# Patient Record
Sex: Female | Born: 1935 | Race: White | Hispanic: No | Marital: Married | Smoking: Former smoker
Health system: Southern US, Community
[De-identification: ages and names within clinical notes are randomized; demographics above are authoritative.]

## PROBLEM LIST (undated history)

## (undated) DIAGNOSIS — Z79899 Other long term (current) drug therapy: Secondary | ICD-10-CM

## (undated) DIAGNOSIS — Z950 Presence of cardiac pacemaker: Secondary | ICD-10-CM

## (undated) DIAGNOSIS — G4730 Sleep apnea, unspecified: Secondary | ICD-10-CM

## (undated) DIAGNOSIS — L299 Pruritus, unspecified: Secondary | ICD-10-CM

## (undated) DIAGNOSIS — D649 Anemia, unspecified: Secondary | ICD-10-CM

## (undated) DIAGNOSIS — K219 Gastro-esophageal reflux disease without esophagitis: Secondary | ICD-10-CM

## (undated) DIAGNOSIS — K5790 Diverticulosis of intestine, part unspecified, without perforation or abscess without bleeding: Secondary | ICD-10-CM

## (undated) DIAGNOSIS — Z952 Presence of prosthetic heart valve: Secondary | ICD-10-CM

## (undated) DIAGNOSIS — F419 Anxiety disorder, unspecified: Secondary | ICD-10-CM

## (undated) DIAGNOSIS — I509 Heart failure, unspecified: Secondary | ICD-10-CM

## (undated) DIAGNOSIS — K589 Irritable bowel syndrome without diarrhea: Secondary | ICD-10-CM

## (undated) DIAGNOSIS — I10 Essential (primary) hypertension: Secondary | ICD-10-CM

## (undated) DIAGNOSIS — F329 Major depressive disorder, single episode, unspecified: Secondary | ICD-10-CM

## (undated) DIAGNOSIS — J449 Chronic obstructive pulmonary disease, unspecified: Secondary | ICD-10-CM

## (undated) DIAGNOSIS — Z7901 Long term (current) use of anticoagulants: Secondary | ICD-10-CM

## (undated) DIAGNOSIS — T8859XA Other complications of anesthesia, initial encounter: Secondary | ICD-10-CM

## (undated) DIAGNOSIS — J45909 Unspecified asthma, uncomplicated: Secondary | ICD-10-CM

## (undated) DIAGNOSIS — Z9981 Dependence on supplemental oxygen: Secondary | ICD-10-CM

## (undated) DIAGNOSIS — T4145XA Adverse effect of unspecified anesthetic, initial encounter: Secondary | ICD-10-CM

## (undated) DIAGNOSIS — M1990 Unspecified osteoarthritis, unspecified site: Secondary | ICD-10-CM

## (undated) DIAGNOSIS — I2510 Atherosclerotic heart disease of native coronary artery without angina pectoris: Secondary | ICD-10-CM

## (undated) DIAGNOSIS — Z515 Encounter for palliative care: Secondary | ICD-10-CM

## (undated) DIAGNOSIS — J189 Pneumonia, unspecified organism: Secondary | ICD-10-CM

## (undated) DIAGNOSIS — I4892 Unspecified atrial flutter: Secondary | ICD-10-CM

## (undated) DIAGNOSIS — I4891 Unspecified atrial fibrillation: Secondary | ICD-10-CM

## (undated) HISTORY — PX: CORONARY ARTERY BYPASS GRAFT: SHX141

## (undated) HISTORY — PX: CARDIAC ELECTROPHYSIOLOGY MAPPING AND ABLATION: SHX1292

## (undated) HISTORY — PX: CARDIAC VALVE REPLACEMENT: SHX585

## (undated) HISTORY — PX: EYE SURGERY: SHX253

## (undated) HISTORY — DX: Sleep apnea, unspecified: G47.30

## (undated) HISTORY — PX: OTHER SURGICAL HISTORY: SHX169

## (undated) HISTORY — DX: Unspecified atrial fibrillation: I48.91

## (undated) HISTORY — DX: Pruritus, unspecified: L29.9

## (undated) HISTORY — DX: Presence of prosthetic heart valve: Z95.2

## (undated) HISTORY — DX: Presence of cardiac pacemaker: Z95.0

## (undated) HISTORY — PX: TONSILLECTOMY: SUR1361

## (undated) HISTORY — PX: CORONARY ANGIOPLASTY: SHX604

## (undated) HISTORY — PX: CARDIAC CATHETERIZATION: SHX172

## (undated) HISTORY — DX: Long term (current) use of anticoagulants: Z79.01

## (undated) HISTORY — DX: Dependence on supplemental oxygen: Z99.81

## (undated) HISTORY — DX: Irritable bowel syndrome, unspecified: K58.9

## (undated) HISTORY — PX: INSERT / REPLACE / REMOVE PACEMAKER: SUR710

## (undated) HISTORY — DX: Gastro-esophageal reflux disease without esophagitis: K21.9

## (undated) HISTORY — DX: Unspecified asthma, uncomplicated: J45.909

## (undated) HISTORY — DX: Unspecified atrial flutter: I48.92

## (undated) HISTORY — PX: GANGLION CYST EXCISION: SHX1691

## (undated) HISTORY — DX: Other long term (current) drug therapy: Z79.899

## (undated) HISTORY — DX: Diverticulosis of intestine, part unspecified, without perforation or abscess without bleeding: K57.90

## (undated) HISTORY — PX: APPENDECTOMY: SHX54

---

## 1992-11-05 HISTORY — PX: BACK SURGERY: SHX140

## 1996-03-13 ENCOUNTER — Encounter: Payer: Self-pay | Admitting: Internal Medicine

## 1998-02-03 ENCOUNTER — Encounter (HOSPITAL_COMMUNITY): Admission: RE | Admit: 1998-02-03 | Discharge: 1998-05-04 | Payer: Self-pay | Admitting: Internal Medicine

## 1998-05-05 ENCOUNTER — Encounter (HOSPITAL_COMMUNITY): Admission: RE | Admit: 1998-05-05 | Discharge: 1998-08-03 | Payer: Self-pay | Admitting: Internal Medicine

## 1998-08-04 ENCOUNTER — Encounter (HOSPITAL_COMMUNITY): Admission: RE | Admit: 1998-08-04 | Discharge: 1998-11-02 | Payer: Self-pay | Admitting: Internal Medicine

## 1998-10-10 ENCOUNTER — Other Ambulatory Visit: Admission: RE | Admit: 1998-10-10 | Discharge: 1998-10-10 | Payer: Self-pay | Admitting: *Deleted

## 1998-11-03 ENCOUNTER — Encounter (HOSPITAL_COMMUNITY): Admission: RE | Admit: 1998-11-03 | Discharge: 1999-02-01 | Payer: Self-pay | Admitting: Internal Medicine

## 1998-11-03 ENCOUNTER — Ambulatory Visit (HOSPITAL_COMMUNITY): Admission: RE | Admit: 1998-11-03 | Discharge: 1998-11-03 | Payer: Self-pay | Admitting: *Deleted

## 1998-11-03 ENCOUNTER — Encounter: Payer: Self-pay | Admitting: *Deleted

## 1999-02-02 ENCOUNTER — Encounter (HOSPITAL_COMMUNITY): Admission: RE | Admit: 1999-02-02 | Discharge: 1999-05-03 | Payer: Self-pay | Admitting: Internal Medicine

## 1999-02-09 ENCOUNTER — Encounter: Payer: Self-pay | Admitting: Internal Medicine

## 1999-05-04 ENCOUNTER — Encounter (HOSPITAL_COMMUNITY): Admission: RE | Admit: 1999-05-04 | Discharge: 1999-06-05 | Payer: Self-pay | Admitting: Internal Medicine

## 1999-06-06 ENCOUNTER — Encounter (HOSPITAL_COMMUNITY): Admission: RE | Admit: 1999-06-06 | Discharge: 1999-09-04 | Payer: Self-pay | Admitting: Internal Medicine

## 1999-09-05 ENCOUNTER — Encounter (HOSPITAL_COMMUNITY): Admission: RE | Admit: 1999-09-05 | Discharge: 1999-12-04 | Payer: Self-pay | Admitting: Internal Medicine

## 1999-10-18 ENCOUNTER — Other Ambulatory Visit: Admission: RE | Admit: 1999-10-18 | Discharge: 1999-10-18 | Payer: Self-pay | Admitting: *Deleted

## 1999-11-15 ENCOUNTER — Ambulatory Visit (HOSPITAL_COMMUNITY): Admission: RE | Admit: 1999-11-15 | Discharge: 1999-11-15 | Payer: Self-pay | Admitting: *Deleted

## 1999-11-15 ENCOUNTER — Encounter: Payer: Self-pay | Admitting: *Deleted

## 1999-12-05 ENCOUNTER — Encounter (HOSPITAL_COMMUNITY): Admission: RE | Admit: 1999-12-05 | Discharge: 2000-03-04 | Payer: Self-pay | Admitting: Internal Medicine

## 2000-01-25 ENCOUNTER — Encounter: Payer: Self-pay | Admitting: Internal Medicine

## 2000-03-05 ENCOUNTER — Encounter (HOSPITAL_COMMUNITY): Admission: RE | Admit: 2000-03-05 | Discharge: 2000-04-16 | Payer: Self-pay | Admitting: Internal Medicine

## 2000-10-21 ENCOUNTER — Other Ambulatory Visit: Admission: RE | Admit: 2000-10-21 | Discharge: 2000-10-21 | Payer: Self-pay | Admitting: *Deleted

## 2001-01-08 ENCOUNTER — Ambulatory Visit (HOSPITAL_COMMUNITY): Admission: RE | Admit: 2001-01-08 | Discharge: 2001-01-08 | Payer: Self-pay | Admitting: *Deleted

## 2001-01-08 ENCOUNTER — Encounter: Payer: Self-pay | Admitting: *Deleted

## 2001-01-13 ENCOUNTER — Encounter: Admission: RE | Admit: 2001-01-13 | Discharge: 2001-01-13 | Payer: Self-pay | Admitting: *Deleted

## 2001-01-13 ENCOUNTER — Encounter: Payer: Self-pay | Admitting: *Deleted

## 2001-08-25 ENCOUNTER — Encounter: Payer: Self-pay | Admitting: Internal Medicine

## 2001-08-25 ENCOUNTER — Ambulatory Visit (HOSPITAL_COMMUNITY): Admission: RE | Admit: 2001-08-25 | Discharge: 2001-08-25 | Payer: Self-pay | Admitting: Internal Medicine

## 2002-01-19 ENCOUNTER — Encounter: Admission: RE | Admit: 2002-01-19 | Discharge: 2002-01-19 | Payer: Self-pay | Admitting: *Deleted

## 2002-01-19 ENCOUNTER — Encounter: Payer: Self-pay | Admitting: *Deleted

## 2002-02-10 ENCOUNTER — Encounter: Payer: Self-pay | Admitting: Neurosurgery

## 2002-02-10 ENCOUNTER — Ambulatory Visit (HOSPITAL_COMMUNITY): Admission: RE | Admit: 2002-02-10 | Discharge: 2002-02-10 | Payer: Self-pay | Admitting: Neurosurgery

## 2002-06-16 ENCOUNTER — Inpatient Hospital Stay (HOSPITAL_COMMUNITY): Admission: EM | Admit: 2002-06-16 | Discharge: 2002-06-23 | Payer: Self-pay | Admitting: Internal Medicine

## 2002-06-16 ENCOUNTER — Encounter: Payer: Self-pay | Admitting: Internal Medicine

## 2002-06-17 ENCOUNTER — Encounter: Payer: Self-pay | Admitting: Internal Medicine

## 2002-06-18 ENCOUNTER — Encounter: Payer: Self-pay | Admitting: Cardiology

## 2002-06-18 ENCOUNTER — Encounter: Payer: Self-pay | Admitting: Internal Medicine

## 2002-06-19 ENCOUNTER — Encounter: Payer: Self-pay | Admitting: Internal Medicine

## 2002-06-22 ENCOUNTER — Encounter: Payer: Self-pay | Admitting: Internal Medicine

## 2002-08-03 ENCOUNTER — Ambulatory Visit (HOSPITAL_BASED_OUTPATIENT_CLINIC_OR_DEPARTMENT_OTHER): Admission: RE | Admit: 2002-08-03 | Discharge: 2002-08-03 | Payer: Self-pay | Admitting: Internal Medicine

## 2002-08-24 ENCOUNTER — Encounter: Payer: Self-pay | Admitting: Obstetrics and Gynecology

## 2002-08-24 ENCOUNTER — Encounter: Admission: RE | Admit: 2002-08-24 | Discharge: 2002-08-24 | Payer: Self-pay | Admitting: Obstetrics and Gynecology

## 2002-09-07 ENCOUNTER — Encounter: Admission: RE | Admit: 2002-09-07 | Discharge: 2002-09-07 | Payer: Self-pay | Admitting: Obstetrics and Gynecology

## 2002-09-07 ENCOUNTER — Encounter: Payer: Self-pay | Admitting: Obstetrics and Gynecology

## 2002-11-10 ENCOUNTER — Ambulatory Visit (HOSPITAL_COMMUNITY): Admission: RE | Admit: 2002-11-10 | Discharge: 2002-11-10 | Payer: Self-pay | Admitting: Cardiology

## 2003-08-06 ENCOUNTER — Encounter (HOSPITAL_COMMUNITY): Admission: RE | Admit: 2003-08-06 | Discharge: 2003-10-06 | Payer: Self-pay | Admitting: Internal Medicine

## 2003-10-08 ENCOUNTER — Inpatient Hospital Stay (HOSPITAL_COMMUNITY): Admission: AD | Admit: 2003-10-08 | Discharge: 2003-10-12 | Payer: Self-pay | Admitting: Internal Medicine

## 2003-10-19 ENCOUNTER — Encounter: Admission: RE | Admit: 2003-10-19 | Discharge: 2003-10-19 | Payer: Self-pay | Admitting: Obstetrics and Gynecology

## 2003-10-25 ENCOUNTER — Ambulatory Visit (HOSPITAL_COMMUNITY): Admission: RE | Admit: 2003-10-25 | Discharge: 2003-10-25 | Payer: Self-pay | Admitting: Pulmonary Disease

## 2003-11-09 ENCOUNTER — Encounter: Payer: Self-pay | Admitting: Internal Medicine

## 2003-12-02 ENCOUNTER — Encounter (HOSPITAL_COMMUNITY): Admission: RE | Admit: 2003-12-02 | Discharge: 2004-03-01 | Payer: Self-pay | Admitting: Internal Medicine

## 2004-01-11 ENCOUNTER — Ambulatory Visit (HOSPITAL_COMMUNITY): Admission: RE | Admit: 2004-01-11 | Discharge: 2004-01-11 | Payer: Self-pay | Admitting: Pulmonary Disease

## 2004-03-05 ENCOUNTER — Encounter (HOSPITAL_COMMUNITY): Admission: RE | Admit: 2004-03-05 | Discharge: 2004-06-03 | Payer: Self-pay | Admitting: Internal Medicine

## 2004-06-06 ENCOUNTER — Encounter (HOSPITAL_COMMUNITY): Admission: RE | Admit: 2004-06-06 | Discharge: 2004-09-04 | Payer: Self-pay | Admitting: Internal Medicine

## 2004-09-05 ENCOUNTER — Encounter (HOSPITAL_COMMUNITY): Admission: RE | Admit: 2004-09-05 | Discharge: 2004-12-04 | Payer: Self-pay | Admitting: Internal Medicine

## 2004-09-05 ENCOUNTER — Ambulatory Visit: Payer: Self-pay | Admitting: Internal Medicine

## 2004-09-11 ENCOUNTER — Encounter: Admission: RE | Admit: 2004-09-11 | Discharge: 2004-09-11 | Payer: Self-pay | Admitting: Internal Medicine

## 2004-10-25 ENCOUNTER — Encounter: Admission: RE | Admit: 2004-10-25 | Discharge: 2004-10-25 | Payer: Self-pay | Admitting: Internal Medicine

## 2004-11-02 ENCOUNTER — Ambulatory Visit: Payer: Self-pay | Admitting: Internal Medicine

## 2004-11-05 HISTORY — PX: AORTIC VALVE REPLACEMENT: SHX41

## 2004-11-10 ENCOUNTER — Ambulatory Visit: Payer: Self-pay | Admitting: Pulmonary Disease

## 2004-11-17 ENCOUNTER — Ambulatory Visit: Payer: Self-pay | Admitting: Pulmonary Disease

## 2004-12-06 ENCOUNTER — Encounter (HOSPITAL_COMMUNITY): Admission: RE | Admit: 2004-12-06 | Discharge: 2005-03-06 | Payer: Self-pay | Admitting: Internal Medicine

## 2004-12-06 ENCOUNTER — Ambulatory Visit: Payer: Self-pay | Admitting: Pulmonary Disease

## 2004-12-12 ENCOUNTER — Inpatient Hospital Stay (HOSPITAL_COMMUNITY): Admission: AD | Admit: 2004-12-12 | Discharge: 2004-12-22 | Payer: Self-pay | Admitting: Cardiology

## 2004-12-12 ENCOUNTER — Ambulatory Visit: Payer: Self-pay | Admitting: Pulmonary Disease

## 2005-01-29 ENCOUNTER — Ambulatory Visit: Payer: Self-pay | Admitting: Pulmonary Disease

## 2005-02-08 ENCOUNTER — Ambulatory Visit (HOSPITAL_COMMUNITY): Admission: RE | Admit: 2005-02-08 | Discharge: 2005-02-08 | Payer: Self-pay | Admitting: Cardiology

## 2005-02-20 ENCOUNTER — Inpatient Hospital Stay (HOSPITAL_COMMUNITY): Admission: RE | Admit: 2005-02-20 | Discharge: 2005-03-13 | Payer: Self-pay | Admitting: Cardiology

## 2005-02-23 ENCOUNTER — Encounter (INDEPENDENT_AMBULATORY_CARE_PROVIDER_SITE_OTHER): Payer: Self-pay | Admitting: *Deleted

## 2005-02-27 ENCOUNTER — Encounter: Payer: Self-pay | Admitting: Thoracic Surgery (Cardiothoracic Vascular Surgery)

## 2005-03-04 ENCOUNTER — Ambulatory Visit: Payer: Self-pay | Admitting: Internal Medicine

## 2005-03-28 ENCOUNTER — Ambulatory Visit: Payer: Self-pay | Admitting: Pulmonary Disease

## 2005-04-13 ENCOUNTER — Ambulatory Visit: Payer: Self-pay | Admitting: Critical Care Medicine

## 2005-04-27 ENCOUNTER — Ambulatory Visit: Payer: Self-pay | Admitting: Pulmonary Disease

## 2005-05-21 ENCOUNTER — Ambulatory Visit (HOSPITAL_BASED_OUTPATIENT_CLINIC_OR_DEPARTMENT_OTHER): Admission: RE | Admit: 2005-05-21 | Discharge: 2005-05-21 | Payer: Self-pay | Admitting: Pulmonary Disease

## 2005-05-28 ENCOUNTER — Ambulatory Visit: Payer: Self-pay | Admitting: Pulmonary Disease

## 2005-05-29 ENCOUNTER — Ambulatory Visit (HOSPITAL_COMMUNITY): Admission: RE | Admit: 2005-05-29 | Discharge: 2005-05-29 | Payer: Self-pay | Admitting: Cardiology

## 2005-06-26 ENCOUNTER — Ambulatory Visit: Payer: Self-pay | Admitting: Internal Medicine

## 2005-06-28 ENCOUNTER — Encounter (HOSPITAL_COMMUNITY): Admission: RE | Admit: 2005-06-28 | Discharge: 2005-09-26 | Payer: Self-pay | Admitting: Cardiology

## 2005-07-02 ENCOUNTER — Ambulatory Visit: Payer: Self-pay | Admitting: Pulmonary Disease

## 2005-07-11 ENCOUNTER — Ambulatory Visit: Payer: Self-pay | Admitting: Critical Care Medicine

## 2005-07-26 ENCOUNTER — Ambulatory Visit: Payer: Self-pay | Admitting: Pulmonary Disease

## 2005-07-27 ENCOUNTER — Ambulatory Visit: Payer: Self-pay | Admitting: Cardiology

## 2005-08-09 ENCOUNTER — Ambulatory Visit: Payer: Self-pay | Admitting: Internal Medicine

## 2005-08-23 ENCOUNTER — Ambulatory Visit: Payer: Self-pay | Admitting: Pulmonary Disease

## 2005-09-10 ENCOUNTER — Ambulatory Visit: Payer: Self-pay | Admitting: Pulmonary Disease

## 2005-09-18 ENCOUNTER — Ambulatory Visit: Payer: Self-pay | Admitting: Internal Medicine

## 2005-09-27 ENCOUNTER — Encounter (HOSPITAL_COMMUNITY): Admission: RE | Admit: 2005-09-27 | Discharge: 2005-11-02 | Payer: Self-pay | Admitting: Cardiology

## 2005-10-16 ENCOUNTER — Ambulatory Visit: Payer: Self-pay | Admitting: Pulmonary Disease

## 2005-10-24 ENCOUNTER — Ambulatory Visit: Payer: Self-pay | Admitting: Internal Medicine

## 2005-11-02 ENCOUNTER — Ambulatory Visit: Payer: Self-pay | Admitting: Pulmonary Disease

## 2005-11-08 ENCOUNTER — Ambulatory Visit: Payer: Self-pay | Admitting: Pulmonary Disease

## 2005-11-14 ENCOUNTER — Ambulatory Visit: Payer: Self-pay | Admitting: Pulmonary Disease

## 2005-12-11 ENCOUNTER — Ambulatory Visit: Payer: Self-pay | Admitting: Pulmonary Disease

## 2006-01-07 ENCOUNTER — Ambulatory Visit: Payer: Self-pay | Admitting: Pulmonary Disease

## 2006-02-03 ENCOUNTER — Encounter (HOSPITAL_COMMUNITY): Admission: RE | Admit: 2006-02-03 | Discharge: 2006-05-04 | Payer: Self-pay | Admitting: Internal Medicine

## 2006-02-18 ENCOUNTER — Ambulatory Visit: Payer: Self-pay | Admitting: Pulmonary Disease

## 2006-03-07 ENCOUNTER — Encounter: Payer: Self-pay | Admitting: Vascular Surgery

## 2006-03-07 ENCOUNTER — Ambulatory Visit (HOSPITAL_COMMUNITY): Admission: RE | Admit: 2006-03-07 | Discharge: 2006-03-07 | Payer: Self-pay | Admitting: Cardiology

## 2006-03-27 ENCOUNTER — Ambulatory Visit: Payer: Self-pay | Admitting: Internal Medicine

## 2006-04-11 ENCOUNTER — Ambulatory Visit: Payer: Self-pay | Admitting: Internal Medicine

## 2006-05-07 ENCOUNTER — Encounter (HOSPITAL_COMMUNITY): Admission: RE | Admit: 2006-05-07 | Discharge: 2006-08-05 | Payer: Self-pay | Admitting: Pulmonary Disease

## 2006-05-15 ENCOUNTER — Ambulatory Visit: Payer: Self-pay | Admitting: Pulmonary Disease

## 2006-08-06 ENCOUNTER — Encounter (HOSPITAL_COMMUNITY): Admission: RE | Admit: 2006-08-06 | Discharge: 2006-11-04 | Payer: Self-pay | Admitting: Pulmonary Disease

## 2006-08-14 ENCOUNTER — Ambulatory Visit: Payer: Self-pay | Admitting: Pulmonary Disease

## 2006-08-15 ENCOUNTER — Ambulatory Visit: Payer: Self-pay | Admitting: Internal Medicine

## 2006-09-03 ENCOUNTER — Ambulatory Visit: Payer: Self-pay | Admitting: Internal Medicine

## 2006-09-11 ENCOUNTER — Ambulatory Visit: Payer: Self-pay | Admitting: Internal Medicine

## 2006-09-18 ENCOUNTER — Ambulatory Visit (HOSPITAL_COMMUNITY): Admission: RE | Admit: 2006-09-18 | Discharge: 2006-09-19 | Payer: Self-pay | Admitting: Internal Medicine

## 2006-09-18 ENCOUNTER — Ambulatory Visit: Payer: Self-pay | Admitting: Internal Medicine

## 2006-10-08 ENCOUNTER — Ambulatory Visit: Payer: Self-pay | Admitting: Pulmonary Disease

## 2006-10-17 ENCOUNTER — Ambulatory Visit: Payer: Self-pay | Admitting: Internal Medicine

## 2006-10-21 ENCOUNTER — Ambulatory Visit: Payer: Self-pay | Admitting: Internal Medicine

## 2006-11-05 ENCOUNTER — Encounter (HOSPITAL_COMMUNITY): Admission: RE | Admit: 2006-11-05 | Discharge: 2007-02-03 | Payer: Self-pay | Admitting: Pulmonary Disease

## 2006-12-03 ENCOUNTER — Ambulatory Visit: Payer: Self-pay | Admitting: Internal Medicine

## 2006-12-18 ENCOUNTER — Ambulatory Visit: Payer: Self-pay | Admitting: Internal Medicine

## 2007-01-08 ENCOUNTER — Ambulatory Visit: Payer: Self-pay | Admitting: Pulmonary Disease

## 2007-01-31 ENCOUNTER — Ambulatory Visit: Payer: Self-pay | Admitting: Pulmonary Disease

## 2007-02-04 ENCOUNTER — Encounter (HOSPITAL_COMMUNITY): Admission: RE | Admit: 2007-02-04 | Discharge: 2007-05-05 | Payer: Self-pay | Admitting: Pulmonary Disease

## 2007-02-13 ENCOUNTER — Ambulatory Visit: Payer: Self-pay | Admitting: Internal Medicine

## 2007-03-06 ENCOUNTER — Ambulatory Visit: Payer: Self-pay | Admitting: Pulmonary Disease

## 2007-03-28 ENCOUNTER — Ambulatory Visit: Payer: Self-pay | Admitting: Internal Medicine

## 2007-05-02 ENCOUNTER — Ambulatory Visit: Payer: Self-pay | Admitting: Internal Medicine

## 2007-05-05 ENCOUNTER — Encounter: Payer: Self-pay | Admitting: Internal Medicine

## 2007-05-06 ENCOUNTER — Encounter (HOSPITAL_COMMUNITY): Admission: RE | Admit: 2007-05-06 | Discharge: 2007-08-04 | Payer: Self-pay | Admitting: Surgical Oncology

## 2007-05-12 ENCOUNTER — Ambulatory Visit: Payer: Self-pay | Admitting: Internal Medicine

## 2007-05-21 ENCOUNTER — Ambulatory Visit: Payer: Self-pay | Admitting: Internal Medicine

## 2007-05-21 DIAGNOSIS — L299 Pruritus, unspecified: Secondary | ICD-10-CM | POA: Insufficient documentation

## 2007-05-21 DIAGNOSIS — R5381 Other malaise: Secondary | ICD-10-CM

## 2007-05-21 DIAGNOSIS — R5383 Other fatigue: Secondary | ICD-10-CM

## 2007-05-23 ENCOUNTER — Encounter (INDEPENDENT_AMBULATORY_CARE_PROVIDER_SITE_OTHER): Payer: Self-pay | Admitting: *Deleted

## 2007-05-23 LAB — CONVERTED CEMR LAB
Basophils Absolute: 0 10*3/uL (ref 0.0–0.1)
Eosinophils Absolute: 0.1 10*3/uL (ref 0.0–0.6)
Eosinophils Relative: 1.1 % (ref 0.0–5.0)
Free T4: 0.6 ng/dL (ref 0.6–1.6)
HCT: 36.6 % (ref 36.0–46.0)
Hemoglobin: 12.4 g/dL (ref 12.0–15.0)
Lymphocytes Relative: 8.8 % — ABNORMAL LOW (ref 12.0–46.0)
MCHC: 33.9 g/dL (ref 30.0–36.0)
MCV: 81 fL (ref 78.0–100.0)
Monocytes Absolute: 0.8 10*3/uL — ABNORMAL HIGH (ref 0.2–0.7)
Neutrophils Relative %: 81.8 % — ABNORMAL HIGH (ref 43.0–77.0)
RBC: 4.51 M/uL (ref 3.87–5.11)
TSH: 1.61 microintl units/mL (ref 0.35–5.50)
WBC: 9.8 10*3/uL (ref 4.5–10.5)

## 2007-05-30 ENCOUNTER — Encounter (INDEPENDENT_AMBULATORY_CARE_PROVIDER_SITE_OTHER): Payer: Self-pay | Admitting: *Deleted

## 2007-06-04 ENCOUNTER — Encounter: Payer: Self-pay | Admitting: Internal Medicine

## 2007-06-16 ENCOUNTER — Encounter (INDEPENDENT_AMBULATORY_CARE_PROVIDER_SITE_OTHER): Payer: Self-pay | Admitting: *Deleted

## 2007-08-01 ENCOUNTER — Ambulatory Visit: Payer: Self-pay | Admitting: Internal Medicine

## 2007-08-05 ENCOUNTER — Ambulatory Visit: Payer: Self-pay | Admitting: Internal Medicine

## 2007-08-05 DIAGNOSIS — M810 Age-related osteoporosis without current pathological fracture: Secondary | ICD-10-CM | POA: Insufficient documentation

## 2007-08-06 ENCOUNTER — Encounter (HOSPITAL_COMMUNITY): Admission: RE | Admit: 2007-08-06 | Discharge: 2007-11-04 | Payer: Self-pay | Admitting: Surgical Oncology

## 2007-08-11 ENCOUNTER — Encounter: Payer: Self-pay | Admitting: Internal Medicine

## 2007-09-17 ENCOUNTER — Ambulatory Visit: Payer: Self-pay | Admitting: Internal Medicine

## 2007-10-17 ENCOUNTER — Encounter: Payer: Self-pay | Admitting: Internal Medicine

## 2007-10-17 ENCOUNTER — Encounter: Admission: RE | Admit: 2007-10-17 | Discharge: 2007-10-17 | Payer: Self-pay | Admitting: Cardiology

## 2007-10-20 ENCOUNTER — Ambulatory Visit: Payer: Self-pay | Admitting: Surgery

## 2007-10-23 ENCOUNTER — Ambulatory Visit: Payer: Self-pay | Admitting: Internal Medicine

## 2007-10-23 DIAGNOSIS — J449 Chronic obstructive pulmonary disease, unspecified: Secondary | ICD-10-CM

## 2007-10-23 DIAGNOSIS — J4489 Other specified chronic obstructive pulmonary disease: Secondary | ICD-10-CM | POA: Insufficient documentation

## 2007-10-25 DIAGNOSIS — I4891 Unspecified atrial fibrillation: Secondary | ICD-10-CM

## 2007-10-25 DIAGNOSIS — G4730 Sleep apnea, unspecified: Secondary | ICD-10-CM | POA: Insufficient documentation

## 2007-10-25 LAB — CONVERTED CEMR LAB
Basophils Relative: 0.2 % (ref 0.0–1.0)
HCT: 37.9 % (ref 36.0–46.0)
Hemoglobin: 13.1 g/dL (ref 12.0–15.0)
Lymphocytes Relative: 4.5 % — ABNORMAL LOW (ref 12.0–46.0)
MCHC: 34.6 g/dL (ref 30.0–36.0)
Monocytes Absolute: 0.3 10*3/uL (ref 0.2–0.7)
Neutro Abs: 12.1 10*3/uL — ABNORMAL HIGH (ref 1.4–7.7)
Neutrophils Relative %: 92.5 % — ABNORMAL HIGH (ref 43.0–77.0)
RDW: 15 % — ABNORMAL HIGH (ref 11.5–14.6)

## 2007-10-28 ENCOUNTER — Ambulatory Visit: Payer: Self-pay | Admitting: Internal Medicine

## 2007-11-02 LAB — CONVERTED CEMR LAB: Magnesium: 2.1 mg/dL (ref 1.5–2.5)

## 2007-11-03 ENCOUNTER — Ambulatory Visit: Payer: Self-pay | Admitting: Internal Medicine

## 2007-11-03 ENCOUNTER — Encounter (INDEPENDENT_AMBULATORY_CARE_PROVIDER_SITE_OTHER): Payer: Self-pay | Admitting: *Deleted

## 2007-11-03 ENCOUNTER — Telehealth (INDEPENDENT_AMBULATORY_CARE_PROVIDER_SITE_OTHER): Payer: Self-pay | Admitting: *Deleted

## 2007-11-06 ENCOUNTER — Encounter (HOSPITAL_COMMUNITY): Admission: RE | Admit: 2007-11-06 | Discharge: 2007-12-05 | Payer: Self-pay | Admitting: Surgical Oncology

## 2007-11-18 ENCOUNTER — Ambulatory Visit: Payer: Self-pay | Admitting: Internal Medicine

## 2007-11-18 ENCOUNTER — Ambulatory Visit: Payer: Self-pay | Admitting: Pulmonary Disease

## 2007-11-18 DIAGNOSIS — J209 Acute bronchitis, unspecified: Secondary | ICD-10-CM

## 2007-11-25 ENCOUNTER — Ambulatory Visit: Payer: Self-pay | Admitting: Pulmonary Disease

## 2007-12-07 ENCOUNTER — Encounter (HOSPITAL_COMMUNITY): Admission: RE | Admit: 2007-12-07 | Discharge: 2008-01-06 | Payer: Self-pay | Admitting: Surgical Oncology

## 2007-12-17 ENCOUNTER — Ambulatory Visit: Payer: Self-pay | Admitting: Internal Medicine

## 2007-12-18 ENCOUNTER — Ambulatory Visit: Payer: Self-pay | Admitting: Internal Medicine

## 2007-12-19 ENCOUNTER — Ambulatory Visit: Payer: Self-pay | Admitting: Internal Medicine

## 2007-12-22 ENCOUNTER — Encounter: Payer: Self-pay | Admitting: Internal Medicine

## 2007-12-23 ENCOUNTER — Ambulatory Visit: Payer: Self-pay | Admitting: Internal Medicine

## 2007-12-29 ENCOUNTER — Ambulatory Visit: Payer: Self-pay | Admitting: Internal Medicine

## 2008-01-02 ENCOUNTER — Ambulatory Visit: Payer: Self-pay | Admitting: Internal Medicine

## 2008-01-06 ENCOUNTER — Ambulatory Visit: Payer: Self-pay | Admitting: Internal Medicine

## 2008-01-09 ENCOUNTER — Ambulatory Visit: Payer: Self-pay | Admitting: Internal Medicine

## 2008-01-13 ENCOUNTER — Ambulatory Visit: Payer: Self-pay | Admitting: Internal Medicine

## 2008-01-14 ENCOUNTER — Ambulatory Visit: Payer: Self-pay | Admitting: Internal Medicine

## 2008-01-16 ENCOUNTER — Ambulatory Visit: Payer: Self-pay | Admitting: Internal Medicine

## 2008-01-20 ENCOUNTER — Ambulatory Visit: Payer: Self-pay | Admitting: Internal Medicine

## 2008-01-23 ENCOUNTER — Ambulatory Visit: Payer: Self-pay | Admitting: Internal Medicine

## 2008-01-27 ENCOUNTER — Ambulatory Visit: Payer: Self-pay | Admitting: Internal Medicine

## 2008-01-30 ENCOUNTER — Ambulatory Visit: Payer: Self-pay | Admitting: Internal Medicine

## 2008-02-02 ENCOUNTER — Telehealth (INDEPENDENT_AMBULATORY_CARE_PROVIDER_SITE_OTHER): Payer: Self-pay | Admitting: *Deleted

## 2008-02-02 ENCOUNTER — Emergency Department (HOSPITAL_COMMUNITY): Admission: EM | Admit: 2008-02-02 | Discharge: 2008-02-02 | Payer: Self-pay | Admitting: Emergency Medicine

## 2008-02-05 ENCOUNTER — Ambulatory Visit: Payer: Self-pay | Admitting: Internal Medicine

## 2008-02-05 DIAGNOSIS — L0291 Cutaneous abscess, unspecified: Secondary | ICD-10-CM

## 2008-02-05 DIAGNOSIS — K5289 Other specified noninfective gastroenteritis and colitis: Secondary | ICD-10-CM

## 2008-02-05 DIAGNOSIS — L0390 Cellulitis, unspecified: Secondary | ICD-10-CM

## 2008-02-05 DIAGNOSIS — K589 Irritable bowel syndrome without diarrhea: Secondary | ICD-10-CM

## 2008-02-09 ENCOUNTER — Ambulatory Visit: Payer: Self-pay | Admitting: Internal Medicine

## 2008-02-12 ENCOUNTER — Ambulatory Visit: Payer: Self-pay | Admitting: Internal Medicine

## 2008-02-16 ENCOUNTER — Encounter: Payer: Self-pay | Admitting: Internal Medicine

## 2008-02-16 ENCOUNTER — Telehealth (INDEPENDENT_AMBULATORY_CARE_PROVIDER_SITE_OTHER): Payer: Self-pay | Admitting: *Deleted

## 2008-02-16 ENCOUNTER — Ambulatory Visit: Payer: Self-pay | Admitting: Internal Medicine

## 2008-02-17 ENCOUNTER — Ambulatory Visit: Payer: Self-pay | Admitting: Internal Medicine

## 2008-02-18 ENCOUNTER — Encounter (INDEPENDENT_AMBULATORY_CARE_PROVIDER_SITE_OTHER): Payer: Self-pay | Admitting: *Deleted

## 2008-02-19 ENCOUNTER — Ambulatory Visit: Payer: Self-pay | Admitting: Internal Medicine

## 2008-02-20 ENCOUNTER — Ambulatory Visit: Payer: Self-pay | Admitting: Internal Medicine

## 2008-02-22 LAB — CONVERTED CEMR LAB
Basophils Relative: 4.1 % — ABNORMAL HIGH (ref 0.0–1.0)
Eosinophils Absolute: 0.1 10*3/uL (ref 0.0–0.7)
Eosinophils Relative: 0.5 % (ref 0.0–5.0)
HCT: 36.3 % (ref 36.0–46.0)
Hemoglobin: 11.8 g/dL — ABNORMAL LOW (ref 12.0–15.0)
MCV: 76 fL — ABNORMAL LOW (ref 78.0–100.0)
Monocytes Absolute: 0.2 10*3/uL (ref 0.1–1.0)
Monocytes Relative: 1.5 % — ABNORMAL LOW (ref 3.0–12.0)
Neutro Abs: 11.4 10*3/uL — ABNORMAL HIGH (ref 1.4–7.7)
WBC: 12.7 10*3/uL — ABNORMAL HIGH (ref 4.5–10.5)

## 2008-02-23 ENCOUNTER — Ambulatory Visit: Payer: Self-pay | Admitting: Internal Medicine

## 2008-02-23 ENCOUNTER — Encounter (INDEPENDENT_AMBULATORY_CARE_PROVIDER_SITE_OTHER): Payer: Self-pay | Admitting: *Deleted

## 2008-02-23 LAB — CONVERTED CEMR LAB: OCCULT 3: NEGATIVE

## 2008-02-26 ENCOUNTER — Ambulatory Visit: Payer: Self-pay | Admitting: Internal Medicine

## 2008-03-01 ENCOUNTER — Ambulatory Visit: Payer: Self-pay | Admitting: Internal Medicine

## 2008-03-04 ENCOUNTER — Ambulatory Visit: Payer: Self-pay | Admitting: Internal Medicine

## 2008-03-08 ENCOUNTER — Ambulatory Visit: Payer: Self-pay | Admitting: Internal Medicine

## 2008-03-09 ENCOUNTER — Ambulatory Visit: Payer: Self-pay | Admitting: Internal Medicine

## 2008-03-12 ENCOUNTER — Ambulatory Visit: Payer: Self-pay | Admitting: Internal Medicine

## 2008-03-15 ENCOUNTER — Ambulatory Visit: Payer: Self-pay | Admitting: Internal Medicine

## 2008-03-19 ENCOUNTER — Ambulatory Visit: Payer: Self-pay | Admitting: Internal Medicine

## 2008-03-22 ENCOUNTER — Ambulatory Visit: Payer: Self-pay | Admitting: Internal Medicine

## 2008-03-23 ENCOUNTER — Ambulatory Visit: Payer: Self-pay | Admitting: Internal Medicine

## 2008-03-25 ENCOUNTER — Telehealth (INDEPENDENT_AMBULATORY_CARE_PROVIDER_SITE_OTHER): Payer: Self-pay | Admitting: *Deleted

## 2008-03-25 ENCOUNTER — Ambulatory Visit: Payer: Self-pay | Admitting: Internal Medicine

## 2008-03-25 DIAGNOSIS — R0602 Shortness of breath: Secondary | ICD-10-CM

## 2008-03-30 ENCOUNTER — Ambulatory Visit: Payer: Self-pay | Admitting: Internal Medicine

## 2008-04-01 ENCOUNTER — Ambulatory Visit: Payer: Self-pay | Admitting: Internal Medicine

## 2008-04-02 ENCOUNTER — Encounter: Payer: Self-pay | Admitting: Internal Medicine

## 2008-04-12 ENCOUNTER — Ambulatory Visit: Payer: Self-pay | Admitting: Internal Medicine

## 2008-04-15 ENCOUNTER — Encounter (INDEPENDENT_AMBULATORY_CARE_PROVIDER_SITE_OTHER): Payer: Self-pay | Admitting: *Deleted

## 2008-04-15 ENCOUNTER — Ambulatory Visit (HOSPITAL_COMMUNITY): Admission: RE | Admit: 2008-04-15 | Discharge: 2008-04-15 | Payer: Self-pay | Admitting: Cardiovascular Disease

## 2008-04-16 ENCOUNTER — Ambulatory Visit: Payer: Self-pay | Admitting: Internal Medicine

## 2008-04-20 ENCOUNTER — Encounter: Payer: Self-pay | Admitting: Internal Medicine

## 2008-04-22 ENCOUNTER — Ambulatory Visit: Payer: Self-pay | Admitting: Internal Medicine

## 2008-04-29 ENCOUNTER — Ambulatory Visit: Payer: Self-pay | Admitting: Internal Medicine

## 2008-05-10 ENCOUNTER — Ambulatory Visit: Payer: Self-pay | Admitting: Internal Medicine

## 2008-05-17 ENCOUNTER — Encounter: Payer: Self-pay | Admitting: Internal Medicine

## 2008-05-19 ENCOUNTER — Ambulatory Visit: Payer: Self-pay | Admitting: Internal Medicine

## 2008-05-26 ENCOUNTER — Ambulatory Visit: Payer: Self-pay | Admitting: Internal Medicine

## 2008-06-02 ENCOUNTER — Ambulatory Visit: Payer: Self-pay | Admitting: Internal Medicine

## 2008-06-04 ENCOUNTER — Telehealth: Payer: Self-pay | Admitting: Internal Medicine

## 2008-06-09 ENCOUNTER — Ambulatory Visit: Payer: Self-pay | Admitting: Internal Medicine

## 2008-06-16 ENCOUNTER — Ambulatory Visit: Payer: Self-pay | Admitting: Internal Medicine

## 2008-06-23 ENCOUNTER — Ambulatory Visit: Payer: Self-pay | Admitting: Internal Medicine

## 2008-06-28 ENCOUNTER — Telehealth (INDEPENDENT_AMBULATORY_CARE_PROVIDER_SITE_OTHER): Payer: Self-pay | Admitting: *Deleted

## 2008-06-30 ENCOUNTER — Ambulatory Visit: Payer: Self-pay | Admitting: Internal Medicine

## 2008-07-05 ENCOUNTER — Telehealth (INDEPENDENT_AMBULATORY_CARE_PROVIDER_SITE_OTHER): Payer: Self-pay | Admitting: *Deleted

## 2008-07-07 ENCOUNTER — Ambulatory Visit: Payer: Self-pay | Admitting: Internal Medicine

## 2008-07-14 ENCOUNTER — Ambulatory Visit: Payer: Self-pay | Admitting: Internal Medicine

## 2008-07-16 ENCOUNTER — Ambulatory Visit: Payer: Self-pay | Admitting: Internal Medicine

## 2008-07-21 ENCOUNTER — Ambulatory Visit: Payer: Self-pay | Admitting: Pulmonary Disease

## 2008-07-21 ENCOUNTER — Ambulatory Visit: Payer: Self-pay | Admitting: Internal Medicine

## 2008-07-28 ENCOUNTER — Telehealth (INDEPENDENT_AMBULATORY_CARE_PROVIDER_SITE_OTHER): Payer: Self-pay | Admitting: *Deleted

## 2008-07-29 ENCOUNTER — Ambulatory Visit: Payer: Self-pay | Admitting: Internal Medicine

## 2008-07-29 ENCOUNTER — Telehealth: Payer: Self-pay | Admitting: Internal Medicine

## 2008-08-04 ENCOUNTER — Ambulatory Visit: Payer: Self-pay | Admitting: Internal Medicine

## 2008-08-11 ENCOUNTER — Ambulatory Visit: Payer: Self-pay | Admitting: Internal Medicine

## 2008-08-23 ENCOUNTER — Ambulatory Visit: Payer: Self-pay | Admitting: Internal Medicine

## 2008-08-30 ENCOUNTER — Ambulatory Visit: Payer: Self-pay | Admitting: Internal Medicine

## 2008-09-06 ENCOUNTER — Ambulatory Visit: Payer: Self-pay | Admitting: Internal Medicine

## 2008-09-10 ENCOUNTER — Ambulatory Visit: Payer: Self-pay | Admitting: Internal Medicine

## 2008-09-15 ENCOUNTER — Ambulatory Visit: Payer: Self-pay | Admitting: Internal Medicine

## 2008-09-21 ENCOUNTER — Ambulatory Visit: Payer: Self-pay | Admitting: Internal Medicine

## 2008-09-27 ENCOUNTER — Ambulatory Visit: Payer: Self-pay | Admitting: Internal Medicine

## 2008-10-06 ENCOUNTER — Ambulatory Visit: Payer: Self-pay | Admitting: Internal Medicine

## 2008-10-08 ENCOUNTER — Ambulatory Visit: Payer: Self-pay | Admitting: Internal Medicine

## 2008-10-11 ENCOUNTER — Ambulatory Visit: Payer: Self-pay | Admitting: Internal Medicine

## 2008-10-25 ENCOUNTER — Ambulatory Visit: Payer: Self-pay | Admitting: Internal Medicine

## 2008-11-03 ENCOUNTER — Ambulatory Visit: Payer: Self-pay | Admitting: Internal Medicine

## 2008-11-05 DIAGNOSIS — Z950 Presence of cardiac pacemaker: Secondary | ICD-10-CM

## 2008-11-05 HISTORY — PX: PACEMAKER INSERTION: SHX728

## 2008-11-05 HISTORY — DX: Presence of cardiac pacemaker: Z95.0

## 2008-11-10 ENCOUNTER — Ambulatory Visit: Payer: Self-pay | Admitting: Internal Medicine

## 2008-11-17 ENCOUNTER — Ambulatory Visit: Payer: Self-pay | Admitting: Internal Medicine

## 2008-11-25 ENCOUNTER — Ambulatory Visit: Payer: Self-pay | Admitting: Internal Medicine

## 2008-12-01 ENCOUNTER — Ambulatory Visit: Payer: Self-pay | Admitting: Internal Medicine

## 2008-12-02 ENCOUNTER — Ambulatory Visit: Payer: Self-pay | Admitting: Internal Medicine

## 2008-12-09 ENCOUNTER — Ambulatory Visit: Payer: Self-pay | Admitting: Internal Medicine

## 2008-12-15 ENCOUNTER — Ambulatory Visit: Payer: Self-pay | Admitting: Internal Medicine

## 2008-12-22 ENCOUNTER — Ambulatory Visit: Payer: Self-pay | Admitting: Internal Medicine

## 2008-12-28 ENCOUNTER — Ambulatory Visit: Payer: Self-pay | Admitting: *Deleted

## 2008-12-29 ENCOUNTER — Ambulatory Visit: Payer: Self-pay | Admitting: Internal Medicine

## 2008-12-30 ENCOUNTER — Ambulatory Visit: Payer: Self-pay | Admitting: Internal Medicine

## 2008-12-30 DIAGNOSIS — H6500 Acute serous otitis media, unspecified ear: Secondary | ICD-10-CM

## 2008-12-30 DIAGNOSIS — M531 Cervicobrachial syndrome: Secondary | ICD-10-CM

## 2008-12-30 DIAGNOSIS — D649 Anemia, unspecified: Secondary | ICD-10-CM

## 2009-01-03 ENCOUNTER — Telehealth: Payer: Self-pay | Admitting: Internal Medicine

## 2009-01-04 ENCOUNTER — Ambulatory Visit: Payer: Self-pay | Admitting: Internal Medicine

## 2009-01-04 ENCOUNTER — Encounter (INDEPENDENT_AMBULATORY_CARE_PROVIDER_SITE_OTHER): Payer: Self-pay | Admitting: *Deleted

## 2009-01-04 LAB — CONVERTED CEMR LAB
Basophils Relative: 0.2 % (ref 0.0–3.0)
Eosinophils Relative: 1.5 % (ref 0.0–5.0)
HCT: 40.2 % (ref 36.0–46.0)
Hemoglobin: 13.4 g/dL (ref 12.0–15.0)
Monocytes Absolute: 0.9 10*3/uL (ref 0.1–1.0)
Monocytes Relative: 8.4 % (ref 3.0–12.0)
Neutro Abs: 8.1 10*3/uL — ABNORMAL HIGH (ref 1.4–7.7)
Platelets: 182 10*3/uL (ref 150–400)
RBC: 4.91 M/uL (ref 3.87–5.11)
Transferrin: 334.2 mg/dL (ref 212.0–360.0)
WBC: 10.3 10*3/uL (ref 4.5–10.5)

## 2009-01-05 ENCOUNTER — Ambulatory Visit: Payer: Self-pay | Admitting: Internal Medicine

## 2009-01-07 ENCOUNTER — Ambulatory Visit: Payer: Self-pay | Admitting: Internal Medicine

## 2009-01-12 ENCOUNTER — Telehealth: Payer: Self-pay | Admitting: Internal Medicine

## 2009-01-12 ENCOUNTER — Ambulatory Visit: Payer: Self-pay | Admitting: Internal Medicine

## 2009-01-24 ENCOUNTER — Ambulatory Visit: Payer: Self-pay | Admitting: Internal Medicine

## 2009-01-31 ENCOUNTER — Telehealth: Payer: Self-pay | Admitting: Internal Medicine

## 2009-01-31 ENCOUNTER — Ambulatory Visit: Payer: Self-pay | Admitting: Internal Medicine

## 2009-02-07 ENCOUNTER — Telehealth (INDEPENDENT_AMBULATORY_CARE_PROVIDER_SITE_OTHER): Payer: Self-pay | Admitting: *Deleted

## 2009-02-08 ENCOUNTER — Ambulatory Visit: Payer: Self-pay | Admitting: Internal Medicine

## 2009-02-09 ENCOUNTER — Telehealth: Payer: Self-pay | Admitting: Internal Medicine

## 2009-02-14 ENCOUNTER — Telehealth (INDEPENDENT_AMBULATORY_CARE_PROVIDER_SITE_OTHER): Payer: Self-pay | Admitting: *Deleted

## 2009-02-16 ENCOUNTER — Ambulatory Visit: Payer: Self-pay | Admitting: Internal Medicine

## 2009-02-16 LAB — CONVERTED CEMR LAB
BUN: 13 mg/dL (ref 6–23)
Basophils Relative: 0.5 % (ref 0.0–3.0)
Chloride: 100 meq/L (ref 96–112)
Eosinophils Relative: 0.4 % (ref 0.0–5.0)
Glucose, Bld: 109 mg/dL — ABNORMAL HIGH (ref 70–99)
Hemoglobin: 11.7 g/dL — ABNORMAL LOW (ref 12.0–15.0)
Lymphocytes Relative: 4.2 % — ABNORMAL LOW (ref 12.0–46.0)
MCV: 81 fL (ref 78.0–100.0)
Monocytes Absolute: 1.1 10*3/uL — ABNORMAL HIGH (ref 0.1–1.0)
Neutrophils Relative %: 84.5 % — ABNORMAL HIGH (ref 43.0–77.0)
Potassium: 4.5 meq/L (ref 3.5–5.1)
RBC: 4.33 M/uL (ref 3.87–5.11)
WBC: 10.7 10*3/uL — ABNORMAL HIGH (ref 4.5–10.5)

## 2009-02-17 ENCOUNTER — Encounter: Payer: Self-pay | Admitting: Internal Medicine

## 2009-02-17 ENCOUNTER — Ambulatory Visit: Payer: Self-pay

## 2009-02-21 ENCOUNTER — Ambulatory Visit: Payer: Self-pay | Admitting: Internal Medicine

## 2009-02-24 ENCOUNTER — Encounter: Payer: Self-pay | Admitting: Internal Medicine

## 2009-03-02 ENCOUNTER — Ambulatory Visit: Payer: Self-pay | Admitting: Internal Medicine

## 2009-03-09 ENCOUNTER — Ambulatory Visit: Payer: Self-pay | Admitting: Internal Medicine

## 2009-03-09 ENCOUNTER — Inpatient Hospital Stay (HOSPITAL_COMMUNITY): Admission: EM | Admit: 2009-03-09 | Discharge: 2009-03-19 | Payer: Self-pay | Admitting: Emergency Medicine

## 2009-03-11 ENCOUNTER — Encounter: Payer: Self-pay | Admitting: Internal Medicine

## 2009-03-25 ENCOUNTER — Encounter: Payer: Self-pay | Admitting: Internal Medicine

## 2009-03-29 ENCOUNTER — Encounter: Payer: Self-pay | Admitting: Internal Medicine

## 2009-03-29 ENCOUNTER — Ambulatory Visit: Payer: Self-pay | Admitting: Internal Medicine

## 2009-03-29 DIAGNOSIS — N3000 Acute cystitis without hematuria: Secondary | ICD-10-CM

## 2009-03-29 LAB — CONVERTED CEMR LAB
BUN: 13 mg/dL (ref 6–23)
CO2: 33 meq/L — ABNORMAL HIGH (ref 19–32)
Chloride: 102 meq/L (ref 96–112)
Creatinine, Ser: 0.6 mg/dL (ref 0.4–1.2)
Eosinophils Absolute: 0.2 10*3/uL (ref 0.0–0.7)
MCHC: 32.7 g/dL (ref 30.0–36.0)
MCV: 80.4 fL (ref 78.0–100.0)
Monocytes Absolute: 0.7 10*3/uL (ref 0.1–1.0)
Neutrophils Relative %: 79.2 % — ABNORMAL HIGH (ref 43.0–77.0)
Nitrite: NEGATIVE
Platelets: 197 10*3/uL (ref 150.0–400.0)
Urine Glucose: NEGATIVE mg/dL
Urobilinogen, UA: 0.2 (ref 0.0–1.0)

## 2009-04-08 ENCOUNTER — Ambulatory Visit: Payer: Self-pay | Admitting: Internal Medicine

## 2009-04-11 ENCOUNTER — Encounter: Payer: Self-pay | Admitting: Internal Medicine

## 2009-04-12 ENCOUNTER — Ambulatory Visit: Payer: Self-pay | Admitting: Internal Medicine

## 2009-04-13 ENCOUNTER — Telehealth (INDEPENDENT_AMBULATORY_CARE_PROVIDER_SITE_OTHER): Payer: Self-pay | Admitting: *Deleted

## 2009-04-22 ENCOUNTER — Ambulatory Visit: Payer: Self-pay | Admitting: Internal Medicine

## 2009-04-27 ENCOUNTER — Ambulatory Visit: Payer: Self-pay | Admitting: Internal Medicine

## 2009-05-05 ENCOUNTER — Ambulatory Visit: Payer: Self-pay | Admitting: Internal Medicine

## 2009-05-10 ENCOUNTER — Ambulatory Visit: Payer: Self-pay | Admitting: Internal Medicine

## 2009-05-10 ENCOUNTER — Telehealth: Payer: Self-pay | Admitting: Internal Medicine

## 2009-05-18 ENCOUNTER — Telehealth: Payer: Self-pay | Admitting: Internal Medicine

## 2009-05-18 ENCOUNTER — Ambulatory Visit: Payer: Self-pay | Admitting: Internal Medicine

## 2009-05-20 ENCOUNTER — Encounter: Payer: Self-pay | Admitting: Internal Medicine

## 2009-05-26 ENCOUNTER — Ambulatory Visit: Payer: Self-pay | Admitting: Internal Medicine

## 2009-05-30 ENCOUNTER — Ambulatory Visit: Payer: Self-pay | Admitting: Internal Medicine

## 2009-06-01 ENCOUNTER — Encounter: Payer: Self-pay | Admitting: Internal Medicine

## 2009-06-09 ENCOUNTER — Ambulatory Visit: Payer: Self-pay | Admitting: Internal Medicine

## 2009-06-14 ENCOUNTER — Ambulatory Visit: Payer: Self-pay | Admitting: Internal Medicine

## 2009-06-22 ENCOUNTER — Ambulatory Visit: Payer: Self-pay | Admitting: Internal Medicine

## 2009-06-29 ENCOUNTER — Ambulatory Visit: Payer: Self-pay | Admitting: Internal Medicine

## 2009-07-06 ENCOUNTER — Ambulatory Visit: Payer: Self-pay | Admitting: Internal Medicine

## 2009-07-14 ENCOUNTER — Ambulatory Visit: Payer: Self-pay | Admitting: Internal Medicine

## 2009-07-20 ENCOUNTER — Ambulatory Visit: Payer: Self-pay | Admitting: Internal Medicine

## 2009-07-27 ENCOUNTER — Ambulatory Visit: Payer: Self-pay | Admitting: Internal Medicine

## 2009-07-28 ENCOUNTER — Ambulatory Visit: Payer: Self-pay | Admitting: Internal Medicine

## 2009-08-03 ENCOUNTER — Ambulatory Visit: Payer: Self-pay | Admitting: Internal Medicine

## 2009-08-10 ENCOUNTER — Ambulatory Visit: Payer: Self-pay | Admitting: Internal Medicine

## 2009-08-17 ENCOUNTER — Ambulatory Visit: Payer: Self-pay | Admitting: Internal Medicine

## 2009-08-31 ENCOUNTER — Ambulatory Visit: Payer: Self-pay | Admitting: Internal Medicine

## 2009-09-07 ENCOUNTER — Ambulatory Visit: Payer: Self-pay | Admitting: Internal Medicine

## 2009-09-14 ENCOUNTER — Ambulatory Visit: Payer: Self-pay | Admitting: Internal Medicine

## 2009-09-21 ENCOUNTER — Ambulatory Visit: Payer: Self-pay | Admitting: Internal Medicine

## 2009-09-26 ENCOUNTER — Ambulatory Visit: Payer: Self-pay | Admitting: Internal Medicine

## 2009-10-05 ENCOUNTER — Ambulatory Visit: Payer: Self-pay | Admitting: Internal Medicine

## 2009-10-06 ENCOUNTER — Encounter: Payer: Self-pay | Admitting: Internal Medicine

## 2009-10-19 ENCOUNTER — Ambulatory Visit: Payer: Self-pay | Admitting: Internal Medicine

## 2009-10-26 ENCOUNTER — Ambulatory Visit: Payer: Self-pay | Admitting: Internal Medicine

## 2009-11-02 ENCOUNTER — Ambulatory Visit: Payer: Self-pay | Admitting: Internal Medicine

## 2009-11-09 ENCOUNTER — Telehealth: Payer: Self-pay | Admitting: Internal Medicine

## 2009-11-10 ENCOUNTER — Ambulatory Visit: Payer: Self-pay | Admitting: Internal Medicine

## 2009-11-18 ENCOUNTER — Ambulatory Visit: Payer: Self-pay | Admitting: Internal Medicine

## 2009-12-01 ENCOUNTER — Ambulatory Visit: Payer: Self-pay | Admitting: Internal Medicine

## 2009-12-08 ENCOUNTER — Ambulatory Visit: Payer: Self-pay | Admitting: Internal Medicine

## 2009-12-15 ENCOUNTER — Ambulatory Visit: Payer: Self-pay | Admitting: Internal Medicine

## 2009-12-21 ENCOUNTER — Ambulatory Visit: Payer: Self-pay | Admitting: Internal Medicine

## 2009-12-28 ENCOUNTER — Ambulatory Visit: Payer: Self-pay | Admitting: Internal Medicine

## 2010-01-04 ENCOUNTER — Ambulatory Visit: Payer: Self-pay | Admitting: Internal Medicine

## 2010-01-11 ENCOUNTER — Ambulatory Visit: Payer: Self-pay | Admitting: Internal Medicine

## 2010-01-12 ENCOUNTER — Ambulatory Visit: Payer: Self-pay | Admitting: Internal Medicine

## 2010-01-18 ENCOUNTER — Ambulatory Visit: Payer: Self-pay | Admitting: Internal Medicine

## 2010-01-24 ENCOUNTER — Ambulatory Visit: Payer: Self-pay | Admitting: Internal Medicine

## 2010-01-26 ENCOUNTER — Telehealth: Payer: Self-pay | Admitting: Internal Medicine

## 2010-01-30 ENCOUNTER — Telehealth (INDEPENDENT_AMBULATORY_CARE_PROVIDER_SITE_OTHER): Payer: Self-pay | Admitting: *Deleted

## 2010-02-02 ENCOUNTER — Ambulatory Visit: Payer: Self-pay | Admitting: Internal Medicine

## 2010-02-09 ENCOUNTER — Ambulatory Visit: Payer: Self-pay | Admitting: Internal Medicine

## 2010-02-13 LAB — CONVERTED CEMR LAB
BUN: 12 mg/dL (ref 6–23)
Basophils Absolute: 0 10*3/uL (ref 0.0–0.1)
Chloride: 100 meq/L (ref 96–112)
Creatinine, Ser: 0.7 mg/dL (ref 0.4–1.2)
Eosinophils Absolute: 0.2 10*3/uL (ref 0.0–0.7)
Eosinophils Relative: 1.6 % (ref 0.0–5.0)
Glucose, Bld: 88 mg/dL (ref 70–99)
MCV: 85.9 fL (ref 78.0–100.0)
Monocytes Absolute: 1.2 10*3/uL — ABNORMAL HIGH (ref 0.1–1.0)
Neutrophils Relative %: 78.9 % — ABNORMAL HIGH (ref 43.0–77.0)
Platelets: 185 10*3/uL (ref 150.0–400.0)
Pro B Natriuretic peptide (BNP): 212 pg/mL — ABNORMAL HIGH (ref 0.0–100.0)
RDW: 14.5 % (ref 11.5–14.6)
WBC: 10.2 10*3/uL (ref 4.5–10.5)

## 2010-02-15 ENCOUNTER — Ambulatory Visit: Payer: Self-pay | Admitting: Internal Medicine

## 2010-02-22 ENCOUNTER — Ambulatory Visit: Payer: Self-pay | Admitting: Internal Medicine

## 2010-03-02 ENCOUNTER — Ambulatory Visit: Payer: Self-pay | Admitting: Internal Medicine

## 2010-03-02 ENCOUNTER — Encounter: Payer: Self-pay | Admitting: Internal Medicine

## 2010-03-15 ENCOUNTER — Ambulatory Visit: Payer: Self-pay | Admitting: Internal Medicine

## 2010-03-22 ENCOUNTER — Ambulatory Visit: Payer: Self-pay | Admitting: Internal Medicine

## 2010-03-29 ENCOUNTER — Ambulatory Visit: Payer: Self-pay | Admitting: Internal Medicine

## 2010-04-05 ENCOUNTER — Ambulatory Visit: Payer: Self-pay | Admitting: Internal Medicine

## 2010-04-12 ENCOUNTER — Ambulatory Visit: Payer: Self-pay | Admitting: Internal Medicine

## 2010-04-19 ENCOUNTER — Ambulatory Visit: Payer: Self-pay | Admitting: Internal Medicine

## 2010-04-20 ENCOUNTER — Ambulatory Visit: Payer: Self-pay | Admitting: Internal Medicine

## 2010-04-20 DIAGNOSIS — R42 Dizziness and giddiness: Secondary | ICD-10-CM | POA: Insufficient documentation

## 2010-04-20 DIAGNOSIS — R042 Hemoptysis: Secondary | ICD-10-CM | POA: Insufficient documentation

## 2010-04-26 ENCOUNTER — Telehealth (INDEPENDENT_AMBULATORY_CARE_PROVIDER_SITE_OTHER): Payer: Self-pay | Admitting: *Deleted

## 2010-04-26 ENCOUNTER — Ambulatory Visit: Payer: Self-pay | Admitting: Internal Medicine

## 2010-04-26 LAB — CONVERTED CEMR LAB
Basophils Absolute: 0 10*3/uL (ref 0.0–0.1)
Eosinophils Relative: 2.4 % (ref 0.0–5.0)
Lymphocytes Relative: 12.2 % (ref 12.0–46.0)
Monocytes Relative: 9.8 % (ref 3.0–12.0)
Neutrophils Relative %: 75.2 % (ref 43.0–77.0)
Platelets: 177 10*3/uL (ref 150.0–400.0)
Prothrombin Time: 22.5 s — ABNORMAL HIGH (ref 9.7–11.8)
RDW: 14.5 % (ref 11.5–14.6)
WBC: 9.6 10*3/uL (ref 4.5–10.5)
aPTT: 37.5 s — ABNORMAL HIGH (ref 21.7–28.8)

## 2010-05-03 ENCOUNTER — Ambulatory Visit: Payer: Self-pay | Admitting: Internal Medicine

## 2010-05-09 ENCOUNTER — Ambulatory Visit: Payer: Self-pay | Admitting: Internal Medicine

## 2010-05-17 ENCOUNTER — Ambulatory Visit: Payer: Self-pay | Admitting: Internal Medicine

## 2010-05-18 ENCOUNTER — Ambulatory Visit: Payer: Self-pay | Admitting: Internal Medicine

## 2010-05-26 ENCOUNTER — Ambulatory Visit: Payer: Self-pay | Admitting: Internal Medicine

## 2010-06-02 ENCOUNTER — Telehealth: Payer: Self-pay | Admitting: Internal Medicine

## 2010-06-07 ENCOUNTER — Ambulatory Visit: Payer: Self-pay | Admitting: Cardiology

## 2010-06-07 ENCOUNTER — Ambulatory Visit: Payer: Self-pay | Admitting: Internal Medicine

## 2010-06-16 ENCOUNTER — Ambulatory Visit: Payer: Self-pay | Admitting: Internal Medicine

## 2010-06-23 ENCOUNTER — Ambulatory Visit: Payer: Self-pay | Admitting: Internal Medicine

## 2010-06-28 ENCOUNTER — Ambulatory Visit: Payer: Self-pay | Admitting: Internal Medicine

## 2010-06-28 ENCOUNTER — Ambulatory Visit: Payer: Self-pay | Admitting: Cardiology

## 2010-07-05 ENCOUNTER — Ambulatory Visit: Payer: Self-pay | Admitting: Internal Medicine

## 2010-07-07 ENCOUNTER — Telehealth (INDEPENDENT_AMBULATORY_CARE_PROVIDER_SITE_OTHER): Payer: Self-pay | Admitting: *Deleted

## 2010-07-12 ENCOUNTER — Ambulatory Visit: Payer: Self-pay | Admitting: Cardiology

## 2010-07-12 ENCOUNTER — Ambulatory Visit: Payer: Self-pay | Admitting: Internal Medicine

## 2010-07-17 ENCOUNTER — Ambulatory Visit: Payer: Self-pay | Admitting: Internal Medicine

## 2010-07-17 DIAGNOSIS — R141 Gas pain: Secondary | ICD-10-CM

## 2010-07-17 DIAGNOSIS — R143 Flatulence: Secondary | ICD-10-CM

## 2010-07-17 DIAGNOSIS — K5900 Constipation, unspecified: Secondary | ICD-10-CM | POA: Insufficient documentation

## 2010-07-17 DIAGNOSIS — R142 Eructation: Secondary | ICD-10-CM

## 2010-07-20 ENCOUNTER — Ambulatory Visit: Payer: Self-pay | Admitting: Internal Medicine

## 2010-07-26 ENCOUNTER — Ambulatory Visit: Payer: Self-pay | Admitting: Internal Medicine

## 2010-08-04 ENCOUNTER — Ambulatory Visit: Payer: Self-pay | Admitting: Internal Medicine

## 2010-08-09 ENCOUNTER — Ambulatory Visit: Payer: Self-pay | Admitting: Cardiology

## 2010-08-09 ENCOUNTER — Ambulatory Visit: Payer: Self-pay | Admitting: Internal Medicine

## 2010-08-18 ENCOUNTER — Ambulatory Visit: Payer: Self-pay | Admitting: Internal Medicine

## 2010-08-25 ENCOUNTER — Ambulatory Visit: Payer: Self-pay | Admitting: Internal Medicine

## 2010-08-31 ENCOUNTER — Ambulatory Visit: Payer: Self-pay | Admitting: Internal Medicine

## 2010-09-06 ENCOUNTER — Ambulatory Visit: Payer: Self-pay | Admitting: Internal Medicine

## 2010-09-12 ENCOUNTER — Ambulatory Visit: Payer: Self-pay | Admitting: Cardiovascular Disease

## 2010-09-13 ENCOUNTER — Ambulatory Visit: Payer: Self-pay | Admitting: Internal Medicine

## 2010-09-21 ENCOUNTER — Ambulatory Visit: Payer: Self-pay | Admitting: Internal Medicine

## 2010-10-03 ENCOUNTER — Telehealth: Payer: Self-pay | Admitting: Internal Medicine

## 2010-10-06 ENCOUNTER — Ambulatory Visit: Payer: Self-pay | Admitting: Cardiology

## 2010-10-06 ENCOUNTER — Ambulatory Visit: Payer: Self-pay | Admitting: Internal Medicine

## 2010-10-07 ENCOUNTER — Encounter: Payer: Self-pay | Admitting: Internal Medicine

## 2010-10-09 ENCOUNTER — Ambulatory Visit: Payer: Self-pay | Admitting: Internal Medicine

## 2010-10-12 ENCOUNTER — Ambulatory Visit: Payer: Self-pay | Admitting: Internal Medicine

## 2010-10-17 ENCOUNTER — Encounter: Payer: Self-pay | Admitting: Internal Medicine

## 2010-10-20 ENCOUNTER — Encounter: Payer: Self-pay | Admitting: Internal Medicine

## 2010-10-23 ENCOUNTER — Ambulatory Visit: Payer: Self-pay | Admitting: Internal Medicine

## 2010-10-24 ENCOUNTER — Encounter: Payer: Self-pay | Admitting: Internal Medicine

## 2010-11-02 ENCOUNTER — Ambulatory Visit: Payer: Self-pay | Admitting: Cardiology

## 2010-11-03 ENCOUNTER — Ambulatory Visit: Payer: Self-pay | Admitting: Internal Medicine

## 2010-11-20 ENCOUNTER — Ambulatory Visit
Admission: RE | Admit: 2010-11-20 | Discharge: 2010-11-20 | Payer: Self-pay | Source: Home / Self Care | Attending: Internal Medicine | Admitting: Internal Medicine

## 2010-11-20 ENCOUNTER — Ambulatory Visit: Payer: Self-pay | Admitting: Internal Medicine

## 2010-11-23 ENCOUNTER — Ambulatory Visit: Payer: Self-pay | Admitting: Internal Medicine

## 2010-11-25 ENCOUNTER — Encounter: Payer: Self-pay | Admitting: Pulmonary Disease

## 2010-11-26 ENCOUNTER — Encounter: Payer: Self-pay | Admitting: Pulmonary Disease

## 2010-11-27 ENCOUNTER — Ambulatory Visit: Payer: Self-pay | Admitting: Cardiology

## 2010-11-28 ENCOUNTER — Encounter: Payer: Self-pay | Admitting: Internal Medicine

## 2010-11-30 ENCOUNTER — Ambulatory Visit: Payer: Self-pay | Admitting: Internal Medicine

## 2010-12-05 ENCOUNTER — Ambulatory Visit
Admission: RE | Admit: 2010-12-05 | Discharge: 2010-12-05 | Payer: Self-pay | Source: Home / Self Care | Attending: Internal Medicine | Admitting: Internal Medicine

## 2010-12-05 ENCOUNTER — Other Ambulatory Visit: Payer: Self-pay | Admitting: Internal Medicine

## 2010-12-05 DIAGNOSIS — R1013 Epigastric pain: Secondary | ICD-10-CM | POA: Insufficient documentation

## 2010-12-05 DIAGNOSIS — R209 Unspecified disturbances of skin sensation: Secondary | ICD-10-CM | POA: Insufficient documentation

## 2010-12-05 DIAGNOSIS — I951 Orthostatic hypotension: Secondary | ICD-10-CM | POA: Insufficient documentation

## 2010-12-05 DIAGNOSIS — L608 Other nail disorders: Secondary | ICD-10-CM | POA: Insufficient documentation

## 2010-12-06 ENCOUNTER — Ambulatory Visit: Payer: Self-pay | Admitting: Cardiology

## 2010-12-06 ENCOUNTER — Ambulatory Visit (INDEPENDENT_AMBULATORY_CARE_PROVIDER_SITE_OTHER): Payer: Medicare Other | Admitting: Cardiology

## 2010-12-06 ENCOUNTER — Other Ambulatory Visit (INDEPENDENT_AMBULATORY_CARE_PROVIDER_SITE_OTHER): Payer: Medicare Other

## 2010-12-06 DIAGNOSIS — I4891 Unspecified atrial fibrillation: Secondary | ICD-10-CM

## 2010-12-06 DIAGNOSIS — R0789 Other chest pain: Secondary | ICD-10-CM

## 2010-12-06 DIAGNOSIS — J301 Allergic rhinitis due to pollen: Secondary | ICD-10-CM

## 2010-12-06 DIAGNOSIS — R0989 Other specified symptoms and signs involving the circulatory and respiratory systems: Secondary | ICD-10-CM

## 2010-12-06 LAB — CBC WITH DIFFERENTIAL/PLATELET
Basophils Relative: 0.1 % (ref 0.0–3.0)
Eosinophils Absolute: 0.1 10*3/uL (ref 0.0–0.7)
Eosinophils Relative: 1.5 % (ref 0.0–5.0)
Lymphocytes Relative: 16.8 % (ref 12.0–46.0)
Monocytes Absolute: 0.6 10*3/uL (ref 0.1–1.0)
Neutrophils Relative %: 75.6 % (ref 43.0–77.0)
Platelets: 185 10*3/uL (ref 150.0–400.0)
RBC: 4.61 Mil/uL (ref 3.87–5.11)
WBC: 9.7 10*3/uL (ref 4.5–10.5)

## 2010-12-06 LAB — ALT: ALT: 59 U/L — ABNORMAL HIGH (ref 0–35)

## 2010-12-06 LAB — TSH: TSH: 5.22 u[IU]/mL (ref 0.35–5.50)

## 2010-12-06 LAB — T4, FREE: Free T4: 0.68 ng/dL (ref 0.60–1.60)

## 2010-12-06 LAB — AMYLASE: Amylase: 45 U/L (ref 27–131)

## 2010-12-06 LAB — LIPASE: Lipase: 34 U/L (ref 11.0–59.0)

## 2010-12-07 NOTE — Assessment & Plan Note (Signed)
Summary: spitting up blood/jd   Copy to:  n.a Primary Provider/Referring Provider:  [name], MD  CC:  c/o cough x wk.-hemoptysis approx. 1 tsp., sob increased, wheezing, and no fcs.  History of Present Illness: April 20, 2010- Asthma, rhinosinusitis, allergic rhinitis, hemoptysis, hx AVR. AF/ coumadin Finished Dulera trial. She liked it, but will return to her leftover Symbicort for now. Now taking amoxacillin for bronchits. She likes having it available.  Started coughing red blood small clots. Due for coumadin check at Dr Angelina Pih on 6/22. Her usual goal is 2.5 INR on 03/21/10.Started bleeding before the antibiotic.  C/O orthostatic dizziness x 3 days. Some vertigo. Yesterday went on and off most of the day. Swishing noise ? left ear w/out tinnitus. Much sneezing and nose  blowing. She would like to restart Singulair for this. Continues allergy vaccine. Uses a little liquid benadryl for sleep.   July 20, 2010-  Can't walk any without continuous flow oxygen at 3 L. If she sits, sats quickly go up to 98% on 2-3 L.. Gets a nose bleed about once a month.  Continues allergy vaccine at 1:10. Humidity bothered her. She continues Dulera 200 without a spacer and she think it helps better than Symbicort. Today her lungs feel clear. CXR- 04/10/10- CE s/pAVR/ pacemaker, scarring and COPD changes CBC- Hgb 1.6, platelets 177K.  September 21, 2010- Asthma, rhinosinusitis, allergic rhinitis, hemoptysis, hx AVR. AF/ coumadin Reports hemoptysis agan, on coumadin.  Nurse-CC: c/o cough x wk.-hemoptysis approx. 1 tsp.,sob increased,wheezing,no fcs CXR- LLL infilt new Husband in icu after fall, injuring hip. She is stressed and tearful about this. She is too weak and dyspneic to walk distance through [facility] to visit him.  Started hemoptysis Nov 10 x 5 days, now stopped. Denies fever or purulent, but stays yellow. She doesn't feel she needs antibiotic, but asks for prednisone for dyspnea.  Feels tight across upper abdomen w/ no pain. Denies anginal pain, palpitation, blood, change in bowel or bladder.       Asthma History    Asthma Control Assessment:    Age range: 12+ years    Symptoms: >2 days/week    Nighttime Awakenings: 0-2/month    Interferes w/ normal activity: some limitations    SABA use (not for EIB): >2 days/week    Asthma Control Assessment: Not Well Controlled   Preventive Screening-Counseling & Management  Alcohol-Tobacco     Smoking Status: quit     Year Quit: 1995  Current Medications (verified): 1)  Dulera 200-5 Mcg/act Aero (Mometasone Furo-Formoterol Fum) .... 2 Puffs Two Times A Day As Needed 2)  Spiriva Handihaler 18 Mcg  Caps (Tiotropium Bromide Monohydrate) .... Inhale Contents of 1 Capsule Once A Day 3)  Xopenex Hfa 45 Mcg/act  Aero (Levalbuterol Tartrate) .... 2 Puffs Four Times A Day As Needed 4)  Oxygen 2.5 - 3 Liters .... 24/7 5)  Xopenex 1.25 Mg/23ml  Nebu (Levalbuterol Hcl) .... Three Times A Day 6)  Allergy Vaccine 1:10 Gh .... Starting Build Up 7)  Childrens Allergy 12.5 Mg/58ml Liqd (Diphenhydramine Hcl) .... As Directed On Bottle 8)  Furosemide 40 Mg  Tabs (Furosemide) .... 1/2 Two Times A Day 9)  Lanoxin 0.125 Mg  Tabs (Digoxin) .... Take 1 Tablet By Mouth Once A Day 10)  Cardizem La 240 Mg  Tb24 (Diltiazem Hcl Coated Beads) .Marland Kitchen.. 1 By Mouth Once Daily **appointment Due For Additional Refills** 11)  Klor-Con 20 Meq  Pack (Potassium Chloride) .... Take 1 Tablet  By Mouth Two Times A Day 12)  Zetia 10 Mg  Tabs (Ezetimibe) .Marland Kitchen.. 1 By Mouth Qd 13)  Warfarin Sodium 1 Mg  Tabs (Warfarin Sodium) .... As Directed 14)  Zoloft 50 Mg  Tabs (Sertraline Hcl) .... Take 1 Tablet By Mouth Once A Day 15)  Mucinex Maximum Strength 1200 Mg Xr12h-Tab (Guaifenesin) .... Take 1 Tablet By Mouth Two Times A Day 16)  Vitamin C 500 Mg  Tabs (Ascorbic Acid) .... Take 1 Tablet By Mouth Once A Day 17)  Stool Softener .... Take 1 Tablet By Mouth Once A Day 18)   Cvs Saline Nasal Spray 0.65 %  Soln (Saline) .... As Needed 19)  Coq10 100 Mg  Caps (Coenzyme Q10) .... Take 1 Tablet By Mouth Once A Day 20)  Multivitamins   Tabs (Multiple Vitamin) .... Take 1 Tablet By Mouth Once A Day 21)  Benzonatate 100 Mg Caps (Benzonatate) .Marland Kitchen.. 1 or 2 Four Times A Day As Needed 22)  Nexium 40 Mg Cpdr (Esomeprazole Magnesium) .... Take 1 Tablet By Mouth Once A Day 23)  Calcium Citrate +  Tabs (Multiple Minerals-Vitamins) .Marland Kitchen.. 1 Once Daily 24)  Aerochamber Z-Stat Plus  Misc (Spacer/aero-Holding Chambers) .... Use With Inhaler As Directed 25)  Sotalol Hcl 80 Mg Tabs (Sotalol Hcl) .... 2 Once Daily 26)  Vitamin B-12 100 Mcg Tabs (Cyanocobalamin) .... Take 1 By Mouth Once Daily 27)  Singulair 10 Mg Tabs (Montelukast Sodium) .Marland Kitchen.. 1 Daily For Asthma 28)  Crestor 5 Mg Tabs (Rosuvastatin Calcium) .... Take 1 Tablet Two Times A Week  Allergies (verified): 1)  ! Sulfa 2)  Codeine 3)  * Mycins 4)  Cephalexin 5)  * Estrace 6)  * Analopram 7)  * Promethazine With Codeine 8)  * Chemdal 9)  * Hydrocortisone 10)  * Avelox  Social History: Smoking Status:  quit  Review of Systems      See HPI       The patient complains of shortness of breath with activity, productive cough, non-productive cough, anxiety, and depression.  The patient denies shortness of breath at rest, coughing up blood, chest pain, irregular heartbeats, acid heartburn, indigestion, loss of appetite, weight change, abdominal pain, difficulty swallowing, sore throat, tooth/dental problems, headaches, nasal congestion/difficulty breathing through nose, sneezing, itching, rash, and change in color of mucus.    Vital Signs:  Patient profile:   77 year old female Height:      62.5 inches Weight:      131.25 pounds O2 Sat:      95 % on 3 L/min Pulse rate:   60 / minute BP sitting:   116 / 62  (left arm) Cuff size:   regular  Vitals Entered By: Elray Buba RN (September 21, 2010 12:17 PM)  O2 Flow:  3  L/min CC: c/o cough x wk.-hemoptysis approx. 1 tsp.,sob increased,wheezing,no fcs Is Patient Diabetic? No Comments Medications reviewed with patient ,Elray Buba RN  September 21, 2010 12:17 PM    Physical Exam  Additional Exam:  General: A/Ox3; pleasant and cooperative, NAD, supplemental oxygen- saturation 95% at rest on 3L demand regulator SKIN: ndry scaling thickened skin on elbows. NODES: no lymphadenopathy HEENT: [place]/AT, EOM- WNL, Conjuctivae- clear, PERRLA, TM-WNL, Nose- mucosal erosion, Throat- clear and wnl, Mallampatii II, oral mucosa ok NECK: Supple w/ fair ROM, JVD- none, normal carotid impulses w/o bruits Thyroid- CHEST: diminished ,, no dullness, unlabored, few rhonchi in left base.  HEART: RRR, 2/6 systolic precordial murmur ABDOMEN: soft YQI:HKVQ,  nl pulses, no edema  NEURO: Grossly intact to observation, no nystagmus      Impression & Recommendations:  Problem # 1:  HEMOPTYSIS (ICD-786.3) Recent hemorrhagic bronchitis again,  on coumadin, spontaneously resolved. . We will ease her work of breathing with low dose prednisone, partly to address her stress about husband, but will not give antibiotic now. She is weak and complicated by her several comorbidities.  Her updated medication list for this problem includes:    Dulera 200-5 Mcg/act Aero (Mometasone furo-formoterol fum) .Marland Kitchen... 2 puffs two times a day as needed    Spiriva Handihaler 18 Mcg Caps (Tiotropium bromide monohydrate) ..... Inhale contents of 1 capsule once a day    Xopenex Hfa 45 Mcg/act Aero (Levalbuterol tartrate) .Marland Kitchen... 2 puffs four times a day as needed    Xopenex 1.25 Mg/35ml Nebu (Levalbuterol hcl) .Marland Kitchen... Three times a day    Vitamin C 500 Mg Tabs (Ascorbic acid) .Marland Kitchen... Take 1 tablet by mouth once a day    Multivitamins Tabs (Multiple vitamin) .Marland Kitchen... Take 1 tablet by mouth once a day    Singulair 10 Mg Tabs (Montelukast sodium) .Marland Kitchen... 1 daily for asthma    Prednisone 10 Mg Tabs (Prednisone) .Marland Kitchen... 1 daily  x 7 days  Medications Added to Medication List This Visit: 1)  Crestor 5 Mg Tabs (Rosuvastatin calcium) .... Take 1 tablet two times a week 2)  Prednisone 10 Mg Tabs (Prednisone) .Marland Kitchen.. 1 daily x 7 days  Other Orders: T-2 View CXR (71020TC) Est. Patient Level III (21308) DME Referral (DME)  Patient Instructions: 1)  Please schedule a follow-up appointment in 3 months. 2)  Adult wheel chair- script tpo PCC 3)  Prednisone script to drug store Prescriptions: PREDNISONE 10 MG TABS (PREDNISONE) 1 daily x 7 days  #7 x 0   Entered and Authorized by:   Waymon Budge MD   Signed by:   Waymon Budge MD on 09/21/2010   Method used:   Electronically to        [id number] (retail)       [address]       [place], [place]  [address]       Ph: [phone] or [phone]       Fax: [phone]   RxID:   [id number]

## 2010-12-07 NOTE — Miscellaneous (Signed)
Summary: Device preload  Clinical Lists Changes  Observations: Added new observation of PPM INDICATN: A-fib (10/07/2010 11:33) Added new observation of MAGNET RTE: BOL 85 ERI  65 (10/07/2010 11:33) Added new observation of PPMLEADSTAT2: active (10/07/2010 11:33) Added new observation of PPMLEADSER2: 161096  (10/07/2010 11:33) Added new observation of PPMLEADMOD2: 4470  (10/07/2010 11:33) Added new observation of PPMLEADDOI2: 03/16/2009  (10/07/2010 11:33) Added new observation of PPMLEADLOC2: RV  (10/07/2010 11:33) Added new observation of PPMLEADSTAT1: active  (10/07/2010 11:33) Added new observation of PPMLEADSER1: 045409  (10/07/2010 11:33) Added new observation of PPMLEADMOD1: 4469  (10/07/2010 11:33) Added new observation of PPMLEADDOI1: 03/16/2009  (10/07/2010 11:33) Added new observation of PPMLEADLOC1: RA  (10/07/2010 11:33) Added new observation of PPM DOI: 03/16/2009  (10/07/2010 11:33) Added new observation of PPM SERL#: [id number]  (10/07/2010 11:33) Added new observation of PPM MODL#: [id number]  (10/07/2010 11:33) Added new observation of PACEMAKERMFG: [employer]onic  (10/07/2010 11:33) Added new observation of PPM IMP MD: Duffy Rhody Tennant,MD  (10/07/2010 11:33) Added new observation of PPM REFER MD: Roger Shelter, MD  (10/07/2010 11:33) Added new observation of PACEMAKER MD: Lewayne Bunting, MD  (10/07/2010 11:33)      PPM Specifications Following MD:  Lewayne Bunting, MD     Referring MD:  Roger Shelter, MD PPM Vendor:  [employer]onic     PPM Model Number:  [id number]     PPM Serial Number:  [id number] PPM DOI:  03/16/2009     PPM Implanting MD:  Rolla Plate  Lead 1    Location: RA     DOI: 03/16/2009     Model #: [id number]     Serial #: [id number]     Status: active Lead 2    Location: RV     DOI: 03/16/2009     Model #: [id number]     Serial #: [id number]     Status: active  Magnet Response Rate:  BOL 85 ERI  65  Indications:  A-fib

## 2010-12-07 NOTE — Procedures (Signed)
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

## 2010-12-07 NOTE — Progress Notes (Signed)
Summary: [facility]   Imported By: Sherian Rein 07/18/2010 07:21:56  _____________________________________________________________________  External Attachment:    Type:   Image     Comment:   External Document

## 2010-12-07 NOTE — Miscellaneous (Signed)
Summary: Injection Record/[facility]  Injection Record/[facility]   Imported By: Sherian Rein 03/28/2010 08:55:07  _____________________________________________________________________  External Attachment:    Type:   Image     Comment:   External Document

## 2010-12-07 NOTE — Progress Notes (Signed)
Summary: refill  Phone Note Call from Patient Call back at [facility] Phone [phone]   Caller: Patient Call For: nadel Summary of Call: Refill for xopenex 1.25mg /30ml -[facility] mail order. Initial call taken by: Darletta Moll,  January 30, 2010 3:13 PM  Follow-up for Phone Call        90 day rx sent to [facility]  Follow-up by: Philipp Deputy CMA,  January 30, 2010 3:21 PM    Prescriptions: XOPENEX 1.25 MG/3ML  NEBU (LEVALBUTEROL HCL) three times a day  #270 x 3   Entered by:   Philipp Deputy CMA   Authorized by:   Michele Mcalpine MD   Signed by:   Philipp Deputy CMA on 01/30/2010   Method used:   Electronically to        [facility]* (mail-order)             ,          Ph: [phone]       Fax: [phone]   RxID:   [id number]

## 2010-12-07 NOTE — Cardiovascular Report (Signed)
Summary: Office Visit Remote   Office Visit Remote   Imported By: Roderic Ovens 10/23/2010 16:01:26  _____________________________________________________________________  External Attachment:    Type:   Image     Comment:   External Document

## 2010-12-07 NOTE — Assessment & Plan Note (Signed)
Summary: sob/ mbw   Primary Provider/Referring Provider:  hopper  CC:  SOB-turned up to 3l/m and still not helping.Marland Kitchen  History of Present Illness: September 26, 2009- COPD, Chronic respiratory failure, OSA, AF/pacemaker, CHF/ diastolic/AS, rhinitis She never feels able to get from one room to another unless she takes prednisone 10 mg daily. Gets wheezey and short of breath with weather changes. Has not needed antibiotic.  November 10, 2009- COPD, Chronic respiratory failure, OSA, AF/ pacemaker, CHF Sore throat x 2 days, sinus congestion and drainage, chest tight, wheeze, more short of breath, cough productive green. Husband is also sick. She started amoxacillin 2 days ago. Got flu  vax.  January 24, 2010- COPD, Chronic respiratory failure, OSA, AF/ pacemaker, CHF She got much better after last here. The depo shot may have helped. Starting about 2 weeks after that, she has noted gradual return of dyspnea with any exertion- arms or legs. OK sitting still. Saw Dr Patty Sermons who thought it might be fluid. He increased furosemide for 2 days and now back to 1/2 x 40 mg two times a day. Also her heart had jumped out of rhythm and has gone back. Coughing scant yellow. She has 5 mg prednisone at home and started 7 days ago taking 3 daily. Asked about tapering. Asks about travel to [place]/ altitude.  February 09, 2010- COPD, Chronic respiratory failure, OSA, AF/pacemaker, CHF Since last here has had continued gradual worsening dyspnea with minimal activity despite O2 at 3 L/m. She denies fever, sore throat, chest pain, palpitation or fluid retention. She wasn't sure what to do about prednisone, so she hadn't been taking it for past week. She is aware of pollen related watery rhinorhea. supine wheeze. Has coughed some thick white/ yellow.     Current Medications (verified): 1)  Symbicort 160-4.5 Mcg/act  Aero (Budesonide-Formoterol Fumarate) .... Inhale 2 Puffs Two Times A Day 2)  Spiriva Handihaler 18  Mcg  Caps (Tiotropium Bromide Monohydrate) .... Inhale Contents of 1 Capsule Once A Day 3)  Xopenex Hfa 45 Mcg/act  Aero (Levalbuterol Tartrate) .... 2 Puffs Four Times A Day As Needed 4)  Oxygen 2.5 - 3 Liters .... 24/7 5)  Singulair 10 Mg  Tabs (Montelukast Sodium) .Marland Kitchen.. 1 By Mouth Once Daily, Additional Refills Require An Appointment 6)  Xopenex 1.25 Mg/82ml  Nebu (Levalbuterol Hcl) .... Three Times A Day 7)  Allergy Vaccine 1:10 Gh .... Starting Build Up 8)  Childrens Allergy 12.5 Mg/42ml Liqd (Diphenhydramine Hcl) .... As Directed On Bottle 9)  Furosemide 40 Mg  Tabs (Furosemide) .... 1/2 Two Times A Day 10)  Lanoxin 0.125 Mg  Tabs (Digoxin) .... Take 1 Tablet By Mouth Once A Day 11)  Cardizem La 240 Mg  Tb24 (Diltiazem Hcl Coated Beads) .Marland Kitchen.. 1 By Mouth Qd 12)  Klor-Con 20 Meq  Pack (Potassium Chloride) .... Take 1 Tablet By Mouth Two Times A Day 13)  Zetia 10 Mg  Tabs (Ezetimibe) .Marland Kitchen.. 1 By Mouth Qd 14)  Warfarin Sodium 1 Mg  Tabs (Warfarin Sodium) .... As Directed 15)  Zoloft 50 Mg  Tabs (Sertraline Hcl) .... Take 1 Tablet By Mouth Once A Day 16)  Mucinex Maximum Strength 1200 Mg Xr12h-Tab (Guaifenesin) .... Take 1 Tablet By Mouth Two Times A Day 17)  Vitamin C 500 Mg  Tabs (Ascorbic Acid) .... Take 1 Tablet By Mouth Once A Day 18)  Stool Softener .... Take 1 Tablet By Mouth Once A Day 19)  Cvs Saline Nasal Spray  0.65 %  Soln (Saline) .... As Needed 20)  Coq10 100 Mg  Caps (Coenzyme Q10) .... Take 1 Tablet By Mouth Once A Day 21)  Multivitamins   Tabs (Multiple Vitamin) .... Take 1 Tablet By Mouth Once A Day 22)  Benzonatate 100 Mg Caps (Benzonatate) .Marland Kitchen.. 1 or 2 Four Times A Day As Needed 23)  Prilosec 20 Mg Cpdr (Omeprazole) .... Take 2 Tablet By Mouth Two Times A Day 24)  Calcium Citrate +  Tabs (Multiple Minerals-Vitamins) .Marland Kitchen.. 1 Once Daily 25)  Aerochamber Z-Stat Plus  Misc (Spacer/aero-Holding Chambers) .... Use With Inhaler As Directed 26)  Prednisone 5 Mg Tabs (Prednisone) .Marland Kitchen.. 1-4  Daily When Needed For Breathing Up To 14 Days. 27)  Sotalol Hcl 80 Mg Tabs (Sotalol Hcl) .... 2 Once Daily 28)  Vitamin B-12 100 Mcg Tabs (Cyanocobalamin) .... Take 1 By Mouth Once Daily  Allergies (verified): 1)  ! Sulfa 2)  Codeine 3)  * Mycins 4)  Cephalexin 5)  * Estrace 6)  * Analopram 7)  * Promethazine With Codeine 8)  * Chemdal 9)  * Hydrocortisone 10)  * Avelox  Past History:  Past Medical History: Last updated: 03/09/2009 ASTHMATIC BRONCHITIS, ACUTE (ICD-466.0) ALLERGIC RHINITIS (ICD-477.9) Hx of ATRIAL FIBRILLATION (ICD-427.31) S/P AORTIC VALVE REPLACEMENT ([id number])...................................................Marland KitchenTennant Hx of SLEEP APNEA, MILD (ICD-780.57) CHRONIC OBSTRUCTIVE ASTHMA UNSPECIFIED (ICD-493.20) OSTEOPOROSIS (ICD-733.00) FATIGUE, CHRONIC (ICD-780.79) PRURITIC DISORDER NOS (ICD-698.9)  Past Surgical History: Last updated: 05/30/2009 back surgery 1994 Tonsillectomy Appendectomy ganglionectomy vocal cord polyps 1975 and 1984 pneumonia 1980 atrial fibrillation pneumonia 10/2003 aortic valve replacement 02/2005 Pace maker June 2010  Family History: Last updated: 02/17/2008 father renal failure paternal aunt colon cancer mother value disease  CABG MATERNAL GRANDMOTHER CVA MATERNAL GRANDFATHER HEART DISEASE PATERNAL GRANDFATHER mi AUNT HTN  Social History: Last updated: 11/03/2007 Patient states former smoker- quit 1995 married  Risk Factors: Exercise: yes (10/23/2007)  Risk Factors: Smoking Status: quit (11/03/2007)  Review of Systems      See HPI       The patient complains of dyspnea on exertion and prolonged cough.  The patient denies anorexia, fever, weight loss, weight gain, vision loss, decreased hearing, hoarseness, chest pain, syncope, peripheral edema, hemoptysis, abdominal pain, and severe indigestion/heartburn.    Vital Signs:  Patient profile:   77 year old female Height:      62.5 inches Weight:      140.25  pounds O2 Sat:      83 % on 3 L/min Pulse rate:   68 / minute BP sitting:   118 / 74  (left arm) Cuff size:   regular  Vitals Entered By: Reynaldo Minium CMA (February 09, 2010 9:25 AM)  O2 Flow:  3 L/min  Physical Exam  Additional Exam:  General: A/Ox3; pleasant and cooperative, NAD, supplemental oxygen- saturation 84%/ 3L, 91% on 4 L here , talkative SKIN: ndry scaling thickened skin on elbows. NODES: no lymphadenopathy HEENT: [place]/AT, EOM- WNL, Conjuctivae- clear, PERRLA, TM-WNL, Nose- mucosal erosion, Throat- clear and wnl, Mallampatii II, oral mucosa ok NECK: Supple w/ fair ROM, JVD- none, normal carotid impulses w/o bruits Thyroid- CHEST: Congested cough, rattles but not productive, few cracles or rales right base. HEART: RRR, 2/6 systolic precordial murmur ABDOMEN:  VWU:JWJX, nl pulses, no edema  NEURO: Grossly intact to observation      Impression & Recommendations:  Problem # 1:  CHRONIC OBSTRUCTIVE ASTHMA UNSPECIFIED (ICD-493.20) Based on the cough, I suspect her increasing dyspnea is an exacerbation of her  COPD, but need to exclude worsening CHF without right heart failure signs. We will get CXR, BNP, CBC and BMP. Give depo, short course prednisone while waiting.  Medications Added to Medication List This Visit: 1)  Vitamin B-12 100 Mcg Tabs (Cyanocobalamin) .... Take 1 by mouth once daily  Other Orders: Est. Patient Level III (95621) Depo- Medrol 80mg  (J1040) Admin of Therapeutic Inj  intramuscular or subcutaneous (30865) Xopenex 0.63mg  (H8469) Nebulizer Tx (62952) TLB-BMP (Basic Metabolic Panel-BMET) (80048-METABOL) TLB-CBC Platelet - w/Differential (85025-CBCD) TLB-BNP (B-Natriuretic Peptide) (83880-BNPR) T-2 View CXR (71020TC)  Patient Instructions: 1)  Please schedule a follow-up appointment in 3 weeks. 2)  neb xop 0.63 3)  depo 80 4)  lab 5)  A chest x-ray has been recommended.  Your imaging study may require preauthorization.  6)  prednisone 5 mg: 4 x 2  days, 3 x 2 days, 2 x 2 days, then 1 x 2 days..........Marland Kitchenusing your current supply   Medication Administration  Injection # 1:    Medication: Depo- Medrol 80mg     Diagnosis: CHRONIC OBSTRUCTIVE ASTHMA UNSPECIFIED (ICD-493.20)    Route: IM    Site: RUOQ gluteus    Exp Date: 09/2012    Lot #: [id number]    Mfr: Pharmacia    Patient tolerated injection without complications    Given by: Zackery Barefoot CMA (February 09, 2010 10:00 AM)  Medication # 1:    Medication: Xopenex 0.63mg     Diagnosis: CHRONIC OBSTRUCTIVE ASTHMA UNSPECIFIED (ICD-493.20)    Dose: 1 vial    Route: inhaled    Exp Date: 04/11    Lot #: [id number]    Mfr: [id number]    Patient tolerated medication without complications    Given by: Zackery Barefoot CMA (February 09, 2010 10:01 AM)  Orders Added: 1)  Est. Patient Level III [66440] 2)  Depo- Medrol 80mg  [J1040] 3)  Admin of Therapeutic Inj  intramuscular or subcutaneous [96372] 4)  Xopenex 0.63mg  [J7614] 5)  Nebulizer Tx [94640] 6)  TLB-BMP (Basic Metabolic Panel-BMET) [80048-METABOL] 7)  TLB-CBC Platelet - w/Differential [85025-CBCD] 8)  TLB-BNP (B-Natriuretic Peptide) [83880-BNPR] 9)  T-2 View CXR [71020TC]

## 2010-12-07 NOTE — Assessment & Plan Note (Signed)
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 returns for follow-up of her rhinitis and asthma.  She expects her problems to be worse in mid May.  So far this spring she's doing pretty well.  She had quit allergy vaccine in 2004.  There has been mild nasal congestion, but no purulence or bloody discharge, or headaches.  Her eyes water a little, but not badly.  She is not wheezing.  She has not had recurrence of urticaria.  January 30, 2010- Asthma, rhinosiusitis, allergic rhinitis Biaxin was called in a few weeks ago and cleared the color of a green bronchitis. sSnce then cough has persisted producing clear mucus. Deneis fever, wheeze, dyspnea. She says encasings on pillow did help with allergic rhinitis. She thinks there may be some postnasal drip, but no itch or sneeze  April 20, 2010- Asthma, rhinosinusitis, allergic rhinitis, hemoptysis, hx AVR. AF/ coumadin Finished Dulera trial. She liked it, but will return to her leftover Symbicort for now. Now taking amoxacillin for bronchits. She likes having it available.  Started coughing red blood small clots. Due for coumadin check at Dr Angelina Pih on 6/22. Her usual goal is 2.5 INR on 03/21/10.Started bleeding before the antibiotic.  C/O orthostatic dizziness x 3 days. Some vertigo. Yesterday went on and off most of the day. Swishing noise ? left ear w/out tinnitus. Much sneezing and nose  blowing. She would like to restart Singulair for this. Continues allergy vaccine. Uses a little liquid benadryl for sleep.     Preventive Screening-Counseling & Management  Alcohol-Tobacco     Smoking Status:  quit > 6 months     Tobacco Counseling: to remain off tobacco products  Current Medications (verified): 1)  Symbicort 160-4.5 Mcg/act  Aero (Budesonide-Formoterol Fumarate) .... Inhale 2 Puffs Two Times A Day 2)  Spiriva Handihaler 18 Mcg  Caps (Tiotropium Bromide Monohydrate) .... Inhale Contents of 1 Capsule Once A Day 3)  Xopenex Hfa 45 Mcg/act  Aero (Levalbuterol Tartrate) .... 2 Puffs Four Times A Day As Needed 4)  Oxygen 2.5 - 3 Liters .... 24/7 5)  Xopenex 1.25 Mg/22ml  Nebu (Levalbuterol Hcl) .... Three Times A Day 6)  Allergy Vaccine 1:10 Gh .... Starting Build Up 7)  Childrens Allergy 12.5 Mg/38ml Liqd (Diphenhydramine Hcl) .... As Directed On Bottle 8)  Furosemide 40 Mg  Tabs (Furosemide) .... 1/2 Two Times A Day 9)  Lanoxin 0.125 Mg  Tabs (Digoxin) .... Take 1 Tablet By Mouth Once A Day 10)  Cardizem La 240 Mg  Tb24 (Diltiazem Hcl Coated Beads) .Marland Kitchen.. 1 By Mouth Qd 11)  Klor-Con 20 Meq  Pack (Potassium Chloride) .... Take 1 Tablet By Mouth Two Times A Day 12)  Zetia 10 Mg  Tabs (Ezetimibe) .Marland Kitchen.. 1 By Mouth Qd 13)  Warfarin Sodium 1 Mg  Tabs (Warfarin Sodium) .... As Directed 14)  Zoloft 50 Mg  Tabs (Sertraline Hcl) .... Take 1 Tablet By Mouth Once A Day 15)  Mucinex Maximum Strength 1200 Mg Xr12h-Tab (Guaifenesin) .... Take 1 Tablet By Mouth Two Times A Day 16)  Vitamin C 500 Mg  Tabs (Ascorbic Acid) .... Take 1 Tablet By Mouth Once A Day 17)  Stool Softener .... Take 1  Tablet By Mouth Once A Day 18)  Cvs Saline Nasal Spray 0.65 %  Soln (Saline) .... As Needed 19)  Coq10 100 Mg  Caps (Coenzyme Q10) .... Take 1 Tablet By Mouth Once A Day 20)  Multivitamins   Tabs (Multiple Vitamin) .... Take 1 Tablet By Mouth Once A Day 21)  Benzonatate 100 Mg Caps (Benzonatate) .Marland Kitchen.. 1 or 2 Four Times A Day As Needed 22)  Prilosec 20 Mg Cpdr (Omeprazole) .... Take 2 Tablet By Mouth Two Times A Day 23)  Calcium Citrate +  Tabs (Multiple Minerals-Vitamins) .Marland Kitchen.. 1 Once Daily 24)  Aerochamber Z-Stat Plus   Misc (Spacer/aero-Holding Chambers) .... Use With Inhaler As Directed 25)  Prednisone 5 Mg Tabs (Prednisone) .Marland Kitchen.. 1-4 Daily When Needed For Breathing Up To 14 Days. 26)  Sotalol Hcl 80 Mg Tabs (Sotalol Hcl) .... 2 Once Daily 27)  Vitamin B-12 100 Mcg Tabs (Cyanocobalamin) .... Take 1 By Mouth Once Daily  Allergies (verified): 1)  ! Sulfa 2)  Codeine 3)  * Mycins 4)  Cephalexin 5)  * Estrace 6)  * Analopram 7)  * Promethazine With Codeine 8)  * Chemdal 9)  * Hydrocortisone 10)  * Avelox  Past History:  Past Medical History: Last updated: 03/09/2009 ASTHMATIC BRONCHITIS, ACUTE (ICD-466.0) ALLERGIC RHINITIS (ICD-477.9) Hx of ATRIAL FIBRILLATION (ICD-427.31) S/P AORTIC VALVE REPLACEMENT ([id number])...................................................Marland KitchenTennant Hx of SLEEP APNEA, MILD (ICD-780.57) CHRONIC OBSTRUCTIVE ASTHMA UNSPECIFIED (ICD-493.20) OSTEOPOROSIS (ICD-733.00) FATIGUE, CHRONIC (ICD-780.79) PRURITIC DISORDER NOS (ICD-698.9)  Past Surgical History: Last updated: 05/30/2009 back surgery 1994 Tonsillectomy Appendectomy ganglionectomy vocal cord polyps 1975 and 1984 pneumonia 1980 atrial fibrillation pneumonia 10/2003 aortic valve replacement 02/2005 Pace maker June 2010  Family History: Last updated: 02/17/2008 father renal failure paternal aunt colon cancer mother value disease  CABG MATERNAL GRANDMOTHER CVA MATERNAL GRANDFATHER HEART DISEASE PATERNAL GRANDFATHER mi AUNT HTN  Social History: Last updated: 11/03/2007 Patient states former smoker- quit 1995 married  Risk Factors: Exercise: yes (10/23/2007)  Risk Factors: Smoking Status: quit > 6 months (04/20/2010)  Social History: Smoking Status:  quit > 6 months  Review of Systems      See HPI       The patient complains of shortness of breath with activity, productive cough, coughing up blood, and sneezing.  The patient denies shortness of breath at rest, non-productive cough, chest pain,  irregular heartbeats, acid heartburn, indigestion, loss of appetite, weight change, abdominal pain, difficulty swallowing, sore throat, tooth/dental problems, headaches, and nasal congestion/difficulty breathing through nose.         dizziness  Vital Signs:  Patient profile:   77 year old female Height:      62.5 inches Weight:      136 pounds BMI:     24.57 O2 Sat:      96 % on 3 L/min Pulse rate:   60 / minute BP sitting:   126 / 70  (left arm) Cuff size:   regular  Vitals Entered By: Reynaldo Minium CMA (April 20, 2010 2:45 PM)  O2 Flow:  3 L/min  Physical Exam  Additional Exam:  General: A/Ox3; pleasant and cooperative, NAD, supplemental oxygen- saturation 96% at rest on 3L SKIN: ndry scaling thickened skin on elbows. NODES: no lymphadenopathy HEENT: [place]/AT, EOM- WNL, Conjuctivae- clear, PERRLA, TM-WNL, Nose- mucosal erosion, Throat- clear and wnl, Mallampatii II, oral mucosa ok NECK: Supple w/ fair ROM, JVD- none, normal carotid impulses w/o bruits Thyroid- CHEST: crackles right lateral chest, no dullness, unlabored HEART: RRR, 2/6 systolic precordial  murmur ABDOMEN: soft ZOX:WRUE, nl pulses, no edema  NEURO: Grossly intact to observation, no nystagmus      Impression & Recommendations:  Problem # 1:  DYSPNEA (ICD-786.05) Multifactorial, without progression since last here. Continues supplemental oxygen She will return to Symbicort. Will give sample/ script Singulair.  Problem # 2:  ORTHOSTATIC DIZZINESS (ICD-780.4)  This seems orthostatic. BP and pulse are ok. There is no nystagmus and no carotid or vetebral bruit. BP is ok. Cautioned to avoid sudden positional changes. Watch for vertebrobasilar disese or BPV. She will discuss with her primary.  Problem # 3:  HEMOPTYSIS (ICD-786.3) Bleeding likely because of her coumadin. We will get CXR, check PT early  and send result to Dr Deborah Chalk. Her updated medication list for this problem includes:    Symbicort 160-4.5  Mcg/act Aero (Budesonide-formoterol fumarate) ..... Inhale 2 puffs two times a day    Spiriva Handihaler 18 Mcg Caps (Tiotropium bromide monohydrate) ..... Inhale contents of 1 capsule once a day    Xopenex Hfa 45 Mcg/act Aero (Levalbuterol tartrate) .Marland Kitchen... 2 puffs four times a day as needed    Xopenex 1.25 Mg/35ml Nebu (Levalbuterol hcl) .Marland Kitchen... Three times a day    Vitamin C 500 Mg Tabs (Ascorbic acid) .Marland Kitchen... Take 1 tablet by mouth once a day    Multivitamins Tabs (Multiple vitamin) .Marland Kitchen... Take 1 tablet by mouth once a day  Other Orders: Est. Patient Level IV (99214) T-2 View CXR (71020TC) TLB-CBC Platelet - w/Differential (85025-CBCD) TLB-PTT (85730-PTTL) TLB-PT (Protime) (85610-PTP)  Patient Instructions: 1)  Please schedule a follow-up appointment in 3 months. 2)  Change Dulera back to Symbicort 160/4.5, 2 puffs and rinse mouth twice daily 3)  sample Singulair, 1 daily. Call for script if helpful. 4)  A chest x-ray has been recommended.  Your imaging study may require preauthorization.  5)  Lab

## 2010-12-07 NOTE — Consult Note (Signed)
Summary: [facility]   Imported By: Sherian Rein 07/18/2010 07:23:28  _____________________________________________________________________  External Attachment:    Type:   Image     Comment:   External Document

## 2010-12-07 NOTE — Progress Notes (Signed)
Summary: DrTennant's office req. work- in today   Phone Note From Other Clinic Call back at [phone]   Caller: Amy from Dr Ronnald Nian office Call For: Dr Marina Goodell Reason for Call: Schedule Patient Appt Summary of Call: Dr Deborah Chalk would like this patient seen today for rectal bleeding, passing mucus, extremely constipated and meds are not helping. Initial call taken by: Tawni Levy,  June 02, 2010 9:49 AM  Follow-up for Phone Call        Pt. was seen  in Dr.Tennant's office on 05/31/10 with c/o chest pain.. Cardiac labs did not show cardiac source.,so pt. was started on Nexium.Phone call  rec'd.from to them and pt. said she was bloated and constipated so she re-started Miralax q.d. Yesterday and today she passed only very sm amt's of hard stool balls and has seen mucus each time with some minor BRBand she remains bloated. .Dr.Tennant is out of office and PA instructed  Amy to call here to  have pt.  seen here today as she is not familair with GI. issues.  Follow-up by: Teryl Lucy RN,  June 02, 2010 10:40 AM  Additional Follow-up for Phone Call Additional follow up Details #1::        INCREASE MIRALAX TO ACHIEVE GOOD BM Additional Follow-up by: Hilarie Fredrickson MD,  June 02, 2010 11:06 AM    Additional Follow-up for Phone Call Additional follow up Details #2::    Dr.Tennant's office will instruct pt. on  proper use of Miaralax. Follow-up by: Teryl Lucy RN,  June 02, 2010 11:53 AM

## 2010-12-07 NOTE — Letter (Signed)
Summary: Remote Device Check  [facility], Main Office  [address]   [place], [place] [address]   Phone: [phone]  Fax: [phone]     October 20, 2010 MRN: [id number]   [facility] [address] [place], [place]  [address]   Dear Ms. Falero,   Your remote transmission was recieved and reviewed by your physician.  All diagnostics were within normal limits for you.   __X____Your next office visit is scheduled for: 01-16-11 @ 1040 with Dr Ladona Ridgel.   Sincerely,  Vella Kohler

## 2010-12-07 NOTE — Progress Notes (Signed)
Summary: prescript  Phone Note Call from Patient   Caller: Patient Call For: Kinan Safley Summary of Call: [facility] have not received prescript for xopenex and symbicort Initial call taken by: Rickard Patience,  January 26, 2010 2:20 PM  Follow-up for Phone Call        pt advised rx sent. Carron Curie CMA  January 26, 2010 3:30 PM     Prescriptions: XOPENEX HFA 45 MCG/ACT  AERO (LEVALBUTEROL TARTRATE) 2 puffs four times a day as needed  #3 x 0   Entered by:   Carron Curie CMA   Authorized by:   Waymon Budge MD   Signed by:   Carron Curie CMA on 01/26/2010   Method used:   Faxed to ...       [facility]* (mail-order)             ,          Ph: [phone]       Fax: [phone]   RxID:   [id number] SYMBICORT 160-4.5 MCG/ACT  AERO (BUDESONIDE-FORMOTEROL FUMARATE) Inhale 2 puffs two times a day  #3 x 0   Entered by:   Carron Curie CMA   Authorized by:   Waymon Budge MD   Signed by:   Carron Curie CMA on 01/26/2010   Method used:   Faxed to ...       [facility]* (mail-order)             ,          Ph: [phone]       Fax: [phone]   RxID:   [id number]

## 2010-12-07 NOTE — Assessment & Plan Note (Signed)
Summary: rov 3 months///kp   Copy to:  n.a Primary Provider/Referring Provider:  [name], MD  CC:  3 month follow up visit-allergies; still having SOB at times..  History of Present Illness: January 30, 2010- Asthma, rhinosiusitis, allergic rhinitis Biaxin was called in a few weeks ago and cleared the color of a green bronchitis. sSnce then cough has persisted producing clear mucus. Deneis fever, wheeze, dyspnea. She says encasings on pillow did help with allergic rhinitis. She thinks there may be some postnasal drip, but no itch or sneeze  April 20, 2010- Asthma, rhinosinusitis, allergic rhinitis, hemoptysis, hx AVR. AF/ coumadin Finished Dulera trial. She liked it, but will return to her leftover Symbicort for now. Now taking amoxacillin for bronchits. She likes having it available.  Started coughing red blood small clots. Due for coumadin check at Dr Angelina Pih on 6/22. Her usual goal is 2.5 INR on 03/21/10.Started bleeding before the antibiotic.  C/O orthostatic dizziness x 3 days. Some vertigo. Yesterday went on and off most of the day. Swishing noise ? left ear w/out tinnitus. Much sneezing and nose  blowing. She would like to restart Singulair for this. Continues allergy vaccine. Uses a little liquid benadryl for sleep.   July 20, 2010-  Can't walk any without continuous flow oxygen at 3 L. If she sits, sats quickly go up to 98% on 2-3 L.. Gets a nose bleed about once a month.  Continues allergy vaccine at 1:10. Humidity bothered her. She continues Dulera 200 without a spacer and she think it helps better than Symbicort. Today her lungs feel clear. CXR- 04/10/10- CE s/pAVR/ pacemaker, scarring and COPD changes CBC- Hgb 1.6, platelets 177K.    Asthma History    Initial Asthma Severity Rating:    Age range: 12+ years    Symptoms: daily    Nighttime Awakenings: 0-2/month    Interferes w/ normal activity: extreme limitations    SABA use (not for EIB): daily    Asthma Severity  Assessment: Severe Persistent   Preventive Screening-Counseling & Management  Alcohol-Tobacco     Smoking Status: quit > 6 months     Year Quit: 1995     Pack years: 50 years 1 pack daily     Tobacco Counseling: to remain off tobacco products  Current Medications (verified): 1)  Dulera 200-5 Mcg/act Aero (Mometasone Furo-Formoterol Fum) .... 2 Puffs Two Times A Day As Needed 2)  Spiriva Handihaler 18 Mcg  Caps (Tiotropium Bromide Monohydrate) .... Inhale Contents of 1 Capsule Once A Day 3)  Xopenex Hfa 45 Mcg/act  Aero (Levalbuterol Tartrate) .... 2 Puffs Four Times A Day As Needed 4)  Oxygen 2.5 - 3 Liters .... 24/7 5)  Xopenex 1.25 Mg/13ml  Nebu (Levalbuterol Hcl) .... Three Times A Day 6)  Allergy Vaccine 1:10 Gh .... Starting Build Up 7)  Childrens Allergy 12.5 Mg/29ml Liqd (Diphenhydramine Hcl) .... As Directed On Bottle 8)  Furosemide 40 Mg  Tabs (Furosemide) .... 1/2 Two Times A Day 9)  Lanoxin 0.125 Mg  Tabs (Digoxin) .... Take 1 Tablet By Mouth Once A Day 10)  Cardizem La 240 Mg  Tb24 (Diltiazem Hcl Coated Beads) .Marland Kitchen.. 1 By Mouth Once Daily **appointment Due For Additional Refills** 11)  Klor-Con 20 Meq  Pack (Potassium Chloride) .... Take 1 Tablet By Mouth Two Times A Day 12)  Zetia 10 Mg  Tabs (Ezetimibe) .Marland Kitchen.. 1 By Mouth Qd 13)  Warfarin Sodium 1 Mg  Tabs (Warfarin Sodium) .... As Directed 14)  Zoloft 50 Mg  Tabs (Sertraline Hcl) .... Take 1 Tablet By Mouth Once A Day 15)  Mucinex Maximum Strength 1200 Mg Xr12h-Tab (Guaifenesin) .... Take 1 Tablet By Mouth Two Times A Day 16)  Vitamin C 500 Mg  Tabs (Ascorbic Acid) .... Take 1 Tablet By Mouth Once A Day 17)  Stool Softener .... Take 1 Tablet By Mouth Once A Day 18)  Cvs Saline Nasal Spray 0.65 %  Soln (Saline) .... As Needed 19)  Coq10 100 Mg  Caps (Coenzyme Q10) .... Take 1 Tablet By Mouth Once A Day 20)  Multivitamins   Tabs (Multiple Vitamin) .... Take 1 Tablet By Mouth Once A Day 21)  Benzonatate 100 Mg Caps (Benzonatate)  .Marland Kitchen.. 1 or 2 Four Times A Day As Needed 22)  Nexium 40 Mg Cpdr (Esomeprazole Magnesium) .... Take 1 Tablet By Mouth Once A Day 23)  Calcium Citrate +  Tabs (Multiple Minerals-Vitamins) .Marland Kitchen.. 1 Once Daily 24)  Aerochamber Z-Stat Plus  Misc (Spacer/aero-Holding Chambers) .... Use With Inhaler As Directed 25)  Prednisone 5 Mg Tabs (Prednisone) .Marland Kitchen.. 1-4 Daily When Needed For Breathing Up To 14 Days. 26)  Sotalol Hcl 80 Mg Tabs (Sotalol Hcl) .... 2 Once Daily 27)  Vitamin B-12 100 Mcg Tabs (Cyanocobalamin) .... Take 1 By Mouth Once Daily 28)  Singulair 10 Mg Tabs (Montelukast Sodium) .Marland Kitchen.. 1 Daily For Asthma 29)  Lipitor (Unknown Dosage) .... Take 1 Tablet By Mouth Once A Day  Allergies (verified): 1)  ! Sulfa 2)  Codeine 3)  * Mycins 4)  Cephalexin 5)  * Estrace 6)  * Analopram 7)  * Promethazine With Codeine 8)  * Chemdal 9)  * Hydrocortisone 10)  * Avelox  Past History:  Past Medical History: Last updated: 07/14/2010 ASTHMATIC BRONCHITIS, ACUTE (ICD-466.0) ALLERGIC RHINITIS (ICD-477.9) Hx of ATRIAL FIBRILLATION (ICD-427.31) S/P AORTIC VALVE REPLACEMENT ([id number])...................................................Marland KitchenTennant Hx of SLEEP APNEA, MILD (ICD-780.57) CHRONIC OBSTRUCTIVE ASTHMA UNSPECIFIED (ICD-493.20) OSTEOPOROSIS (ICD-733.00) FATIGUE, CHRONIC (ICD-780.79) PRURITIC DISORDER NOS (ICD-698.9) Diverticulosis Hemorrhoids GERD  Past Surgical History: Last updated: 07/17/2010 back surgery 1994 Tonsillectomy Appendectomy ganglionectomy vocal cord polyps 1975 and 1984 pneumonia 1980 atrial fibrillation pneumonia 10/2003 aortic valve replacement 02/2005 Pace maker June 2010 Cardiac Ablation  Family History: Last updated: 07/17/2010 father renal failure paternal aunt colon cancer mother value disease  CABG MATERNAL GRANDMOTHER CVA MATERNAL GRANDFATHER HEART DISEASE PATERNAL GRANDFATHER mi AUNT HTN Family History of Breast Cancer: Daughter  Social History: Last  updated: 07/17/2010 Patient states former smoker- quit 1995 married Alcohol Use - no Illicit Drug Use - no  Risk Factors: Exercise: yes (10/23/2007)  Risk Factors: Smoking Status: quit > 6 months (07/20/2010)  Vital Signs:  Patient profile:   77 year old female Height:      62.5 inches Weight:      136.38 pounds BMI:     24.64 O2 Sat:      98 % on 3 L/min Pulse rate:   61 / minute BP sitting:   124 / 64  (left arm) Cuff size:   regular  Vitals Entered By: Reynaldo Minium CMA (July 20, 2010 11:43 AM)  O2 Flow:  3 L/min CC: 3 month follow up visit-allergies; still having SOB at times.   Physical Exam  Additional Exam:  General: A/Ox3; pleasant and cooperative, NAD, supplemental oxygen- saturation 98% at rest on 3L SKIN: ndry scaling thickened skin on elbows. NODES: no lymphadenopathy HEENT: [place]/AT, EOM- WNL, Conjuctivae- clear, PERRLA, TM-WNL, Nose- mucosal erosion, Throat- clear and wnl, Mallampatii  II, oral mucosa ok NECK: Supple w/ fair ROM, JVD- none, normal carotid impulses w/o bruits Thyroid- CHEST: diminished but clear, no dullness, unlabored HEART: RRR, 2/6 systolic precordial murmur ABDOMEN: soft FIE:PPIR, nl pulses, no edema  NEURO: Grossly intact to observation, no nystagmus      CXR  Procedure date:  04/20/2010  Findings:       CHEST - 2 VIEW   Comparison: 02/09/2010   Findings: Cardiac enlargement status post median sternotomy and AVR. Calcified tortuous thoracic aorta. Right subclavian sequential transvenous pacemaker leads unchanged, projecting at right atrium and right ventricle. Pulmonary vascularity normal. Emphysematous and chronic bronchitic changes compatible with COPD. Chronic interstitial lung disease changes present in mid-to-lower lungs bilaterally. No definite acute infiltrate or pleural effusion. No pneumothorax. Bones appear demineralized.   IMPRESSION: COPD with chronic bronchitic and chronic interstitial lung  disease changes. Cardiomegaly status post AVR and pacemaker.   Read By:  Lollie Marrow,  M.D.     Released By:  Lollie Marrow,  M.D.  Impression & Recommendations:  Problem # 1:  HEMOPTYSIS (ICD-786.3)  She still notices a little brown sputum about once daily, but no definite blood despite Warfarin, and no change or obvious source on CXR. We will watch this issue. Her updated medication list for this problem includes:    Dulera 200-5 Mcg/act Aero (Mometasone furo-formoterol fum) .Marland Kitchen... 2 puffs two times a day as needed    Spiriva Handihaler 18 Mcg Caps (Tiotropium bromide monohydrate) ..... Inhale contents of 1 capsule once a day    Xopenex Hfa 45 Mcg/act Aero (Levalbuterol tartrate) .Marland Kitchen... 2 puffs four times a day as needed    Xopenex 1.25 Mg/55ml Nebu (Levalbuterol hcl) .Marland Kitchen... Three times a day    Vitamin C 500 Mg Tabs (Ascorbic acid) .Marland Kitchen... Take 1 tablet by mouth once a day    Multivitamins Tabs (Multiple vitamin) .Marland Kitchen... Take 1 tablet by mouth once a day    Singulair 10 Mg Tabs (Montelukast sodium) .Marland Kitchen... 1 daily for asthma  Problem # 2:  ALLERGIC RHINITIS (ICD-477.9)  She continues allergy vaccine and feels it helps her. Her updated medication list for this problem includes:    Childrens Allergy 12.5 Mg/62ml Liqd (Diphenhydramine hcl) .Marland Kitchen... As directed on bottle    Cvs Saline Nasal Spray 0.65 % Soln (Saline) .Marland Kitchen... As needed  Problem # 3:  CHRONIC OBSTRUCTIVE ASTHMA UNSPECIFIED (ICD-493.20) She continues to be oxygen dependent, but hasn't changed acutely and is actually fairly stable for early Fall weather.  Most of her respiratory problems are from COPD rather than asthma.  Other Orders: Est. Patient Level IV (51884) Flu Vaccine 73yrs + MEDICARE PATIENTS (Z6606) Administration Flu vaccine - MCR (T0160)  Patient Instructions: 1)  Please schedule a follow-up appointment in 4 months. 2)  Flu vax     CXR  Procedure date:  04/20/2010  Findings:       CHEST - 2 VIEW    Comparison: 02/09/2010   Findings: Cardiac enlargement status post median sternotomy and AVR. Calcified tortuous thoracic aorta. Right subclavian sequential transvenous pacemaker leads unchanged, projecting at right atrium and right ventricle. Pulmonary vascularity normal. Emphysematous and chronic bronchitic changes compatible with COPD. Chronic interstitial lung disease changes present in mid-to-lower lungs bilaterally. No definite acute infiltrate or pleural effusion. No pneumothorax. Bones appear demineralized.   IMPRESSION: COPD with chronic bronchitic and chronic interstitial lung disease changes. Cardiomegaly status post AVR and pacemaker.   Read By:  Lollie Marrow,  Judie Petit.D.  Released By:  Lollie Marrow,  M.D.  Flu Vaccine Consent Questions     Do you have a history of severe allergic reactions to this vaccine? no    Any prior history of allergic reactions to egg and/or gelatin? no    Do you have a sensitivity to the preservative Thimersol? no    Do you have a past history of Guillan-Barre Syndrome? no    Do you currently have an acute febrile illness? no    Have you ever had a severe reaction to latex? no    Vaccine information given and explained to patient? yes    Are you currently pregnant? no    Lot Number:[id number]   Exp Date:05/05/2011   Site Given Right Deltoid IMedflu  Elray Buba RN  July 20, 2010 12:28 PM

## 2010-12-07 NOTE — Procedures (Signed)
Summary: [employer]   Imported By: Sherian Rein 07/18/2010 07:16:14  _____________________________________________________________________  External Attachment:    Type:   Image     Comment:   External Document

## 2010-12-07 NOTE — Assessment & Plan Note (Signed)
Summary: constipation--ch.    History of Present Illness Visit Type: Initial Visit Primary GI MD: Yancey Flemings MD Primary Provider: [name], MD Requesting Provider: n/a Chief Complaint: patient c/o severe constipation. She has improved some with miralax once daily.  History of Present Illness:   77 year old female with multiple significant medical problems including oxygen-dependent COPD, prior aortic valve replacement, atrial fibrillation on chronic Coumadin therapy, hyperlipidemia, GERD, remote history of colon polyps, colonic AVMs, history of iron deficiency anemia, and irritable bowel syndrome. Yesterday for evaluation of constipation. She has had this problem chronically. She was treated with MiraLax. Initially 3 doses per day. This has been titrated one dose per day. On this regimen she has daily bowel movements. She also takes fiber cereal. She complains of chronic abdominal bloating. She takes Nexium for GERD. She is on innumerable medications as listed.   GI Review of Systems    Reports acid reflux, belching, bloating, and  loss of appetite.      Denies abdominal pain, chest pain, dysphagia with liquids, dysphagia with solids, heartburn, nausea, vomiting, vomiting blood, weight loss, and  weight gain.      Reports constipation, hemorrhoids, and  irritable bowel syndrome.     Denies anal fissure, black tarry stools, change in bowel habit, diarrhea, diverticulosis, fecal incontinence, heme positive stool, jaundice, light color stool, liver problems, rectal bleeding, and  rectal pain. Preventive Screening-Counseling & Management      Drug Use:  no.      Current Medications (verified): 1)  Dulera 200-5 Mcg/act Aero (Mometasone Furo-Formoterol Fum) .... 2 Puffs Two Times A Day As Needed 2)  Spiriva Handihaler 18 Mcg  Caps (Tiotropium Bromide Monohydrate) .... Inhale Contents of 1 Capsule Once A Day 3)  Xopenex Hfa 45 Mcg/act  Aero (Levalbuterol Tartrate) .... 2 Puffs Four Times  A Day As Needed 4)  Oxygen 2.5 - 3 Liters .... 24/7 5)  Xopenex 1.25 Mg/30ml  Nebu (Levalbuterol Hcl) .... Three Times A Day 6)  Allergy Vaccine 1:10 Gh .... Starting Build Up 7)  Childrens Allergy 12.5 Mg/53ml Liqd (Diphenhydramine Hcl) .... As Directed On Bottle 8)  Furosemide 40 Mg  Tabs (Furosemide) .... 1/2 Two Times A Day 9)  Lanoxin 0.125 Mg  Tabs (Digoxin) .... Take 1 Tablet By Mouth Once A Day 10)  Cardizem La 240 Mg  Tb24 (Diltiazem Hcl Coated Beads) .Marland Kitchen.. 1 By Mouth Once Daily **appointment Due For Additional Refills** 11)  Klor-Con 20 Meq  Pack (Potassium Chloride) .... Take 1 Tablet By Mouth Two Times A Day 12)  Zetia 10 Mg  Tabs (Ezetimibe) .Marland Kitchen.. 1 By Mouth Qd 13)  Warfarin Sodium 1 Mg  Tabs (Warfarin Sodium) .... As Directed 14)  Zoloft 50 Mg  Tabs (Sertraline Hcl) .... Take 1 Tablet By Mouth Once A Day 15)  Mucinex Maximum Strength 1200 Mg Xr12h-Tab (Guaifenesin) .... Take 1 Tablet By Mouth Two Times A Day 16)  Vitamin C 500 Mg  Tabs (Ascorbic Acid) .... Take 1 Tablet By Mouth Once A Day 17)  Stool Softener .... Take 1 Tablet By Mouth Once A Day 18)  Cvs Saline Nasal Spray 0.65 %  Soln (Saline) .... As Needed 19)  Coq10 100 Mg  Caps (Coenzyme Q10) .... Take 1 Tablet By Mouth Once A Day 20)  Multivitamins   Tabs (Multiple Vitamin) .... Take 1 Tablet By Mouth Once A Day 21)  Benzonatate 100 Mg Caps (Benzonatate) .Marland Kitchen.. 1 or 2 Four Times A Day As Needed 22)  Nexium 40 Mg Cpdr (Esomeprazole Magnesium) .... Take 1 Tablet By Mouth Once A Day 23)  Calcium Citrate +  Tabs (Multiple Minerals-Vitamins) .Marland Kitchen.. 1 Once Daily 24)  Aerochamber Z-Stat Plus  Misc (Spacer/aero-Holding Chambers) .... Use With Inhaler As Directed 25)  Prednisone 5 Mg Tabs (Prednisone) .Marland Kitchen.. 1-4 Daily When Needed For Breathing Up To 14 Days. 26)  Sotalol Hcl 80 Mg Tabs (Sotalol Hcl) .... 2 Once Daily 27)  Vitamin B-12 100 Mcg Tabs (Cyanocobalamin) .... Take 1 By Mouth Once Daily 28)  Singulair 10 Mg Tabs (Montelukast  Sodium) .Marland Kitchen.. 1 Daily For Asthma 29)  Lipitor (Unknown Dosage) .... Take 1 Tablet By Mouth Once A Day  Allergies (verified): 1)  ! Sulfa 2)  Codeine 3)  * Mycins 4)  Cephalexin 5)  * Estrace 6)  * Analopram 7)  * Promethazine With Codeine 8)  * Chemdal 9)  * Hydrocortisone 10)  * Avelox  Past History:  Past Medical History: Reviewed history from 07/14/2010 and no changes required. ASTHMATIC BRONCHITIS, ACUTE (ICD-466.0) ALLERGIC RHINITIS (ICD-477.9) Hx of ATRIAL FIBRILLATION (ICD-427.31) S/P AORTIC VALVE REPLACEMENT ([id number])...................................................Marland KitchenTennant Hx of SLEEP APNEA, MILD (ICD-780.57) CHRONIC OBSTRUCTIVE ASTHMA UNSPECIFIED (ICD-493.20) OSTEOPOROSIS (ICD-733.00) FATIGUE, CHRONIC (ICD-780.79) PRURITIC DISORDER NOS (ICD-698.9) Diverticulosis Hemorrhoids GERD  Past Surgical History: back surgery 1994 Tonsillectomy Appendectomy ganglionectomy vocal cord polyps 1975 and 1984 pneumonia 1980 atrial fibrillation pneumonia 10/2003 aortic valve replacement 02/2005 Pace maker June 2010 Cardiac Ablation  Family History: father renal failure paternal aunt colon cancer mother value disease  CABG MATERNAL GRANDMOTHER CVA MATERNAL GRANDFATHER HEART DISEASE PATERNAL GRANDFATHER mi AUNT HTN Family History of Breast Cancer: Daughter  Social History: Patient states former smoker- quit 1995 married Alcohol Use - no Illicit Drug Use - no Drug Use:  no  Review of Systems       The patient complains of allergy/sinus, back pain, coughing up blood, fatigue, headaches-new, heart murmur, itching, shortness of breath, skin rash, sleeping problems, swelling of feet/legs, and urine leakage.  The patient denies anemia, anxiety-new, arthritis/joint pain, blood in urine, breast changes/lumps, change in vision, confusion, cough, depression-new, fainting, fever, hearing problems, heart rhythm changes, menstrual pain, muscle pains/cramps, night sweats,  nosebleeds, pregnancy symptoms, sore throat, swollen lymph glands, thirst - excessive , urination - excessive , urination changes/pain, vision changes, and voice change.    Vital Signs:  Patient profile:   77 year old female Height:      62.5 inches Weight:      136.38 pounds BMI:     24.64 BSA:     1.64 Pulse rate:   56 / minute Pulse rhythm:   regular BP sitting:   128 / 58  (left arm) Cuff size:   regular  Vitals Entered By: Lamona Curl CMA Duncan Dull) (July 17, 2010 2:57 PM)  Physical Exam  General:  chronically ill appearing elderly female with nasal cannula oxygen in place. No acute distress. Head:  Normocephalic and atraumatic. Eyes:  PERRLA, no icterus. Mouth:  No deformity or lesions. No thrush Neck:  Supple; no masses or thyromegaly. Lungs:  distant breath sounds Heart:  regular with 2/6 systolic murmur Abdomen:  Soft,mildly protuberant, nontender and nondistended. No masses, hepatosplenomegaly or hernias noted. Normal bowel sounds. Msk:  Symmetrical with no gross deformities. Normal posture. Pulses:  Normal pulses noted. Extremities:  no edema Neurologic:  alert oriented Skin:  no jaundice Psych:  Alert and cooperative. Normal mood and affect.   Impression & Recommendations:  Problem # 1:  CONSTIPATION (ICD-564.00) chronic  functional constipation. Improved on MiraLax.  Plan: #1. Continue MiraLax daily. Titrate to need to achieve daily bowel movements  Problem # 2:  FLATULENCE-GAS-BLOATING (ICD-787.3) chronic bloating and gas. In part due to IBS. In part due to intrinsic lung disease. We discussed this in detail. Recommended decreasing dietary fiber supplementation to see if this helps some  Problem # 3:  IBS (ICD-564.1) Assessment: Unchanged  Patient Instructions: 1)  titrate MiraLax to achieve daily bowel movements 2)  GI followup p.r.n.

## 2010-12-07 NOTE — Procedures (Signed)
Summary: EGD/[facility]  EGD/[facility]   Imported By: Sherian Rein 07/18/2010 07:17:21  _____________________________________________________________________  External Attachment:    Type:   Image     Comment:   External Document

## 2010-12-07 NOTE — Miscellaneous (Signed)
Summary: Injection Record/[facility]  Injection Record/ Allergy   Imported By: Lanelle Bal 03/08/2010 13:48:34  _____________________________________________________________________  External Attachment:    Type:   Image     Comment:   External Document

## 2010-12-07 NOTE — Assessment & Plan Note (Signed)
Summary: sore throat, congestion//jrc   Primary Provider/Referring Provider:  hopper  CC:  Acute Visit.  having sore throat, cough with green and yellow mucus, wheezing, chest tightness, and and chest congestion since Sunday. c/o increased SOB with activity since Tuesday.  denies fever.Marland Kitchen  History of Present Illness: July 14, 2009--Presents for acute office visit. Pt c/o increased S.O.B, productive cough with yellow mucus worse over last 2 weeks. DOE has worsened over last month, wheeizng at night, sneezing during daytime. Decreased activity tolerance. Denies chest pain,  orthopnea, hemoptysis, fever, n/v/d, headache,recent.  9/23/210- COPD, Chronic resp failure, OSA, AF/ pacemaker, CHF/ diastolic/ AS rhinitis Continues oxygen. Antibiotic last visit lightened sputum for awhile, left some vaginal itching. Wheezing at night and feels she is drifting  back towards hospital trouble.Sleeps soundly but waking a little more at night.  Denies fever, sweat, blood, anginal pain, palpitation, cough, tussive chest pain. heart burn, reflux, nausea or diarrhea.  September 26, 2009- COPD, Chronic respiratory failure, OSA, AF/pacemaker, CHF/ diastolic/AS, rhinitis She never feels able to get from one room to another unless she takes prednisone 10 mg daily. Gets wheezey and short of breath with weather changes. Has not needed antibiotic.  November 10, 2009- COPD, Chronic respiratory failure, OSA, AF/ pacemaker, CHF Sore throat x 2 days, sinus congestion and drainage, chest tight, wheeze, more short of breath, cough productive green. Husband is also sick. She started amoxacillin 2 days ago. Got flu  vax.   Current Medications (verified): 1)  Symbicort 160-4.5 Mcg/act  Aero (Budesonide-Formoterol Fumarate) .... Inhale 2 Puffs Two Times A Day 2)  Spiriva Handihaler 18 Mcg  Caps (Tiotropium Bromide Monohydrate) .... Inhale Contents of 1 Capsule Once A Day 3)  Xopenex Hfa 45 Mcg/act  Aero (Levalbuterol  Tartrate) .... 2 Puffs Four Times A Day As Needed 4)  Oxygen 2.5 - 3 Liters .... 24/7 5)  Singulair 10 Mg  Tabs (Montelukast Sodium) .Marland Kitchen.. 1 By Mouth Qd 6)  Xopenex 1.25 Mg/61ml  Nebu (Levalbuterol Hcl) .... Three Times A Day 7)  Allergy Vaccine 1:10 Gh .... Starting Build Up 8)  Allegra Odt 30 Mg Tbdp (Fexofenadine Hcl) .... Take 1 By Mouth Once Daily As Needed 9)  Furosemide 40 Mg  Tabs (Furosemide) .... 1/2 Two Times A Day 10)  Lanoxin 0.125 Mg  Tabs (Digoxin) .... Take 1 Tablet By Mouth Once A Day 11)  Cardizem La 240 Mg  Tb24 (Diltiazem Hcl Coated Beads) .Marland Kitchen.. 1 By Mouth Qd 12)  Klor-Con 20 Meq  Pack (Potassium Chloride) .... Take 1 Tablet By Mouth Two Times A Day 13)  Zetia 10 Mg  Tabs (Ezetimibe) .Marland Kitchen.. 1 By Mouth Qd 14)  Warfarin Sodium 1 Mg  Tabs (Warfarin Sodium) .... As Directed 15)  Zoloft 50 Mg  Tabs (Sertraline Hcl) .... Take 1- 1 1/2 Tablet By Mouth Once A Day 16)  Mucinex Maximum Strength 1200 Mg Xr12h-Tab (Guaifenesin) .... Take 1 Tablet By Mouth Two Times A Day 17)  Vitamin C 500 Mg  Tabs (Ascorbic Acid) .... Take 1 Tablet By Mouth Once A Day 18)  Stool Softener .... Take 1 Tablet By Mouth Once A Day 19)  Cvs Saline Nasal Spray 0.65 %  Soln (Saline) .... As Needed 20)  Coq10 100 Mg  Caps (Coenzyme Q10) .... Take 1 Tablet By Mouth Once A Day 21)  Multivitamins   Tabs (Multiple Vitamin) .... Take 1 Tablet By Mouth Once A Day 22)  Benzonatate 100 Mg Caps (Benzonatate) .Marland KitchenMarland KitchenMarland Kitchen 1  or 2 Four Times A Day As Needed 23)  Prilosec 20 Mg Cpdr (Omeprazole) .... Take 2 Tablet By Mouth Two Times A Day 24)  Calcium Citrate +  Tabs (Multiple Minerals-Vitamins) .Marland Kitchen.. 1 Once Daily 25)  Aerochamber Z-Stat Plus  Misc (Spacer/aero-Holding Chambers) .... Use With Inhaler As Directed 26)  Amoxicillin 500 Mg Caps (Amoxicillin) .Marland Kitchen.. 1 Three Times A Day X 5 Days For Infection 27)  Prednisone 10 Mg Tabs (Prednisone) .Marland Kitchen.. 1-3 Daily When Needed For Breathing  Allergies: 1)  ! Sulfa 2)  Codeine 3)  *  Mycins 4)  Cephalexin 5)  * Estrace 6)  * Analopram 7)  * Promethazine With Codeine 8)  * Chemdal 9)  * Hydrocortisone 10)  * Avelox  Past History:  Past Medical History: Last updated: 03/09/2009 ASTHMATIC BRONCHITIS, ACUTE (ICD-466.0) ALLERGIC RHINITIS (ICD-477.9) Hx of ATRIAL FIBRILLATION (ICD-427.31) S/P AORTIC VALVE REPLACEMENT ([id number])...................................................Marland KitchenTennant Hx of SLEEP APNEA, MILD (ICD-780.57) CHRONIC OBSTRUCTIVE ASTHMA UNSPECIFIED (ICD-493.20) OSTEOPOROSIS (ICD-733.00) FATIGUE, CHRONIC (ICD-780.79) PRURITIC DISORDER NOS (ICD-698.9)  Past Surgical History: Last updated: 05/30/2009 back surgery 1994 Tonsillectomy Appendectomy ganglionectomy vocal cord polyps 1975 and 1984 pneumonia 1980 atrial fibrillation pneumonia 10/2003 aortic valve replacement 02/2005 Pace maker June 2010  Family History: Last updated: 02/17/2008 father renal failure paternal aunt colon cancer mother value disease  CABG MATERNAL GRANDMOTHER CVA MATERNAL GRANDFATHER HEART DISEASE PATERNAL GRANDFATHER mi AUNT HTN  Social History: Last updated: 11/03/2007 Patient states former smoker- quit 1995 married  Risk Factors: Exercise: yes (10/23/2007)  Risk Factors: Smoking Status: quit (11/03/2007)  Review of Systems      See HPI       The patient complains of fever, dyspnea on exertion, and prolonged cough.  The patient denies anorexia, weight loss, weight gain, vision loss, decreased hearing, hoarseness, chest pain, syncope, peripheral edema, headaches, hemoptysis, and severe indigestion/heartburn.    Vital Signs:  Patient profile:   77 year old female Height:      62 inches Weight:      141.25 pounds BMI:     25.93 O2 Sat:      96 % on 3 L/minpulsed Pulse rate:   60 / minute BP sitting:   114 / 60  (left arm) Cuff size:   regular  Vitals Entered By: Gweneth Dimitri RN (November 10, 2009 4:04 PM)  O2 Flow:  3 L/minpulsed CC: Acute Visit.   having sore throat, cough with green and yellow mucus, wheezing, chest tightness, and chest congestion since Sunday. c/o increased SOB with activity since Tuesday.  denies fever. Comments Medications reviewed with patient Gweneth Dimitri RN  November 10, 2009 4:04 PM    Physical Exam  Additional Exam:  General: A/Ox3; pleasant and cooperative, NAD, supplemental oxygen, , talkative SKIN: ndry scaling thickened skin on elbows. NODES: no lymphadenopathy HEENT: [place]/AT, EOM- WNL, Conjuctivae- clear, PERRLA, TM-WNL, Nose- mucosal erosion, Throat- clear and wnl, Melampatti II, oral mucosa ok NECK: Supple w/ fair ROM, JVD- none, normal carotid impulses w/o bruits Thyroid- CHEST: Congested cough.  HEART: RRR, 2/6 systolic precordial murmur ABDOMEN:  DUK:GURK, nl pulses, no edema  NEURO: Grossly intact to observation      Impression & Recommendations:  Problem # 1:  CHRONIC OBSTRUCTIVE ASTHMA UNSPECIFIED (ICD-493.20)  Acute exacerbation. This began as a viral bronchitis and rhinitis. We will let her finish her amoxacillin, but give doxycycline as a backup. We will give neb  Orders: Est. Patient Level III (27062)  Medications Added to Medication List This Visit: 1)  Xopenex 1.25 Mg/56ml Nebu (Levalbuterol hcl) .... Three times a day 2)  Doxycycline Hyclate 100 Mg Tabs (Doxycycline hyclate) .... 2 today then one d  Other Orders: Depo- Medrol 80mg  (J1040) Admin of Therapeutic Inj  intramuscular or subcutaneous (69629) Nebulizer Tx (52841)  Patient Instructions: 1)  Keep scheduled appointmnet 2)  Finish amoxacillin. 3)  if you are still sick after that, take the doxycycline. Watch out for easy bleeding with the coumadin. 4)  neb xop 1.25 5)  depo 80 Prescriptions: DOXYCYCLINE HYCLATE 100 MG TABS (DOXYCYCLINE HYCLATE) 2 today then one d  #8 x 0   Entered and Authorized by:   Waymon Budge MD   Signed by:   Waymon Budge MD on 11/10/2009   Method used:   Electronically to         [id number] (retail)       [address]       [place], [place]  [address]       Ph: [phone] or [phone]       Fax: [phone]   RxID:   [id number]    Medication Administration  Injection # 1:    Medication: Depo- Medrol 80mg     Diagnosis: ASTHMATIC BRONCHITIS, ACUTE (ICD-466.0)    Route: IM    Site: LUOQ gluteus    Exp Date: 08/2010    Lot #: [id number]    Mfr: TevaParenteralMedicine    Patient tolerated injection without complications    Given by: Gweneth Dimitri RN (November 10, 2009 4:52 PM)  Orders Added: 1)  Est. Patient Level III [73220] 2)  Depo- Medrol 80mg  [J1040] 3)  Admin of Therapeutic Inj  intramuscular or subcutaneous [96372] 4)  Nebulizer Tx [25427]

## 2010-12-07 NOTE — Miscellaneous (Signed)
Summary: Injection Record / [facility]    Injection Record / [facility]    Imported By: Lennie Odor 07/07/2010 12:07:34  _____________________________________________________________________  External Attachment:    Type:   Image     Comment:   External Document

## 2010-12-07 NOTE — Assessment & Plan Note (Signed)
Summary: rov/ok per katie/apc   Primary Provider/Referring Provider:  hopper  CC:  follow up visit-still having SOB.Marland Kitchen  History of Present Illness:  November 10, 2009- COPD, Chronic respiratory failure, OSA, AF/ pacemaker, CHF Sore throat x 2 days, sinus congestion and drainage, chest tight, wheeze, more short of breath, cough productive green. Husband is also sick. She started amoxacillin 2 days ago. Got flu  vax.  January 24, 2010- COPD, Chronic respiratory failure, OSA, AF/ pacemaker, CHF She got much better after last here. The depo shot may have helped. Starting about 2 weeks after that, she has noted gradual return of dyspnea with any exertion- arms or legs. OK sitting still. Saw Dr Patty Sermons who thought it might be fluid. He increased furosemide for 2 days and now back to 1/2 x 40 mg two times a day. Also her heart had jumped out of rhythm and has gone back. Coughing scant yellow. She has 5 mg prednisone at home and started 7 days ago taking 3 daily. Asked about tapering. Asks about travel to [place]/ altitude.  February 09, 2010- COPD, Chronic respiratory failure, OSA, AF/pacemaker, CHF Since last here has had continued gradual worsening dyspnea with minimal activity despite O2 at 3 L/m. She denies fever, sore throat, chest pain, palpitation or fluid retention. She wasn't sure what to do about prednisone, so she hadn't been taking it for past week. She is aware of pollen related watery rhinorhea. supine wheeze. Has coughed some thick white/ yellow.  March 02, 2010- COPD, Chronic respiratory failure, OSA, AF/pacemaker, CHF She says neb and depo last time helped for a few days, then exertional dyspnea returned. She is comfortable at rest, but desaturates quickly on 3 L pulse regulator with any walking. Her husband is on oxygen for pulmonary fibrosis. She is looking at assisted living options.   Current Medications (verified): 1)  Symbicort 160-4.5 Mcg/act  Aero (Budesonide-Formoterol  Fumarate) .... Inhale 2 Puffs Two Times A Day 2)  Spiriva Handihaler 18 Mcg  Caps (Tiotropium Bromide Monohydrate) .... Inhale Contents of 1 Capsule Once A Day 3)  Xopenex Hfa 45 Mcg/act  Aero (Levalbuterol Tartrate) .... 2 Puffs Four Times A Day As Needed 4)  Oxygen 2.5 - 3 Liters .... 24/7 5)  Xopenex 1.25 Mg/9ml  Nebu (Levalbuterol Hcl) .... Three Times A Day 6)  Allergy Vaccine 1:10 Gh .... Starting Build Up 7)  Childrens Allergy 12.5 Mg/4ml Liqd (Diphenhydramine Hcl) .... As Directed On Bottle 8)  Furosemide 40 Mg  Tabs (Furosemide) .... 1/2 Two Times A Day 9)  Lanoxin 0.125 Mg  Tabs (Digoxin) .... Take 1 Tablet By Mouth Once A Day 10)  Cardizem La 240 Mg  Tb24 (Diltiazem Hcl Coated Beads) .Marland Kitchen.. 1 By Mouth Qd 11)  Klor-Con 20 Meq  Pack (Potassium Chloride) .... Take 1 Tablet By Mouth Two Times A Day 12)  Zetia 10 Mg  Tabs (Ezetimibe) .Marland Kitchen.. 1 By Mouth Qd 13)  Warfarin Sodium 1 Mg  Tabs (Warfarin Sodium) .... As Directed 14)  Zoloft 50 Mg  Tabs (Sertraline Hcl) .... Take 1 Tablet By Mouth Once A Day 15)  Mucinex Maximum Strength 1200 Mg Xr12h-Tab (Guaifenesin) .... Take 1 Tablet By Mouth Two Times A Day 16)  Vitamin C 500 Mg  Tabs (Ascorbic Acid) .... Take 1 Tablet By Mouth Once A Day 17)  Stool Softener .... Take 1 Tablet By Mouth Once A Day 18)  Cvs Saline Nasal Spray 0.65 %  Soln (Saline) .... As Needed 19)  Coq10 100 Mg  Caps (Coenzyme Q10) .... Take 1 Tablet By Mouth Once A Day 20)  Multivitamins   Tabs (Multiple Vitamin) .... Take 1 Tablet By Mouth Once A Day 21)  Benzonatate 100 Mg Caps (Benzonatate) .Marland Kitchen.. 1 or 2 Four Times A Day As Needed 22)  Prilosec 20 Mg Cpdr (Omeprazole) .... Take 2 Tablet By Mouth Two Times A Day 23)  Calcium Citrate +  Tabs (Multiple Minerals-Vitamins) .Marland Kitchen.. 1 Once Daily 24)  Aerochamber Z-Stat Plus  Misc (Spacer/aero-Holding Chambers) .... Use With Inhaler As Directed 25)  Prednisone 5 Mg Tabs (Prednisone) .Marland Kitchen.. 1-4 Daily When Needed For Breathing Up To 14  Days. 26)  Sotalol Hcl 80 Mg Tabs (Sotalol Hcl) .... 2 Once Daily 27)  Vitamin B-12 100 Mcg Tabs (Cyanocobalamin) .... Take 1 By Mouth Once Daily  Allergies (verified): 1)  ! Sulfa 2)  Codeine 3)  * Mycins 4)  Cephalexin 5)  * Estrace 6)  * Analopram 7)  * Promethazine With Codeine 8)  * Chemdal 9)  * Hydrocortisone 10)  * Avelox  Past History:  Past Medical History: Last updated: 03/09/2009 ASTHMATIC BRONCHITIS, ACUTE (ICD-466.0) ALLERGIC RHINITIS (ICD-477.9) Hx of ATRIAL FIBRILLATION (ICD-427.31) S/P AORTIC VALVE REPLACEMENT ([id number])...................................................Marland KitchenTennant Hx of SLEEP APNEA, MILD (ICD-780.57) CHRONIC OBSTRUCTIVE ASTHMA UNSPECIFIED (ICD-493.20) OSTEOPOROSIS (ICD-733.00) FATIGUE, CHRONIC (ICD-780.79) PRURITIC DISORDER NOS (ICD-698.9)  Past Surgical History: Last updated: 05/30/2009 back surgery 1994 Tonsillectomy Appendectomy ganglionectomy vocal cord polyps 1975 and 1984 pneumonia 1980 atrial fibrillation pneumonia 10/2003 aortic valve replacement 02/2005 Pace maker June 2010  Family History: Last updated: 02/17/2008 father renal failure paternal aunt colon cancer mother value disease  CABG MATERNAL GRANDMOTHER CVA MATERNAL GRANDFATHER HEART DISEASE PATERNAL GRANDFATHER mi AUNT HTN  Social History: Last updated: 11/03/2007 Patient states former smoker- quit 1995 married  Risk Factors: Exercise: yes (10/23/2007)  Risk Factors: Smoking Status: quit (11/03/2007)  Review of Systems      See HPI       The patient complains of dyspnea on exertion, peripheral edema, and prolonged cough.  The patient denies anorexia, fever, weight loss, weight gain, vision loss, decreased hearing, hoarseness, chest pain, syncope, headaches, hemoptysis, abdominal pain, and severe indigestion/heartburn.    Vital Signs:  Patient profile:   77 year old female Height:      62.5 inches Weight:      138 pounds BMI:     24.93 O2 Sat:       96 % on 3 L/min Pulse rate:   60 / minute BP sitting:   136 / 82  (left arm) Cuff size:   regular  Vitals Entered By: Reynaldo Minium CMA (March 02, 2010 11:51 AM)  O2 Flow:  3 L/min  Physical Exam  Additional Exam:  General: A/Ox3; pleasant and cooperative, NAD, supplemental oxygen- saturation 96% aat rest on 3L SKIN: ndry scaling thickened skin on elbows. NODES: no lymphadenopathy HEENT: [place]/AT, EOM- WNL, Conjuctivae- clear, PERRLA, TM-WNL, Nose- mucosal erosion, Throat- clear and wnl, Mallampatii II, oral mucosa ok NECK: Supple w/ fair ROM, JVD- none, normal carotid impulses w/o bruits Thyroid- CHEST: Less cough, distant without rales or wheeze. Cough is raspy. HEART: RRR, 2/6 systolic precordial murmur ABDOMEN:  VWU:JWJX, nl pulses, no edema  NEURO: Grossly intact to observation      Impression & Recommendations:  Problem # 1:  CHRONIC OBSTRUCTIVE ASTHMA UNSPECIFIED (ICD-493.20) She is frail and she doesn't think she is better, but I think she looks stronger today. We will see how she does with  a stronger coritsone inhaled dose by using Dulera sample. She had gotten some temporary improvement lat year when pacemaker was placed, but she struggles now on a chronic basis.  Other Orders: Est. Patient Level II (04540)  Patient Instructions: 1)  Please schedule a follow-up appointment in 3 months. 2)  Try sample Dulera 250/5: 2 puffs and rinse mouth twice daily 3)  When it is used up, go back to your Symbicort for comparison.

## 2010-12-07 NOTE — Letter (Signed)
Summary: Statement of Medical Necessity / [facility]  Statement of Medical Necessity / [facility]   Imported By: Lennie Odor 10/23/2010 16:08:27  _____________________________________________________________________  External Attachment:    Type:   Image     Comment:   External Document

## 2010-12-07 NOTE — Progress Notes (Signed)
Summary: refill request  Phone Note Other Incoming Call back at pt visited allergy lab   Caller: patient was in allergy lab Summary of Call: patient would like a prescription for Singulair 10 mg, she stated you gave her samples to try and was told to let you know if they worked, they worked and she would like a Equities trader. Initial call taken by: Denna Haggard, CMA,  April 26, 2010 12:40 PM  Follow-up for Phone Call        Singulair script added to med list. please send. Follow-up by: Denna Haggard, CMA,  April 26, 2010 12:35 PM  Additional Follow-up for Phone Call Additional follow up Details #1::        rx sent to [facility] [address]  pt is aware. Boone Master CNA/MA  April 26, 2010 2:49 PM     New/Updated Medications: SINGULAIR 10 MG TABS (MONTELUKAST SODIUM) 1 daily for asthma Prescriptions: SINGULAIR 10 MG TABS (MONTELUKAST SODIUM) 1 daily for asthma  #30 x 6   Entered by:   Boone Master CNA/MA   Authorized by:   Waymon Budge MD   Signed by:   Boone Master CNA/MA on 04/26/2010   Method used:   Electronically to        [facility] [id number] (retail)       [address]       [place], [place]  [address]       Ph: [phone] or [phone]       Fax: [phone]   RxID:   [id number] SINGULAIR 10 MG TABS (MONTELUKAST SODIUM) 1 daily for asthma  #90 x 3   Entered and Authorized by:   Waymon Budge MD   Signed by:   Waymon Budge MD on 04/26/2010   Method used:   Historical   RxID:   [id number]

## 2010-12-07 NOTE — Procedures (Signed)
Summary: EGD/[facility]  EGD/[facility]   Imported By: Sherian Rein 07/18/2010 07:20:01  _____________________________________________________________________  External Attachment:    Type:   Image     Comment:   External Document

## 2010-12-07 NOTE — Progress Notes (Signed)
Summary: sore throat  Phone Note Call from Patient   Caller: Patient Call For: young Summary of Call: pt would like to be worked in today with dr young she have copd and sore throat Initial call taken by: Rickard Patience,  November 09, 2009 12:04 PM  Follow-up for Phone Call        Pt c/o productive cough with green phlegm, sore throat and congeston. Pt started on Amoxycillin yesterday, but request an appt with CY. Pt acheduled to see CY 11/10/09 at 3:15. Carron Curie CMA  November 09, 2009 12:44 PM

## 2010-12-07 NOTE — Assessment & Plan Note (Signed)
Summary: rov 4 months///kp   Copy to:  n.a Primary Provider/Referring Provider:  [name], MD  CC:  4 MONTH FOLLOW UP VISIT-allergies..  History of Present Illness: July 20, 2010-  Can't walk any without continuous flow oxygen at 3 L. If she sits, sats quickly go up to 98% on 2-3 L.. Gets a nose bleed about once a month.  Continues allergy vaccine at 1:10. Humidity bothered her. She continues Dulera 200 without a spacer and she think it helps better than Symbicort. Today her lungs feel clear. CXR- 04/10/10- CE s/pAVR/ pacemaker, scarring and COPD changes CBC- Hgb 1.6, platelets 177K.  September 21, 2010- Asthma, rhinosinusitis, allergic rhinitis, hemoptysis, hx AVR. AF/ coumadin Reports hemoptysis agan, on coumadin.  Nurse-CC: c/o cough x wk.-hemoptysis approx. 1 tsp.,sob increased,wheezing,no fcs CXR- LLL infilt new Husband in icu after fall, injuring hip. She is stressed and tearful about this. She is too weak and dyspneic to walk distance through [facility] to visit him.  Started hemoptysis Nov 10 x 5 days, now stopped. Denies fever or purulent, but stays yellow. She doesn't feel she needs antibiotic, but asks for prednisone for dyspnea. Feels tight across upper abdomen w/ no pain. Denies anginal pain, palpitation, blood, change in bowel or bladder.   November 20, 2010-  Asthma, rhinosinusitis, allergic rhinitis, hemoptysis, hx AVR. AF/ coumadin Reports hemoptysis agan, on coumadin Nurse-CC: 4 MONTH FOLLOW UP VISIT-allergies. CXR 09/26/10- pneumonia, CE w/o CHF Continues allergy vaccine.  Grief after husband's death.  For the past 4-6 weeks she has been concerned about her hoarseness. Often globus, but not constant. Wakes with tongue coated- brushes it. Children's benadry helps. Always some yellow mucus, either nose or chest. Cold air causes increased chest congestion. Has few days self-limited hemoptysis and epistaxis. Used some of her husband's left over prednisone last  month- helped. Asks to hold scripts for cough syrup, prednisone and magic mouthwash.       Preventive Screening-Counseling & Management  Alcohol-Tobacco     Smoking Status: quit     Year Quit: 1995     Pack years: 50 years 1 pack daily     Tobacco Counseling: to remain off tobacco products   Current Medications (verified): 1)  Dulera 200-5 Mcg/act Aero (Mometasone Furo-Formoterol Fum) .... 2 Puffs Two Times A Day As Needed 2)  Spiriva Handihaler 18 Mcg  Caps (Tiotropium Bromide Monohydrate) .... Inhale Contents of 1 Capsule Once A Day 3)  Xopenex Hfa 45 Mcg/act  Aero (Levalbuterol Tartrate) .... 2 Puffs Four Times A Day As Needed 4)  Oxygen 2.5 - 3 Liters .... 24/7 5)  Xopenex 1.25 Mg/26ml  Nebu (Levalbuterol Hcl) .... Three Times A Day 6)  Allergy Vaccine 1:10 Gh .... Starting Build Up 7)  Childrens Allergy 12.5 Mg/70ml Liqd (Diphenhydramine Hcl) .... As Directed On Bottle 8)  Furosemide 40 Mg  Tabs (Furosemide) .... 1/2 Two Times A Day 9)  Lanoxin 0.125 Mg  Tabs (Digoxin) .... Take 1 Tablet By Mouth Once A Day 10)  Cardizem La 240 Mg  Tb24 (Diltiazem Hcl Coated Beads) .Marland Kitchen.. 1 By Mouth Once Daily **appointment Due For Additional Refills** 11)  Klor-Con 20 Meq  Pack (Potassium Chloride) .... Take 1 Tablet By Mouth Two Times A Day 12)  Zetia 10 Mg  Tabs (Ezetimibe) .Marland Kitchen.. 1 By Mouth Qd 13)  Warfarin Sodium 1 Mg  Tabs (Warfarin Sodium) .... As Directed 14)  Zoloft 50 Mg  Tabs (Sertraline Hcl) .... Take 1 Tablet By Mouth Once  A Day 15)  Mucinex Maximum Strength 1200 Mg Xr12h-Tab (Guaifenesin) .... Take 1 Tablet By Mouth Two Times A Day 16)  Vitamin C 500 Mg  Tabs (Ascorbic Acid) .... Take 1 Tablet By Mouth Once A Day 17)  Stool Softener .... Take 1 Tablet By Mouth Once A Day 18)  Cvs Saline Nasal Spray 0.65 %  Soln (Saline) .... As Needed 19)  Coq10 100 Mg  Caps (Coenzyme Q10) .... Take 1 Tablet By Mouth Once A Day 20)  Multivitamins   Tabs (Multiple Vitamin) .... Take 1 Tablet By Mouth  Once A Day 21)  Nexium 40 Mg Cpdr (Esomeprazole Magnesium) .... Take 1 Tablet By Mouth Once A Day 22)  Calcium Citrate +  Tabs (Multiple Minerals-Vitamins) .Marland Kitchen.. 1 Once Daily 23)  Aerochamber Z-Stat Plus  Misc (Spacer/aero-Holding Chambers) .... Use With Inhaler As Directed 24)  Sotalol Hcl 80 Mg Tabs (Sotalol Hcl) .... 2 Once Daily 25)  Vitamin B-12 100 Mcg Tabs (Cyanocobalamin) .... Take 1 By Mouth Once Daily 26)  Singulair 10 Mg Tabs (Montelukast Sodium) .Marland Kitchen.. 1 Daily For Asthma 27)  Crestor 5 Mg Tabs (Rosuvastatin Calcium) .... Take 1 Tablet Two Times A Week  Allergies (verified): 1)  ! Sulfa 2)  Codeine 3)  * Mycins 4)  Cephalexin 5)  * Estrace 6)  * Analopram 7)  * Promethazine With Codeine 8)  * Chemdal 9)  * Hydrocortisone 10)  * Avelox  Past History:  Past Medical History: Last updated: 07/14/2010 ASTHMATIC BRONCHITIS, ACUTE (ICD-466.0) ALLERGIC RHINITIS (ICD-477.9) Hx of ATRIAL FIBRILLATION (ICD-427.31) S/P AORTIC VALVE REPLACEMENT ([id number])...................................................Marland KitchenTennant Hx of SLEEP APNEA, MILD (ICD-780.57) CHRONIC OBSTRUCTIVE ASTHMA UNSPECIFIED (ICD-493.20) OSTEOPOROSIS (ICD-733.00) FATIGUE, CHRONIC (ICD-780.79) PRURITIC DISORDER NOS (ICD-698.9) Diverticulosis Hemorrhoids GERD  Past Surgical History: Last updated: 07/17/2010 back surgery 1994 Tonsillectomy Appendectomy ganglionectomy vocal cord polyps 1975 and 1984 pneumonia 1980 atrial fibrillation pneumonia 10/2003 aortic valve replacement 02/2005 Pace maker June 2010 Cardiac Ablation  Family History: Last updated: 07/17/2010 father renal failure paternal aunt colon cancer mother value disease  CABG MATERNAL GRANDMOTHER CVA MATERNAL GRANDFATHER HEART DISEASE PATERNAL GRANDFATHER mi AUNT HTN Family History of Breast Cancer: Daughter  Social History: Last updated: 11/20/2010 Patient states former smoker- quit 1995 Widowed  Alcohol Use - no Illicit Drug Use -  no  Risk Factors: Exercise: yes (10/23/2007)  Risk Factors: Smoking Status: quit (11/20/2010)  Social History: Patient states former smoker- quit 1995 Widowed  Alcohol Use - no Illicit Drug Use - no  Review of Systems      See HPI       The patient complains of shortness of breath with activity and nasal congestion/difficulty breathing through nose.  The patient denies shortness of breath at rest, productive cough, non-productive cough, coughing up blood, chest pain, irregular heartbeats, acid heartburn, indigestion, loss of appetite, weight change, abdominal pain, difficulty swallowing, sore throat, tooth/dental problems, headaches, sneezing, itching, ear ache, rash, change in color of mucus, and fever.         Sore at times through epigstrium to back- not food related.   Vital Signs:  Patient profile:   77 year old female Height:      62.5 inches Weight:      132 pounds BMI:     23.84 O2 Sat:      98 % on 3 L/min Pulse rate:   62 / minute BP sitting:   122 / 70  (left arm) Cuff size:   regular  Vitals Entered By: Reynaldo Minium  CMA (November 20, 2010 11:04 AM)  O2 Flow:  3 L/min CC: 4 MONTH FOLLOW UP VISIT-allergies.   Physical Exam  Additional Exam:  General: A/Ox3; pleasant and cooperative, NAD, supplemental oxygen- saturation 98% at rest on 3L demand regulator SKIN: ndry scaling thickened skin on elbows. NODES: no lymphadenopathy HEENT: [place]/AT, EOM- WNL, Conjuctivae- clear, PERRLA, TM-WNL, Nose- mucosal erosion, Throat- clear and wnl, Mallampatii II, oral mucosa ok NECK: Supple w/ fair ROM, JVD- none, normal carotid impulses w/o bruits Thyroid- CHEST: diminished , no dullness, unlabored, quiet/ distant HEART: RRR, 2/6 systolic precordial murmur ABDOMEN: soft NWG:NFAO, nl pulses, no edema  NEURO: Grossly intact to observation,no nystagmus      Impression & Recommendations:  Problem # 1:  HEMOPTYSIS UNSPECIFIED (ICD-786.30)  Unclear if she had epistaxis or  hemoptysis, on warfarin, but it has spontaneously resolved. She has had blood per mouth before without specific source. At her age and frailty, we won't put her through much and if it will resolve spontaneously, that is best.  The following medications were removed from the medication list:    Prednisone 10 Mg Tabs (Prednisone) .Marland Kitchen... 1 daily x 7 days Her updated medication list for this problem includes:    Dulera 200-5 Mcg/act Aero (Mometasone furo-formoterol fum) .Marland Kitchen... 2 puffs two times a day as needed    Spiriva Handihaler 18 Mcg Caps (Tiotropium bromide monohydrate) ..... Inhale contents of 1 capsule once a day    Xopenex Hfa 45 Mcg/act Aero (Levalbuterol tartrate) .Marland Kitchen... 2 puffs four times a day as needed    Xopenex 1.25 Mg/72ml Nebu (Levalbuterol hcl) .Marland Kitchen... Three times a day    Vitamin C 500 Mg Tabs (Ascorbic acid) .Marland Kitchen... Take 1 tablet by mouth once a day    Multivitamins Tabs (Multiple vitamin) .Marland Kitchen... Take 1 tablet by mouth once a day    Singulair 10 Mg Tabs (Montelukast sodium) .Marland Kitchen... 1 daily for asthma    Prednisone 10 Mg Tabs (Prednisone) .Marland Kitchen... 1-2 daily as needed. use sparingly.  Orders: Est. Patient Level III (13086)  Problem # 2:  CHRONIC OBSTRUCTIVE ASTHMA UNSPECIFIED (ICD-493.20) Severe COPD with chronic hypoxic respiratory failure. Stress and grief now will take time. She is coping fairly well. We did discuss provision of meds  including a standby script for prednisone. Xopenex neb solution is needed because of demonstrated intolerance with acceleration of her atrial fib.   Problem # 3:  ALLERGIC RHINITIS (ICD-477.9) She wants to continue her allergy vaccine for now. I will revisit this issue as we see how the spring goes.  Her updated medication list for this problem includes:    Childrens Allergy 12.5 Mg/45ml Liqd (Diphenhydramine hcl) .Marland Kitchen... As directed on bottle    Cvs Saline Nasal Spray 0.65 % Soln (Saline) .Marland Kitchen... As needed  Medications Added to Medication List This Visit: 1)   [facility] Magic Mouthwash  .... 1 teaspoon swish and swallow four times a day 2)  Prednisone 10 Mg Tabs (Prednisone) .Marland Kitchen.. 1-2 daily as needed. use sparingly. 3)  Tussionex Pennkinetic Er 10-8 Mg/6ml Lqcr (Hydrocod polst-chlorphen polst) .Marland Kitchen.. 1 teaspoon two times a day as needed cough.  Patient Instructions: 1)  Please schedule a follow-up appointment in 3 months. 2)  Refill scripts for cough syrup, prednisone and magic mouthwash. Prescriptions: TUSSIONEX PENNKINETIC ER 10-8 MG/5ML LQCR (HYDROCOD POLST-CHLORPHEN POLST) 1 teaspoon two times a day as needed cough.  #200 ml x 0   Entered and Authorized by:   Waymon Budge MD   Signed by:  Waymon Budge MD on 11/20/2010   Method used:   Print then Give to Patient   RxID:   [id number] PREDNISONE 10 MG TABS (PREDNISONE) 1-2 daily as needed. Use sparingly.  #50 x 1   Entered and Authorized by:   Waymon Budge MD   Signed by:   Waymon Budge MD on 11/20/2010   Method used:   Print then Give to Patient   RxID:   [id number] [facility] MAGIC MOUTHWASH 1 teaspoon swish and swallow four times a day  #150 ml x 1   Entered and Authorized by:   Waymon Budge MD   Signed by:   Waymon Budge MD on 11/20/2010   Method used:   Print then Give to Patient   RxID:   [id number]

## 2010-12-07 NOTE — Assessment & Plan Note (Signed)
Summary: rov 4 months///kp   Primary Provider/Referring Provider:  hopper  CC:  4 month follow up.  states for the past 3 wks breathing has been doing worse.  c/o increased SOB with any kind of activity and wheezing and chest tightness that's worse qhs.  started prednisone 15mg  x7days ago-this has helped a little.  [name]  History of Present Illness: 9/23/210- COPD, Chronic resp failure, OSA, AF/ pacemaker, CHF/ diastolic/ AS rhinitis Continues oxygen. Antibiotic last visit lightened sputum for awhile, left some vaginal itching. Wheezing at night and feels she is drifting  back towards hospital trouble.Sleeps soundly but waking a little more at night.  Denies fever, sweat, blood, anginal pain, palpitation, cough, tussive chest pain. heart burn, reflux, nausea or diarrhea.  September 26, 2009- COPD, Chronic respiratory failure, OSA, AF/pacemaker, CHF/ diastolic/AS, rhinitis She never feels able to get from one room to another unless she takes prednisone 10 mg daily. Gets wheezey and short of breath with weather changes. Has not needed antibiotic.  November 10, 2009- COPD, Chronic respiratory failure, OSA, AF/ pacemaker, CHF Sore throat x 2 days, sinus congestion and drainage, chest tight, wheeze, more short of breath, cough productive green. Husband is also sick. She started amoxacillin 2 days ago. Got flu  vax.  January 24, 2010- COPD, Chronic respiratory failure, OSA, AF/ pacemaker, CHF She got much better after last here. The depo shot may have helped. Starting about 2 weeks after that, she has noted gradual return of dyspnea with any exertion- arms or legs. OK sitting still. Saw Dr Patty Sermons who thought it might be fluid. He increased furosemide for 2 days and now back to 1/2 x 40 mg two times a day. Also her heart had jumped out of rhythm and has gone back. Coughing scant yellow. She has 5 mg prednisone at home and started 7 days ago taking 3 daily. Asked about tapering. Asks about travel to  [place]/ altitude.     Current Medications (verified): 1)  Symbicort 160-4.5 Mcg/act  Aero (Budesonide-Formoterol Fumarate) .... Inhale 2 Puffs Two Times A Day 2)  Spiriva Handihaler 18 Mcg  Caps (Tiotropium Bromide Monohydrate) .... Inhale Contents of 1 Capsule Once A Day 3)  Xopenex Hfa 45 Mcg/act  Aero (Levalbuterol Tartrate) .... 2 Puffs Four Times A Day As Needed 4)  Oxygen 2.5 - 3 Liters .... 24/7 5)  Singulair 10 Mg  Tabs (Montelukast Sodium) .[name].. 1 By Mouth Once Daily, Additional Refills Require An Appointment 6)  Xopenex 1.25 Mg/7ml  Nebu (Levalbuterol Hcl) .... Three Times A Day 7)  Allergy Vaccine 1:10 Gh .... Starting Build Up 8)  Childrens Allergy 12.5 Mg/44ml Liqd (Diphenhydramine Hcl) .... As Directed On Bottle 9)  Furosemide 40 Mg  Tabs (Furosemide) .... 1/2 Two Times A Day 10)  Lanoxin 0.125 Mg  Tabs (Digoxin) .... Take 1 Tablet By Mouth Once A Day 11)  Cardizem La 240 Mg  Tb24 (Diltiazem Hcl Coated Beads) .[name].. 1 By Mouth Qd 12)  Klor-Con 20 Meq  Pack (Potassium Chloride) .... Take 1 Tablet By Mouth Two Times A Day 13)  Zetia 10 Mg  Tabs (Ezetimibe) .[name].. 1 By Mouth Qd 14)  Warfarin Sodium 1 Mg  Tabs (Warfarin Sodium) .... As Directed 15)  Zoloft 50 Mg  Tabs (Sertraline Hcl) .... Take 1 Tablet By Mouth Once A Day 16)  Mucinex Maximum Strength 1200 Mg Xr12h-Tab (Guaifenesin) .... Take 1 Tablet By Mouth Two Times A Day 17)  Vitamin C 500 Mg  Tabs (  Ascorbic Acid) .... Take 1 Tablet By Mouth Once A Day 18)  Stool Softener .... Take 1 Tablet By Mouth Once A Day 19)  [facility]vs Saline Nasal Spray 0.65 %  Soln (Saline) .... As Needed 20)  Coq10 100 Mg  Caps (Coenzyme Q10) .... Take 1 Tablet By Mouth Once A Day 21)  Multivitamins   Tabs (Multiple Vitamin) .... Take 1 Tablet By Mouth Once A Day 22)  Benzonatate 100 Mg Caps (Benzonatate) .[name].. 1 or 2 Four Times A Day As Needed 23)  Prilosec 20 Mg Cpdr (Omeprazole) .... Take 2 Tablet By Mouth Two Times A Day 24)  Calcium Citrate +  Tabs  (Multiple Minerals-Vitamins) .[name].. 1 Once Daily 25)  Aerochamber Z-Stat Plus  Misc (Spacer/aero-Holding Chambers) .... Use With Inhaler As Directed 26)  Prednisone 10 Mg Tabs (Prednisone) .[name].. 1-3 Daily When Needed For Breathing 27)  Sotalol Hcl 80 Mg Tabs (Sotalol Hcl) .... 2 Once Daily  Allergies (verified): 1)  ! Sulfa 2)  Codeine 3)  * Mycins 4)  Cephalexin 5)  * Estrace 6)  * Analopram 7)  * Promethazine With Codeine 8)  * Chemdal 9)  * Hydrocortisone 10)  * Avelox  Past History:  Past Medical History: Last updated: 03/09/2009 ASTHMATIC BRONCHITIS, ACUTE (ICD-466.0) ALLERGIC RHINITIS (ICD-477.9) Hx of ATRIAL FIBRILLATION (ICD-427.31) S/P AORTIC VALVE REPLACEMENT ([id number])...................................................And[facility]ant Hx of SLEEP APNEA, MILD (ICD-780.57) CHRONIC OBSTRUCTIVE ASTHMA UNSPECIFIED (ICD-493.20) OSTEOPOROSIS (ICD-733.00) FATIGUE, CHRONIC (ICD-780.79) PRURITIC DISORDER NOS (ICD-698.9)  Past Surgical History: Last updated: 05/30/2009 back surgery 1994 Tonsillectomy Appendectomy ganglionectomy vocal cord polyps 1975 and 1984 pneumonia 1980 atrial fibrillation pneumonia 10/2003 aortic valve replacement 02/2005 Pace maker June 2010  Family History: Last updated: 02/17/2008 father renal failure paternal aunt colon cancer mother value disease  CABG MATERNAL GRANDMOTHER CVA MATERNAL GRANDFATHER HEART DISEASE PATERNAL GRANDFATHER mi AUNT HTN  Social History: Last updated: 11/03/2007 Patient states former smoker- quit 1995 married  Risk Factors: Exercise: yes (10/23/2007)  Risk Factors: Smoking Status: quit (11/03/2007)  Review of Systems      See HPI       The patient complains of dyspnea on exertion.  The patient denies anorexia, fever, weight loss, weight gain, vision loss, decreased hearing, hoarseness, chest pain, syncope, peripheral edema, prolonged cough, headaches, hemoptysis, and severe indigestion/heartburn.    Vital  Signs:  Patient profile:   77 year old female Height:      62.5 inches Weight:      141.25 pounds O2 Sat:      93 % on 2.5 L/minpulsed Pulse rate:   60 / minute BP sitting:   130 / 62  (right arm) Cuff size:   regular  Vitals Entered By: Gweneth Dimitri RN (January 24, 2010 10:33 AM)  O2 Flow:  2.5 L/minpulsed CC: 4 month follow up.  states for the past 3 wks breathing has been doing worse.  c/o increased SOB with any kind of activity, wheezing and chest tightness that's worse qhs.  started prednisone 15mg  x7days ago-this has helped a little.   Comments Medications reviewed with patient Daytime contact number verified with patient. Gweneth Dimitri RN  January 24, 2010 10:37 AM    Physical Exam  Additional Exam:  General: A/Ox3; pleasant and cooperative, NAD, supplemental oxygen- saturation 93%, , talkative SKIN: ndry scaling thickened skin on elbows. NODES: no lymphadenopathy HEENT: [place]/AT, EOM- WNL, Conjuctivae- clear, PERRLA, TM-WNL, Nose- mucosal erosion, Throat- clear and wnl, Mallampatii II, oral mucosa ok NECK: Supple w/ fair ROM, JVD- none, normal  carotid impulses w/o bruits Thyroid- CHEST: Congested cough.  HEART: RRR, 2/6 systolic precordial murmur ABDOMEN:  EAV:WUJW, nl pulses, no edema  NEURO: Grossly intact to observation      Impression & Recommendations:  Problem # 1:  CHRONIC OBSTRUCTIVE ASTHMA UNSPECIFIED (ICD-493.20) Severe COPD. Exam today is unremarkable. She says she wheezes at night lying down but is very clear on exam today.We discussed meds. She also says she is more dyspneic, but with no physical correlation. We discused steroids- I don't want to have her too heavy handed with steroids .She wanted depo injection but has no wheeze so I asked her to wait.  Medications Added to Medication List This Visit: 1)  Childrens Allergy 12.5 Mg/60ml Liqd (Diphenhydramine hcl) .... As directed on bottle 2)  Zoloft 50 Mg Tabs (Sertraline hcl) .... Take 1 tablet by mouth  once a day 3)  Prednisone 5 Mg Tabs (prednisone)  .[name].. 1-4 daily when needed for breathing up to 14 days. 4)  Sotalol Hcl 80 Mg Tabs (Sotalol hcl) .... 2 once daily  Other Orders: Est. Patient Level III (11914)  Patient Instructions: 1)  Please schedule a follow-up appointment in 3 months. 2)  We will fill 3 month script requests fo Symbicort and Xopenex. Prescriptions: XOPENEX 1.25 MG/3ML  NEBU (LEVALBUTEROL HCL) three times a day  #270 x 3   Entered and Authorized by:   Waymon Budge MD   Signed by:   Waymon Budge MD on 01/31/2010   Method used:   Print then Give to Patient   RxID:   [id number] SYMBICORT 160-4.5 MCG/ACT  AERO (BUDESONIDE-FORMOTEROL FUMARATE) Inhale 2 puffs two times a day  #3 x 3   Entered and Authorized by:   Waymon Budge MD   Signed by:   Waymon Budge MD on 01/31/2010   Method used:   Print then Give to Patient   RxID:   [id number]

## 2010-12-07 NOTE — Progress Notes (Signed)
Summary: sample/rx of dulera; SOB, wheezing, prod cough  Phone Note Call from Patient   Caller: Patient Call For: young Summary of Call: pt is completely out of symbicort. says that she and dr young had discussed changing to [place]. pt wants to pick up a sample of dulera today if possible or call [facility] [address] cell [phone]  Initial call taken by: Tivis Ringer, CNA,  July 07, 2010 9:49 AM  Follow-up for Phone Call        called spoke with patient who states that per hr last ov (june) she liked the [place] but was going to use up her supply of symbicort.  pt is completely out of symbicort and would like an rx and sample of dulera.  [name], please verify what dose of the dulera you would like pt to have (the trial was for the 200-5).  also, pt c/o increased SOB, wheezing and prod cough with yellow mucus x5days.  denies f/c/s.  has upcoming ov 07-20-10.  [facility] [address]  pt will be by after 12pm to pick up sample(s).  ALLERGIES: codeine, sulfa, mycins, avelox, cephalexing, estrace, analopram, promethazine w/ codeine, chemdal, hydrocortisone  please advise, thanks! Follow-up by: Boone Master CNA/MA,  July 07, 2010 10:04 AM  Additional Follow-up for Phone Call Additional follow up Details #1::        May have sample Dulera 200-5 2 puffs and rinse mouth twice daily.  I have put script on med list. Let her know that if her insurance won't cover this we will use something else ( no prior auth). Additional Follow-up by: Waymon Budge MD,  July 07, 2010 10:09 AM    Additional Follow-up for Phone Call Additional follow up Details #2::    Spoke with pt and notified of recs per [name].  Pt verbalized understanding.  Will try sample first to make sure this works okay.  Sample left up front and rx was sent. Follow-up by: Vernie Murders,  July 07, 2010 10:50 AM  New/Updated Medications: DULERA 200-5 MCG/ACT AERO (MOMETASONE FURO-FORMOTEROL FUM) 2 puffs two  times a day as needed Prescriptions: DULERA 200-5 MCG/ACT AERO (MOMETASONE FURO-FORMOTEROL FUM) 2 puffs two times a day as needed  #1 x 3   Entered by:   Vernie Murders   Authorized by:   Waymon Budge MD   Signed by:   Vernie Murders on 07/07/2010   Method used:   Electronically to        [id number] (retail)       [address]       [place], [place]  [address]       Ph: [phone] or [phone]       Fax: [phone]   RxID:   [id number] 200-5 MCG/ACT AERO (MOMETASONE FURO-FORMOTEROL FUM) 2 puffs two times a day as needed  #3 x 3   Entered by:   Waymon Budge MD   Authorized by:   Pulmonary Triage   Signed by:   Waymon Budge MD on 07/07/2010   Method used:   Print then Give to Patient   RxID:   [id number]

## 2010-12-07 NOTE — Progress Notes (Signed)
Summary: fax req- dr Deborah Chalk request  Phone Note From Other Clinic   Caller: KELLI AT [facility] OFFICE Call For: YOUNG Summary of Call: needs labs re: INR- faxed asap (caller says they did not receive this). fax # [phone]. contact # [phone] Initial call taken by: Tivis Ringer, CNA,  April 26, 2010 2:43 PM  Follow-up for Phone Call        labs printed off and faxed to Dr. Ronnald Nian office as requested.  called spoke with Orpha Bur and informed her that the requested labs were being sent.  Orpha Bur stated she will relay the msg. Follow-up by: Boone Master CNA/MA,  April 26, 2010 2:54 PM

## 2010-12-07 NOTE — Miscellaneous (Signed)
Summary: Injection Record / [facility]    Injection Record / [facility]    Imported By: Lennie Odor 03/28/2010 15:25:43  _____________________________________________________________________  External Attachment:    Type:   Image     Comment:   External Document

## 2010-12-07 NOTE — Progress Notes (Signed)
Summary: Spouse passed/?additional meds/recommendations  Phone Note Call from Patient   Caller: Daughter [phone] Summary of Call: Patient daughter called stating that the patient spouse passed and she is not sure how the patient will handle the service. She would like to know if you think the patient will need anything with her at the time of the service if she does not handle it well (additional meds, etc). Please advise. Initial call taken by: Lucious Groves CMA,  October 03, 2010 10:24 AM  Follow-up for Phone Call        i'm sorry to hear this; she will be in my thoughts & prayers,see Lorazepam Rx Follow-up by: [name] MD,  October 03, 2010 1:13 PM  Additional Follow-up for Phone Call Additional follow up Details #1::        Patient daughter notified. Additional Follow-up by: Lucious Groves CMA,  October 03, 2010 3:19 PM    New/Updated Medications: LORAZEPAM 0.5 MG TABS (LORAZEPAM) 1 every 8-12 hrs as needed anxiety Prescriptions: LORAZEPAM 0.5 MG TABS (LORAZEPAM) 1 every 8-12 hrs as needed anxiety  #30 x 0   Entered and Authorized by:   [name] MD   Signed by:   [name] MD on 10/03/2010   Method used:   Printed then faxed to ...       [facility]* (retail)             ,          Ph: [phone]       Fax: [phone]   RxID:   [id number]

## 2010-12-07 NOTE — Progress Notes (Signed)
Summary: results  Phone Note Call from Patient   Caller: Patient Call For: young Summary of Call: need results of labs and xray Initial call taken by: Rickard Patience,  April 26, 2010 9:03 AM  Follow-up for Phone Call        labs unsigned will forward to dr young  Philipp Deputy CMA  April 26, 2010 9:35 AM   Additional Follow-up for Phone Call Additional follow up Details #1::        OK to report labs. i have sent copies of labs incl coags to Dr Deborah Chalk who manages her coumadin.   Has there been more hemoptysis? Additional Follow-up by: Waymon Budge MD,  April 26, 2010 11:20 AM    Additional Follow-up for Phone Call Additional follow up Details #2::    LMTCB Vernie Murders  April 26, 2010 11:31 AM   Patient's wife returning Leslie's call about husband's lab results. Follow-up by: Lehman Prom,  April 26, 2010 12:20 PM  Additional Follow-up for Phone Call Additional follow up Details #3:: Details for Additional Follow-up Action Taken: no more hemoptysis per pt, pt also aware of labs and cxr results, i told pt a copy was forwarded to dr Deborah Chalk Additional Follow-up by: Philipp Deputy CMA,  April 26, 2010 12:33 PM

## 2010-12-07 NOTE — Miscellaneous (Signed)
Summary: Injection [employer]   Imported By: Sherian Rein 09/27/2010 14:11:24  _____________________________________________________________________  External Attachment:    Type:   Image     Comment:   External Document

## 2010-12-07 NOTE — Progress Notes (Signed)
Summary: Triage   Phone Note Call from Patient Call back at Home Phone [phone]   Caller: Patient Call For: Dr. Marina Goodell Reason for Call: Talk to Nurse Summary of Call: pt. feels like she is impacted, changes in BM....requesting sooner appt. than next avail. Initial call taken by: Karna Christmas,  June 02, 2010 2:14 PM  Follow-up for Phone Call        Pt. re-assured that she needs to follow Dr.Perry's orders from this am  re: titrating Miralax for constpation. Follow-up by: Teryl Lucy RN,  June 02, 2010 2:45 PM

## 2010-12-13 ENCOUNTER — Encounter (INDEPENDENT_AMBULATORY_CARE_PROVIDER_SITE_OTHER): Payer: Self-pay | Admitting: *Deleted

## 2010-12-13 ENCOUNTER — Encounter: Payer: Self-pay | Admitting: Internal Medicine

## 2010-12-13 ENCOUNTER — Other Ambulatory Visit (INDEPENDENT_AMBULATORY_CARE_PROVIDER_SITE_OTHER): Payer: Medicare Other

## 2010-12-13 DIAGNOSIS — Z1211 Encounter for screening for malignant neoplasm of colon: Secondary | ICD-10-CM

## 2010-12-13 LAB — CONVERTED CEMR LAB: OCCULT 3: NEGATIVE

## 2010-12-13 NOTE — Letter (Signed)
Summary: CMN for Nebulizer/[facility]  CMN for Nebulizer/[facility]   Imported By: Sherian Rein 12/04/2010 11:17:18  _____________________________________________________________________  External Attachment:    Type:   Image     Comment:   External Document

## 2010-12-13 NOTE — Letter (Signed)
Summary: CMN for Nebulizer & Meds/[facility]  CMN for Nebulizer & Meds/[facility]   Imported By: Sherian Rein 12/04/2010 11:07:16  _____________________________________________________________________  External Attachment:    Type:   Image     Comment:   External Document

## 2010-12-13 NOTE — Assessment & Plan Note (Signed)
Summary: FOR INDIGESTION//PH   Vital Signs:  Patient profile:   77 year old female Weight:      128.8 pounds Temp:     97.7 degrees F oral Pulse (ortho):   64 / minute Resp:     15 per minute BP standing:   108 / 58  Vitals Entered By: Shonna Chock CMA (December 05, 2010 2:52 PM)  Serial Vital Signs/Assessments:  Time      Position  BP       Pulse  Resp  Temp     By 3:06 PM   Lying LA  122/70   64                    Chrae Malloy CMA 3:06 PM   Sitting   116/62   64                    Chrae Malloy CMA 3:06 PM   Standing  108/58   64                    Chrae Malloy CMA  CC: 1.) Indigestion: patient with frequent burping and yesterday at 5:30pm patient with intense pain that lasted til 9:30pm, patient took OTC meds 2.) Legs weak and dizzy x couple weeks, Abdominal pain Comments Patient here with her daughter: Neysa Bonito   Primary Care Provider:  [name], MD  CC:  1.) Indigestion: patient with frequent burping and yesterday at 5:30pm patient with intense pain that lasted til 9:30pm, patient took OTC meds 2.) Legs weak and dizzy x couple weeks, and Abdominal pain.  History of Present Illness:    Onset as abdominal pain last night from  5:30 - 9:30..  The patient reported  nausea and anorexia last night, but denies vomiting, diarrhea, constipation, melena, hematochezia, and hematemesis.  The location of the pain is epigastric.  The pain is described as sharp - cramping in quality, and radiating to the back.  The patient denies the following symptoms: fever, dysuria, dark urine, and vaginal bleeding.  The pain was  worse sitting.  The pain was  better with Pepto Bismol. Nexium taken irregularly  due to possible "interference with heart meds" PMH of IBS, diverticulosis, & GERD.She denies triggers for ERD. She has had intermittent dizziness.  Allergies: 1)  ! Sulfa 2)  Codeine 3)  * Mycins 4)  Cephalexin 5)  * Estrace 6)  * Analopram 7)  * Promethazine With Codeine 8)  * Chemdal 9)   * Hydrocortisone 10)  * Avelox  Past History:  Past Medical History: ASTHMATIC BRONCHITIS, ACUTE (ICD-466.0) ALLERGIC RHINITIS (ICD-477.9) Hx of ATRIAL FIBRILLATION (ICD-427.31) S/P AORTIC VALVE REPLACEMENT (CPT-33863),Dr Gerhart 2006, pacer 2010 , Dr Deborah Chalk Hx of SLEEP APNEA, MILD (ICD-780.57) CHRONIC OBSTRUCTIVE ASTHMA UNSPECIFIED (ICD-493.20) OSTEOPOROSIS (ICD-733.00) FATIGUE, CHRONIC (ICD-780.79) PRURITIC DISORDER NOS (ICD-698.9) Diverticulosis Hemorrhoids GERD IBS  Review of Systems Derm:  Complains of changes in nail beds; denies dryness; Hair fine . Neuro:  Complains of numbness and tingling; denies brief paralysis, disturbances in coordination, poor balance, sensation of room spinning, and weakness; N&T in forearms from elbows down  @ night. Some postural lightheadedness.  Physical Exam  General:  On O2,in no acute distress; appropriate and cooperative throughout examination Eyes:  No corneal or conjunctival inflammation noted.No lid lag Mouth:  Oral mucosa and oropharynx without lesions or exudates.   Mild pharyngeal erythema @ uvula.   Neck:  No deformities, masses, or tenderness noted.  Lungs:  Normal respiratory effort, chest expands symmetrically. Lungs are clear to auscultation, no crackles or wheezes but BS decreased. Heart:  normal rate, regular rhythm, no gallop, no rub, no JVD, and grade 1-1.5 /6 systolic murmur.   Abdomen:  Bowel sounds positive,abdomen soft  but slightly tender in epigastrium  without masses, organomegaly or hernias noted. Rectal:  Given stool cards Pulses:  R and L carotid,radial,dorsalis pedis and posterior tibial pulses are full and equal bilaterally Extremities:  No clubbing, cyanosis, edema.   Neurologic:  alert & oriented X3 and DTRs symmetrical and normal.   + Tinel's sign  RUE Skin:  Intact without suspicious lesions or rashes Cervical Nodes:  No lymphadenopathy noted Axillary Nodes:  No palpable lymphadenopathy Psych:  memory  intact for recent and remote, normally interactive, and good eye contact.     Impression & Recommendations:  Problem # 1:  ABDOMINAL PAIN, EPIGASTRIC (ICD-789.06)  probably GERD  Orders: Venipuncture ([address]) TLB-CBC Platelet - w/Differential (85025-CBCD) TLB-Amylase (82150-AMYL) TLB-ALT (SGPT) (84460-ALT) TLB-AST (SGOT) (84450-SGOT) TLB-Lipase (83690-LIPASE)  Problem # 2:  POSTURAL HYPOTENSION (ICD-458.0)  Orders: Venipuncture (60454) TLB-CBC Platelet - w/Differential (85025-CBCD)  Problem # 3:  OTHER SPECIFIED DISEASE OF NAIL (ICD-703.8)  Brittle; hair changes  Orders: Venipuncture (09811) TLB-TSH (Thyroid Stimulating Hormone) (84443-TSH) TLB-T4 (Thyrox), Free [phone])  Problem # 4:  PARESTHESIA, HANDS (ICD-782.0)  Orders: Venipuncture (21308) TLB-TSH (Thyroid Stimulating Hormone) (84443-TSH) TLB-T4 (Thyrox), Free [phone])  Complete Medication List: 1)  Dulera 200-5 Mcg/act Aero (Mometasone furo-formoterol fum) .... 2 puffs two times a day as needed 2)  Spiriva Handihaler 18 Mcg Caps (Tiotropium bromide monohydrate) .... Inhale contents of 1 capsule once a day 3)  Xopenex Hfa 45 Mcg/act Aero (Levalbuterol tartrate) .... 2 puffs four times a day as needed 4)  Oxygen 2.5 - 3 Liters  .... 24/7 5)  Xopenex 1.25 Mg/5ml Nebu (Levalbuterol hcl) .... Three times a day 6)  Allergy Vaccine 1:10 Gh  .... Starting build up 7)  Childrens Allergy 12.5 Mg/53ml Liqd (Diphenhydramine hcl) .... As directed on bottle 8)  Furosemide 40 Mg Tabs (Furosemide) .... 1/2 two times a day 9)  Lanoxin 0.125 Mg Tabs (Digoxin) .... Take 1 tablet by mouth once a day 10)  Cardizem La 240 Mg Tb24 (Diltiazem hcl coated beads) .Marland Kitchen.. 1 by mouth once daily **appointment due for additional refills** 11)  Klor-con 20 Meq Pack (Potassium chloride) .... Take 1 tablet by mouth two times a day 12)  Zetia 10 Mg Tabs (Ezetimibe) .Marland Kitchen.. 1 by mouth qd 13)  Warfarin Sodium 1 Mg Tabs (Warfarin sodium) .... As  directed 14)  Zoloft 50 Mg Tabs (Sertraline hcl) .... Take 1 tablet by mouth once a day 15)  Mucinex Maximum Strength 1200 Mg Xr12h-tab (Guaifenesin) .... Take 1 tablet by mouth two times a day 16)  Vitamin C 500 Mg Tabs (Ascorbic acid) .... Take 1 tablet by mouth once a day 17)  Stool Softener  .... Take 1 tablet by mouth once a day 18)  Cvs Saline Nasal Spray 0.65 % Soln (Saline) .... As needed 19)  Coq10 100 Mg Caps (Coenzyme q10) .... Take 1 tablet by mouth once a day 20)  Multivitamins Tabs (Multiple vitamin) .... Take 1 tablet by mouth once a day 21)  Nexium 40 Mg Cpdr (Esomeprazole magnesium) .... Take 1 tablet by mouth once a day 22)  Calcium Citrate + Tabs (Multiple minerals-vitamins) .Marland Kitchen.. 1 once daily 23)  Aerochamber Z-stat Plus Misc (Spacer/aero-holding chambers) .... Use with inhaler  as directed 24)  Sotalol Hcl 80 Mg Tabs (Sotalol hcl) .... 2 once daily 25)  Vitamin B-12 100 Mcg Tabs (Cyanocobalamin) .... Take 1 by mouth once daily 26)  Singulair 10 Mg Tabs (Montelukast sodium) .Marland Kitchen.. 1 daily for asthma 27)  Crestor 5 Mg Tabs (Rosuvastatin calcium) .... Take 1 tablet two times a week 28)  Duke's Magic Mouthwash  .... 1 teaspoon swish and swallow four times a day 29)  Prednisone 10 Mg Tabs (Prednisone) .Marland Kitchen.. 1-2 daily as needed. use sparingly. 30)  Tussionex Pennkinetic Er 10-8 Mg/56ml Lqcr (Hydrocod polst-chlorphen polst) .Marland Kitchen.. 1 teaspoon two times a day as needed cough.  Patient Instructions: 1)  Take Nexium two times a day X 10 days , then once daily in am . 2)  Avoid foods high in acid (tomatoes, citrus juices, spicy foods). Avoid eating within two hours of lying down or before exercising. Do not over eat; try smaller more frequent meals. Elevate head of bed twelve inches when sleeping. take Digoxin after 5 pm. Do Isometric pre standing up. Prescriptions: NEXIUM 40 MG CPDR (ESOMEPRAZOLE MAGNESIUM) Take 1 tablet by mouth once a day  #90 x 1   Entered and Authorized by:   [name] MD   Signed by:   [name] MD on 12/05/2010   Method used:   Electronically to        [id number] (retail)       [address]       [place], [place]  [address]       Ph: [phone] or [phone]       Fax: [phone]   RxID:   [id number]    Orders Added: 1)  Est. Patient Level IV [27253] 2)  Venipuncture [66440] 3)  TLB-CBC Platelet - w/Differential [85025-CBCD] 4)  TLB-TSH (Thyroid Stimulating Hormone) [84443-TSH] 5)  TLB-Amylase [82150-AMYL] 6)  TLB-ALT (SGPT) [84460-ALT] 7)  TLB-AST (SGOT) [84450-SGOT] 8)  TLB-Lipase [83690-LIPASE] 9)  TLB-T4 (Thyrox), Free [34742-VZ5G]  Appended Document: FOR INDIGESTION//PH   Appended Document: FOR INDIGESTION//PH Prescriptions: NEXIUM 40 MG CPDR (ESOMEPRAZOLE MAGNESIUM) Take 1 tablet by mouth once a day Brand medically necessary #90 x 1   Entered by:   Lucious Groves CMA   Authorized by:   [name] MD   Signed by:   Lucious Groves CMA on 12/05/2010   Method used:   Electronically to        [facility] (retail)       [address]       [place], [place]  [address]       Ph: [phone] or [phone]       Fax: [phone]   RxID:   [id number] LEVSIN/SL 0.125 MG SUBL (HYOSCYAMINE SULFATE) 1 by mouth every 6 hour as needed  #30 x 0   Entered by:   Shonna Chock CMA   Authorized by:   [name] MD   Signed by:   Shonna Chock CMA on 12/05/2010   Method used:   Electronically to        [facility] (retail)       [address]       [place], [place]  [address]       Ph: [phone] or [phone]       Fax: [phone]   RxID:   [id number]

## 2010-12-15 DIAGNOSIS — J301 Allergic rhinitis due to pollen: Secondary | ICD-10-CM

## 2010-12-21 NOTE — Letter (Signed)
Summary: Results Follow up Letter  [facility]  [address] [place], [place] [address]   Phone: [phone]  Fax: [phone]    12/13/2010 MRN: [id number]  [facility] [address] [place], [place]  [address]  [place]  Dear [name],  The following are the results of your recent test(s):  Test         Result    Pap Smear:        Normal _____  Not Normal _____ Comments: ______________________________________________________ Cholesterol: LDL(Bad cholesterol):         Your goal is less than:         HDL (Good cholesterol):       Your goal is more than: Comments:  ______________________________________________________ Mammogram:        Normal _____  Not Normal _____ Comments:  ___________________________________________________________________ Hemoccult:        Normal _X____  Not normal _______ Comments:    _____________________________________________________________________ Other Tests:    We routinely do not discuss normal results over the telephone.  If you desire a copy of the results, or you have any questions about this information we can discuss them at your next office visit.   Sincerely,

## 2010-12-22 ENCOUNTER — Ambulatory Visit (INDEPENDENT_AMBULATORY_CARE_PROVIDER_SITE_OTHER): Payer: Medicare Other

## 2010-12-22 DIAGNOSIS — J301 Allergic rhinitis due to pollen: Secondary | ICD-10-CM

## 2010-12-27 ENCOUNTER — Ambulatory Visit (INDEPENDENT_AMBULATORY_CARE_PROVIDER_SITE_OTHER): Payer: Medicare Other

## 2010-12-27 DIAGNOSIS — J301 Allergic rhinitis due to pollen: Secondary | ICD-10-CM

## 2011-01-03 ENCOUNTER — Encounter (INDEPENDENT_AMBULATORY_CARE_PROVIDER_SITE_OTHER): Payer: Medicare Other

## 2011-01-03 ENCOUNTER — Ambulatory Visit (INDEPENDENT_AMBULATORY_CARE_PROVIDER_SITE_OTHER): Payer: Medicare Other

## 2011-01-03 DIAGNOSIS — R079 Chest pain, unspecified: Secondary | ICD-10-CM

## 2011-01-03 DIAGNOSIS — J301 Allergic rhinitis due to pollen: Secondary | ICD-10-CM

## 2011-01-03 DIAGNOSIS — Z7901 Long term (current) use of anticoagulants: Secondary | ICD-10-CM

## 2011-01-04 ENCOUNTER — Telehealth: Payer: Self-pay | Admitting: Internal Medicine

## 2011-01-05 ENCOUNTER — Encounter: Payer: Self-pay | Admitting: Internal Medicine

## 2011-01-10 ENCOUNTER — Encounter: Payer: Self-pay | Admitting: Internal Medicine

## 2011-01-10 ENCOUNTER — Ambulatory Visit (INDEPENDENT_AMBULATORY_CARE_PROVIDER_SITE_OTHER): Payer: Medicare Other

## 2011-01-10 DIAGNOSIS — J301 Allergic rhinitis due to pollen: Secondary | ICD-10-CM | POA: Insufficient documentation

## 2011-01-11 NOTE — Progress Notes (Signed)
Summary: refill of Zoloft   ---- Converted from flag ---- ---- 01/04/2011 3:36 PM, Hilarie Fredrickson MD wrote: ok  ---- 01/04/2011 3:14 PM, Milford Cage NCMA wrote: Patient is wanting refill of her Zoloft (Sertraline).  Request sent from Pharmacy.  Will you ok this? ------------------------------

## 2011-01-12 ENCOUNTER — Encounter (INDEPENDENT_AMBULATORY_CARE_PROVIDER_SITE_OTHER): Payer: Self-pay | Admitting: *Deleted

## 2011-01-12 ENCOUNTER — Telehealth: Payer: Self-pay | Admitting: Internal Medicine

## 2011-01-16 NOTE — Letter (Signed)
Summary: Appointment - Reschedule  [facility], Main Office  [address]   [place], [place] [address]   Phone: [phone]  Fax: [phone]     January 12, 2011 MRN: [id number]   [facility] [address] [place], [place]  [address]   Dear Ms. Minogue,   Due to a change in our office schedule, your appointment on 01-31-2011                       at  1:30 p.m.   must be changed.  It is very important that we reach you to reschedule this appointment. We look forward to participating in your health care needs. Please contact us at the number listed above at your earliest convenience to reschedule this appointment.     Sincerely,      Lorne Skeens  [facility] Scheduling Team

## 2011-01-16 NOTE — Assessment & Plan Note (Signed)
Summary: ALLERGY/CB   Nurse Visit   Allergies: 1)  ! Sulfa 2)  Codeine 3)  * Mycins 4)  Cephalexin 5)  * Estrace 6)  * Analopram 7)  * Promethazine With Codeine 8)  * Chemdal 9)  * Hydrocortisone 10)  * Avelox  Orders Added: 1)  Allergy Injection (1) [25956]

## 2011-01-17 ENCOUNTER — Ambulatory Visit (INDEPENDENT_AMBULATORY_CARE_PROVIDER_SITE_OTHER): Payer: Medicare Other

## 2011-01-17 ENCOUNTER — Encounter: Payer: Self-pay | Admitting: Internal Medicine

## 2011-01-17 ENCOUNTER — Encounter (INDEPENDENT_AMBULATORY_CARE_PROVIDER_SITE_OTHER): Payer: Medicare Other

## 2011-01-17 DIAGNOSIS — J301 Allergic rhinitis due to pollen: Secondary | ICD-10-CM

## 2011-01-17 DIAGNOSIS — I4891 Unspecified atrial fibrillation: Secondary | ICD-10-CM

## 2011-01-17 DIAGNOSIS — Z7901 Long term (current) use of anticoagulants: Secondary | ICD-10-CM

## 2011-01-18 ENCOUNTER — Telehealth (INDEPENDENT_AMBULATORY_CARE_PROVIDER_SITE_OTHER): Payer: Self-pay | Admitting: *Deleted

## 2011-01-19 ENCOUNTER — Encounter: Payer: Self-pay | Admitting: Internal Medicine

## 2011-01-19 ENCOUNTER — Ambulatory Visit (INDEPENDENT_AMBULATORY_CARE_PROVIDER_SITE_OTHER): Payer: Medicare Other | Admitting: Internal Medicine

## 2011-01-19 DIAGNOSIS — J301 Allergic rhinitis due to pollen: Secondary | ICD-10-CM

## 2011-01-19 DIAGNOSIS — J209 Acute bronchitis, unspecified: Secondary | ICD-10-CM

## 2011-01-19 DIAGNOSIS — R042 Hemoptysis: Secondary | ICD-10-CM

## 2011-01-22 ENCOUNTER — Telehealth (INDEPENDENT_AMBULATORY_CARE_PROVIDER_SITE_OTHER): Payer: Self-pay | Admitting: *Deleted

## 2011-01-23 NOTE — Assessment & Plan Note (Signed)
Summary: acute visit-dizzy,weak,SOB,congestion/kcw   Copy to:  n.a Primary Provider/Referring Provider:  [name], MD  CC:  Acute visit-dizzy and cough brown in color;Increased SOB and low O2 levels..  History of Present Illness: CXR- LLL infilt new Husband in icu after fall, injuring hip. She is stressed and tearful about this. She is too weak and dyspneic to walk distance through [facility] to visit him.  Started hemoptysis Nov 10 x 5 days, now stopped. Denies fever or purulent, but stays yellow. She doesn't feel she needs antibiotic, but asks for prednisone for dyspnea. Feels tight across upper abdomen w/ no pain. Denies anginal pain, palpitation, blood, change in bowel or bladder.   November 20, 2010-  Asthma, rhinosinusitis, allergic rhinitis, hemoptysis, hx AVR. AF/ coumadin Reports hemoptysis agan, on coumadin Nurse-CC: 4 MONTH FOLLOW UP VISIT-allergies. CXR 09/26/10- pneumonia, CE w/o CHF Continues allergy vaccine.  Grief after husband's death.  For the past 4-6 weeks she has been concerned about her hoarseness. Often globus, but not constant. Wakes with tongue coated- brushes it. Children's benadry helps. Always some yellow mucus, either nose or chest. Cold air causes increased chest congestion. Has few days self-limited hemoptysis and epistaxis. Used some of her husband's left over prednisone last month- helped. Asks to hold scripts for cough syrup, prednisone and magic mouthwash.   January 19, 2011-  Asthma, rhinosinusitis, allergic rhinitis, hemoptysis, hx AVR. AF/ coumadin Nurse-CC: Acute visit-dizzy,cough brown in color;Increased SOB and low O2 levels. She called yesterday acutely- woke during night with short of breath, O2 sat 85 on her O2, head congestion. In last few weeks more short of breath, blaming pollen. Brown sputum, maybe trace blood. No fever. on her call yesterday we had her take prednisone 40 mg, and started doxycycline. She feels much better today.      Preventive Screening-Counseling & Management  Alcohol-Tobacco     Smoking Status: quit     Year Quit: 1995     Pack years: 50 years 1 pack daily     Tobacco Counseling: to remain off tobacco products  Current Medications (verified): 1)  Dulera 200-5 Mcg/act Aero (Mometasone Furo-Formoterol Fum) .... 2 Puffs Two Times A Day As Needed 2)  Spiriva Handihaler 18 Mcg  Caps (Tiotropium Bromide Monohydrate) .... Inhale Contents of 1 Capsule Once A Day 3)  Xopenex Hfa 45 Mcg/act  Aero (Levalbuterol Tartrate) .... 2 Puffs Four Times A Day As Needed 4)  Oxygen 2.5 - 3 Liters .... 24/7 5)  Xopenex 1.25 Mg/94ml  Nebu (Levalbuterol Hcl) .... Three Times A Day 6)  Allergy Vaccine 1:10 Gh .... Starting Build Up 7)  Childrens Allergy 12.5 Mg/57ml Liqd (Diphenhydramine Hcl) .... As Directed On Bottle 8)  Furosemide 40 Mg  Tabs (Furosemide) .... 1/2 Two Times A Day 9)  Lanoxin 0.125 Mg  Tabs (Digoxin) .... Take 1 Tablet By Mouth Once A Day 10)  Klor-Con 20 Meq  Pack (Potassium Chloride) .... Take 1 Tablet By Mouth Two Times A Day 11)  Zetia 10 Mg  Tabs (Ezetimibe) .Marland Kitchen.. 1 By Mouth Qd 12)  Warfarin Sodium 1 Mg  Tabs (Warfarin Sodium) .... As Directed 13)  Zoloft 50 Mg  Tabs (Sertraline Hcl) .... Take 1 Tablet By Mouth Once A Day 14)  Mucinex Maximum Strength 1200 Mg Xr12h-Tab (Guaifenesin) .... Take 1 Tablet By Mouth Two Times A Day 15)  Vitamin C 500 Mg  Tabs (Ascorbic Acid) .... Take 1 Tablet By Mouth Once A Day 16)  Stool Softener .Marland KitchenMarland KitchenMarland Kitchen  Take 1 Tablet By Mouth Once A Day 17)  Cvs Saline Nasal Spray 0.65 %  Soln (Saline) .... As Needed 18)  Coq10 100 Mg  Caps (Coenzyme Q10) .... Take 1 Tablet By Mouth Once A Day 19)  Multivitamins   Tabs (Multiple Vitamin) .... Take 1 Tablet By Mouth Once A Day 20)  Nexium 40 Mg Cpdr (Esomeprazole Magnesium) .... Take 1 Tablet By Mouth Once A Day 21)  Calcium Citrate +  Tabs (Multiple Minerals-Vitamins) .Marland Kitchen.. 1 Once Daily 22)  Aerochamber Z-Stat Plus  Misc  (Spacer/aero-Holding Chambers) .... Use With Inhaler As Directed 23)  Sotalol Hcl 80 Mg Tabs (Sotalol Hcl) .... 2 Once Daily 24)  Vitamin B-12 100 Mcg Tabs (Cyanocobalamin) .... Take 1 By Mouth Once Daily 25)  Singulair 10 Mg Tabs (Montelukast Sodium) .Marland Kitchen.. 1 Daily For Asthma 26)  Duke's Magic Mouthwash .... 1 Teaspoon Swish and Swallow Four Times A Day 27)  Prednisone 10 Mg Tabs (Prednisone) .Marland Kitchen.. 1-2 Daily As Needed. Use Sparingly. 28)  Tussionex Pennkinetic Er 10-8 Mg/70ml Lqcr (Hydrocod Polst-Chlorphen Polst) .Marland Kitchen.. 1 Teaspoon Two Times A Day As Needed Cough. 29)  Doxycycline Hyclate 100 Mg Caps (Doxycycline Hyclate) .Marland Kitchen.. 1 By Mouth Two Times A Day  Allergies (verified): 1)  ! Sulfa 2)  Codeine 3)  * Mycins 4)  Cephalexin 5)  * Estrace 6)  * Analopram 7)  * Promethazine With Codeine 8)  * Chemdal 9)  * Hydrocortisone 10)  * Avelox  Past History:  Past Medical History: Last updated: 12/05/2010 ASTHMATIC BRONCHITIS, ACUTE (ICD-466.0) ALLERGIC RHINITIS (ICD-477.9) Hx of ATRIAL FIBRILLATION (ICD-427.31) S/P AORTIC VALVE REPLACEMENT (CPT-33863),Dr Gerhart 2006, pacer 2010 , Dr Deborah Chalk Hx of SLEEP APNEA, MILD (ICD-780.57) CHRONIC OBSTRUCTIVE ASTHMA UNSPECIFIED (ICD-493.20) OSTEOPOROSIS (ICD-733.00) FATIGUE, CHRONIC (ICD-780.79) PRURITIC DISORDER NOS (ICD-698.9) Diverticulosis Hemorrhoids GERD IBS  Past Surgical History: Last updated: 07/17/2010 back surgery 1994 Tonsillectomy Appendectomy ganglionectomy vocal cord polyps 1975 and 1984 pneumonia 1980 atrial fibrillation pneumonia 10/2003 aortic valve replacement 02/2005 Pace maker June 2010 Cardiac Ablation  Family History: Last updated: 07/17/2010 father renal failure paternal aunt colon cancer mother value disease  CABG MATERNAL GRANDMOTHER CVA MATERNAL GRANDFATHER HEART DISEASE PATERNAL GRANDFATHER mi AUNT HTN Family History of Breast Cancer: Daughter  Social History: Last updated: 11/20/2010 Patient  states former smoker- quit 1995 Widowed  Alcohol Use - no Illicit Drug Use - no  Risk Factors: Exercise: yes (10/23/2007)  Risk Factors: Smoking Status: quit (01/19/2011)  Review of Systems      See HPI       The patient complains of shortness of breath with activity, productive cough, loss of appetite, and headaches.  The patient denies shortness of breath at rest, non-productive cough, coughing up blood, chest pain, irregular heartbeats, acid heartburn, indigestion, weight change, abdominal pain, difficulty swallowing, sore throat, tooth/dental problems, nasal congestion/difficulty breathing through nose, sneezing, rash, change in color of mucus, and fever.    Vital Signs:  Patient profile:   77 year old female Height:      62.5 inches Weight:      130.50 pounds BMI:     23.57 O2 Sat:      92 % on 3 L/min Pulse rate:   63 / minute BP sitting:   110 / 62  (left arm) Cuff size:   regular  Vitals Entered By: Reynaldo Minium CMA (January 19, 2011 10:45 AM)  O2 Flow:  3 L/min CC: Acute visit-dizzy,cough brown in color;Increased SOB and low O2 levels. Comments O2 on  RA:87% O2 on Oxygen 3l/m: 92%.Reynaldo Minium CMA  January 19, 2011 10:45 AM    Physical Exam  Additional Exam:  General: A/Ox3; pleasant and cooperative, NAD, supplemental oxygen- saturation 92% at rest on 3L demand regulator SKIN: ndry scaling thickened skin on elbows. NODES: no lymphadenopathy HEENT: [place]/AT, EOM- WNL, Conjuctivae- clear, PERRLA, TM-WNL, Nose- mucosal erosion, Throat- clear and wnl, Mallampatii II, oral mucosa ok NECK: Supple w/ fair ROM, JVD- none, normal carotid impulses w/o bruits Thyroid- CHEST: diminished , no dullness,  quiet/ distant. Few crackles in bases, unlabored. Mild cough. Sputum looks like mucus and old blood.  HEART: RRR, 2/6 systolic precordial murmur ABDOMEN: soft AVW:UJWJ, nl pulses, no edema  NEURO: Grossly intact to observation,no nystagmus      Impression &  Recommendations:  Problem # 1:  ASTHMATIC BRONCHITIS, ACUTE (ICD-466.0) Recent exacerbation is likely infection/ bronchitis. It seems to be responding already to prednisone and antibiotic.  We will direct a prednisone taper and finish the doxy.  Her updated medication list for this problem includes:    Dulera 200-5 Mcg/act Aero (Mometasone furo-formoterol fum) .Marland Kitchen... 2 puffs two times a day as needed    Spiriva Handihaler 18 Mcg Caps (Tiotropium bromide monohydrate) ..... Inhale contents of 1 capsule once a day    Xopenex Hfa 45 Mcg/act Aero (Levalbuterol tartrate) .Marland Kitchen... 2 puffs four times a day as needed    Xopenex 1.25 Mg/15ml Nebu (Levalbuterol hcl) .Marland Kitchen... Three times a day    Mucinex Maximum Strength 1200 Mg Xr12h-tab (Guaifenesin) .Marland Kitchen... Take 1 tablet by mouth two times a day    Singulair 10 Mg Tabs (Montelukast sodium) .Marland Kitchen... 1 daily for asthma    Tussionex Pennkinetic Er 10-8 Mg/17ml Lqcr (Hydrocod polst-chlorphen polst) .Marland Kitchen... 1 teaspoon two times a day as needed cough.    Doxycycline Hyclate 100 Mg Caps (Doxycycline hyclate) .Marland Kitchen... 1 by mouth two times a day  Problem # 2:  HEMOPTYSIS UNSPECIFIED (ICD-786.30)  Minor hemorrhagic bronchitis on coumadin. Discussed interaction with antibiotics. Will CXR later if needed.  Her updated medication list for this problem includes:    Dulera 200-5 Mcg/act Aero (Mometasone furo-formoterol fum) .Marland Kitchen... 2 puffs two times a day as needed    Spiriva Handihaler 18 Mcg Caps (Tiotropium bromide monohydrate) ..... Inhale contents of 1 capsule once a day    Xopenex Hfa 45 Mcg/act Aero (Levalbuterol tartrate) .Marland Kitchen... 2 puffs four times a day as needed    Xopenex 1.25 Mg/46ml Nebu (Levalbuterol hcl) .Marland Kitchen... Three times a day    Vitamin C 500 Mg Tabs (Ascorbic acid) .Marland Kitchen... Take 1 tablet by mouth once a day    Multivitamins Tabs (Multiple vitamin) .Marland Kitchen... Take 1 tablet by mouth once a day    Singulair 10 Mg Tabs (Montelukast sodium) .Marland Kitchen... 1 daily for asthma    Prednisone 10  Mg Tabs (Prednisone) .Marland Kitchen... 1-2 daily as needed. use sparingly.    Doxycycline Hyclate 100 Mg Caps (Doxycycline hyclate) .Marland Kitchen... 1 by mouth two times a day  Other Orders: Est. Patient Level III (19147)  Patient Instructions: 1)  Keep appointment April 16 unless needed sooner 2)  Finish doxycycline antibiotic 3)  prednisone taper - your 10 mg pills: 4)  4 x 2 days 5)  3 x 2 days 6)  2 x 2 days 7)  1 x 2 days, then stop.

## 2011-01-23 NOTE — Assessment & Plan Note (Signed)
Summary: allergy/cb   Nurse Visit   Allergies: 1)  ! Sulfa 2)  Codeine 3)  * Mycins 4)  Cephalexin 5)  * Estrace 6)  * Analopram 7)  * Promethazine With Codeine 8)  * Chemdal 9)  * Hydrocortisone 10)  * Avelox  Orders Added: 1)  Allergy Injection (1) [04540]

## 2011-01-23 NOTE — Progress Notes (Signed)
Summary: xopenex  Phone Note Call from Patient Call back at Home Phone [phone]   Caller: Patient Call For: young Summary of Call: pt says the rx for xopenex is not enough. she requested 90 day supply as this is cheaper and also because she takes this 3 x a day. [facility]- call pt before 11:30 at home # or after 1:30 today.  Initial call taken by: Tivis Ringer, CNA,  January 12, 2011 11:15 AM  Follow-up for Phone Call        ATC pt and line was busy x 2  Vernie Murders  January 12, 2011 3:42 PM  Spoke with pt and she states she was only given enough xopenex for 2 weeks and she states she usually gets 3 month. According to last rx sent quanity was only #23, so I called [facility] and advised thsi was supposed to be for a 90 day supply.  Rx corrected. pt aware.Carron Curie CMA  January 15, 2011 9:01 AM     Prescriptions: XOPENEX 1.25 MG/3ML  NEBU (LEVALBUTEROL HCL) three times a day  #90 day x 3   Entered by:   Carron Curie CMA   Authorized by:   Waymon Budge MD   Signed by:   Carron Curie CMA on 01/15/2011   Method used:   Telephoned to ...       [facility]* (retail)             ,          Ph: [phone]       Fax: [phone]   RxID:   [id number]

## 2011-01-23 NOTE — Progress Notes (Signed)
Summary: needs to be seen sob, dizzy--apt sched 3/16 at 10:00  Phone Note Call from Patient Call back at [facility] Phone [phone]   Caller: Patient Call For: [name] Summary of Call: Patient phoned stated that she needs to be seen. She is dizzy, very congested, coughing brown mucas, chest hurts, and she had to use rescue inhailer last night because she couldnt breath. She can be reached at [phone]. she uses [facility] Initial call taken by: Vedia Coffer,  January 18, 2011 10:02 AM  Follow-up for Phone Call        atc pt-- "the party I am trying to call is unavailable please try your call again later". Carver Fila  January 18, 2011 10:32 AM   called and spoke with pt and she c/o increased SOB, congestion, feels dizzy and weak, has had loss of apetite, cough w/ brown phlem, and her chest hurts. Pt states she has been using her rescue inhaler but is not helping. Pt states she has 10mg  prednisone and cough syrup but has not taken it. Pt states she needs to be seen today or tomorrow if any way possible. Dr. Maple Hudson please advise recommendation for patient or if any way patient can be worked in. Thanks.   Allergies: 1)  ! Sulfa 2)  Codeine 3)  * Mycins 4)  Cephalexin 5)  * Estrace 6)  * Analopram 7)  * Promethazine With Codeine 8)  * Chemdal 9)  * Hydrocortisone 10)  * Avelox  Mindy Silva  January 18, 2011 2:02 PM   Additional Follow-up for Phone Call Additional follow up Details #1::        Per [name]-ok to start prednisone 40mg  today and tomorrow and Doxycycline 100mg  #14 take 1 by mouth two times a day no refills and be here for appt tomorrow morning 01-19-11 at 10am with [name].Reynaldo Minium CMA  January 18, 2011 2:20 PM   Called and spoke with patient and informed her of cdy recs and she verbalized understanding. i also informed her rx was sent to pharmacy and also of her apt 3/16 at 10:00 a.m.  Mindy Silva  January 18, 2011 2:30 PM     New/Updated Medications: DOXYCYCLINE HYCLATE 100  MG CAPS (DOXYCYCLINE HYCLATE) 1 by mouth two times a day Prescriptions: DOXYCYCLINE HYCLATE 100 MG CAPS (DOXYCYCLINE HYCLATE) 1 by mouth two times a day  #14 x 0   Entered by:   Carver Fila   Authorized by:   Waymon Budge MD   Signed by:   Carver Fila on 01/18/2011   Method used:   [facility]ronically to        [id number]       [address]       [place], [place]  [address]       Ph: [phone] or [phone]       Fax: [phone]   RxID:   [id number]

## 2011-01-25 ENCOUNTER — Ambulatory Visit (INDEPENDENT_AMBULATORY_CARE_PROVIDER_SITE_OTHER): Payer: Medicare Other

## 2011-01-25 DIAGNOSIS — J301 Allergic rhinitis due to pollen: Secondary | ICD-10-CM

## 2011-01-31 ENCOUNTER — Encounter: Payer: Medicare Other | Admitting: Internal Medicine

## 2011-01-31 ENCOUNTER — Ambulatory Visit (INDEPENDENT_AMBULATORY_CARE_PROVIDER_SITE_OTHER): Payer: Medicare Other

## 2011-01-31 ENCOUNTER — Ambulatory Visit (INDEPENDENT_AMBULATORY_CARE_PROVIDER_SITE_OTHER): Payer: Medicare Other | Admitting: *Deleted

## 2011-01-31 DIAGNOSIS — Z7901 Long term (current) use of anticoagulants: Secondary | ICD-10-CM

## 2011-01-31 DIAGNOSIS — I4891 Unspecified atrial fibrillation: Secondary | ICD-10-CM

## 2011-01-31 DIAGNOSIS — J301 Allergic rhinitis due to pollen: Secondary | ICD-10-CM

## 2011-01-31 LAB — POCT INR: INR: 2.5

## 2011-02-01 NOTE — Progress Notes (Signed)
Summary: pt is worse spitting up more blood  Phone Note Call from Patient   Caller: Patient Call For: Young Summary of Call: Patient phoned stated that she saw Dr Maple Hudson on Friday and over the weekend she has been worse and spitting up more blood and she stated Friday night it was much worse and last night it was much worse and she thinks that her stool was a little darker. She isnt sure where the blood is coming from but is very concerned because it is worse than it has ever been. She can be reached at [phone] Initial call taken by: Vedia Coffer,  January 22, 2011 9:23 AM  Follow-up for Phone Call        Pt reports coughing up blood tinged sputum on Friday and Saturday totaling approx 1 tsp.  Hemoptysis seems to occur at night after using HHN.  Pt also noticed that stool was darker this weekend but thinks this may be due to eating Brocoli.  Pt denies seeing any bright red blood.  PT is still on Doxy and Pred taper.  Please advise. Abigail Miyamoto RN  January 22, 2011 11:07 AM   Additional Follow-up for Phone Call Additional follow up Details #1::        Per [name]-ok to watch the bleeding-see if it stops and finish the pred and doxy rx's.Reynaldo Minium CMA  January 22, 2011 11:36 AM     Additional Follow-up for Phone Call Additional follow up Details #2::    Pt given Dr Roxy Cedar instructions.  Pt to call back if symptoms do not improve. Abigail Miyamoto RN  January 22, 2011 12:03 PM

## 2011-02-07 ENCOUNTER — Ambulatory Visit (INDEPENDENT_AMBULATORY_CARE_PROVIDER_SITE_OTHER): Payer: Medicare Other

## 2011-02-07 DIAGNOSIS — J301 Allergic rhinitis due to pollen: Secondary | ICD-10-CM

## 2011-02-13 LAB — CBC
HCT: 33.2 % — ABNORMAL LOW (ref 36.0–46.0)
HCT: 33.4 % — ABNORMAL LOW (ref 36.0–46.0)
HCT: 35 % — ABNORMAL LOW (ref 36.0–46.0)
HCT: 36.9 % (ref 36.0–46.0)
HCT: 37.2 % (ref 36.0–46.0)
Hemoglobin: 10.5 g/dL — ABNORMAL LOW (ref 12.0–15.0)
Hemoglobin: 10.9 g/dL — ABNORMAL LOW (ref 12.0–15.0)
Hemoglobin: 11.1 g/dL — ABNORMAL LOW (ref 12.0–15.0)
Hemoglobin: 11.7 g/dL — ABNORMAL LOW (ref 12.0–15.0)
Hemoglobin: 12.2 g/dL (ref 12.0–15.0)
Hemoglobin: 12.6 g/dL (ref 12.0–15.0)
Hemoglobin: 12.6 g/dL (ref 12.0–15.0)
MCHC: 33.1 g/dL (ref 30.0–36.0)
MCHC: 33.3 g/dL (ref 30.0–36.0)
MCHC: 33.4 g/dL (ref 30.0–36.0)
MCV: 80.4 fL (ref 78.0–100.0)
MCV: 80.6 fL (ref 78.0–100.0)
Platelets: 239 10*3/uL (ref 150–400)
RBC: 4.13 MIL/uL (ref 3.87–5.11)
RBC: 4.16 MIL/uL (ref 3.87–5.11)
RBC: 4.27 MIL/uL (ref 3.87–5.11)
RBC: 4.58 MIL/uL (ref 3.87–5.11)
RDW: 15.4 % (ref 11.5–15.5)
RDW: 15.5 % (ref 11.5–15.5)
RDW: 15.8 % — ABNORMAL HIGH (ref 11.5–15.5)
RDW: 15.8 % — ABNORMAL HIGH (ref 11.5–15.5)
WBC: 10.1 10*3/uL (ref 4.0–10.5)
WBC: 11.2 10*3/uL — ABNORMAL HIGH (ref 4.0–10.5)
WBC: 16.6 10*3/uL — ABNORMAL HIGH (ref 4.0–10.5)
WBC: 7.1 10*3/uL (ref 4.0–10.5)

## 2011-02-13 LAB — URINALYSIS, MICROSCOPIC ONLY
Leukocytes, UA: NEGATIVE
Protein, ur: NEGATIVE mg/dL
Urobilinogen, UA: 0.2 mg/dL (ref 0.0–1.0)

## 2011-02-13 LAB — PROTIME-INR
INR: 1.3 (ref 0.00–1.49)
INR: 1.8 — ABNORMAL HIGH (ref 0.00–1.49)
INR: 2.5 — ABNORMAL HIGH (ref 0.00–1.49)
INR: 2.7 — ABNORMAL HIGH (ref 0.00–1.49)
INR: 3.3 — ABNORMAL HIGH (ref 0.00–1.49)
INR: 3.7 — ABNORMAL HIGH (ref 0.00–1.49)
Prothrombin Time: 28.1 seconds — ABNORMAL HIGH (ref 11.6–15.2)
Prothrombin Time: 28.7 seconds — ABNORMAL HIGH (ref 11.6–15.2)
Prothrombin Time: 34.1 seconds — ABNORMAL HIGH (ref 11.6–15.2)
Prothrombin Time: 36.4 seconds — ABNORMAL HIGH (ref 11.6–15.2)
Prothrombin Time: 39.4 seconds — ABNORMAL HIGH (ref 11.6–15.2)

## 2011-02-13 LAB — BASIC METABOLIC PANEL
BUN: 16 mg/dL (ref 6–23)
CO2: 29 mEq/L (ref 19–32)
CO2: 31 mEq/L (ref 19–32)
CO2: 32 mEq/L (ref 19–32)
Calcium: 8.8 mg/dL (ref 8.4–10.5)
Calcium: 8.9 mg/dL (ref 8.4–10.5)
Calcium: 8.9 mg/dL (ref 8.4–10.5)
Calcium: 9.2 mg/dL (ref 8.4–10.5)
Chloride: 96 mEq/L (ref 96–112)
Creatinine, Ser: 0.68 mg/dL (ref 0.4–1.2)
Creatinine, Ser: 0.73 mg/dL (ref 0.4–1.2)
Creatinine, Ser: 0.93 mg/dL (ref 0.4–1.2)
GFR calc Af Amer: >60 mL/min (ref >60)
GFR calc Af Amer: >60 mL/min (ref >60)
GFR calc non Af Amer: 58 mL/min — ABNORMAL LOW (ref >60)
GFR calc non Af Amer: >60 mL/min (ref >60)
GFR calc non Af Amer: >60 mL/min (ref >60)
Glucose, Bld: 104 mg/dL — ABNORMAL HIGH (ref 70–99)
Glucose, Bld: 112 mg/dL — ABNORMAL HIGH (ref 70–99)
Potassium: 3.6 mEq/L (ref 3.5–5.1)
Potassium: 4.2 mEq/L (ref 3.5–5.1)
Sodium: 135 mEq/L (ref 135–145)
Sodium: 138 mEq/L (ref 135–145)
Sodium: 140 mEq/L (ref 135–145)

## 2011-02-13 LAB — COMPREHENSIVE METABOLIC PANEL
ALT: 40 U/L — ABNORMAL HIGH (ref 0–35)
Albumin: 3 g/dL — ABNORMAL LOW (ref 3.5–5.2)
Alkaline Phosphatase: 43 U/L (ref 39–117)
Chloride: 99 mEq/L (ref 96–112)
Glucose, Bld: 90 mg/dL (ref 70–99)
Potassium: 4 mEq/L (ref 3.5–5.1)
Sodium: 138 mEq/L (ref 135–145)
Total Protein: 5.6 g/dL — ABNORMAL LOW (ref 6.0–8.3)

## 2011-02-13 LAB — POCT I-STAT 3, ART BLOOD GAS (G3+)
Bicarbonate: 27 mEq/L — ABNORMAL HIGH (ref 20.0–24.0)
TCO2: 28 mmol/L (ref 0–100)
pCO2 arterial: 36.1 mmHg (ref 35.0–45.0)
pH, Arterial: 7.481 — ABNORMAL HIGH (ref 7.350–7.400)
pO2, Arterial: 54 mmHg — ABNORMAL LOW (ref 80.0–100.0)

## 2011-02-13 LAB — URINALYSIS, ROUTINE W REFLEX MICROSCOPIC
Ketones, ur: NEGATIVE mg/dL
Nitrite: NEGATIVE
Urobilinogen, UA: 0.2 mg/dL (ref 0.0–1.0)
pH: 5.5 (ref 5.0–8.0)

## 2011-02-13 LAB — GLUCOSE, CAPILLARY: Glucose-Capillary: 104 mg/dL — ABNORMAL HIGH (ref 70–99)

## 2011-02-13 LAB — POCT I-STAT, CHEM 8
Calcium, Ion: 1.11 mmol/L — ABNORMAL LOW (ref 1.12–1.32)
Chloride: 99 mEq/L (ref 96–112)
Glucose, Bld: 108 mg/dL — ABNORMAL HIGH (ref 70–99)
HCT: 35 % — ABNORMAL LOW (ref 36.0–46.0)
Hemoglobin: 11.9 g/dL — ABNORMAL LOW (ref 12.0–15.0)
Potassium: 3.5 mEq/L (ref 3.5–5.1)

## 2011-02-13 LAB — DIFFERENTIAL
Basophils Absolute: 0 10*3/uL (ref 0.0–0.1)
Eosinophils Relative: 1 % (ref 0–5)
Lymphocytes Relative: 6 % — ABNORMAL LOW (ref 12–46)
Lymphs Abs: 0.6 10*3/uL — ABNORMAL LOW (ref 0.7–4.0)
Monocytes Absolute: 0.7 10*3/uL (ref 0.1–1.0)
Neutro Abs: 9.8 10*3/uL — ABNORMAL HIGH (ref 1.7–7.7)

## 2011-02-13 LAB — APTT: aPTT: 48 seconds — ABNORMAL HIGH (ref 24–37)

## 2011-02-13 LAB — POCT CARDIAC MARKERS
CKMB, poc: 3.1 ng/mL (ref 1.0–8.0)
Troponin i, poc: <0.05 ng/mL (ref 0.00–0.09)

## 2011-02-13 LAB — URINE CULTURE
Colony Count: NO GROWTH
Culture: NO GROWTH

## 2011-02-13 LAB — CK TOTAL AND CKMB (NOT AT ARMC): Relative Index: 3.2 — ABNORMAL HIGH (ref 0.0–2.5)

## 2011-02-13 LAB — TROPONIN I: Troponin I: 0.03 ng/mL (ref 0.00–0.06)

## 2011-02-13 LAB — BRAIN NATRIURETIC PEPTIDE: Pro B Natriuretic peptide (BNP): 83 pg/mL (ref 0.0–100.0)

## 2011-02-14 ENCOUNTER — Ambulatory Visit (INDEPENDENT_AMBULATORY_CARE_PROVIDER_SITE_OTHER): Payer: Medicare Other

## 2011-02-14 DIAGNOSIS — J309 Allergic rhinitis, unspecified: Secondary | ICD-10-CM

## 2011-02-15 ENCOUNTER — Ambulatory Visit (INDEPENDENT_AMBULATORY_CARE_PROVIDER_SITE_OTHER): Payer: Medicare Other

## 2011-02-15 DIAGNOSIS — J309 Allergic rhinitis, unspecified: Secondary | ICD-10-CM

## 2011-02-16 ENCOUNTER — Encounter: Payer: Self-pay | Admitting: Internal Medicine

## 2011-02-19 ENCOUNTER — Ambulatory Visit (INDEPENDENT_AMBULATORY_CARE_PROVIDER_SITE_OTHER): Payer: Medicare Other | Admitting: Internal Medicine

## 2011-02-19 ENCOUNTER — Encounter: Payer: Self-pay | Admitting: Internal Medicine

## 2011-02-19 VITALS — BP 90/58 | HR 67 | Ht 62.0 in | Wt 128.4 lb

## 2011-02-19 DIAGNOSIS — J4489 Other specified chronic obstructive pulmonary disease: Secondary | ICD-10-CM

## 2011-02-19 DIAGNOSIS — J449 Chronic obstructive pulmonary disease, unspecified: Secondary | ICD-10-CM

## 2011-02-19 DIAGNOSIS — J209 Acute bronchitis, unspecified: Secondary | ICD-10-CM

## 2011-02-19 MED ORDER — AMOXICILLIN-POT CLAVULANATE 875-125 MG PO TABS: 1.0000 | ORAL_TABLET | Freq: Two times a day (BID) | ORAL | Status: AC

## 2011-02-19 MED ORDER — LEVALBUTEROL HCL 1.25 MG/3ML IN NEBU: 1.0000 | INHALATION_SOLUTION | Freq: Three times a day (TID) | RESPIRATORY_TRACT | Status: DC | PRN

## 2011-02-19 NOTE — Assessment & Plan Note (Signed)
Acute rhinitis and bronchitis. We discussed meds and management. We will refill Xopenex neb solution (needed due to PAFib) Script augmentin- she assures me she can take this.

## 2011-02-19 NOTE — Patient Instructions (Signed)
Script for augmentin antibiotic sent  Please call if you aren't feeling better by Thursday.

## 2011-02-19 NOTE — Progress Notes (Signed)
  Subjective:    Patient ID: [name], female    DOB: 19-Jun-1924, 77 y.o.   MRN: [id number]  HPI 49 yoF former smoker followed for COPD, chronic hypoxic respiratory failure, complicated by atrial fibrillation, valvular heart disease and intermittent hemoptysis. Daughter here. Last here January 19, 2011, feeling dizzy and brown sputum. She did get better. Then at end of March she took some left over prednsione for dyspnea with throat irritation. Now, 1 week ago she noted increased nasal discharge- mucus and blood. 3 days ago she began coughing more, with increasing nasal congestion. By yesterday she mostly stayed in bed because of weakness. She thinks she had fever yesterday. Sputum now green.  Review of Systems See HPI Constitutional:   No weight loss, night sweats,HEENT:   No headaches,  Difficulty swallowing,  Tooth/dental problems,                No sneezing, itching, ear ache,, post nasal drip,   CV:  No chest pain,  Orthopnea, PND, swelling in lower extremities, anasarca, dizziness, palpitations  GI  No heartburn, indigestion, abdominal pain, nausea, vomiting, diarrhea, change in bowel habits, loss of appetite  Resp:   No excess mucus, no productive cough,  No non-productive cough,  No coughing up of blood.  Skin: no rash or lesions.  GU: no dysuria, change in color of urine, no urgency or frequency.  No flank pain.  MS:  No joint pain or swelling.  No decreased range of motion.  No back pain.  Psych:  No change in mood or affect. No depression or anxiety.  No memory loss.      Objective:   Physical Exam General- Alert, Oriented, Affect-appropriate, Distress- none acute   Oxygen 2-3 L/M- now 2  Skin- rash-none, lesions- none, excoriation- none  Lymphadenopathy- none  Head- atraumatic  Eyes- Gross vision intact, PERRLA, conjunctivae clear secretions  Ears- Hearing normal  canals, Tm L , R ,  Nose- Clear,No-  Septal dev, mucus, polyps, erosion, perforation   Throat-  Mallampati II , mucosa clear , drainage- none, tonsils- atrophic  Neck- flexible , trachea midline, no stridor , thyroid nl, carotid no bruit  Chest - symmetrical excursion , unlabored     Heart/CV- RRR , 2/ 6 systolic left sternal border murmur , no gallop  , no rub, nl s1 s2                     - JVD- none , edema- none, stasis changes- none, varices- none     Lung-distant, wheeze- none, cough- upper airway rattle.  , dullness-none, rub- none     Chest wall-   Abd- tender-no, distended-no, bowel sounds-present, HSM- no  Br/ Gen/ Rectal- Not done, not indicated  Extrem- cyanosis- none, clubbing, none, atrophy- none, strength- nl  Neuro- grossly intact to observation        Assessment & Plan:

## 2011-02-20 ENCOUNTER — Other Ambulatory Visit: Payer: Self-pay | Admitting: Internal Medicine

## 2011-02-22 ENCOUNTER — Telehealth: Payer: Self-pay | Admitting: Internal Medicine

## 2011-02-22 MED ORDER — PREDNISONE 10 MG PO TABS: ORAL_TABLET | ORAL | Status: DC

## 2011-02-22 NOTE — Telephone Encounter (Signed)
Per [name]-try hydromet #274ml 1 tsp every 6 hours prn cough no refills, finish augmentin and take Prednisone 10mg  #20 take 4 x 2 days, 3 x 2 days,2 x 2 days, 1 x 2 days then stop no refills.

## 2011-02-22 NOTE — Telephone Encounter (Signed)
Pt states her cough has improved slightly, mucus is now yellow but also mixed with blood (bright red), sob worse with exertion, chest and back pain and wheezing. She says she feels terrible. Still taking the Augmentin. Pls advise. Allergies  Allergen Reactions  . Cephalexin   . Codeine   . Estradiol   . Hydrocortisone   . Moxifloxacin   . Sulfonamide Derivatives

## 2011-02-22 NOTE — Telephone Encounter (Signed)
Called and spoke with pt and informed her of CY's recs.  Pt states she already has some hydrocodone cough syrup at home that isn't expired that she will take for her cough.  Therefore only sent the Rx for Prednisone [facility] [address] and pt was also aware to finish the abx.

## 2011-02-24 ENCOUNTER — Encounter: Payer: Self-pay | Admitting: Internal Medicine

## 2011-02-24 NOTE — Assessment & Plan Note (Signed)
Repeated exacerbations that usually seem to  Begin as URI. Again this time nasal symptoms came first. She is so frail that any change is destabilizing. We will provide symptom care and an antibiotic.

## 2011-02-27 ENCOUNTER — Other Ambulatory Visit: Payer: Self-pay | Admitting: *Deleted

## 2011-02-27 MED ORDER — LEVALBUTEROL HCL 1.25 MG/3ML IN NEBU: 1.0000 | INHALATION_SOLUTION | Freq: Three times a day (TID) | RESPIRATORY_TRACT | Status: DC | PRN

## 2011-02-28 ENCOUNTER — Other Ambulatory Visit: Payer: Self-pay | Admitting: *Deleted

## 2011-02-28 ENCOUNTER — Ambulatory Visit (INDEPENDENT_AMBULATORY_CARE_PROVIDER_SITE_OTHER): Payer: Medicare Other | Admitting: *Deleted

## 2011-02-28 ENCOUNTER — Ambulatory Visit (INDEPENDENT_AMBULATORY_CARE_PROVIDER_SITE_OTHER): Payer: Medicare Other

## 2011-02-28 DIAGNOSIS — I4891 Unspecified atrial fibrillation: Secondary | ICD-10-CM

## 2011-02-28 DIAGNOSIS — J309 Allergic rhinitis, unspecified: Secondary | ICD-10-CM

## 2011-02-28 DIAGNOSIS — Z7901 Long term (current) use of anticoagulants: Secondary | ICD-10-CM

## 2011-02-28 DIAGNOSIS — E7800 Pure hypercholesterolemia, unspecified: Secondary | ICD-10-CM

## 2011-03-07 ENCOUNTER — Telehealth: Payer: Self-pay | Admitting: Internal Medicine

## 2011-03-07 MED ORDER — LEVALBUTEROL HCL 1.25 MG/3ML IN NEBU: 1.0000 | INHALATION_SOLUTION | Freq: Three times a day (TID) | RESPIRATORY_TRACT | Status: DC | PRN

## 2011-03-07 NOTE — Telephone Encounter (Signed)
Pt aware we will resend rx for Xopenex 1.25 mg nebulizer solution. She uses this medication 3 times daily and each vial is 3 mL's. This is a total of 270 mL's per month or 810 mL's for a 90 day supply. 90 day supply sent to [facility] per pt request. Pt will call if she has any further problems.

## 2011-03-07 NOTE — Telephone Encounter (Signed)
rx for xopenex has been faxed back to [facility] for refills.  Ok per [facility]

## 2011-03-09 ENCOUNTER — Encounter: Payer: Self-pay | Admitting: Nurse Practitioner

## 2011-03-09 ENCOUNTER — Encounter: Payer: Self-pay | Admitting: Internal Medicine

## 2011-03-09 ENCOUNTER — Ambulatory Visit (INDEPENDENT_AMBULATORY_CARE_PROVIDER_SITE_OTHER): Payer: Medicare Other

## 2011-03-09 DIAGNOSIS — J309 Allergic rhinitis, unspecified: Secondary | ICD-10-CM

## 2011-03-12 ENCOUNTER — Encounter: Payer: Self-pay | Admitting: Nurse Practitioner

## 2011-03-12 ENCOUNTER — Other Ambulatory Visit (INDEPENDENT_AMBULATORY_CARE_PROVIDER_SITE_OTHER): Payer: Medicare Other | Admitting: *Deleted

## 2011-03-12 ENCOUNTER — Other Ambulatory Visit (INDEPENDENT_AMBULATORY_CARE_PROVIDER_SITE_OTHER): Payer: Medicare Other | Admitting: Nurse Practitioner

## 2011-03-12 ENCOUNTER — Ambulatory Visit (INDEPENDENT_AMBULATORY_CARE_PROVIDER_SITE_OTHER): Payer: Medicare Other | Admitting: *Deleted

## 2011-03-12 ENCOUNTER — Ambulatory Visit (INDEPENDENT_AMBULATORY_CARE_PROVIDER_SITE_OTHER): Payer: Medicare Other | Admitting: Nurse Practitioner

## 2011-03-12 DIAGNOSIS — R0789 Other chest pain: Secondary | ICD-10-CM

## 2011-03-12 DIAGNOSIS — R0989 Other specified symptoms and signs involving the circulatory and respiratory systems: Secondary | ICD-10-CM

## 2011-03-12 DIAGNOSIS — J4489 Other specified chronic obstructive pulmonary disease: Secondary | ICD-10-CM

## 2011-03-12 DIAGNOSIS — I4891 Unspecified atrial fibrillation: Secondary | ICD-10-CM

## 2011-03-12 DIAGNOSIS — I350 Nonrheumatic aortic (valve) stenosis: Secondary | ICD-10-CM | POA: Insufficient documentation

## 2011-03-12 DIAGNOSIS — R0600 Dyspnea, unspecified: Secondary | ICD-10-CM

## 2011-03-12 DIAGNOSIS — R0609 Other forms of dyspnea: Secondary | ICD-10-CM

## 2011-03-12 DIAGNOSIS — Z7901 Long term (current) use of anticoagulants: Secondary | ICD-10-CM

## 2011-03-12 DIAGNOSIS — E7800 Pure hypercholesterolemia, unspecified: Secondary | ICD-10-CM

## 2011-03-12 DIAGNOSIS — J449 Chronic obstructive pulmonary disease, unspecified: Secondary | ICD-10-CM

## 2011-03-12 DIAGNOSIS — I359 Nonrheumatic aortic valve disorder, unspecified: Secondary | ICD-10-CM

## 2011-03-12 DIAGNOSIS — Z950 Presence of cardiac pacemaker: Secondary | ICD-10-CM

## 2011-03-12 LAB — LIPID PANEL
Cholesterol: 235 mg/dL — ABNORMAL HIGH (ref 0–200)
HDL: 57.1 mg/dL (ref >39.00)
Total CHOL/HDL Ratio: 4
Triglycerides: 113 mg/dL (ref 0.0–149.0)
VLDL: 22.6 mg/dL (ref 0.0–40.0)

## 2011-03-12 LAB — BASIC METABOLIC PANEL
BUN: 17 mg/dL (ref 6–23)
CO2: 28 mEq/L (ref 19–32)
Calcium: 8.9 mg/dL (ref 8.4–10.5)
Chloride: 100 mEq/L (ref 96–112)
Creatinine, Ser: 0.8 mg/dL (ref 0.4–1.2)
GFR: 76.95 mL/min (ref >60.00)
Glucose, Bld: 103 mg/dL — ABNORMAL HIGH (ref 70–99)
Potassium: 4 mEq/L (ref 3.5–5.1)
Sodium: 137 mEq/L (ref 135–145)

## 2011-03-12 LAB — HEPATIC FUNCTION PANEL
ALT: 15 U/L (ref 0–35)
AST: 21 U/L (ref 0–37)
Albumin: 3.6 g/dL (ref 3.5–5.2)
Alkaline Phosphatase: 50 U/L (ref 39–117)
Bilirubin, Direct: 0.1 mg/dL (ref 0.0–0.3)
Total Bilirubin: 0.5 mg/dL (ref 0.3–1.2)
Total Protein: 6.4 g/dL (ref 6.0–8.3)

## 2011-03-12 LAB — LDL CHOLESTEROL, DIRECT: Direct LDL: 159.2 mg/dL

## 2011-03-12 LAB — BRAIN NATRIURETIC PEPTIDE: Pro B Natriuretic peptide (BNP): 181 pg/mL — ABNORMAL HIGH (ref 0.0–100.0)

## 2011-03-12 NOTE — Assessment & Plan Note (Signed)
She seems like she was doing better with taking the Nexium two times a day. We will see what her labs look like. She is not having any exertional symptoms. I will have her see Dr. Graciela Husbands for follow up with Dr. Deborah Chalk retiring in about 3 months. I encouraged her to try and stay active. Patient is agreeable to this plan and will call if any problems develop in the interim.

## 2011-03-12 NOTE — Assessment & Plan Note (Signed)
She remains on oxygen therapy. She has had 2 recent bouts of bronchitis managed with antibiotics and prednisone.

## 2011-03-12 NOTE — Patient Instructions (Signed)
We are going to check your labs today. Stay on your current medicines. I will have you see Dr. Graciela Husbands for follow up in about 3 to 4 months. Call for any problems.

## 2011-03-12 NOTE — Assessment & Plan Note (Signed)
She remains in sinus on Betapace. We will be checking a digoxin level today.

## 2011-03-12 NOTE — Assessment & Plan Note (Signed)
She will be in the device clinic tomorrow for pacemaker check.

## 2011-03-12 NOTE — Progress Notes (Signed)
[name] Date of Birth: June 03, 1924   History of Present Illness: [name] is seen today for her 3 month visit. She is seen for Dr. Deborah Chalk. Overall, she is holding her own. She has had two bouts of bronchitis and has just finished another round of prednisone and antibiotics. She remains short of breath. She continues to have this upper abdominal discomfort. She is back on just one Nexium per day. Her LFTs were slightly elevated in January. She remains off of her statin therapy. She thinks her rhythm has been ok. She sees Dr. Graciela Husbands tomorrow for her pacemaker check. She is upset about Dr. Ronnald Nian retirement.  Current Outpatient Prescriptions on File Prior to Visit  Medication Sig Dispense Refill  . Alum & Mag Hydroxide-Simeth (MAGIC MOUTHWASH) SOLN Take 5 mLs by mouth 4 (four) times daily as needed. 1 tsp swish and swallow four times a day       . chlorpheniramine-HYDROcodone (TUSSIONEX PENNKINETIC ER) 10-8 MG/5ML LQCR Take 5 mLs by mouth every 12 (twelve) hours as needed.        . digoxin (LANOXIN) 0.125 MG tablet Take 125 mcg by mouth daily.        Marland Kitchen diltiazem (CARDIZEM LA) 240 MG 24 hr tablet Take 240 mg by mouth daily.        . diphenhydrAMINE (CHILDRENS ALLERGY) 12.5 MG/5ML liquid Take 12.5 mg by mouth as directed.        [name] Calcium (STOOL SOFTENER PO) Take by mouth daily.        Marland Kitchen esomeprazole (NEXIUM) 40 MG capsule Take 40 mg by mouth daily before breakfast.        . ezetimibe (ZETIA) 10 MG tablet Take 10 mg by mouth daily.        . furosemide (LASIX) 40 MG tablet Take 20 mg by mouth 2 (two) times daily. Take 1/2 of 40 mg twice daily       . Guaifenesin (MUCINEX MAXIMUM STRENGTH) 1200 MG TB12 Take 1,200 mg by mouth 2 (two) times daily at 10 AM and 5 PM.        . levalbuterol (XOPENEX HFA) 45 MCG/ACT inhaler Inhale 2 puffs into the lungs every 6 (six) hours as needed.        . levalbuterol (XOPENEX) 1.25 MG/3ML nebulizer solution Take 3 mLs (1.25 mg total) by nebulization  every 8 (eight) hours as needed. DX:  493.20  810 mL  3  . Mometasone Furo-Formoterol Fum (DULERA) 200-5 MCG/ACT AERO Inhale 2 puffs into the lungs 2 (two) times daily. Prn        . OXYGEN-HELIUM IN Inhale into the lungs. 3 liters 24/7       . potassium chloride (KLOR-CON) 20 MEQ packet Take 20 mEq by mouth 2 (two) times daily.        . sertraline (ZOLOFT) 50 MG tablet Take 50 mg by mouth daily.        Marland Kitchen SINGULAIR 10 MG tablet TAKE 1 TABLET DAILY FOR ASTHMA  30 tablet  2  . sodium chloride ([facility] SALINE NASAL SPRAY) 0.65 % nasal spray 1 spray by Nasal route as needed.        . sotalol (BETAPACE) 80 MG tablet Take 80 mg by mouth 2 (two) times daily.        Marland Kitchen Spacer/Aero-Holding Chambers (AEROCHAMBER Z-STAT PLUS CHAMBR) MISC by Does not apply route. Use with inhaler as directed       . tiotropium (SPIRIVA HANDIHALER) 18 MCG inhalation capsule  Place 18 mcg into inhaler and inhale daily.        Marland Kitchen warfarin (COUMADIN) 1 MG tablet Take 1 mg by mouth as directed.        . Ascorbic Acid (VITAMIN C) 500 MG tablet Take 500 mg by mouth daily.        . Coenzyme Q10 (COQ10) 100 MG CAPS Take 100 mg by mouth daily.        . hyoscyamine (LEVSIN) 0.125 MG tablet Take 0.125 mg by mouth every 6 (six) hours as needed. Sublingual       . hyoscyamine (LEVSIN/SL) 0.125 MG SL tablet Place 0.125 mg under the tongue every 6 (six) hours as needed.        . Multiple Minerals-Vitamins (CALCIUM CITRATE +) TABS Take by mouth.        . Multiple Vitamin (MULTIVITAMINS PO) Take by mouth. 1 by mouth daily       . predniSONE (DELTASONE) 10 MG tablet Take 10 mg by mouth daily. 1-2 daily (Use sparingly)       . predniSONE (DELTASONE) 10 MG tablet Take 4 tabs daily x 2 days, then 3 tabs daily x 2 days, then 2 tabs daily x 2 days, then 1 tab daily x 2 days, then stop.  20 tablet  0  . vitamin B-12 (CYANOCOBALAMIN) 100 MCG tablet Take 100 mcg by mouth daily.          Allergies  Allergen Reactions  . Cephalexin   . Codeine   .  Estradiol   . Hydrocortisone   . Moxifloxacin   . Sulfonamide Derivatives     Past Medical History  Diagnosis Date  . Asthmatic bronchitis   . Allergic rhinitis   . Atrial fibrillation   . History of aortic valve replacement Dr. Tyrone Sage 2006    Has severe residual gradient and will be managed conservatively  . Sleep apnea   . Chronic obstructive asthma   . Osteoporosis   . Chronic fatigue   . Pruritic disorder   . Diverticulosis   . Hemorrhoids   . GERD (gastroesophageal reflux disease)   . IBS (irritable bowel syndrome)   . Pacemaker 2010  . Oxygen dependent   . Atrial flutter     on Betapace  . High risk medication use     on Betapace & Coumadin  . Chronic anticoagulation     Past Surgical History  Procedure Date  . Back surgery 1994  . Tonsillectomy   . Appendectomy   . Ganglion cyst excision   . Vocal cord polyps 1975 and 1984  . Aortic valve replacement 2006  . Pacemaker insertion 2010  . Cardiac electrophysiology mapping and ablation     History  Smoking status  . Former Smoker -- 1.0 packs/day for 50 years  . Types: Cigarettes  . Quit date: 11/05/1993  Smokeless tobacco  . Never Used    History  Alcohol Use No    Family History  Problem Relation Age of Onset  . Kidney failure Father   . Colon cancer Paternal Aunt   . Heart disease Maternal Grandfather   . Heart attack Paternal Grandfather   . Breast cancer Daughter   . Stroke Maternal Grandmother   . Heart disease Mother     Review of Systems: The review of systems is positive for dizziness with bending over. Sometimes she sees "little sparkles" with bending over. She has not had syncope. She does not have chest pain. Her prosthetic aortic valve  has a gradient of 1 and she is managed conservatively. She seems to be adjusting to living alone. Her husband died back in 09-28-2023.  All other systems were reviewed and are negative.  Physical Exam: BP 130/60  Pulse 60  Wt 127 lb (57.607  kg) Patient is very pleasant and in no acute distress. She has oxygen in place. Skin is warm and dry. Color is normal.  HEENT is unremarkable. Normocephalic/atraumatic. PERRL. Sclera are nonicteric. Neck is supple. No masses. No JVD. Lungs are fairly clear with decreased breath sounds. Cardiac exam shows a regular rate and rhythm. She has a harsh outflow murmur of AS noted. Abdomen is soft. Extremities are without edema. Gait and ROM are intact. No gross neurologic deficits noted.  LABORATORY DATA: Pending  Assessment / Plan:

## 2011-03-13 ENCOUNTER — Ambulatory Visit (INDEPENDENT_AMBULATORY_CARE_PROVIDER_SITE_OTHER): Payer: Medicare Other | Admitting: Internal Medicine

## 2011-03-13 ENCOUNTER — Ambulatory Visit (INDEPENDENT_AMBULATORY_CARE_PROVIDER_SITE_OTHER): Payer: Medicare Other

## 2011-03-13 ENCOUNTER — Encounter: Payer: Self-pay | Admitting: Internal Medicine

## 2011-03-13 DIAGNOSIS — I498 Other specified cardiac arrhythmias: Secondary | ICD-10-CM

## 2011-03-13 DIAGNOSIS — Z950 Presence of cardiac pacemaker: Secondary | ICD-10-CM

## 2011-03-13 DIAGNOSIS — I4891 Unspecified atrial fibrillation: Secondary | ICD-10-CM

## 2011-03-13 DIAGNOSIS — R5381 Other malaise: Secondary | ICD-10-CM

## 2011-03-13 DIAGNOSIS — I350 Nonrheumatic aortic (valve) stenosis: Secondary | ICD-10-CM

## 2011-03-13 DIAGNOSIS — I359 Nonrheumatic aortic valve disorder, unspecified: Secondary | ICD-10-CM

## 2011-03-13 DIAGNOSIS — J309 Allergic rhinitis, unspecified: Secondary | ICD-10-CM

## 2011-03-13 DIAGNOSIS — R5383 Other fatigue: Secondary | ICD-10-CM

## 2011-03-13 LAB — DIGOXIN LEVEL: Digoxin Level: 0.9 ng/mL (ref 0.8–2.0)

## 2011-03-13 NOTE — Assessment & Plan Note (Signed)
Intermittent and interestingly relatively brief episodes and some termination of which was facilitated by antitachycardia pacing via her pacemaker

## 2011-03-13 NOTE — Assessment & Plan Note (Signed)
Trying to understand whether there is in new echo that identifies severe aortic stenosis

## 2011-03-13 NOTE — Assessment & Plan Note (Signed)
I wonder whether some of the medications may be contributing to her fatigue. As she has scant atrial fibrillation and her only function is normal we will stop the digoxin. I've also asked her to hold the Cardizem as her blood pressures normal. We'll see if this has any impact on this fatigue

## 2011-03-13 NOTE — Assessment & Plan Note (Signed)
The patient's device was interrogated.  The information was reviewed. No changes were made in the programming.    

## 2011-03-13 NOTE — Patient Instructions (Signed)
Your physician has recommended you make the following change in your medication:  1) Stop Digoxin. 2) Hold diltiazem for 2 weeks and then call and let us know how you are feeling.  Your physician wants you to follow-up in: 1 year. You will receive a reminder letter in the mail two months in advance. If you don't receive a letter, please call our office to schedule the follow-up appointment.

## 2011-03-13 NOTE — Progress Notes (Signed)
HPI  [name] is a 77 y.o. female  Seen after a long hiatus. Many years ago she underwent catheter ablation of an atypical atrial flutter following a previous chemotherapy tricuspid isthmus flutter ablation. She did develop  atrial fibrillation. Because of   significant pausingBecause of this she underwent. Pacemaker implantation in 2010. This was done with an atrial therapies and the patient has felt great from a rhythm point of view.  The patient has significant fatigue. She has chronic shortness of breath and recurrent bronchitis for which she is being treated by Dr. Maple Hudson. She has been taking steroids and antibiotics  She is described as having severe and mild aortic stenosis. Past Medical History  Diagnosis Date  . Asthmatic bronchitis   . Allergic rhinitis   . Atrial fibrillation   . History of aortic valve replacement Dr. Tyrone Sage 2006    Has severe residual gradient and will be managed conservatively  . Sleep apnea   . Chronic obstructive asthma   . Osteoporosis   . Chronic fatigue   . Pruritic disorder   . Diverticulosis   . Hemorrhoids   . GERD (gastroesophageal reflux disease)   . IBS (irritable bowel syndrome)   . Pacemaker 2010  . Oxygen dependent   . Atrial flutter     on Betapace  . High risk medication use     on Betapace & Coumadin  . Chronic anticoagulation     Past Surgical History  Procedure Date  . Back surgery 1994  . Tonsillectomy   . Appendectomy   . Ganglion cyst excision   . Vocal cord polyps 1975 and 1984  . Aortic valve replacement 2006  . Pacemaker insertion 2010  . Cardiac electrophysiology mapping and ablation     Current Outpatient Prescriptions  Medication Sig Dispense Refill  . Alum & Mag Hydroxide-Simeth (MAGIC MOUTHWASH) SOLN Take 5 mLs by mouth 4 (four) times daily as needed. 1 tsp swish and swallow four times a day       . chlorpheniramine-HYDROcodone (TUSSIONEX PENNKINETIC ER) 10-8 MG/5ML LQCR Take 5 mLs by mouth every  12 (twelve) hours as needed.        . digoxin (LANOXIN) 0.125 MG tablet Take 125 mcg by mouth daily.        Marland Kitchen diltiazem (CARDIZEM LA) 240 MG 24 hr tablet Take 240 mg by mouth daily.        . diphenhydrAMINE (CHILDRENS ALLERGY) 12.5 MG/5ML liquid Take 12.5 mg by mouth as directed.        [name] Calcium (STOOL SOFTENER PO) Take by mouth daily.        Marland Kitchen esomeprazole (NEXIUM) 40 MG capsule Take 40 mg by mouth daily before breakfast.        . ezetimibe (ZETIA) 10 MG tablet Take 10 mg by mouth daily.        . furosemide (LASIX) 40 MG tablet Take 20 mg by mouth 2 (two) times daily. Take 1/2 of 40 mg twice daily       . Guaifenesin (MUCINEX MAXIMUM STRENGTH) 1200 MG TB12 Take 1,200 mg by mouth 2 (two) times daily at 10 AM and 5 PM.        . levalbuterol (XOPENEX HFA) 45 MCG/ACT inhaler Inhale 2 puffs into the lungs every 6 (six) hours as needed.        . levalbuterol (XOPENEX) 1.25 MG/3ML nebulizer solution Take 3 mLs (1.25 mg total) by nebulization every 8 (eight) hours as needed. DX:  493.20  810 mL  3  . Mometasone Furo-Formoterol Fum (DULERA) 200-5 MCG/ACT AERO Inhale 2 puffs into the lungs 2 (two) times daily. Prn        . OXYGEN-HELIUM IN Inhale into the lungs. 3 liters 24/7       . potassium chloride (KLOR-CON) 20 MEQ packet Take 20 mEq by mouth 2 (two) times daily.        . sertraline (ZOLOFT) 50 MG tablet Take 50 mg by mouth daily.        Marland Kitchen SINGULAIR 10 MG tablet TAKE 1 TABLET DAILY FOR ASTHMA  30 tablet  2  . sodium chloride ([facility] SALINE NASAL SPRAY) 0.65 % nasal spray 1 spray by Nasal route as needed.        . sotalol (BETAPACE) 80 MG tablet Take 80 mg by mouth 2 (two) times daily.        Marland Kitchen Spacer/Aero-Holding Chambers (AEROCHAMBER Z-STAT PLUS CHAMBR) MISC by Does not apply route. Use with inhaler as directed       . tiotropium (SPIRIVA HANDIHALER) 18 MCG inhalation capsule Place 18 mcg into inhaler and inhale daily.        Marland Kitchen warfarin (COUMADIN) 1 MG tablet Take 1 mg by mouth as directed.         . Ascorbic Acid (VITAMIN C) 500 MG tablet Take 500 mg by mouth daily.        . Coenzyme Q10 (COQ10) 100 MG CAPS Take 100 mg by mouth daily.        . hyoscyamine (LEVSIN) 0.125 MG tablet Take 0.125 mg by mouth every 6 (six) hours as needed. Sublingual       . hyoscyamine (LEVSIN/SL) 0.125 MG SL tablet Place 0.125 mg under the tongue every 6 (six) hours as needed.        . Multiple Minerals-Vitamins (CALCIUM CITRATE +) TABS Take by mouth.        . Multiple Vitamin (MULTIVITAMINS PO) Take by mouth. 1 by mouth daily       . predniSONE (DELTASONE) 10 MG tablet Take 10 mg by mouth daily. 1-2 daily (Use sparingly)       . predniSONE (DELTASONE) 10 MG tablet Take 4 tabs daily x 2 days, then 3 tabs daily x 2 days, then 2 tabs daily x 2 days, then 1 tab daily x 2 days, then stop.  20 tablet  0  . vitamin B-12 (CYANOCOBALAMIN) 100 MCG tablet Take 100 mcg by mouth daily.          Allergies  Allergen Reactions  . Cephalexin   . Codeine   . Estradiol   . Hydrocortisone   . Moxifloxacin   . Sulfonamide Derivatives     Review of Systems negative except from HPI and PMH  Physical Exam Well developed and well nourished in no acute distress wearing oxygen HENT normal E scleral and icterus clear Neck Supple JVP flat; carotids brisk and full Clear to ausculation Regular rate and rhythm, 2/6 systolic murmur at the right upper sternal border Soft with active bowel sounds No clubbing cyanosis Trace edema Alert and oriented, grossly normal motor and sensory function Skin Warm and Dry   Assessment and  Plan

## 2011-03-14 ENCOUNTER — Telehealth: Payer: Self-pay | Admitting: Internal Medicine

## 2011-03-14 ENCOUNTER — Telehealth: Payer: Self-pay | Admitting: *Deleted

## 2011-03-14 NOTE — Telephone Encounter (Signed)
Pt wants to discuss medication. 

## 2011-03-14 NOTE — Telephone Encounter (Signed)
Message copied by Barnetta Hammersmith on Wed Mar 14, 2011  1:02 PM ------      Message from: Norma Fredrickson      Created: Wed Mar 14, 2011 10:24 AM       Ok to report. Labs are satisfactory. Liver tests are back to normal. LDL is not ideal, but lets just try diet for now. Recheck on return visit.

## 2011-03-14 NOTE — Telephone Encounter (Signed)
Message copied by Barnetta Hammersmith on Wed Mar 14, 2011  2:11 PM ------      Message from: Norma Fredrickson      Created: Wed Mar 14, 2011 10:24 AM       Ok to report. Labs are satisfactory. Liver tests are back to normal. LDL is not ideal, but lets just try diet for now. Recheck on return visit.

## 2011-03-14 NOTE — Telephone Encounter (Signed)
Pt notified of lab results and will continue to watch diet.

## 2011-03-20 NOTE — Assessment & Plan Note (Signed)
[facility]                             PULMONARY OFFICE NOTE   NAME:WHITCOMBArella, Blinder                     MRN:          [id number]  DATE:03/28/2007                            DOB:          24-Sep-1924    PROBLEM:  1. Allergic rhinitis.  2. Asthma with COPD.  3. Mild obstructive sleep apnea (14 per hour).   HISTORY:  She returns off of antihistamines for allergy testing, which  she wanted done.  She says the Xyzal caused her to oversleep, but  without it she is having increased nasal congestion, and she feels more  congested in her chest.  She had quit smoking in 1995.  We reviewed her  medication list with her again, and it is charted.   OBJECTIVE:  VITAL SIGNS:  Weight 138 pounds.  BP 104/56, pulse 70,  oxygen saturation 95% on 2-1/2 liters, dry cough.  HEART:  Sounds irregular with a grade 1/6 systolic murmur.  HEENT:  His mucosa is atrophic with some mucus crusting anteriorly.  Conjunctivae are not injected.  SKIN TESTS:  Positive histamine and negative diluent controls.  There is  significant intradermal tests, particularly for grass and tree pollens  and for dust mite.  Reactions are stronger than I might have expected  for her age, and she thinks this pattern is similar to the reaction she  had in skin testing with Dr. Stevphen Rochester years ago.   IMPRESSION:  Allergic component to rhinitis and asthma, with a  background of significant tobacco-related chronic obstructive pulmonary  disease.  She does continue with pulmonary rehabilitation and is off  cigarettes.  There are significant other medical problems as charted.   PLAN:  1. Environmental precautions were emphasized and print information      given for dust, mold and pollen avoidance.  2. Continue Singulair.  3. Try Claritin as an antihistamine that would be non-sedating.  4. Schedule return for followup, looking at options, and because I      want to see how she is doing at that  point by the end of the peak      spring pollen season.     Clinton D. Maple Hudson, MD, Tonny Bollman, FACP  Electronically Signed    [name]/MedQ  DD: 03/30/2007  DT: 03/30/2007  Job #: [id number]   cc:   Titus Dubin. Alwyn Ren, MD,FACP,FCCP

## 2011-03-20 NOTE — Procedures (Signed)
CAROTID DUPLEX EXAM   INDICATION:  Follow-up evaluation of carotid artery disease.  Patient  has a severe headache which started this morning at 3 a.m.   HISTORY:  Diabetes:  No.  Cardiac:  History of aortic stenosis.  Patient currently has a porcine  valve.  Hypertension:  No.  Smoking:  Former smoker.  Previous Surgery:  Aortic valve replacement February 23, 2005.  CV History:  Previous duplex on October 20, 2007, revealed 20% to 39%  ICA stenosis bilaterally with distal tortuosity.  Amaurosis Fugax No, Paresthesias No, Hemiparesis No.                                       RIGHT             LEFT  Brachial systolic pressure:         136               138  Brachial Doppler waveforms:         Triphasic         Triphasic  Vertebral direction of flow:        Antegrade         Antegrade  DUPLEX VELOCITIES (cm/sec)  CCA peak systolic                   98                84  ECA peak systolic                   103               76  ICA peak systolic                   111               56  ICA end diastolic                   26                12  PLAQUE MORPHOLOGY:                  Mixed to regular  None  PLAQUE AMOUNT:                      Mild to moderate  None  PLAQUE LOCATION:                    Proximal ICA      None   IMPRESSION:  1. A 40% to 59% right ICA stenosis.  2. No left ICA stenosis.  3. ICAs are tortuous distally.   ___________________________________________  P. Liliane Bade, M.D.   MC/MEDQ  D:  12/28/2008  T:  12/28/2008  Job:  [id number]

## 2011-03-20 NOTE — Consult Note (Signed)
[name], YEATER NO.:  [id number]   MEDICAL RECORD NO.:  [id number]          PATIENT TYPE:  INP   LOCATION:  3730                         FACILITY:  [facility]   PHYSICIAN:  Cassell Clement, M.D. DATE OF BIRTH:  02-21-1924   DATE OF CONSULTATION:  03/11/2009  DATE OF DISCHARGE:                                 CONSULTATION   We were asked to see this pleasant 77 year old Caucasian female by Dr.  Jetty Duhamel in regard to dyspnea and possible congestive heart  failure.  She is well-known to Dr. Deborah Chalk.  She was admitted on the  Pulmonary Service on Mar 09, 2009, because of worsening dyspnea.  She got  to the point that she had to call the ambulance and insisted on being  taken to [facility].  Chest x-ray on admission showed signs of  congestive heart failure and followup chest x-ray showed improvement in  pattern of CHF.  Interestingly, her B-natriuretic peptide on admission  was only 83.  However, the patient did respond to Lasix 40 mg given  intravenously x1 on the night of admission.  The patient has an  interesting past history.  She has had a history of previous aortic  stenosis and underwent aortic valve replacement with porcine valve by  Dr. Tyrone Sage in April 2006.  She did not have any coronary disease.  She  had atrial flutter, which persisted following surgery and had failed 2  episodes of attempted outpatient cardioversion.  On September 18, 2006,  she underwent invasive electrophysiologic study with arrhythmia mapping  and electroanatomical mapping and catheter ablation x2 for an atypical  atrial flutter.  Postoperatively, however she has continued to have  problems with atrial flutter/fibrillation and has been on long-term  Coumadin.  Her electrocardiogram on admission this time showed atrial  flutter, but today's telemetry shows that she appears to be in normal  sinus rhythm with a first-degree AV block.  However, we will get a 12-  lead EKG to  confirm the rhythm.  The patient had a recent two-  dimensional echocardiogram done in Dr. Ronnald Nian office on February 24, 2009.  It showed prosthetic aortic valve with residual severe gradient.  Her peak instantaneous gradient was 68 mmHg and her calculated aortic  valve area was 1.0 cm2.  She was also found to have mild pulmonary  hypertension with right ventricular systolic pressure of 38 and she had  normal left ventricular systolic function with an ejection fraction of  55-60%.  She was in atrial fibrillation at the time of the echo.  She  was also found to have mild mitral regurgitation and mild tricuspid  regurgitation.  A copy of the echo is being placed in her hospital  chart.   Her present medications in hospital include Atrovent inhaler and  Ventolin inhaler as well as IV steroids.   Cardiac meds include digoxin 0.125 mg daily, Cardizem CD 240 mg daily,  and warfarin by pharmacy protocol.   PAST MEDICAL HISTORY:  Positive for mild sleep apnea and osteoporosis as  well as chronic fatigue and a pleuritic disorder.  FAMILY HISTORY:  Father died of renal failure.  Mother had coronary  disease and CABG.   The social history reveals that she quit smoking in 1995.   All other systems negative in detail.   PHYSICAL EXAMINATION:  VITAL SIGNS:  On examination, her blood pressure  is 121/55, pulse is 90 in normal sinus rhythm, O2 sat at 94% on oxygen.  HEENT:  The pupils are equal and reactive.  Sclerae nonicteric.  Mouth  and pharynx normal.  NECK:  Jugular venous pressure slightly elevated.  Carotids have a  normal upstroke.  There are bilateral bruits in the carotids transmitted  from the heart.  CHEST:  Diminished breath sounds and decreased air movement.  HEART:  Grade 2/6 harsh systolic ejection murmur at the base.  There is  no S3 gallop, no rub.  ABDOMEN:  Nontender.  The abdomen is slightly distended.  There are  active bowel sounds.  The liver and spleen are not  enlarged.  EXTREMITIES:  Mild superficial varicosities.  There is no peripheral  edema.  She has 1+ pedal pulses.  NEUROLOGIC:  Nonlateralizing.   IMPRESSION:  1. Severe chronic obstructive pulmonary disease and asthma.  2. History of chronic atrial flutter now back in apparent normal sinus      rhythm with first-degree atrioventricular block.  3. Status post aortic valve replacement in 2006 with severe residual      aortic systolic gradient.  4. Improving congestive heart failure.  Of note is the fact that her B-      natriuretic peptide on admission was only 83, but chest x-ray on      admission did show signs of congestive heart failure, which have      improved radiographically after receiving one dose of IV Lasix on      admission.   RECOMMENDATIONS:  I agree with current management as outlined by Dr.  Jetty Duhamel.  We will continue trial of low dose diuretic.  We noted  her kidney function is normal.  We will give potassium supplementation.   We will follow with you.           ______________________________  Cassell Clement, M.D.     TB/MEDQ  D:  03/11/2009  T:  03/12/2009  Job:  [id number]   cc:   Joni Fears D. Maple Hudson, MD, FCCP, FACP  Colleen Can. Deborah Chalk, M.D.

## 2011-03-20 NOTE — Procedures (Signed)
CAROTID DUPLEX EXAM   INDICATION:  Follow-up evaluation of known carotid artery disease.   HISTORY:  Diabetes:  No.  Cardiac:  Aortic stenosis, arrhythmias.  Hypertension:  No.  Smoking:  Quit in 1995.  Previous Surgery:  No.  CV History:  Patient complains of right-sided headache, weekly 15 minute  episodes of visual disturbances and dizziness.  Previous duplex  performed on February 15, 2005 revealed a 40-59% ICA stenosis bilaterally  secondary to vessel tortuosity.  Amaurosis Fugax No, Paresthesias No, Hemiparesis No                                       RIGHT             LEFT  Brachial systolic pressure:         140               140  Brachial Doppler waveforms:         Triphasic         Triphasic  Vertebral direction of flow:        Antegrade         Antegrade  DUPLEX VELOCITIES (cm/sec)  CCA peak systolic                   108               86  ECA peak systolic                   94                74  ICA peak systolic                   93                52  ICA end diastolic                   23                20  PLAQUE MORPHOLOGY:                  Calcified         Calcified  PLAQUE AMOUNT:                      Moderate          Moderate  PLAQUE LOCATION:                    Proximal ICA      Proximal ICA   IMPRESSION:  1. Distal right internal carotid artery 113 cm/s peak systolic      velocity, 24 cm/s end diastolic velocity.  2. Distal left internal carotid artery 171 cm/s peak systolic      velocity, 32 cm/s end diastolic velocity.  3. 20-39% internal carotid artery stenosis bilaterally.  4. Elevated peak systolic velocities in the distal internal carotid      arteries are secondary to vessel tortuosity bilaterally.  5. No significant change from previous study performed on February 15, 2005.   ___________________________________________  Janetta Hora. Fields, MD   MC/MEDQ  D:  10/20/2007  T:  10/21/2007  Job:  [id number]

## 2011-03-20 NOTE — Assessment & Plan Note (Signed)
[name]                             PULMONARY OFFICE NOTE   NAME:WHITCOMBDede, Dobesh                     MRN:          [id number]  DATE:05/12/2007                            DOB:          [birth year]    PROBLEM:  1. Allergic rhinitis.  2. Asthma with chronic obstructive pulmonary disease.  3. Mild obstructive sleep apnea.  4. Aortic valve replacement/atrial fibrillation/Coumadin.   HISTORY:  She has been staying indoors, avoiding the seasonal air  quality and trying to care for her husband who has been ill.  Says she  has had urticaria off-and-on after her bath for about 6 months.  It  seems to be heat related.  She has tried several different soaps and  recently has been doing better.  Sputum is yellow when she is not on an  antibiotic.  She is not using CPAP which she gave up on as  uncomfortable.  We talked about this for her at age 77, as perhaps being  less demanding of a medical issue than if she were younger.  We reviewed  her medication list.   OBJECTIVE:  Weight 139 pounds, BP 102/52, pulse 64, oxygen saturation  95% on 2.5 L.  There is a grade 1/6 systolic murmur at the upper sternal border.  No edema.   IMPRESSION:  1. Chronic obstructive pulmonary disease.  2. Allergic rhinitis.  3. Chronic bronchitis.  4. Obstructive sleep apnea with patient declining continuous positive      airway pressure therapy.   PLAN:  1. Continue pulmonary rehabilitation.  2. We discussed options and suggested she may be better off putting up      with mild chronic bronchitis rather than staying on repeated doses      of antibiotics. We know she has an allergic component based on      previous skin testing and we have discussed environmental      precautions.  She is already using an inhaled steroid (Symbicort),      Singulair and Spiriva. Consider sputum culture if this persists.  3. Schedule return in 3 months, earlier p.r.n.     Clinton D.  Maple Hudson, MD, Tonny Bollman, FACP  Electronically Signed   [name]/MedQ  DD: 05/14/2007  DT: 05/15/2007  Job #: [id number]   cc:   Titus Dubin. Alwyn Ren, MD,FACP,FCCP

## 2011-03-20 NOTE — Discharge Summary (Signed)
[name], [name]              ACCOUNT NO.:  [id number]   MEDICAL RECORD NO.:  [id number]          PATIENT TYPE:  INP   LOCATION:  3730                         FACILITY:  [facility]   PHYSICIAN:  Clinton D. Maple Hudson, MD, FCCP, FACPDATE OF BIRTH:  12-02-25   DATE OF ADMISSION:  03/09/2009  DATE OF DISCHARGE:  03/19/2009                               DISCHARGE SUMMARY   DISCHARGE DIAGNOSES:  1. Chronic obstructive pulmonary disease with acute exacerbation.  2. Unspecified diastolic heart failure.  3. Chronic atrial fibrillation/flutter.  4. Thrush.  5. Urinary tract infection, Staphylococcus.  6. Respiratory failure.  7. Hyperlipidemia.  8. First-degree atrioventricular block.  9. Chronic cor pulmonale.  10.Osteoporosis.  11.Congestive heart failure, left ventricular.  12.Constipation.   BRIEF HISTORY:  An 77 year old white female former smoker admitted to  the emergency room with progressive persistent dyspnea beginning around  mid February.  She had had multiple office visits and had been treated  with steroids and antibiotics with insufficient response.  CT scan in  March had shown moderate emphysema with ground-glass infiltrates,  possibly hemorrhage by the time when she reported some hemoptysis.  Pulmonary function tests previously had shown at least moderate COPD  with severely reduced diffusion capacity.  She had a known history of  COPD, chronic atrial fibrillation, and aortic valve disease with valve  replacement.  In April, she was intermittently desaturating to the 70%  range on supplemental oxygen.  She presented to the emergency room on  the day of admission reporting increased dyspnea over the past 2 weeks  with dyspnea at rest, some bloody nasal discharge, thickly congested  cough, and also complaints of abdominal bloating and mild leg swelling.  Review of systems was negative for fever, exertional chest pain,  orthopnea.  She was felt to have a multifactorial  exacerbation of COPD  including an acute tracheobronchitis.  She was begun initially on  Zithromax and Zosyn by pharmacy protocol because of an extensive history  of medication intolerance.  Dyspnea responded to gentle diuresis with  some improvement.  The CT scan had previously shown marked emphysema and  this was reviewed with her.  A KUB demonstrated her abdominal distention  to be primarily obstipation with stool, but also demonstrated  degenerative changes in the spine.  Irregular heart rhythm consistent  with known atrial fibrillation/flutter and cardiac murmur of known  aortic stenosis after heart valve replacement were discussed with her.  Cardiology consultation was obtained with Dr. Patty Sermons for Dr. Deborah Chalk.  Her past history of ablation in 2007 was reviewed.  Office  echocardiogram in April had shown severe residual gradient through her  prosthetic aortic valve.  Neck veins were distended.  Ankle edema from  admission resolved fairly quickly with bedrest and diuresis.  Cardiology  recommended consideration of permanent pacemaker.  During her care, she  was noted to have a methicillin sensitive Staph aureus in her urine  which was covered initially with vancomycin and she was noted to have  thrush which was treated with Diflucan.  Her INR was supratherapeutic,  and with pharmacy management, her warfarin was held.  She was found to  have some blood in stools and we reviewed past experience that she had  been evaluated by GI, but at that time had been considered to frail to  tolerate colonoscopy.  As her hemoglobin remained stable, decision was  made to assess for further evaluation of rectal bleeding as an  outpatient.  Meanwhile, cardiology continued to follow closely  recognizing sick sinus syndrome along with her atrial fib/flutter  pattern and valvular heart disease, and did recommend pacemaker  placement.  When final culture of urine returned negative, vancomycin  was  stopped.  A dual-chamber pacemaker was placed on Mar 16, 2009.  She  tolerated that procedure well.  Discussion with her and her daughter  settled on discharge goal of returning to her own home with family and  visiting nurse attention.  Discharge planning coordinated this.  She was  finally discharged significantly improved with much less dyspnea on  oxygen, good spirits, little sputum, no pain, and no leg edema.  Air  flow was still poor on chest exam.  Her pacemaker pocket was dressed  appropriately.  Her protuberant abdomen was nontender and bowel sounds  were present and she was felt to have reached maximum hospital benefit  recognizing multiple medical problems.   LABORATORY DATA:  EKG on Mar 09, 2009, showed atrial flutter with  variable AV block, ST and T-wave abnormality, consider lateral ischemia.  Repeat on Mar 16, 2009, showed atrial flutter with AV block, left  anterior fascicular block, ST and T-wave abnormality, consider  anterolateral ischemia.  Chest x-ray on Mar 17, 2009, showed right  subclavian dual-lead transvenous pacemaker placement without acute  complicating features, resolution of bibasilar atelectasis, residual  small right pleural effusion with no acute cardiopulmonary disease,  otherwise.  There was an old sternotomy.  Heart size was at upper limits  of normal.  There was no longer evidence of pulmonary edema.  Initial  arterial blood gas on Mar 14, 2009, pH 7.48, PCO2 36, PO2 54,  bicarbonate 27, unspecified oxygen.  Initial WBC 11,200, peaked at  16,600, hemoglobin of 2.9, rose to 12.6, and was 11.7 at discharge with  WBC 7500.  Stool for occult blood had been positive twice.  Sedimentation rate upper limits of normal at 22.  BUN 9, sodium 134,  potassium 4.3, CO2 31, creatinine 0.68.  At discharge, INR was 1.8, on  warfarin.  Liver enzymes had shown total protein of 5.6, ALT of 40,  albumin of 3, total bilirubin normal.  Cardiac enzymes had shown no   evidence of injury.  B natriuretic peptide remains between 83 and 97.  Admission digoxin level 0.5.  Urine had shown 50,000 colonies of  Staphylococcus, coag negative, but on repeat showed no growth.   DISCHARGE PLANS:  She is to return home with family.  Follow up with  cardiology in a week, with Dr. Maple Hudson in 2 weeks.  Wound care per printed  pacemaker instruction.  Heart healthy diet.  Gradually increasing  activity as tolerated.   MEDICATIONS:  1. Betapace 80 mg twice daily.  2. Spiriva 1 daily.  3. Oxygen 3 L per minute continuous.  4. Home nebulizer with Xopenex 0.63 q.i.d. p.r.n.  5. Prilosec 20 or Zegerid 40 mg 1 daily.  6. Cardizem 240 mg reduced from 300 mg daily.  7. Digoxin 0.125 mg daily.  8. Colace 100 mg 1 or 2 daily.  9. MiraLax as needed.  10.Coumadin 2 mg on Thursday, 1 mg other  days.  11.Guaifenesin 1200 mg daily.  12.Zetia 10 mg daily.  13.Zoloft 50 mg daily.  14.Potassium 20 mEq twice daily.  15.Furosemide 1/2 of a 40 mg (equals 20 mg) twice daily.  16.Benzonatate 100 mg q.i.d. p.r.n. for cough.  17.Symbicort 160/4.5 two puffs and rinse twice daily.  18.She is to stop Singulair which was ineffective.      Clinton D. Maple Hudson, MD, Tonny Bollman, FACP  Electronically Signed     [name]/MEDQ  D:  04/05/2009  T:  04/06/2009  Job:  [id number]   cc:   Colleen Can. Deborah Chalk, M.D.

## 2011-03-20 NOTE — Assessment & Plan Note (Signed)
[facility]                             PULMONARY OFFICE NOTE   NAME:[name], [name]                     MRN:          [id number]  DATE:05/05/2007                            DOB:          1923/11/24    HISTORY OF PRESENT ILLNESS:  The patient is an 77 year old white female  patient of Dr. Roxy Cedar who has a known history of COPD with an asthmatic  component and allergic rhinitis, who presents today for an acute office  visit.  The patient complains over the last week she has had a  productive cough with thick greenish-brown sputum, nasal congestion, and  shortness of breath with activity.  The patient denies any hemoptysis,  orthopnea, PND, or leg swelling.   PAST MEDICAL HISTORY:  Reviewed.   CURRENT MEDICATIONS:  Reviewed.   PHYSICAL EXAM:  The patient is an elderly female in no acute distress.  She has a temperature of 99.1, blood pressure 130/68, O2 saturation 93%  on 2.5L.  HEENT:  Nasal mucosa with some mild erythema.  Nontender sinuses.  Posterior oropharynx is clear.  NECK:  Supple without cervical adenopathy.  No JVD.  LUNGS:  Sounds reveal diminished breath sounds at the bases with a few  scattered rhonchi.  CARDIAC:  Regular rate.  ABDOMEN:  Soft and nontender.  EXTREMITIES:  Warm without any edema.   IMPRESSION AND PLAN:  Acute chronic obstructive pulmonary disease flare-  up.  The patient is to begin Augmentin x7 days.  Continue on Mucinex DM  twice daily.  The patient was given a steroid pack to have on hold in  case symptoms do not improve.  The patient will follow back up with Dr.  Maple Hudson in 2 weeks or sooner if needed.      Rubye Oaks, NP  Electronically Signed      Clinton D. Maple Hudson, MD, Tonny Bollman, FACP  Electronically Signed   TP/MedQ  DD: 05/06/2007  DT: 05/06/2007  Job #: [id number]

## 2011-03-20 NOTE — Assessment & Plan Note (Signed)
[facility]                             PULMONARY OFFICE NOTE   NAME:[name], [name]                     MRN:          [id number]  DATE:09/17/2007                            DOB:          Nov 23, 1923    PULMONARY FOLLOWUP   PROBLEM LIST:  1. Allergic rhinitis.  2. Asthma with chronic obstructive pulmonary disease.  3. Mild obstructive sleep apnea.  4. Aortic valve replacement/atrial fibrillation/Coumadin.   HISTORY:  She has felt more short of breath with activities of daily  living moving room to room in her home over the past week or so.  Illness began with cough and wheeze.  She feels a little light-headed,  but has not recognized any change in the way her heart works, noting  that she has a history of aortic valve replacement and atrial  fibrillation.  Still in pulmonary rehabilitation, but finding it harder  to do the same amount of activity, and says she desaturates to 87% if  she is not wearing oxygen.   Her medication list is reviewed without changes.   OBJECTIVE:  Weight 143 pounds, BP 124/60, pulse 106, oxygen saturation  96% with a 3L demand regulator.  She is alert.  Breath sounds are very distant, but without wheeze or cough noted now.  Pulse is slightly irregular with a grade 1/6 systolic ejection murmur  again noted.  She does not have peripheral edema.   IMPRESSION:  Significant chronic obstructive pulmonary disease, probably  mild exacerbation, and I suspect it will be a viral illness.   PLAN:  Nasal saline gel, Depo-Medrol 80 mg IM, and then try maintenance  prednisone 10 mg daily.  We discussed supportive care and clues for  infections.  She will keep her scheduled appointment.     Clinton D. Maple Hudson, MD, Tonny Bollman, FACP  Electronically Signed    [name]/MedQ  DD: 09/21/2007  DT: 09/22/2007  Job #: [id number]   cc:   Titus Dubin. Alwyn Ren, MD,FACP,FCCP

## 2011-03-20 NOTE — Assessment & Plan Note (Signed)
[facility]                             PULMONARY OFFICE NOTE   NAME:WHITCOMBKaytlynne, Neace                     MRN:          [id number]  DATE:08/01/2007                            DOB:          28-Feb-1924    PROBLEM LIST:  1. Allergic rhinitis.  2. Asthma with chronic obstructive pulmonary disease.  3. Mild obstructive sleep apnea.  4. Aortic valve replacement/atrial fibrillation/Coumadin.   HISTORY:  The humidity is bothering her, and she also blames allergy  for chest and nasal congestion.  Says antihistamines all make her too  sleepy, including Xyzal taken at bedtime.  Abnormal sense of smell.  She  worries that it is body odor.  Nothing purulent or bloody, no sinus  pain.  She likes pulmonary rehabilitation.  We discussed rhinitis and  her previous positive skin testing.  I suggested that we not launch her  on allergy vaccine at this time, anticipating little gain.   Her medication list was reviewed and is significant for Symbicort  160/4.5.  She does have a Xopenex 1.25 mg nebulizer treatment.  Singulair 10 mg, Nasarel, oxygen at 2-3 L, digoxin 135 mcg, fexofenadine  30 mg.   OBJECTIVE:  Weight 138 pounds, BP 130/54, pulse 82, room air saturation  93%.  Mucosa is slightly red, no visible drainage.  Pharynx is unobstructed  with no stridor.  There is a trace 1/6 systolic ejection murmur at the aortic space.  Oxygen being worn at 3 L.  Chest sounds very quiet with this with no  edema.   IMPRESSION:  Chronic obstructive pulmonary disease with chronic hypoxic  respiratory failure and allergic rhinitis.  At her age, I am not  concerned about mild sleep apnea when she does not want to take  treatment.   PLAN:  1. Nasal nebulized inhalation saline.  2. Depo-Medrol 40 mg IM.  3. Nasal saline lavage.  4. Flu shot was discussed and given.  5. Schedule return 6 months, earlier p.r.n.     Clinton D. Maple Hudson, MD, Tonny Bollman, FACP  Electronically  Signed    [name]/MedQ  DD: 08/01/2007  DT: 08/02/2007  Job #: [id number]   cc:   Titus Dubin. Alwyn Ren, MD,FACP,FCCP

## 2011-03-20 NOTE — Assessment & Plan Note (Signed)
[facility]                         GASTROENTEROLOGY OFFICE NOTE   NAME:[name], [name]                     MRN:          [id number]  DATE:03/01/2008                            DOB:          [birth year]    REFERRING PHYSICIAN:  Titus Dubin. Alwyn Ren, MD,FACP,FCCP   PHYSICIAN REQUESTING CONSULTATION:  Titus Dubin. Alwyn Ren, M.D., FACP, [facility]   REASON FOR CONSULTATION:  Iron deficiency anemia and Hemoccult-positive  stool.   HISTORY:  This is an 77 year old female with multiple significant  medical problems who presents today at the request of Dr. Alwyn Ren  regarding an opinion and evaluation of iron deficiency anemia and  Hemoccult-positive stool.  Her complicated medical history is remarkable  for chronic obstructive pulmonary disease for which she is oxygen-  dependent, status post aortic valve replacement, atrial fibrillation,  chronic systemic anticoagulation in the form of Coumadin,  hyperlipidemia, gastroesophageal reflux disease, remote history of colon  polyps, and prior history of iron deficiency anemia.  The patient was  actually evaluated in this office on June 26, 2005 regarding iron  deficiency anemia.  This was felt secondary to known arteriovenous  malformations.  The process was felt to be accelerated by a combination  of aspirin and Coumadin.  However, due to her advanced age as well as  advanced multiple comorbidities, endoscopic evaluation was felt to be  too high a risk.  She was treated with iron.  She also has had some  intermittent problems with constipation.  She is noted to have irritable  bowel.  Her current complaints today are that of generalized fatigue,  difficulty breathing, gas, bloating, fullness, and constipation.  She  denies melena or hematochezia.  No heartburn or dysphagia.  She does ask  if we could switch from Zegerid to Prilosec for cost purposes.  Blood  work with Dr. Alwyn Ren revealed hemoglobin of 11.8, MCV of 76 on  February 18, 2008.  Hemoccult studies returned positive in 2 of 3 samples.  The  patient denies abdominal pain or weight loss.   ALLERGIES:  THE PATIENT IS ALLERGIC TO SULFA, MYCINS, CEPHALEXIN,  ESTRACE, ANALPRAM, PROMETHAZINE, CHEMDAL, DOXYCYCLINE, HYDROCORTISONE,  AVELOX.   CURRENT MEDICATIONS:  Symbicort, Spiriva, Cardizem, Lanoxin, furosemide,  Singulair, potassium chloride, Zetia, warfarin, Zegerid, Zoloft,  Mucinex, vitamin C, stool softeners, nasal spray, Xopenex, Coenzyme Q10,  prednisone, nasal cannula oxygen, Tussionex, multivitamin, nystatin,  albuterol, and fexofenadine.  See chart for dosages and frequencies.   PAST MEDICAL HISTORY:  Extensive, as outlined above.   FAMILY HISTORY:  Paternal aunt with colon cancer.   SOCIAL HISTORY:  Married with 3 daughters.  Retired Charity fundraiser for [employer].  Does not smoke or use alcohol currently.   REVIEW OF SYSTEMS:  Fatigue, dyspnea on exertion, and gas.  Otherwise,  negative.   PHYSICAL EXAMINATION:  GENERAL:  Chronically ill female in no acute  distress.  She is alert and oriented.  VITAL SIGNS:  Blood pressure 120/60, heart rate 96, respirations 20 and  regular.  Weight is 143 pounds.  HEENT:  Sclerae are anicteric.  Conjunctivae are pink.  Oral mucosa is  intact.  There was no adenopathy.  LUNGS:  Clear, though breath sounds are distant.  HEART:  Irregularly irregular with 2/6 systolic murmur.  ABDOMEN:  Obese and soft without tenderness, mass, or hernia.  Good  bowel sounds heard.  RECTAL:  Exam deferred.  EXTREMITIES:  Without edema.  SKIN:  She does have multiple ecchymoses on the skin.  NEUROLOGIC:  She is intact.   IMPRESSION:  Iron deficiency anemia and Hemoccult-positive stool.  As  thought previous, I suspect her problems are due to chronic  gastrointestinal blood loss from known colonic vascular malformations.  The process is almost certainly accelerated by the chronic use of  Coumadin.  She has had no  acute bleeding.  Her hemoglobin is moderately  depressed.  Again, I do not feel she is an appropriate candidate for any  endoscopic evaluations given her comorbidities.  As previous, I would  like to treat her with twice daily oral iron therapy.  I told her that  she should stay on this indefinitely.  If she has the side effect of  constipation, then she could use MiraLax.  However, she should continue  on iron.  She should also have her hemoglobin checked periodically with  Dr. Alwyn Ren to assure that she is responding to iron therapy.  If she is  not responding to oral iron therapy, then he may consider sending her  for intravenous iron transfusion or even prn blood transfusions. In  terms of changing her proton pump inhibitor, I have obliged her by  writing for equivalent dose of Prilosec in place of the Zegerid.     Wilhemina Bonito. Marina Goodell, MD  Electronically Signed    JNP/MedQ  DD: 03/01/2008  DT: 03/01/2008  Job #: [id number]   cc:   Titus Dubin. Alwyn Ren, MD,FACP,FCCP

## 2011-03-20 NOTE — Cardiovascular Report (Signed)
NAMEGLENDIA, OLSHEFSKI              ACCOUNT NO.:  [id number]   MEDICAL RECORD NO.:  [id number]          PATIENT TYPE:  INP   LOCATION:  3730                         FACILITY:  [facility]   PHYSICIAN:  Colleen Can. Deborah Chalk, M.D.DATE OF BIRTH:  10/07/1924   DATE OF PROCEDURE:  03/16/2009  DATE OF DISCHARGE:                            CARDIAC CATHETERIZATION   PROCEDURE:  Implantation of a dual-chamber pulse generator under  fluoroscopy.   INDICATIONS FOR PROCEDURE:  Profound sinus pause with atrial flutter.   PROCEDURE:  The right subclavicular area was prepped and draped.  The  area was infiltrated with 1% Xylocaine.  Subcutaneous pocket was created  to the prepectoral fascia using sharp and Bovie dissection.  Two  punctures were made into the subclavian vein over top of the first rib  after identification location of the subclavian vein by injection of IV  contrast.  Approximately 15 mL of contrast were used.   Using 7-French [facility] introducers, the ventricular and atrial leads were  introduced.  The ventricular lead was a Guidant model 4470, serial  number [id number] placed in the right ventricular septum.  The R waves were  measured at 8.0 mV.  The right ventricular lead impedance was 708 ohms.  Right ventricular threshold was measured of 0.4 V at 0.5 msec.  The  atrial lead was a Guidant model [id number], serial number [id number] 45-cm lead.  It was placed in the right ventricular appendage.  P waves were measured  at 1.7 mV, but they were flutter waves.  Right atrial lead impedance was  417 ohms.  The right ventricular atrial threshold was not obtained  because of the flutter.  Both leads were sutured in place.  The leads  were then connected to a Medtronic EnRhythm model P1501DR, serial number  [id number].  The unit was sutured in place.  The wound was flushed with  gentamicin solution.  The wound was closed with a 2-0 and subsequently 4-  0 Vicryl.  Steri-Strips were applied.  The patient tolerated  the  procedure well.      Colleen Can. Deborah Chalk, M.D.  Electronically Signed     SNT/MEDQ  D:  03/16/2009  T:  03/17/2009  Job:  [id number]

## 2011-03-22 ENCOUNTER — Ambulatory Visit (INDEPENDENT_AMBULATORY_CARE_PROVIDER_SITE_OTHER): Payer: Medicare Other

## 2011-03-22 DIAGNOSIS — J309 Allergic rhinitis, unspecified: Secondary | ICD-10-CM

## 2011-03-23 ENCOUNTER — Other Ambulatory Visit: Payer: Self-pay | Admitting: Internal Medicine

## 2011-03-23 NOTE — H&P (Signed)
NAMELAQUANTA, HUMMEL               ACCOUNT NO.:  [id number]   MEDICAL RECORD NO.:  [id number]          PATIENT TYPE:  INP   LOCATION:  6531                         FACILITY:  [facility]   PHYSICIAN:  Peter M. Swaziland, M.D.  DATE OF BIRTH:  Nov 26, 1923   DATE OF ADMISSION:  12/12/2004  DATE OF DISCHARGE:                                HISTORY & PHYSICAL   CHIEF COMPLAINT:  Dizziness.   HISTORY OF PRESENT ILLNESS:  [name] is a very pleasant 77 year old  female who has a known of severe COPD, as well as severe aortic stenosis.  She presents to the office as a work-in appointment from pulmonary  rehabilitation with complaints of dizziness that has basically been  persistent throughout the course of the day.  She has had no frank syncope.  While she was at pulmonary rehabilitation, she initially did well with her  exercise program; however, when she went to exercise on the treadmill, she  was noted to have an increase in her heart rate up into the 160s.  It was  felt to be atrial flutter.  Her rate was more controlled with rest.  She was  seen in the office, where he was noted to be more short of breath with just  minimal exertion.  With ambulation into the office, her heart rate climbed  back up into the 160-170 range.  She is subsequently admitted for further  evaluation for rate control, as well as anticoagulation.  She currently has  no awareness of her atrial fibrillation.  She has had no frank syncope.  She  has had no chest pain, but has had a significant amount of epigastric pain  over the past 2 days.  She has been more short of breath.  She has recently  been significantly ill with a viral bronchitis, and has had 2 rounds of  antibiotics.   PAST MEDICAL HISTORY:  1.  Severe COPD.  She is O2 dependent and uses home nebulizer therapy.  2.  Aortic stenosis.  Her last 2-D echocardiogram was in December of 2005.      Ejection fraction was 55% to 65%.  Aortic valve area was 0.6 cm  squared,      with a peak gradient of 55 mmHg.  There was moderate pulmonary      hypertension with left atrial enlargement noted, as well.  3.  Hypertension.  4.  Hiatal hernia.  5.  History of irritable bowel syndrome.  6.  Diverticulosis.  7.  Reports of mild sleep apnea.   SURGICAL HISTORY:  1.  Back surgery in 1979.  2.  History of tonsillectomy in the 1930s.  3.  Previous appendectomy in 1949.  4.  Ganglionectomy removed from the wrist in 1952.  5.  Vocal cord surgery due to polyps x2 in 1975 and 1984.   ALLERGIES:  1.  CODEINE.  2.  SULFA.  3.  MYCINS.  4.  ESTRACE.  5.  DOXYCYCLINE.  6.  PROMETHAZINE.  7.  KEFLEX.  8.  BIAXIN.   CURRENT MEDICATIONS:  1.  Advair twice a day.  2.  Albuterol twice a day.  3.  Nexium 40 mg daily.  4.  Cartia XT 180 mg daily.  5.  Lescol 40 mg daily.  6.  Zoloft 25 mg daily.  7.  Lasix 20 mg daily.  8.  Fosamax weekly.  9.  Ultram p.r.n.  10. Vitamins daily.  11. Calcium daily.  12. Astelin twice a day.  13. Mucinex p.r.n.  14. Spiriva daily.  15. Singulair daily.   FAMILY HISTORY:  Father died at 24 due to natural causes.  Mother died at  age 23, and had a history of rheumatic heart disease, as well as had had  aortic valve prosthesis in place.   SOCIAL HISTORY:  She is married.  She has no tobacco use over the past 10  years.  She has no alcohol use.   REVIEW OF SYSTEMS:  Basically as noted above.  She has had no frank syncope.  She has had more shortness of breath, and has been slow to recover from a  recent viral bronchitis type syndrome.  She has had some fullness of the  upper abdomen, and notes that she has chronic irritable bowel symptoms and  fluctuates between episodes of constipation and diarrhea.  She has had  really no complaints of edema.  No recent fever.  Currently without a  productive cough.   PHYSICAL EXAMINATION:  GENERAL:  She is a very pleasant elderly female who  is short of breath with just minimal  exertion.  VITAL SIGNS:  Weight is 150 pounds.  Blood pressure 140/80 sitting, 110/80  standing.  Heart rate is approximately 160.  Respiratory rate is 24.  SKIN:  Warm and dry.  Color is unremarkable.  LUNGS:  Prolonged expiratory phase, but basically clear breath sounds.  CARDIAC:  A tachycardic and irregular rhythm with an outflow murmur.  ABDOMEN:  Obese yet soft.  Positive bowel sounds.  EXTREMITIES:  Without edema.  NEUROLOGIC:  Intact.  There are no gross focal deficits.   LABORATORY DATA:  Pertinent labs are pending.   A 12-lead electrocardiogram from cardiac rehabilitation shows atrial flutter  with a more controlled ventricular response.   OVERALL IMPRESSION:  1.  Atrial fibrillation/flutter, currently with rapid ventricular response.  2.  History of severe chronic obstructive pulmonary disease requiring O2      dependency.  3.  Severe aortic stenosis.  4.  History of hypertension.  5.  History of hyperlipidemia.   PLAN:  Will proceed on with admission to the hospital.  Will continue her  rate with IV Cardizem, as well as begin digoxin.  She will be placed on  heparin anticoagulation.  Complete labs will be assessed.  Further treatment  plan to follow per Dr. Elvis Coil discretion.      LC/MEDQ  D:  12/12/2004  T:  12/12/2004  Job:  [id number]   cc:   Colleen Can. Deborah Chalk, M.D.  Fax: [phone]   Titus Dubin. Alwyn Ren, M.D. [facility]   Danice Goltz, M.D. [facility]

## 2011-03-23 NOTE — Discharge Summary (Signed)
NAME:  [name], [name]                        ACCOUNT NO.:  [id number]   MEDICAL RECORD NO.:  [id number]                   PATIENT TYPE:  INP   LOCATION:  4737                                 FACILITY:  [facility]   PHYSICIAN:  Rene Paci, M.D. [facility]          DATE OF BIRTH:  02-23-1924   DATE OF ADMISSION:  10/08/2003  DATE OF DISCHARGE:  10/12/2003                                 DISCHARGE SUMMARY   DISCHARGE DIAGNOSES:  1. Refractory respiratory insufficiency.  2. Acute exacerbation of chronic obstructive pulmonary disease.  3. Question subtle P&A.   BRIEF ADMISSION HISTORY:  [name] is a 77 year old white female with  known O2-dependent COPD.  The patient was seen by her primary care physician  on October 08, 2003.  He noted that he had seen her three times in November.  And had initially started seeing her in October with complaints of dyspnea.  He noted that on August 11, 2003, he treated her with Amoxicillin.  He then  re-evaluated her on August 14, 2003, at which time she was having a  suboptimal response to antibiotics.  On August 19, 2003, he started the  patient on Medrol and her antibiotics were changed to Augmentin.  She was  seen back on September 15, 2003.  At that time, her antibiotics were changed  to Avelox, and she states she took these for twelve days.  She then returned  on September 27, 2003, stating that she was only somewhat better.  She stated  she continued to have head congestion and sputum production.  At that time  she was noted to be afebrile and O2 sats were 93% on two liters.  Reportedly  Dr. Alwyn Ren made some adjustments in her maintenance medications and she was  started on a little bit of Ativan for her anxiety component.  She was seen  back in the office on October 08, 2003, stating that she had had increased  sputum production that was purulent with some streaky hemoptysis.  She was  also complaining of left pleuritic chest pain.  In the office,  her O2 sats  were 89% on 2 liters and pulse was 110.  Office chest x-ray suggested  possible pneumonitis versus bronchiectasis.   PAST MEDICAL HISTORY:  1. Oxygen-dependent COPD.  2. Atrial fibrillation.  3. Diverticulosis.  4. Gastroesophageal reflux disease.  5. Questionable history of mild sleep apnea.  6. Status post back surgery.  7. Status post tonsillectomy.  8. Status post appendectomy.  9. Status post ganglionectomy.  10.      Status post vocal cord polyp removal.   HOSPITAL COURSE:  Problem #1:  Pulmonary:  Patient presents with refractory  respiratory insufficiency with known O2-dependent COPD.  The patient was  admitted for a possible another exacerbation of her COPD exacerbated perhaps  by pneumonia.  She was admitted and started on steroids, nebulizers and  antibiotics.  A CT of her chest was  obtained.  This revealed opacification  within the inferior lung zone, left greater than right, thought to represent  either atelectasis versus infiltrate.  It was recommended she have follow up  until clearance to exclude some underlying malignancy.  She was also noted  to have underlying COPD changes.  There is no evidence of pulmonary embolus.  Mediastinal lymph nodes were normal in size.  Patient's condition slowly  improved.  She has been tapered to oral steroids.  Her antibiotics were  discontinued as she had already had multiple courses of antibiotics and  there was no conclusive evidence of pneumonia or infectious process at this  time.  The patient has remained afebrile off the antibiotics.  It was felt  though the patient does need further pulmonary evaluation.  Therefore, we  have arranged for the patient to follow up with Dr. Jayme Cloud as an  outpatient.  She is felt to be stable for discharge home at this time as she  is maintaining O2 sats of 97% on 2 liters, which is her home dose of oxygen.   LABS AT DISCHARGE:  Hepatic function profile is normal.  Total  cholesterol  is 157, triglycerides 80, HDL 63, LDL 78.  BMET was normal.  Coags are  normal.  White count was 12.5, hemoglobin 11.9.   MEDICATIONS AT DISCHARGE:  1. Prednisone 20 mg two tabs for three days and then one tab for three days     then one-half tab for three days then stop.  2. Albuterol nebulized with Atrovent q.i.d.  3. She has been instructed to hold her Advair until she follows up with     either Dr. Alwyn Ren or Dr. Jayme Cloud.  4. Cartia XT 180 mg daily.  5. Zoloft 50 mg daily.  6. Lescol 40 mg daily.  7. Nexium 40 mg daily.  8. Spiriva as at home.  9. Lasix 20 mg daily.  10.      Guaifenesin b.i.d.   She has an appointment to follow up with Dr. Jayme Cloud on Wednesday, October 13, 2003, at 1:30 p.m. and then Dr. Alwyn Ren in two to three weeks.      Cornell Barman, P.A. [facility]                  Rene Paci, M.D. [facility]    LC/MEDQ  D:  10/12/2003  T:  10/12/2003  Job:  [id number]   cc:   Danice Goltz, M.D. [facility]   Titus Dubin. Alwyn Ren, M.D. [facility]

## 2011-03-23 NOTE — Assessment & Plan Note (Signed)
[facility]                               PULMONARY OFFICE NOTE   NAME:[name], [name]                     MRN:          [id number]  DATE:08/14/2006                            DOB:          [birth year]    FOLLOWUP NOTE   This is a very complex, 77 year old white female who follows here for  chronic obstructive pulmonary disease, who has a minor bronchitic component.  She also has chronic rhinitis and sinus congestion.  The patient presents  today with no chart available despite many attempts to tracking the chart.  This could not be found.  She has had difficulties stating that she had  increased allergy issues with itchy, watery eyes.  She has attempted to use  Zyrtec but this makes her too sleepy and unable to function well during the  day.  She also has noted increased sinus pressure and nasal congestion;  however, her nasal discharge is entirely clear.  She recently saw Dr. Osborn Coho who found mostly just changes consistent with allergies.  The  patient states that she has been coughing up slightly blood-tinged sputum,  somewhat yellow, however, she is unable to produce anything today and is not  coughing at all today.  The patient denies any fevers, chills, or sweats.   CURRENT MEDICATIONS:  Include:  1. Symbicort 160/4.5 two inhalations twice a day.  The patient uses this      with spacers.  2. Spiriva 18 mcg 1 capsule inhaled daily.  3. Cardizem LA 300 mg daily.  4. Digitek 0.125 mg daily.  5. Furosemide 40 mg daily.  6. Singulair 10 mg daily.  7. Potassium chloride 20 mEq daily.  8. Zetia 10 mg daily.  9. Warfarin 1 mg or as directed by Dr. Ronnald Nian office.  10.Zegerid 40 mg at bedtime.  11.Zoloft 50 mg at bedtime.   The patient also takes Mucinex p.r.n.  She has not been taking this lately.  Multivitamin 1 daily.  Vitamin C 1 daily and a stool softener.  The patient  also is on Xopenex nebulizations p.r.n.  The patient also  uses Nasarel 2  inhalations daily to each nostril.   PHYSICAL EXAM:  VITALS:  As noted.  Oxygen saturation is 95% on 2 L nasal  cannula.  GENERAL:  This is a well-developed, elderly white female who is in no acute  distress.  She has a very nasal speech.  NECK:  Supple. No adenopathy noted.  No JVD.  LUNGS:  Entirely clear to auscultation bilaterally though somewhat distant.  CARDIAC:  Irregular rate with a controlled ventricular response.  ABDOMEN:  Benign.  EXTREMITIES:  No cyanosis.  No clubbing.  No edema noted.   IMPRESSION:  1. Chronic rhinitis with acute flare due to a recent exposure to      allergens.  2. Recurrent atrial fibrillation which is a problem for the patient and it      aggravates her dyspnea, likely because of decreased atrial kick and      decreased cardiopulmonary interaction.   PLAN:  1. To continue her respiratory  hygiene program as she is currently doing.      We have advised her to resume Mucinex 2 tablets twice a day.  Because      Zyrtec is causing difficulties with somnolence, we will have her take      Clarinex 10 mg over-the-counter.  We will switch her Nasarel to 2 puffs      of Veramyst daily to each nostril times 3 days and then decrease to 1      puff daily to each nostril.  I do not believe that any antibiotics are      indicated at present.  2. For health maintenance issues, we will provide her with a flu vaccine.  3. She is to continue followup with Dr. Deborah Chalk particularly with regards      to potential ablation for her atrial fibrillation.  4. Followup will be in 2 months' time.  She is to contact us prior to that      time should any problems arise.       [name], MD      CLG/MedQ  DD:  08/14/2006  DT:  08/16/2006  Job #:  [id number]   cc:   Colleen Can. Deborah Chalk, M.D.

## 2011-03-23 NOTE — Cardiovascular Report (Signed)
NAMEKHRISTIAN, [name]               ACCOUNT NO.:  [id number]   MEDICAL RECORD NO.:  [id number]          PATIENT TYPE:  OIB   LOCATION:  2855                         FACILITY:  [facility]   PHYSICIAN:  Colleen Can. Deborah Chalk, M.D.DATE OF BIRTH:  09/27/1924   DATE OF PROCEDURE:  02/20/2005  DATE OF DISCHARGE:                              CARDIAC CATHETERIZATION   PROCEDURE:  Left and right heart catheterization with selective coronary  angiography and left ventricular angiography.   CARDIOLOGIST:  Colleen Can. Deborah Chalk, M.D.   TYPE AND SITE OF ENTRY:  Percutaneous right femoral artery, percutaneous  right femoral vein.   CATHETERS:  A 7-French Swan-Ganz thermodilution catheter, a 6-French #4  curved Judkins right and left coronary catheters, a 6-French pigtail  ventriculographic catheter.   CONTRAST MATERIAL:  Omnipaque.   MEDICATIONS GIVEN PRIOR TO PROCEDURE:  Valium 5 mg p.o.   MEDICATIONS GIVEN DURING PROCEDURE:  Versed 2 mg IV.   COMMENTS:  The patient tolerated the procedure well.   HEMODYNAMIC DATA:  Right atrium:  A-wave of 9, V-wave of 6, mean of 5.  RV:  36/3-5.  TA:  35/11-23.  Pulmonary capillary wedge pressure:  A-wave of 14, V-wave of 13, mean of 11.  Aortic pressure:  145/75.  LV pressure:  163/7-10.  Aortic valve gradient by pullback was 28 mmHg, with a mean of 23 mmHg.  The Fick cardiac output was 3.8 with a cardiac index of 2.3.  Thermodilution cardiac output was 4.9 with an index of 2.9.  Oxygen saturation:  97% in the aorta and 72% in the main pulmonary artery.  The aortic valve area by Fick:  0.76 sq/cm.   CORONARY ARTERIES:  1.  LEFT MAIN CORONARY ARTERY:  The coronary arteries arise and distribute      normally.  There is calcification in the aortic root near the left main      coronary artery.  It appears that the left main coronary artery is free      of severe ostial disease.  2.  LEFT CIRCUMFLEX CORONARY ARTERY:  The left circumflex coronary artery      has  irregularities with less than a 20%-30% narrowing proximally.  It      has a high proximal obtuse marginal and then a bifurcating distal      posterolateral branch without significant obstructive disease.  3.  LEFT ANTERIOR DESCENDING CORONARY ARTERY:  The left anterior descending      coronary artery has irregularities, with no significant focal disease.  4.  RIGHT CORONARY ARTERY:  The right coronary artery is a dominant vessel.      It has irregularities without significant focal disease.   LEFT VENTRICULAR ANGIOGRAM:  The left ventricular angiogram is performed in  the RAO projection.  Overall cardiac size and silhouette were normal and the  global ejection fraction is normal.  There was no mitral regurgitation  noted.   The aortic root was not performed, since by echocardiogram there was no  significant aortic insufficiency.   OVERALL IMPRESSION:  1.  Severe aortic stenosis with calcification in the aortic root and  aortic      valve.  2.  Diffuse insignificant coronary irregularities.  3.  Calcification of the aortic root in the vicinity of the left main      coronary artery, but without apparent obstructive disease in the left      main coronary artery.  4.  Normal left ventricular function.   DISCUSSION:  These findings will be reviewed with cardiovascular surgery.  It is felt that [name] probably needs to undergo an aortic valve  replacement at this point in time, given the nature of her severe chronic  obstructive pulmonary disease and the overall condition that she was in, in  February when she had a severe episode of bronchitis.  It is felt that two  co-morbid diseases need to be addressed at this point in time with her  significant and severe aortic stenosis, creating life-threatening situations  with exacerbation of her chronic obstructive pulmonary disease.      SNT/MEDQ  D:  02/20/2005  T:  02/20/2005  Job:  [id number]

## 2011-03-23 NOTE — Discharge Summary (Signed)
[name], [name] NO.:  [id number]   MEDICAL RECORD NO.:  [id number]          PATIENT TYPE:  OIB   LOCATION:  3735                         FACILITY:  [facility]   PHYSICIAN:  Duke Salvia, MD, FACCDATE OF BIRTH:  10/08/1924   DATE OF ADMISSION:  09/18/2006  DATE OF DISCHARGE:  09/19/2006                                 DISCHARGE SUMMARY   This patient has multiple allergies.  They are to:  1. AVELOX.  2. SALFLEX.  3. CODEINE.  4. DOXYCYCLINE.  5. ESTRACE.  6. HYDROCORTISONE.  7. SULFA.  8. PROMETHAZINE.  9. ERYTHROMYCIN.  10.CLARITHROMYCIN.   PRINCIPAL DIAGNOSES:  1. Discharging day 1, status post electrophysiology study/ESI mapping of      atrial dysrhythmias.      a.     Cavotricuspid isthmus block.      b.     Remapping with a second focus/ablation of high cristal flutter.  2. Aortic stenosis status post aortic valve replacement with porcine valve      April 2006.  3. Atrial flutter.  The patient has had DC CV x2 and has failed Rythmol.      Her atrial flutter is atypical.   SECONDARY DIAGNOSES:  1. Bronchospastic lung disease/emphysema, on home O2.  She has severe      pulmonary affliction, and her pulmonary status is tenuous when      challenged.  2. Gastroesophageal reflux disease.  3. Degenerative joint disease.  4. Anxiety.  5. Depression.  6. Hypertension.  7. Dyslipidemia.  8. Left heart catheterization, April 2006.  The study showed diffuse      insignificant coronary irregularities and normal, left ventricular      function.   PROCEDURE:  September 18, 2006, electrophysiology study with [facility] mapping of  the atria, successful ablation with creation of cavotricuspid isthmus  conduction block and catheter ablation of a second focus in the right  successful radiofrequency catheter ablation of a right atrial cristal  tachycardia.  The patient is to continue on Coumadin for now.   BRIEF HISTORY:  Ms. Daijah Scrivens is an 77 year old  female.  She has a  history of aortic stenosis.  She underwent aortic valve replacement with a  porcine valve in April 2006.  She has been bothered by atrial flutter since  her surgery.  She has failed cardioversion x2, and the second time she was  on Rythmol antiarrhythmic.  This was subsequently stopped when she reverted  to atrial flutter.   She has extremely tenuous pulmonary status.  She has oxygen at home.  She  has oxygen-dependent COPD secondary to broncho spastic lung disease and  emphysema.  She uses her oxygen 24/7 and uses nebulizer therapy 4 times  daily.  Atrial flutter leaves the patient weaker and less likely to tolerate  exercise.  The patient is not always aware of atrial dysrhythmias.  On an  office visit September 03, 2006, with Dr. Graciela Husbands, electrocardiogram  demonstrated atypical atrial flutter, although the patient was unaware that  her heart was racing.  The patient says her heart races inappropriately to  exercise levels when she is at cardiac rehab.   HOSPITAL COURSE:  The patient presented electively on September 18, 2006.  She underwent successful mapping of the atrium with ESI assistance.  Dr.  Graciela Husbands was able to ablate 2 separate atrial dysrhythmias.  The patient was  then observed overnight.  She did miss her daily Lasix on November 14 but  was able to take her home nebulizer therapy because she brought in her own  nebulizer equipment.  The patient awoke, however, on November 15 with chest  tightness and quite wheezy.  She took Xopenex nebulizer therapy with some  minor improvement.  She was seen early that morning at about 7:30.  She was  started on IV Lasix and given guaifenesin and some potassium  supplementation.  By noon, she felt much better and has had quite a  significant diuresis.  She will be asked to take a second oral Lasix tablet  this afternoon November 15 and an extra potassium 20 mEq.  Otherwise, she  would discharges on her regular medications  except that diltiazem will be  decreased from 300 mg daily to 240 mg.  This is a new dose.  The patient  discharges on home oxygen at 2 L.  She is asked not to lift anything heavier  than 10 pounds.   MEDICATIONS:  1. Spiriva 1 puff daily.  This is 18 mcg per inhalation dose.  2. Xopenex nebulizer therapy 1.25 mg q.i.d.  3. Lasix 40 mg daily.  She is to take a second dose this afternoon at      about 3:00.  4. Potassium chloride.  Take an extra 20 mEq today in the afternoon.  5. Guiafenesin 600 mg 2 tabs twice daily.  6. Singulair 10 mg daily.  7. Zoloft 50 mg daily.  8. Diltiazem 240 mg daily.  This a new dose.  9. Digoxin 0.125 mg daily.  10.Multivitamin daily.  11.Zetia 10 mg daily.  12.Coumadin 1 mg daily except 2 mg on Sunday.   She sees Dr. Graciela Husbands at [facility], [address],  Thursday, December 13, at 11:00.  Pro time at discharge is 24.2, INR 2.1.  Laboratory studies pertinent to this admission were drawn September 11, 2006.  Complete blood count:  White cells 10.1, hemoglobin 13.7, hematocrit 41.6,  platelets 218, sodium was 140,  potassium 3.5, chloride 100, carbonate 32, glucose 106, BUN is 15,  creatinine 1.0.  She did say that she felt return to baseline as far as  pulmonary status is concerned.  She was told to contact her primary  caregiver if she has any increased trouble with breathing or if it is very  serious, to come to the emergency room in follow up.      [name], [place]      Duke Salvia, MD, [facility]  Electronically Signed    GM/MEDQ  D:  09/19/2006  T:  09/19/2006  Job:  [id number]   cc:   Titus Dubin. Alwyn Ren, MD,FACP,FCCP  Duke Salvia, MD, [facility]  Colleen Can. Deborah Chalk, M.D.

## 2011-03-23 NOTE — Procedures (Signed)
[name], House               ACCOUNT NO.:  [id number]   MEDICAL RECORD NO.:  [id number]          PATIENT TYPE:  OUT   LOCATION:  SLEEP CENTER                 FACILITY:  [facility]   PHYSICIAN:  Marcelyn Bruins, M.D. [facility] DATE OF BIRTH:  [birth year]   DATE OF STUDY:  05/21/2005                              NOCTURNAL POLYSOMNOGRAM   REFERRING PHYSICIAN:  Dr. Danice Goltz   INDICATION FOR THE STUDY:  Hypersomnia with sleep apnea. Epworth score: 10.   SLEEP ARCHITECTURE:  The patient had a total sleep time of 271 minutes with  adequate slow wave sleep but very little REM. Sleep onset latency was  prolonged at 82 minutes, as was REM onset. Sleep efficiency was only 63%.   IMPRESSION:  1.  Mild obstructive sleep apnea/hypopnea syndrome with a respiratory      disturbance index of 14 events per hour and oxygen desaturation as low      as 89%. Events were not positional. This degree of sleep apnea may be      treated with weight loss, oral appliance, upper airway surgery, or C-      PAP. A split night study was not done since most of the patient's events      occurred after 2:00 a.m..  2.  Moderate snoring noted throughout the study.  3.  Small numbers of leg jerks with significant sleep disruption. Clinical      correlation is suggested after treatment of sleep apnea.  4.  It should be noted the patient wore 1 liter of oxygen per minute by      nasal cannula throughout. She usually wears 2 liters at home.     ______________________________  Suzzette Righter    KC/MEDQ  D:  05/30/2005 11:35:10  T:  05/30/2005 13:35:56  Job:  [id number]

## 2011-03-23 NOTE — Assessment & Plan Note (Signed)
[facility]                         ELECTROPHYSIOLOGY OFFICE NOTE   NAME:WHITCOMBTyann, [name]                     MRN:          [id number]  DATE:10/17/2006                            DOB:          October 20, 1924    [name] is seen following an ablation procedure that was undertaken  for an atypical flutter.  It was noted that there was cavotricuspid  isthmus conduction and RF energy was applied across the cavotricuspid  isthmus without significant benefit.  We then restored sinus rhythm and  realized that there was cavotricuspid isthmus block and retrospectively,  mapping had demonstrated the focus of the tachycardia appeared to be  high crista with counter clockwise rotation around the tricuspid  annulus.  RF energy was then applied in the context of a created  electroanatomical geometry and flutter could not be reinduced.   The patient then did well. Four weeks later or a week or so ago, she was  at rehab and was noted to have irregular tachy palpitations and tracing  was undertaken demonstrating, again, an atypical flutter with an atrial  cycle length of about 260 milliseconds or so.  It has been associated  with some shortness of breath.   On examination, [name]'s blood pressure is 126/72, her pulse is  79.  Her lungs were clear but with markedly decreased breath sounds.  Heart sounds were regular.   Electrocardiogram today demonstrated sinus rhythm of 79 with intervals  of 0.10/0.06/0.34.   IMPRESSION:  1. Atrial flutter/tachycardia with previous ablation with caveat as      noted previously.  2. Recurrence, although now significant modification has likely      occurred as this episode of tachycardia was self-limiting which is      different from her previous arrhythmias.  3. Oxygen dependent chronic obstructive pulmonary disease.  4. Aortic stenosis status post aortic valve replacement.   [name] is doing OK.  She and I are both  a little bit disappointed  about the outcome with the recurrence of the atrial arrhythmia.  Clearly, there is a change in the sub-straight manifested by the  spontaneous termination of the rhythm and the long interval quiescence.  We will plan to see her again in about 6-8 weeks to see how she is  doing.  She will be seeing Dr. Deborah Chalk in the interval and I will wait  to hear from him, his thoughts prior to proceeding in any specific  direction.     Duke Salvia, MD, [facility]  Electronically Signed    SCK/MedQ  DD: 10/17/2006  DT: 10/17/2006  Job #: [id number]   cc:   Colleen Can. Deborah Chalk, M.D.

## 2011-03-23 NOTE — H&P (Signed)
NAME:  [name], [name]                        ACCOUNT NO.:  [id number]   MEDICAL RECORD NO.:  [id number]                   PATIENT TYPE:  INP   LOCATION:  3731                                 FACILITY:  [facility]   PHYSICIAN:  Titus Dubin. Alwyn Ren, M.D. [facility]         DATE OF BIRTH:  Aug 20, 1924   DATE OF ADMISSION:  06/16/2002  DATE OF DISCHARGE:                                HISTORY & PHYSICAL   [name] is a 77 year old white female admitted with progressive  shortness of breath, suggestive of COPD exacerbation, complicated by new  onset atrial fibrillation.   She has had progressive shortness of breath which is worse than usual with  paroxysmal loss of breath. This has been aggravated by climbing a hill or  any exertion and by the heat and humidity.  This has been associated with  head congestion and the production of yellow sputum.  Waist flexion  aggravates her symptoms.   Her last admission to hospital was in 1995 for bronchitis.  She had back  surgery in 1994.  She had tonsillectomy and adenoidectomy in 1930.  She has  had an appendectomy remotely.  She had a ganglionectomy in 1952.  In the  1970s, she had vocal cord polyps removed and again in 1984.  She was  hospitalized for pneumonia in 1980, and for abdominal pain in 1987.   She quit smoking in 1995.  She does not drink.  She does not exercise on a  regular basis.   Her father died at 57 of renal failure.  Paternal aunt had colon cancer.  Mother had valvular heart disease and bypass grafting.  Maternal grandmother  had stroke.  Paternal grandfather had heart disease.  Paternal grandfather  had myocardial infarction.  Aunt had hypertension, and daughter had breast  cancer.   She has multiple drug allergies or intolerances, including CODEINE, SULFA,  MYCINS, CEPHALEXIN (she states she can take amoxicillin), ESTRACE, ANALPRAM,  PROMETHAZINE WITH CODEINE, CHEMBAL, NASONEX, FLONASE, HYDROCORTISONE CREAM,  DOXYCYCLINE.   She is on Serevent 2 sprays twice a day, Azmacort 2 sprays twice a day,  Atrovent 4 puffs twice a day, Zoloft 50 mg daily, Metamucil 1.5 tablespoons  daily, Lescol 40 mg daily, Fosamax 70 mg weekly, Ultram 50 mg as needed,  multiple vitamin supplements, and guiafenesin as needed.   Review of systems includes esophageal reflux and dyspepsia, despite being on  Nexium 40 mg daily.  She has red rectal bleeding in association with  constipation only.   She has noted some clear drainage from her right ear, and uses topical  steroids there, despite her history of reaction to hydrocortisone cream.   She has had some right pleuritic pain.   She has also noted dark stool, but only when taking Pepto-Bismol.   She rarely has dysuria.   She has had night sweats, but denies chills or fever.   She has some paresthesias of her arms and hands.  She has been under increased stress lately as her husband is being treated  for apparently a microbacterial infection in Pulmonary Critical Care at  [facility].  He is unable to drive due to visual complications of his  medication.  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   She appears deconditioned and short of breath with minimal exertion.  Weight  is down 3 pounds to 159.  Temperature 97.3.  Pulse 68 and variable.  Respiratory rate 19 to 24.  Blood pressure 140/74.  She has mild erythema of  the nares and oropharynx.  The tympanic membranes are slightly dull.  Pupils  are equal, round, and reactive to light.  I feel that _________ is present.  She has no lymphadenopathy about the head, neck, or axilla.  Thyroid is  normal to palpation.  Breath sounds are markedly decreased.  A regular  distant heart rhythm is noted.  She has no edema, and all pulses are intact.  There is no aortic aneurysm.  Abdomen is protuberant, however, without  organomegaly.  Skin is warm and dry.  She degenerative joint  changes of her  hands.  There are no localizing neurologic or neuropsychiatric deficits,  although she is extremely anxious.   EKG reveals atrial fibrillation with rate of 90.  There were nonspecific T  changes in V1 through V3.   She will be admitted to telemetry with heparin and IV Cardizem.   Once she is stable, a 2D echocardiogram will be performed.   Calcium, magnesium, potassium, and thyroid functions will be collected.   She will be treated for the ____________ purulent sputum with IV Tequin.   Cardiology consultation may be necessary if she fails to convert.  It is  more likely than not that the atrial fib is compromising her tenuous  cardiopulmonary status and conversion to a normal sinus rhythm would  obviously be preferable.                                               Titus Dubin. Alwyn Ren, M.D. [facility]    WFH/MEDQ  D:  06/17/2002  T:  06/20/2002  Job:  [id number]   cc:   Oley Balm. Sung Amabile, M.D. [facility]   John N. Eda Keys., M.D. [facility]

## 2011-03-23 NOTE — Assessment & Plan Note (Signed)
[facility]                             PULMONARY OFFICE NOTE   NAME:WHITCOMBKristol, Almanzar                     MRN:          [id number]  DATE:01/08/2007                            DOB:          1924-02-09    This is a very pleasant 77 year old white female who follows here for  chronic obstructive pulmonary disease with asthmatic bronchitic  component of moderate severity. She is however oxygen dependent for  multiple factors. She has a history of chronic atrial fibrillation. Has  failed ablation. She also is status post aortic valve replacement and  she has issues with chronic rhinosinusitis. The patient presents today  complaining of continued issues with rhinosinusitis. She wants to have  allergy evaluation. She denies any fevers, chills or sweats and has had  no cough or sputum production. She was unable to take Zyrtec due to  increased somnolence.   CURRENT MEDICATIONS:  Are as noted on the Intake Sheet and these have  been reviewed and are accurate.   PHYSICAL EXAMINATION:  VITAL SIGNS: Oxygen saturation was 94% on 2.5  liters nasal canula O2.  GENERAL: This is a well-developed, well-nourished female who is in no  acute distress.  HEENT: Examination is remarkable for nasal turbinate edema.  NECK: Supple. No adenopathy noted. No JVD.  LUNGS:  Clear to auscultation bilaterally. She is actually moving air  very well.  CARDIAC: Regular rate and rhythm. She has crisp valve sounds.  ABDOMEN: Benign.  EXTREMITIES: The patient has no cyanosis, clubbing or edema noted.   IMPRESSION:  1. Chronic rhinosinusitis, clearly with an allergic component.  2. Chronic obstructive pulmonary disease with asthmatic bronchitic      component. The patient is actually fairly well compensated.   PLAN:  1. Note is made that the patient HAS NOT BEEN TOLERANT TO VERAMYST due      to epistaxis. She is using Nasarel p.r.n. when her rhinitis      symptoms flare. She was  instructed to continue this and also to use      nasal saline as needed.  2. We will give her a trial of Xyzal for p.r.n. for allergic symptoms.  3. Refer her to Dr. Jetty Duhamel for allergy evaluation.  4. Followup will be with our nurse practitioner, Tammy Parrett, in 1      to 2 weeks time to see how she is doing with her trial. She is to      contact us prior to that time should any new problems arise.     [name], MD  Electronically Signed   CLG/MedQ  DD: 01/08/2007  DT: 01/08/2007  Job #: [id number]

## 2011-03-23 NOTE — Cardiovascular Report (Signed)
[name], [name]               ACCOUNT NO.:  [id number]   MEDICAL RECORD NO.:  [id number]          PATIENT TYPE:  OIB   LOCATION:  2858                         FACILITY:  [facility]   PHYSICIAN:  Colleen Can. Deborah Chalk, M.D.DATE OF BIRTH:  Jan 03, 1924   DATE OF PROCEDURE:  05/29/2005  DATE OF DISCHARGE:                              CARDIAC CATHETERIZATION   PROCEDURE:  Cardioversion.   ANESTHESIA:  Dr. Sandford Craze with propofol 80 mg IV.   PROCEDURE:  Using 50-watt seconds of biphasic energy on an anterior and  posterior paddle synchronized, the patient received one shock and converted  to normal sinus rhythm.       SNT/MEDQ  D:  05/29/2005  T:  05/29/2005  Job:  [id number]

## 2011-03-23 NOTE — Assessment & Plan Note (Signed)
[name]                             PULMONARY OFFICE NOTE   NAME:[name], House                     MRN:          [id number]  DATE:03/06/2007                            DOB:          1923/11/25    She is a very pleasant, 77 year old, white female who follows here for  chronic obstructive pulmonary disease with asthmatic bronchitic  component. She also has severe allergic rhinitis and issues with chronic  intermittent hoarseness and post nasal drip. She has been recently  evaluated by Dr. Jetty Duhamel who is to do full allergy testing. She  was seen on April 10 at that time. The patient is to continue followup  with Dr. Maple Hudson after my departure from this practice.   The patient has had issues with sneezing, itchy, watery eyes. She has  been using Xyzal with some mixed efficacy. She has been using Singulair  as well. She has not been able to tolerate steroid inhalers because of  nose bleeds. She does report maybe trace of this but the last time was a  week ago. She discontinued her Veramyst at that time due to that. She  does feel better when she is on an inhaled corticosteroid nasally. The  patient describes her usual symptoms of dyspnea class 2-3.  She is  status post aortic valve replacement with bioprosthetic valve. She is on  Coumadin for paroxysmal atrial fibrillation. For these issues, she  follows with Dr. Delfin Edis.   CURRENT MEDICATIONS:  As noted on the intake sheet. These have been  reviewed and are accurate.   PHYSICAL EXAMINATION:  VITAL SIGNS:  As noted. Oxygen saturation is 96%  on 2.5 liters nasal cannula O2.  GENERAL:  She is a well-developed, well-nourished, female who is in no  acute distress.  HEENT:  Reveals nasal turbinate edema. She does not have any blood  noted. Pharynx is clear.  NECK:  Supple, no adenopathy noted. No JVD.  LUNGS:  Distant but clear to auscultation bilaterally.  CARDIAC:  Regular rate and  rhythm, no rubs, murmurs or gallops heard.  EXTREMITIES:  No cyanosis, no clubbing, no edema noted.   IMPRESSION:  1. Allergic rhinitis with flare.  2. Epistaxis due to nasal corticosteroids. None over the last week.  3. Dyspnea, class 2-3, multifactorial, due to cardiac disease and      chronic obstructive pulmonary disease.  4. Chronic Respiratory failure. Patient on oxygen therapy.   PLAN:  1. Give the patient a trial of Omnaris 50 mcg 2 inhalations daily to      each nostril to see if she tolerates this.  2. Continue nasal hygiene as per previous instructions.  3. Continue followup with Dr. Jetty Duhamel as I will be leaving the      practice. Her followup appointment has already been scheduled with      Dr. Maple Hudson.     Andrea Shelter, MD  Electronically Signed    CLG/MedQ  DD: 03/07/2007  DT: 03/07/2007  Job #: [id number]

## 2011-03-23 NOTE — Op Note (Signed)
NAMESTEPHANI, JANAK               ACCOUNT NO.:  [id number]   MEDICAL RECORD NO.:  [id number]          PATIENT TYPE:  INP   LOCATION:  2302                         FACILITY:  [facility]   PHYSICIAN:  Sheliah Plane, MD    DATE OF BIRTH:  1924-05-21   DATE OF PROCEDURE:  02/23/2005  DATE OF DISCHARGE:                                 OPERATIVE REPORT   PREOPERATIVE DIAGNOSIS:  Critical aortic stenosis.   POSTOPERATIVE DIAGNOSIS:  Critical aortic stenosis.   SURGICAL PROCEDURE:  Aortic valve replacement with a Medtronic Mosaic  porcine heart valve, model #305, serial #[id number].   SURGEON:  Sheliah Plane, M.D.   FIRST ASSISTANT:  Theda Belfast, P.A.   BRIEF HISTORY:  Patient is a 77 year old female who has been followed for  several years with aortic stenosis.  In addition, she has obstructive lung  disease and requires home oxygen but in spite of this, has remained  relatively functional.  FEV1 is approximately 1.  Because of worsening  evidence of congestive heart failure and worsening aortic stenosis by  echocardiogram with increasing symptoms, aortic valve replacement was  discussed with the patient.  She is aware of the increased risk of the  procedure because of her underlying pulmonary disease, but at this point,  has critical aortic stenosis and relatively stable pulmonary status at this  point.  Aortic valve replacement was recommended to the patient, who agreed  and signed informed consent.   DESCRIPTION OF PROCEDURE:  TEE was placed, and the findings are dictated  under a separate note by anesthesia.  The patient had severely calcific  trileaflet valve with severe calcification of a noncoronary cusp.  The skin  of the chest and legs was prepped with Betadine and draped in a usual  sterile manner.  A median sternotomy was performed.  The pericardium was  opened.  The patient had evidence of mild-to-moderate left ventricular  hypertrophy.  She had evidence of  calcification of the aortic root,  involving the ascending aorta along the inner curve.  The distal aorta was  soft and a suitable cannulation site was selected.  Retrograde cardioplegia  catheter and right atrial cannula were placed.  Patient is placed on  cardiopulmonary bypass at 2.4 L/min/m2.  A right superior pulmonary vein was  then placed, and patient's body temperature is cooled to 38 degrees.  The  aortic cross clamp was applied, and 500 cc of cold-blood cardioplegia was  administered antegrade.  Additional cold-blood cardioplegia was administered  retrograde.  Initially, the aorta was opened enough to give visualization of  the valve.  The valve was tricuspid valve with very severe calcification in  all three leaflets and an almost total fixation of the noncoronary cusp.  The valve was excised.  Care was taken to remove all loose calcific debris.  The patient, because of calcification both in the annulus and the ascending  aorta, a 21 Mosaic tissue valve was selected; however, to get the valve to  see in the annulus, the aortotomy was modified and carried down across the  sinotubular ridge towards the anterior leaflet  of the mitral valve.  With  this performed, the 21 Mosaic valve seated in the annulus well.  Then #2  Tycron pledgeted sutures with pledgets on the ventricle surface were placed  circumferentially through the annulus, and the valve was seated.  Using felt  strips and a horizontal mattress sutures, the aortotomy was closed, starting  at the depth of the aortotomy on the right side of the aorta.  Before  complete closure, all loose calcific debris was inspected for and removed.  The aortotomy was then completed.  The heart was allowed to passively fill  and deair, as they aortotomy was completed.  A second layer of running, open  4-0 Prolene was placed for further closure of the aortotomy.  Bio-glue was  then placed on the closure following this.  The aortic cross clamp  was then  removed with the total cross clamp time of 86 minutes.  The patient required  electrodefibrillation to return to a sinus rhythm.  She was atrial paced to  increase a rate.  A 16 gauge needle was introduced in the left ventricular  apex to further deair the heart.  With the patient's body temperature  rewarmed at 37 degrees, she was then ventilated and weaned for  cardiopulmonary bypass.  She was decannulated in the usual fashion.  Protamine sulfate was administered with the operative field hemostatic, two  atrial tubes and ventricular pacing wires were applied.  The patient  remained hemodynamically stable.  Two mediastinal tubes were placed.  The  pericardium was reapproximated.  The sternum was closed with a #6 stainless  steel wire.  The fascia closed with interrupted 0 Vicryl and running 3-0  Vicryl and subcutaneous tissue.  A 4-0 subcuticular stitch in the skin  edges.  Dry dressings were applied.  Sponge and needle count was reported  correct at the completion of the procedure.  The patient tolerated the  procedure without obvious complication and was transferred to the surgical  intensive care unit for further postoperative care.      EG/MEDQ  D:  02/25/2005  T:  02/25/2005  Job:  [id number]   cc:   Colleen Can. Deborah Chalk, M.D.  Fax: [phone]   Danice Goltz, M.D. [facility]

## 2011-03-23 NOTE — Op Note (Signed)
[name], [name] NO.:  [id number]   MEDICAL RECORD NO.:  [id number]          PATIENT TYPE:  OIB   LOCATION:  3735                         FACILITY:  [facility]   PHYSICIAN:  Duke Salvia, MD, FACCDATE OF BIRTH:  08-27-1924   DATE OF PROCEDURE:  09/18/2006  DATE OF DISCHARGE:  09/19/2006                               OPERATIVE REPORT   PREOPERATIVE DIAGNOSIS:  Atrial flutter.   POSTOPERATIVE DIAGNOSIS:  Atypical atrial flutter.   PROCEDURE:  Invasive electrophysiological study and arrhythmia  mapping/electroanatomical mapping and catheter ablation x2.   After obtaining informed  consent, the patient was brought to the  electrophysiology laboratory and placed on the fluoroscopic table in the  supine position.  After routine prep and drape, cardiac catheterization  was performed on anesthesia and conscious sedation.  Noninvasive blood  pressure monitoring, transcutaneous oxygen saturation monitoring and end  tidal CO2 monitoring were performed continuously throughout the  procedure.  Following the procedure, the catheter was removed,  hemostasis was obtained and the patient transferred to the floor in  stable condition.   The catheter was inserted in the left femoral vein in the AV junction.  A 9-French _______ catheter was inserted in the left femoral vein to the  right atrium.  A 6-French octapolar catheter was inserted in the right  femoral vein to the coronary sinus.  An 8-French 8 mm and an 8-French 4  mm selectable tip ablation catheter was inserted through the right  femoral vein to mapping the sites in the right atrium.   _______ were monitored continuously throughout the procedure.  Upon  insertion of the catheters, the stimulation protocol including  incremental atrial pacing, incremental ventricular pacing, burst atrial  pacing.   RESULTS:  Unfortunately, these results are not outlined in the chart  except to say that the patient was in atrial  flutter with an RR interval  of 907 milliseconds and a QRS duration of 91 milliseconds preprocedure,  and the patient was in sinus rhythm at the end of the procedure.   I further _______ cycle length for 600 milliseconds and antegrade  conduction was continuous and retrograde conduction demonstrated no  accessory pathway.   ARRHYTHMIAS:  Electrogram mapping demonstrated counterclockwise rotation  around the tricuspid annulus.  The atrial flutter cycle length was 221  milliseconds.  Ablation was initially applied across the cava and  tricuspid isthmus, during which time we created conduction block,  unfortunately, the tachycardia persisted.  We then started mapping the  tachycardia higher up and found that it emerged from the crista.  It was  terminated with catheter manipulation.  At this point, a line was drawn  between the confluence of electrograms that were seen down to the  tricuspid annulus using the ray as a map.  Tachycardia was not inducible  thereafter.   Fluoroscopy time in total was 27 minutes 32 seconds.  Fluoroscopy time  was utilized at 7-1/2 to 15 frames per second.   _______ total of 36 minutes and 47 seconds and _______ was applied.   IMPRESSION:  Atypical atrial flutter was identified and it  appeared to  be originating out of the high rate atrium in the vicinity of the crista  terminalis.  A line of block was drawn from the crista terminalis to the  tricuspid annulus to eliminate the substrate of the tachycardia.   The patient tolerated the procedure without apparent complications.      Duke Salvia, MD, [facility]  Electronically Signed     SCK/MEDQ  D:  02/12/2007  T:  02/12/2007  Job:  [id number]   cc:   Dr. Corey Harold  Electrophysiology Laboratory  [facility]

## 2011-03-23 NOTE — Discharge Summary (Signed)
[name], [name]               ACCOUNT NO.:  [id number]   MEDICAL RECORD NO.:  [id number]          PATIENT TYPE:  INP   LOCATION:  2027                         FACILITY:  [facility]   PHYSICIAN:  Sheliah Plane, MD    DATE OF BIRTH:  09/15/1924   DATE OF ADMISSION:  02/23/2005  DATE OF DISCHARGE:                                 DISCHARGE SUMMARY   ADDENDUM:  The patient was cardioverted on Mar 09, 2005 by Dr. Roger Shelter.  This  was successful and the patient has maintained a normal sinus rhythm with  intermittent junctional rhythm since that time.  The remainder of her  hospital stay has remained stable and unchanged.  On Mar 12, 2005 the patient  is afebrile with stable vital signs and maintaining a normal sinus rhythm.  Secondary to an elevated INR due to her Coumadin she has been held over the  weekend to monitor her rhythm and Coumadin level.  This is currently  trending down to an appropriate level for discharge.   PHYSICAL EXAMINATION:  CARDIAC:  Regular rate and rhythm.  LUNGS:  Decreased breath sounds throughout, otherwise clear.  ABDOMEN:  Benign.  EXTREMITIES:  There is no edema present in the extremities and the incisions  are clean, dry, and intact.   Patient is stable status post cardioversion and aortic valve replacement.  Her Coumadin will be restarted prior to discharge and she will be instructed  to continue working on her pulmonary toilet and will be discharged on her  home O2.   LABORATORIES:  INR on Mar 12, 2005 2.7.      AY/MEDQ  D:  03/12/2005  T:  03/12/2005  Job:  [id number]

## 2011-03-23 NOTE — Op Note (Signed)
NAMEMORGYN, MARUT               ACCOUNT NO.:  [id number]   MEDICAL RECORD NO.:  [id number]          PATIENT TYPE:  INP   LOCATION:  2027                         FACILITY:  [facility]   PHYSICIAN:  Colleen Can. Deborah Chalk, M.D.DATE OF BIRTH:  [birth year]   DATE OF PROCEDURE:  03/09/2005  DATE OF DISCHARGE:                                 OPERATIVE REPORT   PROCEDURE:  Cardioversion.   ANESTHESIA:  Per Dr. Autumn Patty.   DESCRIPTION OF PROCEDURE:  Using anterior posterior pads, 200 watts seconds  biphasic energy delivered with conversion to normal sinus rhythm.  The  patient tolerated the procedure well.      SNT/MEDQ  D:  03/09/2005  T:  03/09/2005  Job:  [id number]

## 2011-03-23 NOTE — Assessment & Plan Note (Signed)
[facility]                             PULMONARY OFFICE NOTE   NAME:[name], [name]                     MRN:          [id number]  DATE:02/13/2007                            DOB:          Feb 01, 1924    PROBLEM:  This is an 77 year old woman seeking to establish with me for  management of ectopic asthma with COPD and allergic rhinitis.  She was  being followed previously by Dr. Jayme Cloud.  She works with Dr. Alwyn Ren  for primary care, Dr. Delfin Edis for cardiology, and Dr. Annalee Genta for  ENT.  Problems have included asthma with COPD, allergic rhinitis, aortic  valve replacement with atrial fibrillation on Coumadin, and obstructive  sleep apnea for which she occasionally uses CPAP set at 10 CWP.  She  complains of perinneal postnasal drip, nasal congestion and sneezing.  When pine needle mulch was put out around her home recently she  experiencing a distinct exacerbation of shortness of breath for about 2  days.  She had been on allergy vaccine in the remote past with Dr.  Stevphen Rochester and says that since she came off of vaccine symptoms have  been more difficult to control.  She is interested in restarting.   MEDICATION:  1. Home nebulizer with Xopenex 1.25 mg.  2. Potassium 20 mEq b.i.d.  3. Mucinex 600 mg b.i.d.  4. Furosemide 40 mg.  5. Singulair 10 mg.  6. Zoloft 50 mg.  7. Digitek 125 mcg.  8. Multivitamins.  9. Coumadin.  10.Zegerid 40 mg.  11.Zetia 10 mg.  12.Cardizem 240 mg.  13.Oxygen at 2 to 2.5L.  14.Nasarel nasal spray.  15.Symbicort 160/4.5.  16.Xyzal 5 mg.  17.Xopenex HFA inhaler 2 puffs q.i.d. p.r.n.  18.CPAP at 10 CWP.   DRUG INTOLERANT:  1. AVELOX, WITH RASH.  2. CODEINE.  3. SULFA.  4. MYCINS.  5. HYDROCORTISONE (?).  6. CEPHALEXIN.  7. ESTRACE.  8. ANALPRAM.  9. PROMETHAZINE WITH CODEINE.  10.DOXYCYCLINE.   REVIEW OF SYSTEMS:  Nasal congestion and postnasal drip with sneezing,  chest tightness, shortness of  breath, cough and wheeze, little sputum,  no chest pain, no purulent or bloody discharge.   PAST HISTORY:  1. Atrial fibrillation and aortic valve replacement in 2006 with      chronic Coumadin.  2. Spine surgery.  3. Medical treatment for her rhinitis and asthma with COPD.  4. Elevated cholesterol.  5. Obstructive sleep apnea on CPAP at 10 CWP which she uses      occasionally.  6. She had a porcine heart valve and stent in April of 2006 by Dr.      Tyrone Sage.  7. She has had GI evaluation by Dr. Marina Goodell for reflux and irritable      bowel with diverticulosis and a cecal arteriovenous malformation.  8. Pulmonary function testing on December 11, 2005, showed mild      obstructive airways disease with an FEV1 of 1.38 (85% of      predicted), but FEV1/FVC ratio of 0.51, suggesting more moderate      severity.  There was limited response  to bronchodilator and small      airway flows.  Measured lung volumes were normal, diffusion was      reduced at 81%, suggesting an emphysema component.  9. RAST testing in February of 2006 indicated a relatively elevated      total IgE at 152 without specific antigen-directed IgE elevations      identified.   OBJECTIVE:  Weight 140 pounds, BP 128/62, pulse slightly irregular at  78, oxygen saturation 96% on 2.5L.  There is a grade 1/6 systolic murmur  with a crisp valve closure noise.  Breath sounds were coarse without  real wheeze or rhonchi.  I found no adenopathy, neck vein distention or  edema.  Palate length was 3/4, partly obscuring the posterior pharynx,  but no pharyngeal erythema or postnasal drip was seen.  Turbinates were  edematous.   IMPRESSION:  1. Allergic rhinitis, perinneal.  2. Asthma with chronic obstructive pulmonary disease including mostly      emphysema in this woman who apparently still smokes some but also      with an atopic component.  3. Mild obstructive sleep apnea (index 14 per hour) with incomplete      use of continuous  positive airway pressure.  At age 32, the      benefits of this therapy can be discussed further going forward.   PLAN:  1. We discussed environmental precautions and smoking cessation.  2. Return for allergy testing, as she requests.  3. Basics of sleep hygiene and a very preliminary discussion of sleep      apnea.     Clinton D. Maple Hudson, MD, [facility], FACP  Electronically Signed    [name]/MedQ  DD: 02/16/2007  DT: 02/16/2007  Job #: [id number]   cc:   Titus Dubin. Alwyn Ren, MD,FACP,FCCP

## 2011-03-23 NOTE — Discharge Summary (Signed)
   NAME:  [name], [name]                        ACCOUNT NO.:  [id number]   MEDICAL RECORD NO.:  [id number]                   PATIENT TYPE:  INP   LOCATION:  3731                                 FACILITY:  [facility]   PHYSICIAN:  Gordy Savers, M.D. [facility]      DATE OF BIRTH:  Aug 11, 1924   DATE OF ADMISSION:  06/16/2002  DATE OF DISCHARGE:                                 DISCHARGE SUMMARY   FINAL DIAGNOSIS:  New onset atrial fibrillation.   ADDITIONAL DIAGNOSIS:  Chronic obstructive pulmonary disease.   DISCHARGE MEDICATIONS:  1. Combivent 2 puffs q. 6 hours.  2. Protonix 40 mg daily.  3. Ambien 5 mg h.s. p.r.n. sleep.  4. Diltiazem extended released 180 mg daily.   HISTORY OF PRESENT ILLNESS:  The patient is a 77 year old black female  admitted from the office for evaluation and treatment of new onset atrial  fibrillation.   HOSPITAL COURSE:  The patient was admitted to the telemetry setting where  she was initially placed on IV heparin and IV Cardizem for rate control.  The patient converted to a normal sinus rhythm and IV Cardizem was  discontinued and the patient was switched to oral Diltiazem.  Hospital  course was marked by persistent shortness of breath and during the hospital  period was treated with Tequin as well as progressive pulmonary toilet.  Cardiac enzymes were obtained.  As this patient remained in normal sinus  rhythm for several days, IV heparin was discontinued.  During the remainder  of the hospital period the patient remained in normal sinus rhythm.  A 2-D  echocardiogram was obtained.   LABORATORY STUDIES:  Serial cardiac enzymes were unremarkable.  Thyroid  function studies likewise normal with a TSH of 2.385.  White count was  normal at 8.3.  H&H 12.8 and 38.9.  Chemistries were normal.  Blood sugar  was 122.  During the hospital period serial PTTs were monitored.  A 2-D  echocardiogram was obtained to assess LV function.  The study was inadequate  for evaluation of LV regional wall motion.  There is mild mitral annular  calcification.  Although the images were technically difficult it was felt  that the left ventricle was normal with intact LV function.   DISPOSITION:  The patient will be discharged on a medical regimen listed  above.  She has been asked to follow up with her primary care [name]  within the next three or four days.   CONDITION ON DISCHARGE:  Improved.                                               Gordy Savers, M.D. [facility]    PFK/MEDQ  D:  06/18/2002  T:  06/22/2002  Job:  [id number]

## 2011-03-23 NOTE — Discharge Summary (Signed)
[name], [name]               ACCOUNT NO.:  [id number]   MEDICAL RECORD NO.:  [id number]          PATIENT TYPE:  INP   LOCATION:  3704                         FACILITY:  [facility]   PHYSICIAN:  Colleen Can. Deborah Chalk, M.D.DATE OF BIRTH:  1924-03-26   DATE OF ADMISSION:  12/12/2004  DATE OF DISCHARGE:  12/22/2004                                 DISCHARGE SUMMARY   DISCHARGE DIAGNOSES:  1.  Atrial fibrillation/flutter, now maintained in normal sinus rhythm on      Rhythmol.  2.  Severe chronic obstructive pulmonary disease with recurrent exacerbation      with actual improvement on pulmonary function tests with a previous      forced expiratory volume in one second (FEV1) of 63% in December 2004,      now up to 88% in February 2006.  3.  Severe aortic stenosis.  4.  Hypokalemia, now stable on replacement therapy.  5.  Hiatal hernia/gastroesophageal reflux disease, stable on Nexium.  6.  Irritable bowel syndrome.  7.  Hemorrhoidal bleeding.  8.  History of hypertension, currently controlled.   HISTORY OF PRESENT ILLNESS:  The patient is a very pleasant, 77 year old  female who has multiple medical problems.  She has had a significant bout of  viral bronchitis since December.  She has been previously treated with two  rounds of antibiotics as well as subsequent prednisone therapy.  On the day  of February 7, she was participating in pulmonary rehabilitation when she  was noted to have an increase in her heart rate.  She was somewhat dizzy,  but basically otherwise was asymptomatic and had no awareness of the  tachycardia.  She was subsequently admitted for rate control and  anticoagulation.  She was significantly short of breath with just minimal  exertion.   Please see dictated History and Physical for further patient presentation  and profile.   LABORATORY DATA AND X-RAY FINDINGS:  On admission, white count was 12.6,  hematocrit 34.  Chemistries showed a potassium of 3.2, BUN 10,  creatinine  0.8, glucose 103.  LFTs were unremarkable.  Cardiac enzymes were negative.  Magnesium was 2.1.  TSH was normal.  BNP was 240.   Chest x-ray showed mild cardiomegaly with COPD and no acute changes.  A 12-  lead electrocardiogram showed atrial fibrillation with a rapid ventricular  response and nonspecific changes.   HOSPITAL COURSE:  The patient was admitted for IV heparin as well as IV  Cardizem.  She was begun on digoxin therapy as well.  On December 13, 2004,  she was subsequently started on Betapace in order to try and convert her to  normal sinus rhythm.  She was felt to be a poor candidate with amiodarone  secondary to her severe COPD.  Her potassium was replaced accordingly.  The  following morning, she converted to sinus rhythm and did have a significant  3.52 second pause upon conversion.  She subsequently was noted to have sinus  bradycardia at a heart rate in the low 50s and it was felt at this time that  the patient would not  be able to tolerate her Betapace due to significant  slowing and then with worsening shortness of breath.  Her digoxin was  subsequently held.  She was begun on Coumadin anticoagulation empirically.   Throughout the remainder of her course, the patient was very slow to  stabilize.  She continued to have significant shortness of breath.  Xopenex  nebulizers as well as her significant pulmonary regimen was continued.  She  was subsequently started back on antibiotics and pulmonary consultation was  called for during this admission as well.  There is concern about the aortic  stenosis and whether or not she is actually a satisfactory candidate for  aortic valve replacement.   She was seen in consultation by Dr. Danice Goltz and had CT of the sinuses  performed as well as a full set of PFTs.  It was felt that this most recent  exacerbation was secondary to sinus disease and antibiotics, as well as  nasal hygiene, needed to be continued.  Her  PFTs were noted to show  improvement of her FEV1 from previously 63%, now up to 88%.  It was Dr.  Georgann Housekeeper feelings that as far as a surgical intervention may be  entertained she was at moderate risk from a pulmonary standpoint and this  risk could not be further minimized given the fact that she is currently  optimally treated from a pulmonary standpoint.   The patient's rhythm was slow to improve, however, there was felt to be  concern that she would have recurrent atrial fibrillation.  She was  subsequently started on Rhythmol at a low dose and this was tolerated quite  well.  Her Nancie Neas was subsequently discontinued and she has maintained  adequate rate control.  Her COPD status is basically stabilized and her  activity has been increased and tolerated without problems.  We have been  able to stop her Coumadin.  She has had problems with bloody nasal drainage  as well as bleeding from her hemorrhoids and given the fact that she has  been maintained in sinus rhythm, we will continue with aspirin  anticoagulation solely.   Today, on December 22, 2004, she is basically doing well.  She has been up  and ambulatory in her room with just minimal difficulty.  Her overall  strength has improved.  Her rhythm has remained stable and she is felt to be  a satisfactory candidate for discharge today.   DISCHARGE LABORATORY DATA AND X-RAY FINDINGS:  INR 2.1, despite no Coumadin  over the last 4 days.  BNP is 59.  CBC with white count 9000, hematocrit 34.  Chemistries normal.   CONDITION ON DISCHARGE:  Stable.   DISCHARGE MEDICATIONS:  1.  Stop Cartia as well as Lescol.  2.  Rhythmol SR 225 mg b.i.d.  3.  Baby aspirin daily.  4.  Avelox x3 more days at 400 mg q.d.  5.  Potassium 20 mEq daily as well.  6.  Zoloft.  7.  Lasix.  8.  Spiriva.  9.  Nexium.  10. Mucinex.  11. Singulair.  12. Vitamins.  13. Nebulizer treatments.  ACTIVITY:  As tolerated.   FOLLOW UP:  We will plan on  seeing her back in the office in approximately  10 days.  Once she is over this current bout of COPD exacerbation and  stabilizes, we will need to reassess her degree of valvular disease for the  possibility of possible aortic valve replacement in the future.  LC/MEDQ  D:  12/22/2004  T:  12/22/2004  Job:  [id number]   cc:   Titus Dubin. Alwyn Ren, M.D. [facility]   Danice Goltz, M.D. [facility]

## 2011-03-23 NOTE — H&P (Signed)
[name], House NO.:  [id number]   MEDICAL RECORD NO.:  [id number]          PATIENT TYPE:  OIB   LOCATION:  3735                         FACILITY:  [facility]   PHYSICIAN:  Duke Salvia, MD, FACCDATE OF BIRTH:  September 13, 1924   DATE OF ADMISSION:  09/18/2006  DATE OF DISCHARGE:                                HISTORY & PHYSICAL   CARDIOLOGIST:  Dr. Roger Shelter   ELECTROPHYSIOLOGIST:  Dr. Sherryl Manges   PRIMARY CAREGIVER:  Dr. [name].   ALLERGIES:  AVELOX, CEPHALEXIN, CODEINE, DOXYCYCLINE, ESTERASE,  HYDROCORTISONE, SULFA, PROMETHAZINE AND TWO OTHERS I CANNOT READ; I AM  SORRY.   PRESENTING CIRCUMSTANCE:  My heart has been working double time.   HISTORY OF PRESENT ILLNESS:  [name] is an 77 year old female with a  history of aortic stenosis.  She underwent aortic valve replacement with a  porcine valve by Dr. Tyrone Sage in April 2006.  There was no coronary artery  disease diagnosed at that time; however, she has been bothered by atrial  flutter since her surgery.  She has failed DC cardioversion x2, Rythmol was  tried and subsequently stopped.   She has oxygen-dependent COPD secondary to bronchospastic lung disease and  emphysema.  She wears oxygen 24/7.  Atrial flutter leaves her weaker, less  likely to tolerate exercise.  The patient is not always aware of atrial  dysrhythmias.  On an office visit on September 03, 2006, to Dr. Graciela Husbands in  consultation an electrocardiogram demonstrated ATYPICAL atrial flutter, but  the patient was unaware of palpitations or heart racing.  The patient says  her heart races inappropriately, however, to her exercise level when at  cardiac rehab.   MEDICATIONS:  1. Spiriva 1 puff daily.  2. Xopenex 1.25 mg nebulizer therapy 4 times daily.  3. K-Dur 20 mEq twice daily.  4. Mucinex 600 mg twice daily.  5. Furosemide 40 mg daily.  6. Singulair 10 mg daily.  7. Zoloft 50 mg daily.  8. Cardizem CD 300 mg daily.  9. Digitek 0.125 mg daily.  10.Multivitamin 1 tab daily.  11.Coumadin, the pharmacy will control this during this hospitalization.  12.Zetia 10 mg daily.  13.Oxygen, 2 liters via nasal cannula 24/7.   PAST MEDICAL HISTORY:  Includes:  1. Aortic stenosis status post aortic valve replacement April 2006.  2. Symptomatic atrial flutter, atypical.  3. Emphysema, oxygen 24/7.  4. GERD.  5. Degenerative joint disease.  6. Anxiety/depression.  7. Normal ventricular function with no coronary artery disease diagnosed      at the time of her aortic valve replacement.  8. Hypertension.  9. Dyslipidemia.   SOCIAL HISTORY:  The patient lives in [place] with her husband.  She no  longer smokes, does not take alcoholic beverages or drugs.   REVIEW OF SYSTEMS:  The patient is not complaining of fevers, chills, night  sweats.  She has no significant weight gain or loss in the last year.  She  has no epistaxis, vertigo or photophobia.  She has no rashes or nonhealing  ulcerations of the lower extremities.  She is not  complaining of chest pain.  She does have dyspnea on exertion, no orthopnea, no paroxysmal nocturnal  dyspnea.  She has no history of presyncope or syncope.  She does admit to  occasional palpitations.  She has no urinary complaints.  She does  experience gastroesophageal reflux, but has no history of GI bleeding.  She  has arthralgias with minor deformities from arthritis, no neurological  deficits.  Patient's past medical history does not include myocardial  infarction, diabetes, thyroid problems, cerebrovascular accident.   PHYSICAL EXAMINATION:  GENERAL:  Alert and oriented x3, no acute distress,  wearing oxygen two liters.  VITAL SIGNS:  Temperature 97.7, blood pressure 132/66, pulse is 73 and  regular, respirations 18, oxygen saturation is on two liters of oxygen 96%.  HEENT:  Eyes:  Pupils equal, round and reactive to light.  Extraocular  movements are intact.  NECK:   Supple.  There is a referred murmur from the chest, right greater  than left.  HEART:  Regular rate and rhythm with a grade III/VI systolic murmur.  LUNGS:  Quiet, no rales auscultated.  ABDOMEN:  Mild protrusion, bowel sounds are present, no guarding and no pain  on rebound.  EXTREMITIES:  Only mild dependent edema.  NEUROLOGIC:  No deficits.   PLAN:  ESI mapping/radiofrequency catheter ablation of atypical atrial  flutter.      [name], [place]      Duke Salvia, MD, [facility]  Electronically Signed    GM/MEDQ  D:  09/18/2006  T:  09/18/2006  Job:  [id number]

## 2011-03-23 NOTE — Letter (Signed)
December 18, 2006    Colleen Can. Deborah Chalk, M.D.  [address]  [place], [place] [address]   RE:  [name], [name]  MRN:  [id number]  /  DOB:  Jun 08, 1924   Dear Weyman Croon:   [name] came in today.  After you saw her on Monday, she had an  interesting experience where when they came to deliver pine needles to  her house, and she was out hovering around in that.  When she went in  she had a worse problem with her shortness of breath.  It was her  impression that this may in fact represent a lot more of her lungs than  it is her heart.   Her medication list was noted today to be legion again with a notable  change in her Cardizem being 300 to 240.   EXAMINATION:  Her blood pressure is 125/67 with a pulse of 69.  LUNGS:  Clear.  HEART:  Sounds were irregular.  EXTREMITIES:  Had trace edema.   I subjected her to a treadmill on a modified Naughton protocol, and  looked to see what her rate excursion was.  At about 2 minutes and 30  seconds she had achieved a maximal heart of about 120, but it did not  sustain there, it actually sustained in the 100 to 110 range, which I  thought was pretty rate control.   IMPRESSION:  1. Atrial flutter - left sided with an ablation of a crystal      tachycardia.  2. Oxygen dependent chronic obstructive pulmonary disease.  3. Aortic stenosis, status post aortic valve replacement.  4. Previously normal left ventricular function.   As we talked Weyman Croon, and the story today reinforcing that, it may be that  this is not all her heart doing this.  She was interested in seeing just  how things went from a pulmonary point of view.   She is to follow up with you as scheduled.  If I can be of any further  assistance down the road, please do not hesitate to contact me.    Sincerely,      Duke Salvia, MD, [facility]  Electronically Signed    SCK/MedQ  DD: 12/18/2006  DT: 12/18/2006  Job #: [id number]

## 2011-03-23 NOTE — Discharge Summary (Signed)
[name], [name]               ACCOUNT NO.:  [id number]   MEDICAL RECORD NO.:  [id number]          PATIENT TYPE:  INP   LOCATION:  2027                         FACILITY:  [facility]   PHYSICIAN:  Sheliah Plane, MD    DATE OF BIRTH:  02-08-1924   DATE OF ADMISSION:  02/20/2005  DATE OF DISCHARGE:  03/11/2005                                 DISCHARGE SUMMARY   PRIMARY ADMITTING DIAGNOSIS:  Critical aortic stenosis.   ADDITIONAL/DISCHARGE DIAGNOSES:  1.  Critical aortic stenosis.  2.  Chronic obstructive pulmonary disease requiring home oxygen.  3.  Postoperative atrial fibrillation.  4.  Gastroesophageal reflux disease.  5.  Hypertension.  6.  Hyperlipidemia.  7.  Hiatal hernia.  8.  History of irritable bowel syndrome.  9.  Tracheobronchitis postoperatively.  10. Postoperative anemia.  11. Congestive heart failure.   PROCEDURES PERFORMED:  1.  Cardiac catheterization.  2.  Aortic valve replacement with Medtronic Mosaic Porcine aortic valve.  3.  Direct current cardioversion.   HISTORY:  The patient is a 77 year old female who has been followed for  several years by Dr. Deborah Chalk with aortic stenosis.  She has recently had  worsening symptoms of congestive heart failure and worsening aortic stenosis  by echocardiogram.  Because of her worsening symptoms, she was brought in  for elective cardiac catheterization on February 20, 2005.  She was found to  have severe aortic stenosis by right heart cath with no significant coronary  disease.  Because of her worsening symptoms and her progressive aortic valve  disease, aortic valve replacement was recommended at this point.  Dr.  Tyrone Sage saw the patient in consultation for cardiothoracic surgery and  explained the risks, benefits and alternatives of the procedure to the  patient.  She agreed to proceed with surgery.   HOSPITAL COURSE:  The patient was admitted following her catheterization.  She remained stable in-house prior to  surgery.  She was taken to the  operating room on February 23, 2005 and underwent aortic valve replacement by  Dr. Tyrone Sage as described in detail above.  She tolerated the procedure well  and was transferred to the SICU in stable condition.  She was able to be  extubated shortly after surgery.  She was mildly hypotensive on  postoperative day one, but otherwise was doing well.  She was slowly  mobilized.  Her hemodynamic monitoring devices and chest tubes were removed.  On postoperative day two she was started on a diuretic as her blood  pressures had improved.  She was also started on Coumadin for her valve and  was able to be transferred to the floor later that day.  She was noted to be  anemic with hemoglobin 8.3 and required a transfusion of one unit of packed  red blood cells.  Late in the day postop day three she developed rapid  atrial fibrillation with rates up to the 120s.  She had previously been on  Rythmol for several months.  She was initially started on IV amiodarone and  her Rythmol dose was increased.  She was also started on a  low dose beta-  blocker.  She did have a bradycardic episode and her amiodarone and  Lopressor were discontinued.  She was started on IV Cardizem.  She had  episodes of sinus rhythm, but mostly stayed in atrial fibrillation with  rates in the 90s to 100.  The Cardizem was discontinued and she was  maintained solely on Rythmol and Coumadin.  Her blood pressure started to  trend upward and she was started on an ACE inhibitor.  Her heart rate  continued to trend upward episodically and she was started on Digoxin.  Dr.  Deborah Chalk followed the patient throughout her postoperative course and because  of her persistent atrial fibrillation, it was felt that she would require  direct current cardioversion.  This was performed on Mar 08, 2005.  The  patient converted to normal sinus rhythm without problem.  During her  postoperative course she was volume overloaded  and was started on aggressive  diuresis.  Also she was started on aggressive pulmonary toilet measures.  She developed some discolored sputum and had some slight elevation in her  white blood cell count to 11,000.  Because of this she was started  empirically on antibiotic coverage for a presumed tracheobronchitis.  She  has now completed a seven day course of antibiotics and her cough has  improved significantly.  Dr. Jayme Cloud had followed the patient  postoperatively and her COPD is back at baseline.  She is maintaining O2  sats of greater than 90% on two liters as at home.  She was also started on  iron supplementation for her anemia, and this was discontinued secondary to  nausea.  She is making slow and steady progress.  It is anticipated if she  continues to do well over the next 48 hours, she will hopefully be ready for  discharge home possibly by Mar 11, 2005.  Her INR currently is  supratherapeutic at 3.7 and her Coumadin is being held.  Her other labs are  stable with a sodium of 126, potassium 3.6 which has been supplemented, BUN  10, creatinine 1.0, white count 13,000, hemoglobin 9.7, hematocrit 29, and  platelets 423.  Her most recent chest x-ray showed small bilateral pleural  effusions with basilar subsegmental atelectatic changes.  She is ambulating  with cardiac rehab phase one and is making progress, but is still somewhat  deconditioned.  She is tolerating a regular diet and is having normal bowel  and bladder function.  Her surgical incision sites are all healing well.  She is still somewhat volume overloaded.  She will have a follow-up chest x-  ray on Mar 10, 2005.  Hopefully she will be ready for discharge home on Mar 11, 2005 if she remains stable.   DISCHARGE MEDICATIONS:  1.  Coumadin, home dose will be determined by PT/INR drawn on the day of      discharge.  2.  Rythmol SR 225 mg q.8h.  3.  Altace 2.5 mg daily.  4.  Lanoxin 0.125 mg daily.  5.  Lasix 40 mg  daily x2 weeks. 6.  K-Dur 20 mEq daily x2 weeks.  7.  Nexium 40 mg daily.  8.  Zoloft 50 mg daily.  9.  Singulair 10 mg daily.  10. Spiriva as directed.  11. Albuterol as directed.  12. Atrovent as directed.  13. Ultram 50-100 mg q4-6 hours p.r.n. for pain.   DISCHARGE INSTRUCTIONS:  She is asked to refrain from driving, heavy lifting  or strenuous activity.  She may continue ambulating daily and using her  incentive spirometer.  She will continue home O2 as directed.  She will  continue a low fat, low sodium diet.  She may shower daily and clean her  incisions with soap and water.   DISCHARGE FOLLOWUP:  She will have PT/INR drawn approximately 48 hours from  the time of discharge by her home health nurse.  The results will be called  to Dr. Angelina Pih office for management of her Coumadin.  She will  then follow up with Dr. Deborah Chalk in two weeks and have a chest x-ray at that  visit.  She will see Dr. Tyrone Sage Mar 29, 2005 at 3:30 p.m.  In the interim  if she has problems or questions, she is asked to contact our office  immediately.  She is also asked to follow up as directed with Dr. Jayme Cloud.      GC/MEDQ  D:  03/09/2005  T:  03/10/2005  Job:  [id number]   cc:   Colleen Can. Deborah Chalk, M.D.  Fax: [phone]   Danice Goltz, M.D. [facility]   Titus Dubin. Alwyn Ren, M.D. [facility]

## 2011-03-23 NOTE — Assessment & Plan Note (Signed)
[facility]                           GASTROENTEROLOGY OFFICE NOTE   NAME:WHITCOMBCamisha, Srey                     MRN:          [id number]  DATE:08/15/2006                            DOB:          11-01-1924    HISTORY:  This is an 77 year old female with multiple medical problems who  schedules herself for an office visit with multiple questions and  complaints.  Her medical history is remarkable for chronic obstructive  pulmonary disease for which she is oxygen dependent, aortic valve disease  for which she is status post aortic valve replacement in April 2006, atrial  fibrillation for which she has been on Coumadin, hyperlipidemia,  gastroesophageal reflux disease, and a remote history of colon polyps.  She  was evaluated in this office June 26, 2005 regarding iron-deficiency  anemia.  She was felt most likely to have chronic gastrointestinal blood  loss from previously documented colonic arteriovenous malformations and that  the blood loss was likely accelerated by a combination of aspirin and  Coumadin.  Due to her advanced age and advanced medical conditions,  diagnostic endoscopic procedures were felt to be too high risk.  She was  placed on high-dose iron, MiraLax for constipation, and returned to the care  of her physicians.  Her first question today is that of surveillance  colonoscopy.  I reinforced to her again that she is not an appropriate  candidate at this time for reasons mentioned.  She is having no obvious GI  bleeding.  Constipation is well controlled on stool softener and Amitiza.  She is no longer taking MiraLax or Metamucil.  Next, she was changed from  Nexium to Zegerid for her reflux.  This due to cost-related issues.  She is  doing well on Zegerid.  No dysphagia.  Complains of bloating and belching  and burping.  She feels this is worse after nebulizer treatment.  She also  inquires about the need to stay on Zoloft.  Her  irritable bowel syndrome has  been treated nicely with this in the past.  She is having no side effects  from the drug.   CURRENT MEDICATIONS:  Spiriva, Xopenex, potassium chloride, Mucinex,  furosemide, Singulair, Zoloft, Cardizem, Digitek, multivitamin, Coumadin,  oxygen, and Zegerid.   PHYSICAL EXAMINATION:  GENERAL:  Chronically ill elderly female in no acute  distress.  VITAL SIGNS:  Blood pressure is 104/52, heart rate 76 and irregular, weight  is 140 pounds.  HEENT:  Sclerae are anicteric.  Oral mucosa intact.  CHEST:  Lungs reveal distant breath sounds but are otherwise clear.  CARDIAC:  Irregular.  ABDOMEN:  Protuberant and soft without significant tenderness.  No mass.  Small umbilical hernia present.  Good bowel sounds.  EXTREMITIES:  Without edema.   IMPRESSION:  This is an 77 year old female with multiple significant medical  problems.  Gastrointestinal diagnoses include gastroesophageal reflux  disease which is stable on Zegerid, constipation predominant irritable bowel  for which she is now on Amitiza stool softeners and Zoloft, and belching  likely due to aerophagia.   RECOMMENDATION:  At this point I would continue her GI  medications without  change.  Again, no indication for invasive procedures.  It appears that she  is off iron.  Should she become anemic again, I would recommend resuming  iron therapy.  GI followup as needed.            ______________________________  Wilhemina Bonito. Eda Keys., MD      JNP/MedQ  DD:  08/15/2006  DT:  08/16/2006  Job #:  [id number]   cc:   Colleen Can. Deborah Chalk, M.D.  Titus Dubin. Hopper, MD,FACP,FCCP  C. Danice Goltz, MD

## 2011-03-23 NOTE — Assessment & Plan Note (Signed)
[facility]                             PULMONARY OFFICE NOTE   NAME:WHITCOMBKatha, House                     MRN:          [id number]  DATE:10/08/2006                            DOB:          [birth year]    Please note that the patient's chart was not available at the time of  her evaluation.   The patient is an 77 year old white female who presents for followup on  chronic obstructive pulmonary disease with asthmatic bronchitic  component. The patient presents today after noting that she has had an  ablation on September 18, 2006. She is currently enrolled in pulmonary  rehabilitation however had to put this on hold during the time of the  ablation.   The patient does complain bitterly of dyspnea on exertion complaining  that she becomes very fatigued and dizzy with exertion. She states  that she has no upper body strength.   CURRENT MEDICATIONS:  Symbicort, Spiriva, Cardizem, Digitek, furosemide,  Singulair, potassium chloride, Zetia, Coumadin, Veramyst, Zegerid and  Xopenex. The patient also is on oxygen 2 liters per minute.   PHYSICAL EXAMINATION:  VITAL SIGNS:  Noted. Oxygen saturation is 94% on  2 liters nasal cannula O2.  GENERAL:  This is a well-developed somewhat obese female who is in no  acute distress.  HEENT:  Unremarkable.  NECK:  Supple, no adenopathy noted, no JVD.  LUNGS:  She has no rhonchi, no wheezes and is actually remarkably clear  to auscultation.  CARDIAC:  Regular rate and rhythm. The patient has crisp valve sounds.  ABDOMEN:  Benign.  EXTREMITIES:  No cyanosis, no clubbing and no edema noted.   IMPRESSION:  1. Chronic obstructive pulmonary disease on the basis of asthmatic      bronchitis. The patient is actually fairly well compensated in this      regard.  2. Dyspnea which is multifactorial.   PLAN:  1. The patient is to continue pulmonary rehabilitation.  2. She is to continue her medications as they are. I did  give her the      go ahead of increasing her liter flow of oxygen to 3 liters with      ambulation and continue 2 liters at rest.  3. Followup will be in 3 months' time. She is to contact us prior to      that time should any problems arise.     [name], MD  Electronically Signed   CLG/MedQ  DD: 10/08/2006  DT: 10/09/2006  Job #: [id number]

## 2011-03-23 NOTE — H&P (Signed)
[name], [name]               ACCOUNT NO.:  [id number]   MEDICAL RECORD NO.:  [id number]          PATIENT TYPE:  OIB   LOCATION:                               FACILITY:  [facility]   PHYSICIAN:  Colleen Can. Deborah Chalk, M.D.DATE OF BIRTH:  Oct 28, 1924   DATE OF ADMISSION:  02/20/2005  DATE OF DISCHARGE:                                HISTORY & PHYSICAL   CHIEF COMPLAINT:  None.   HISTORY OF PRESENT ILLNESS:  [name] is a very pleasant 77 year old  female who has multiple medical problems.  She presents for elective cardiac  catheterization due to known progression of aortic stenosis.  She was  hospitalized in the earlier part of February with new onset of atrial  fibrillation/flutter.  She has been placed on Rhythmol with conversion to  sinus rhythm.  She has severe COPD, and is followed by Dr. Danice Goltz.  She has had previous bouts of bronchitis which has currently stabilized.  She has previously not had an ischemic evaluation;  however, her last 2-D  echocardiogram in December 2005, showed ejection fraction of 55 to 60%, the  aortic valve area was 0.6 sq/cm, with a mean gradient of 33, and a peak  gradient of 59.  She is now referred for left and right heart  catheterization in order to possibly proceed on with valve replacement as  well as the possibility for coronary artery bypass grafting as well.  Clinically, she has not had chest pain, she does have chronic shortness of  breath, she has had no frank syncope.   PAST MEDICAL HISTORY:  1.  Severe COPD, she does have O2 dependency.  2.  Atrial fibrillation/flutter, maintained in sinus rhythm on Rhythmol      since February 2006.  3.  History of severe aortic stenosis.  4.  Hiatal hernia.  5.  Gastroesophageal reflux disease.  6.  History of irritable bowel syndrome.  7.  Prior history of hemorrhoidal bleeding with Coumadin therapy.  8.  History of hypertension.  9.  History of hyperlipidemia, currently off of statin  therapy.   ALLERGIES:  No known drug allergies.   CURRENT MEDICATIONS:  1.  Baby aspirin daily.  2.  Rhythmol 225 mg b.i.d.  3.  Singular daily.  4.  Spiriva inhaler daily.  5.  Mucinex 600 mg b.i.d.  6.  Vitamin C daily.  7.  Protegra two tablets daily.  8.  Calcium daily.  9.  B vitamins daily.  10. Potassium 20 mEq daily.  11. Lasix 20 mg daily.  12. Zoloft 50 mg daily.  13. Nexium 40 mg daily.  14. Albuterol and Advair b.i.d.  15. Atrovent b.i.d.   FAMILY HISTORY:  Noncontributory.   SOCIAL HISTORY:  She is married.  She has not used tobacco products over the  past 10 years.  She has not had alcohol use.   REVIEW OF SYSTEMS:  She has had no frank syncope.  She has continued to have  chronic shortness of breath and does have O2 dependency.  She has had no  real complaints of edema.  No  recent fever, flu, and currently has a non-  productive cough.   ALLERGIES:  1.  CODEINE.  2.  SULFA.  3.  MYCIN'S.  4.  ESTRACE.  5.  DOXYCYCLINE.  6.  PROMETHAZINE.  7.  KEFLEX.   PHYSICAL EXAMINATION:  GENERAL:  She is a pleasant, elderly, white female in  no acute distress.  VITAL SIGNS:  Blood pressure is 130/70 sitting, 130/80 standing.  Weight is  146 pounds.  Heart rate is 92 and regular.  Respirations are 18.  She is  afebrile.  SKIN:  Warm and dry, color is unremarkable.  LUNGS:  Prolonged expiratory phase with basically clear breath sounds.  CARDIAC:  Regular rhythm.  There is a harsh grade 3/6 out-flow murmur.  ABDOMEN:  Soft, positive bowel sounds, nontender.  EXTREMITIES:  Without edema.  NEUROLOGIC:  Intact.  There are no gross focal deficits.  She is noted to  have O2 per nasal cannula in place.   LABORATORY DATA:  INR of 0.99, PTT of 31.  CBC is normal.  Chemistries show  a BUN of 16, creatinine is 0.9.   OVERALL IMPRESSION:  1.  Severe aortic stenosis.  2.  History of severe chronic obstructive pulmonary disease requiring      continuous oxygen.  3.   History of atrial fibrillation/flutter, maintained on Rhythmol.  4.  History of hiatal hernia/reflux.  5.  History of hypertension.  6.  History of hyperlipidemia.   PLAN:  We will proceed on with left and right heart catheterization.  The  procedure has been reviewed in full detail, including the risks, benefits,  etc., and she is willing to proceed on Tuesday, February 20, 2005.      LC/MEDQ  D:  02/19/2005  T:  02/19/2005  Job:  [id number]   cc:   Sheliah Plane, MD  [address]  [place]  [place] [address]  Email: [email or web address]   Danice Goltz, M.D. [facility]   Titus Dubin. Alwyn Ren, M.D. [facility]

## 2011-03-23 NOTE — Discharge Summary (Signed)
NAME:  [name], [name]                        ACCOUNT NO.:  [id number]   MEDICAL RECORD NO.:  [id number]                   PATIENT TYPE:  INP   LOCATION:  3731                                 FACILITY:  [facility]   PHYSICIAN:  Louis Dianne Dun                       DATE OF BIRTH:  01/22/1924   DATE OF ADMISSION:  06/16/2002  DATE OF DISCHARGE:  06/23/2002                                 DISCHARGE SUMMARY   ADMITTING DIAGNOSES:  1. Dyspnea on exertion.  2. New onset atrial fibrillation currently rate controlled.   DISCHARGE DIAGNOSES:  1. End-stage chronic obstructive pulmonary disease with asthmatic component,     improved.  2. Question of hemoptysis, resolved by June 23, 2002.   SIGNIFICANT DIAGNOSTICS AND LABORATORY DURING ADMISSION:  Echocardiogram on  June 18, 2002, results:   1. This study was inadequate for the evaluation of left ventricular regional     wall motion.  2. There was mild mitral annular calcification.  3. Left atrial size was at the upper limits of normal.  4. The images are technically difficult and however the LV is definitely     seen well enough to say the LV function looks good.  RV is poorly seen     but I suspect that it is okay.  5. The images are technically difficult and limited, Dr. Luis Abed,     M.D. [facility].   CBC on June 21, 2002:  WBC was 12.8, hemoglobin 12.7, and hematocrit 37.9.  She was occult blood negative.  BMET on June 20, 2002, normal except for a  glucose of 138.  Blood calcium was 9.1.  TSH was 2.385.  T4 was 0.93,  magnesium 2.2, troponin less than 0.01, and CK-MB on June 17, 2002 was CK  total was 111, CK, MB was 3.3, a relative index 3.0.   Chest x-ray on June 22, 2002, impression:   1. No change in the apparent scarring in the right mid lung as well as the     lung bases.  2. No active findings are present.   Chest x-ray on June 16, 2002:  Bibasilar atelectasis without evidence of  acute cardiopulmonary  process.   MEDICATIONS ON DISCHARGE:  The patient will be sent home with nebulizer  treatments of Albuterol 2.5 mg in a 3 ml solution q. 4 h. while awake,  Atrovent 0.5 mg in a 2.5 ml neb solution q. 4 h. while awake, Protonix 40 mg  P.O. q.d., Diltiazem 180 mg P.O. q.d., Zoloft 50 mg P.O. q.d., prednisone  taper as follows, 20 mg 1 P.O. b.i.d. times three days, 50 mg 1 P.O. bid  times three days, 10 mg 1 P.O. b.i.d. times three days, 10 mg a day times  three days, 10 mg every other day for three doses and then stop.  Regimen to  be managed by a  community M.D.  Tequin 400 mg 1 P.O. q.d. for six more  doses, Guaifenesin, Humibid-LA 600 mg 1 P.O. b.i.d., Advair one puff of the  100/50 diskus INH b.i.d.  Primary M.D., Dr. Alwyn Ren.   COURSE OF HOSPITALIZATION:  The patient was admitted from Dr. Frederik Pear  office due to dyspnea.  X-rays as above on June 16, 2002.  She had been on  Tequin as an outpatient.  It was determined that it would be best if her  antibiotic therapy were continued.  Her initial laboratories were coming  back negative for an MI.  However, she was noted to be in A. Fib./junctional  rate control.  She was therefore placed on a Heparin drip and Cardizem drip  and changed to P.O. Cardizem subsequently.  Nebulizer treatments were  initiated because it was also felt that she had a COPD exacerbation with an  asthma component.  The patient continued to improve, however, on June 21, 2002 she questioned whether her blood stained sputum was an issue.  We  attempted to get sputum for culture on June 22, 2002, however, the patient  had already started to decrease the amount of frank sputum and no hemoptysis  was noted on June 23, 2002.  Overall, the patient demonstrated  improvement.  She remained in sinus rhythm with P.O. Cardizem and was felt  to be stable for discharge with home health services as noted above.   EXAM ON DISCHARGE:  The patient states that she is tired but  ready to go  home.  Her vital signs are 96.9, 130/80, 78, 20, and 96 percent on two  liters.  Her CBG on June 22, 2002 is 133.  There are no other new labs to  report.  She is awake, alert, and oriented.  She is up and out of bed.  Her  heart has a regular rate and rhythm.  There is no wheeze.  There are  decreased breath sounds bilaterally.  There is no edema in her extremities.  Her abdomen is slightly obese, soft, good bowel sounds.  Her chest x-ray  shows no change, no active findings, scarring noted in the bases.   PROBLEM:  1. Chronic obstructive pulmonary disease with asthmatic component, end     stage.  She will have home nebulizers and home O2.  2. New onset atrial fibrillation.  She is in normal sinus rhythm on     Cardizem.  3. Hemoptysis.  She has less productive cough today and no bloody stained     sputum has been noted, ID she is continue her course of antibiotics for a     completion of 14 days.  Her fatigue is likely due to deconditioning and     she is ambulatory in her room and is encouraged to progress at home.   DISPOSITION:  She is home today with home health care services, O2, and  nebulizers.     Governor Specking, NP [facility]               [name]    TJ/MEDQ  D:  06/23/2002  T:  06/25/2002  Job:  [id number]

## 2011-03-23 NOTE — Assessment & Plan Note (Signed)
[facility]                             PULMONARY OFFICE NOTE   NAME:WHITCOMBDallana, Mavity                     MRN:          [id number]  DATE:01/31/2007                            DOB:          [birth year]    HISTORY OF PRESENT ILLNESS:  This is an 77 year old white female patient  of Dr. Jayme Cloud who has a known history of chronic rhinitis and COPD  with an asthmatic bronchitic component.  The patient presents today  complaining of increased cough over the last 2 weeks with yellow, thick  mucus, nasal congestion, and intermittent wheezing.  The patient denies  any hemoptysis, orthopnea, PND, or leg swelling.   PAST MEDICAL HISTORY:  Reviewed.   CURRENT MEDICATIONS:  Reviewed.   PHYSICAL EXAM:  The patient is a pleasant female in no acute distress.  She is afebrile with stable vital signs.  O2 saturation is 97% on 2-and-  a-half L.  HEENT:  Nasal mucosa is slightly pale.  Nontender sinuses.  Posterior  oropharynx is clear.  NECK:  Supple without cervical adenopathy.  No JVD.  LUNGS:  Reveal coarse rhonchi bilaterally.  CARDIAC:  Regular rate.  Grade 2/6 systolic murmur.  ABDOMEN:  Soft and nontender.  EXTREMITIES:  Warm without any edema.   IMPRESSION AND PLAN:  Acute tracheobronchitis.  The patient is to begin  Augmentin x7 days.  Mucinex DM twice daily.  Tussionex #4 ounces, 1/2 to  1 teaspoon every 12 hours as needed for cough.  The patient is to return  back with Dr. Jayme Cloud as scheduled or sooner if needed.      Rubye Oaks, NP  Electronically Signed      [name], MD  Electronically Signed   TP/MedQ  DD: 01/31/2007  DT: 01/31/2007  Job #: [id number]

## 2011-03-23 NOTE — Consult Note (Signed)
[name], [name]               ACCOUNT NO.:  [id number]   MEDICAL RECORD NO.:  [id number]          PATIENT TYPE:  INP   LOCATION:  2027                         FACILITY:  [facility]   PHYSICIAN:  Clinton D. Maple Hudson, M.D. DATE OF BIRTH:  [birth year]   DATE OF CONSULTATION:  03/04/2005  DATE OF DISCHARGE:                                   CONSULTATION   PROBLEM:  Pulmonary consultation requested today by Dr. Viann Fish  because of persistent exertional dyspnea nine days after aortic valve  replacement.   HISTORY:  This 77 year old is followed by Dr. Danice Goltz for oxygen and  home nebulizer dependent COPD.  I do not have pulmonary function tests  scores.  Exercise tolerance has been limited.  She presented with new atrial  fibrillation in February.  She has been trying to ambulate in her room and  on a limited basis, in the hallway, on two liters of oxygen, but is  frustrated that she sees little improvement in exertional tolerance over the  last several days.  She does indicate that after Lasix was given today with  what she describes as a two liter diuresis this afternoon, she feels that  she is breathing better.  She is also attempting to use her incentive  spirometry, flutter, and splinting pillow, but she feels cough to clear  chest secretions has been quite painful.  Current analgesic control seems  better.  She recognizes yellow-brown sputum which comes up only with  difficulty.  She believes she has needed to take full dose Mucinex and she  now expresses confidence that the Augmentin started March 02, 2005 will  help.  She feels the nebulizer treatments being given in the hospital are  comparably effective to the albuterol and ipratropium nebs that she uses at  home.   REVIEW OF SYSTEMS:  Primarily significant for incisional sternal pain with  cough, shallow respiratory effort, scant secretion, and weakness.  She feels  heartburn control is much better with Nexium than it  was with Protonix, but  she is anorectic without nausea.   PAST HISTORY:  1.  Oxygen and home nebulizer dependent COPD.  2.  Atrial fibrillation with somewhat rapid ventricular response, nine days      status post aortic valve replacement.  3.  Hiatal hernia.  4.  Gastroesophageal reflux disease.  5.  Hypertension.  6.  Hyperlipidemia.  7.  Previous histories of irritable bowel syndrome and of hemorrhoidal      bleeding.   FAMILY HISTORY:  Noncontributory.   SOCIAL HISTORY:  Quit smoking 10 years ago, married.   EXAM:  BP 98/57, heart rhythm is atrial fibrillation by monitor at 117 per  minute, respirations 20 and shallow, can speak in full sentences on oxygen  at 2 liters.  Is now set on 2.5 liter prongs recording saturations 97%.  Temperature 98.4.  Pleasant, alert and oriented woman who is conversational.  SKIN:  Sallow.  ADENOPATHY:  None found.  HEENT:  Oral mucosa clear.  No neck vein distention, no stridor.  CHEST:  Sternal incision, shallow quiet breath sounds with  only trace  crackles heard in the lower lung zones posteriorly and a tiny amount of end-  expiratory wheeze particularly in the right mid lung zone.  No rales, rub or  rhonchi.  ABDOMEN:  Soft.  EXTREMITIES:  No cyanosis or edema.   LABORATORY:  White blood count has risen from 9700 to 11,400 in the last two  days, hemoglobin 9.5/hematocrit 28, B Natriuretic Peptide was 228 before  diuresis today, chest x-ray shows postoperative changes and has been stable  over the last several days with small bilateral effusions, some patchy  atelectasis or infiltrate in the lower lung zones, but no frank heart  failure.   IMPRESSION:  1.  Multifactorial postoperative dyspnea.  Key at this point is that I do      not have baseline pulmonary function tests to understand what her true      pulmonary function was, separate from the dyspnea component attributable      to her heart valve.  2.  Bronchitis or bronchopneumonia  with retained secretions and impaired      cough.  3.  Mild anemia.  4.  Atrial fibrillation.  5.  Question of subtle congestive heart failure now addressed with diuresis      by Lasix.   RECOMMENDATIONS:  I have no medication changes to suggest at this time.  Would continue her nebulized bronchodilators, Augmentin, and Mucinex.  I  will discuss her with Dr. Jayme Cloud who returns tomorrow and knows her  pulmonary status.  Would not add steroids at this point.  I am not convinced  there is significant bronchospasm that would benefit.  It may become helpful  to address her mild anemia with an erythropoiesis stimulating medication.  We will work with you in an effort to speed up her recovery as remediable  factors are identified.      [name]/MEDQ  D:  03/04/2005  T:  03/04/2005  Job:  [id number]   cc:   Colleen Can. Deborah Chalk, M.D.  Fax: [phone]   Danice Goltz, M.D. [facility]   Sheliah Plane, MD  [address]  [place]  [place] [address]  Email: [email or web address]

## 2011-03-23 NOTE — H&P (Signed)
NAMEMARSHELL, RIEGER              ACCOUNT NO.:  [id number]   MEDICAL RECORD NO.:  [id number]          PATIENT TYPE:  INP   LOCATION:  2871                         FACILITY:  [facility]   PHYSICIAN:  Colleen Can. Deborah Chalk, M.D.DATE OF BIRTH:  [birth year]   DATE OF ADMISSION:  05/29/2005  DATE OF DISCHARGE:                                HISTORY & PHYSICAL   CHIEF COMPLAINT:  Fatigue.   HISTORY OF PRESENT ILLNESS:  Ms. Cygan is a pleasant 77 year old female.  She has multiple medical problems.  She has had aortic valve replacement due  to aortic stenosis that was performed on February 23, 2005.  Her postoperative  course was complicated by shortness of breath and recurrent atrial  fibrillation.  She subsequently underwent cardioversion prior to her  discharge on Mar 11, 2005.  Initially she held in normal sinus rhythm, but  has had recurrent atrial fibrillation.  She has been placed back on Coumadin  anticoagulation and now presents for attempts at repeat cardioversion to  restore normal sinus rhythm.  Clinically, she has remained fatigued with  more shortness of breath.   PAST MEDICAL HISTORY:  1.  Known aortic stenosis, previous aortic valve replacement with a      Medtronic Mosaic porcine aortic valve on February 23, 2005.  Model #305,      serial number [id number].  2.  Severe chronic obstructive pulmonary disease requiring home O2 therapy.  3.  Past history of atrial fibrillation.  She has had previous cardioversion      in May of 2006.  4.  Antiarrhythmic therapy with Rythmol.  5.  Gastroesophageal reflux disease.  6.  Anemia.  7.  Hypertension.  8.  Hyperlipidemia.  9.  Hiatal hernia.  10. Irritable bowel syndrome.  11. History of congestive heart failure.  12. Chronic Coumadin therapy.  13. Past cardiac catheterization February 20, 2005, showing severe aortic      stenosis, diffuse insignificant coronary irregularities with      nonobstructive disease and normal LV function.   ALLERGIES:  No known drug allergies.   CURRENT MEDICATIONS:  1.  Cardizem LA 240 mg a day.  2.  Lanoxin 0.125 daily.  3.  Lasix 40 mg a day.  4.  Multivitamin with iron daily.  5.  Potassium 20 mEq a day.  6.  Stool softener daily.  7.  Coumadin 2 mg x7 days a week.  8.  Zoloft 50 mg a day.  9.  Singulair 10 mg a day.  10. Nexium 40 mg b.i.d.  11. Mucinex 40 mg t.i.d.  12. Rythmol 325 mg b.i.d.  13. Nebulizers twice a day.  14. Advair twice a day.  15. Spiriva daily.   FAMILY HISTORY:  Unchanged.   SOCIAL HISTORY:  Unchanged.   REVIEW OF SYSTEMS:  As noted above.  She has had no complaints of chest  pain.  She has had lower extremity edema off and on since her surgery.  She  has been somewhat slow to progress, but she has had no frank syncope.  She  is not really lightheaded or dizzy.  She  does just feel basically weak and  washed out.  Her shortness of breath seems to be at baseline.  She has had  previous bouts of bronchitis requiring antibiotic therapy.  She has had no  chest pain.   PHYSICAL EXAMINATION:  GENERAL:  She is a pleasant, elderly female,  currently in no acute distress.  VITAL SIGNS:  Weight is 143 pounds, blood pressure 122/68 sitting, 112/70  standing, heart rate 76 and irregular.  LUNGS:  Show prolonged expiratory phase with diminished breath sounds.  HEART:  Irregular irregular rhythm.  There is a soft outflow murmur.  ABDOMEN:  Soft.  Sternal wound is well-healed.  EXTREMITIES:  Trace amount of edema bilaterally.  NEUROLOGY:  Intact.  There are no gross focal deficits.   LABORATORY DATA:  Pending.   IMPRESSION:  1.  Recurrent atrial fibrillation.  2.  Antiarrhythmic therapy with Rythmol.  3.  Coumadin therapy.  4.  Previous aortic valve replacement in April of 2006.  5.  Severe chronic obstructive pulmonary disease requiring home O2 therapy.  6.  History of anemia.  7.  Hypertension, currently controlled.   PLAN:  Will proceed on with attempts  at repeat cardioversion.  The procedure  has been reviewed in full detail and she is willing to proceed on Tuesday,  July 25, case #[id number].      LC/MEDQ  D:  05/25/2005  T:  05/25/2005  Job:  [id number]

## 2011-03-23 NOTE — H&P (Signed)
NAME:  [name], [name]                        ACCOUNT NO.:  [id number]   MEDICAL RECORD NO.:  [id number]                   PATIENT TYPE:  INP   LOCATION:  4737                                 FACILITY:  [facility]   PHYSICIAN:  Titus Dubin. Alwyn Ren, M.D. [facility]         DATE OF BIRTH:  04/02/1924   DATE OF ADMISSION:  10/08/2003  DATE OF DISCHARGE:                                HISTORY & PHYSICAL   HISTORY OF PRESENT ILLNESS:  [name] is a 77 year old white female  with advanced obstructive lung disease who has had a flare of her COPD.  She  was seen on three occasions during the month of November 2004 - on November  10, November 17, and November 22 - for similar symptoms with suggestion of  possible right lower lobe pneumonitis on chest x-ray.  She has been  chronically indolently ill since early October, in fact.  On August 11, 2003, she was treated with amoxicillin.  She was reevaluated on August 14, 2003 with a suboptimal response to the antibiotic.  On August 19, 2003, she  was placed on Medrol, and the medication was changed to Augmentin 875 mg  q.12h.  When seen on September 15, 2003, she was changed to Avelox, which she  took for a total of 12 days.  When she returned on September 27, 2003, she  stated that she was only some better despite the 12 days of Avelox.  She  continued to have head congestion; the sputum was slight yellow.  At that  time, she was afebrile, and O2 saturation was 93% on two liters.  Adjustments were made in her maintenance medications, and she was placed on  lorazepam, as it was felt clinically there was significant anxiety  component, as her oxygen saturation and clinical state seemed stable.  She  returned October 08, 2003, stating she had had increased secretions over  several days with a cough productive of purulent secretions with streaky  hemoptysis as of October 06, 2003.  Also, she experienced left pleuritic  chest pain.   She had seen her  allergist, Stevphen Rochester, M.D., on October 07, 2003, and  had testing repeated, which was negative.   In the office, O2 saturations were 89% on two liters, and resting pulse of  110.  Chest x-ray suggested increased markings at the bases with possible  pneumonitis versus bronchiectasis.  Because of her COPD exacerbation at this  time, despite having prolonged course of antibiotics, and pleuritic pain and  streaky hemoptysis, she was admitted.   PAST MEDICAL HISTORY:  Includes hospitalizations previously for bronchitis.  Her most recent hospitalization was June 16, 2002 to June 23, 2002 for  exertional dyspnea in the setting of new onset atrial fibrillation.  She was  hospitalized for pneumonia in 1980, and for abdominal pain in 1997.   PAST SURGICAL HISTORY:  1. Back surgery.  2. Tonsillectomy.  3. Appendectomy.  4. Ganglionectomy.  5. Vocal cord polyp removal.   MEDICAL PROBLEMS:  1. Diverticulosis.  2. Esophageal reflux.  3. She has been documented to have mild sleep apnea.   FAMILY HISTORY:  Renal failure in her father.  Colon cancer in a maternal  aunt.  Mother had valvular heart disease and coronary artery bypass  grafting.  Maternal grandmother had a stroke.  Maternal grandfather had  heart disease.  Paternal grandfather had a myocardial infarction.  Her  daughter has breast cancer.   SOCIAL HISTORY:  She quit smoking in 1995.  She does not drink.  She does  provide care for her husband, who has multiple systems disease, and is  essentially disabled.   MEDICATIONS:  Her medications are extensive, and include -  1. Metamucil.  2. Fosamax.  3. Multivitamin.  4. Guaifenesin.  5. Cartia XT 180 mg daily.  6. Advair 100/50 one inhalation every 12 hours.  7. Nebulized albuterol and Atrovent q.4h. as needed.  8. Zoloft 50 mg daily.  9. Lescol 40 mg daily.  10.      Nexium 40 mg daily.  11.      Spiriva one inhalation daily.  12.      Furosemide 40 mg one-half  daily.  13.      Nasonex as needed.  14.      Ultram as needed.  15.      Lorazepam as needed.   ALLERGIES:  She lists as allergies or intolerances as -  1. CODEINE.  2. SULFA.  3. ESTRACE.  4. ANALPRAM.  5. ALL MYCINS.  6. CEPHALEXIN.  7. PROMETHAZINE WITH CODEINE.  8. DOXYCYCLINE.  9. HYDROCORTISONE.   REVIEW OF SYSTEMS:  Has focused on her pulmonary issues.  She also has  fatigue.  She has had no anginal component to her pain.   PHYSICAL EXAMINATION:  GENERAL:  She appears chronically ill.  VITAL SIGNS:  Weight was 150.  Temperature 98.8, pulse 110 at rest,  respiratory rate 22-25, blood pressure 120/70.  HEENT:  She has arteriolar narrowing.  Dental hygiene is fair.  She is  slightly hoarse, and there is mild erythema of the oropharynx.  NECK:  Thyroid is normal to palpation.  LUNGS:  Breath sounds are decreased.  No rub was noted, and there is no  increased work of breathing.  HEART:  She has a grade 1.5 systolic murmur loudest at the right base,  compatible with her known aortic stenosis.  A left carotid bruit was  present.  EXTREMITIES:  Pedal pulses were decreased.  She had no edema.  Homan's sign  is negative.  ABDOMEN:  No organomegaly or masses.  She has no lymphadenopathy about the  head, neck, or axilla.  NEUROLOGIC:  She is generally weak, but there are no localizing  neuropsychiatric deficits, although she does appear anxious clinically.   IMPRESSION:  She is now admitted with a chronic obstructive pulmonary  disease exacerbation with hypoxemia and tachycardia in the setting of  bronchitis and possible pneumonitis versus bronchiectasis with pleuritic  pain.   PLAN:  She will be treated with IV antibiotics, and a CT scan will be  performed to define the changes on the plain film.  It will be recommended  that she be seen in consultation by pulmonary critical care while in the hospital, as there has been no dramatic improvement or change from August 12, 2003 until present.  Titus Dubin. Alwyn Ren, M.D. [facility]    WFH/MEDQ  D:  10/09/2003  T:  10/10/2003  Job:  [id number]   cc:   Colleen Can. Deborah Chalk, M.D.  Fax: [phone]

## 2011-03-23 NOTE — Op Note (Signed)
NAMEJALON, [name]               ACCOUNT NO.:  [id number]   MEDICAL RECORD NO.:  [id number]          PATIENT TYPE:  INP   LOCATION:  2302                         FACILITY:  [facility]   PHYSICIAN:  Kaylyn Layer. Michelle Piper, M.D.   DATE OF BIRTH:  09/28/1924   DATE OF PROCEDURE:  02/23/2005  DATE OF DISCHARGE:                                 OPERATIVE REPORT   PROCEDURE:  Transesophageal echocardiography probe insertion and monitoring  for aortic valve replacement.   REASON FOR PROCEDURE:  I was consulted by Dr. Ofilia Neas to place a  transesophageal echo probe into [name] during aortic valve procedure.   [name] is a 77 year old lady with aortic stenosis and nonsignificant  coronary artery disease who presents today for aortic valve replacement.  After the uneventful induction of anesthesia and endotracheal intubation,  transesophageal echo probe was easily placed into the patient's stomach.  Initial short axis view of the left ventricle showed left ventricular  hypertrophy with no wall motion abnormalities.   Mitral valve:  Structurally, the valve looked normal.  The valve coapted  normally and there was no calcification of the valve.  On placing color flow  Doppler across the valve, there was trace mitral regurgitation which was  seen best in the 160 degree view.  There was no fluttering of the anterior  leaflet of the mitral valve.  The left ventricular  outflow tract was normal  without any sign of aortic insufficiency.   Aortic valve was tricuspid, heavily calcified, and on measuring the valve  opening in systole, there was a valve area of 0.7.  In the longitudinal  view, there was a trace of aortic insufficiency and severe aortic stenosis.  The valve annulus measured 1.77 cm.  The supravalvular dilatation at its  greatest length was 2.95 cm.  Shortly after that, the aorta returned to 2.35  cm diameter.   The patient was placed on cardiopulmonary bypass by Dr. Tyrone Sage and  underwent aortic valve replacement.  Immediately prior to coming off  cardiopulmonary bypass, an inspection of the heart was done for intracardiac  air, there was minimal amount.  The left ventricular vent was removed and  using minimal inotropic support, the patient had cardiopulmonary bypass  discontinued.  At this time, the left ventricle was seen to be unchanged  from preoperatively with left ventricular hypertrophy without any wall  motion abnormalities.  The mitral valve was unchanged with trace to 1+  mitral regurgitation and the aortic prosthetic valve could be seen seated in  the aortic annulus.  There was no aortic insufficiency and mild turbulent  supravalvular flow was seen.  The  rest of the exam was unchanged.   At the end of the procedure, the transesophageal echo probe was easily  removed from the patient's mouth and the patient was taken intubated to the  intensive care unit.      KDO/MEDQ  D:  02/23/2005  T:  02/23/2005  Job:  [id number]

## 2011-03-23 NOTE — Letter (Signed)
September 03, 2006     Colleen Can. Deborah Chalk, M.D.  [address], [address]  [place], [place] [address]   RE:  [name], [name]  MRN:  [id number]  /  DOB:  [birth year]   Dear Weyman Croon,   It was a pleasure to see Mrs. Heuer today with her husband regarding her  atrial arrhythmias.   She has had atrial arrhythmias dating back to the time of her aortic valve  replacement undertaken for a severe aortic stenosis.  Notably, she had no  coronary disease at that time.  These have been described as atrial flutter.  She has undergone cardioversions x2.   She was treated with Rythmol, which was stopped, probably because of lack of  efficacy.  She had been treated since then with Cardizem and Digitek.   It is her impression that she is significantly weaker with more dyspnea and  more fatigue in atrial flutter than in sinus rhythm.   She also has oxygen dependent COPD as secondary to asthma and emphysema.  This has an impact on her exercise tolerance but this seems to be fairly  stable as the rhythm changes.   She has some peripheral edema.  Does not have nocturnal dyspnea  interestingly.  She has no orthopnea.   Interestingly, when she was at cardiac rehab and was exercising, her heart  rate was noted to be at 144.  So I suspect she went from 4:1 to 2:1 block.   Her past medical history is notable for:  1. GE reflux disease.  2. Arthritis.  3. Constipation and fatigue.  4. Pulmonary disease.  5. Anxiety/depression.   Her current medications include:  1. Symbicort 160/4.5.  2. Spiriva 18 mcg.  3. Cardizem 300.  4. Digitek 0.125.  5. Furosemide 40.  6. Singulair 10.  7. Potassium 20.  8. Zetia 10.  9. Warfarin 1.  10.Zegerid 40.  11.Zoloft 50.  12.She also takes Xopenex inhalers and Veramyst.   She has no known drug allergies.   SOCIAL HISTORY:  She is married.  She has 3 children.  She does not use  cigarettes, alcohol, or recreational drugs.   EXAMINATION:  She is an  elderly, Caucasian female with oxygen in her nose.  Her blood pressure is 130/76.  Her pulse is 72.  She was in no acute  distress.  HEENT:  Demonstrated no icterus or xanthomata.  Neck veins were 8 to 9 cm.  Carotids were brisk.  The back was without kyphosis, scoliosis, with diminished breath sounds.  Heart sounds were regular without murmurs or gallops.  ABDOMEN:  Soft and active bowel sounds without midline pulsation or  hepatomegaly.  Femoral pulses were 2+.  Distal pulses were intact.  There  was 1-2+ peripheral edema, greater on the right.   Electrocardiogram dated today demonstrated atrial flutter that was atypical  in that she had upright flutter waves in V1 and positive flutter waves in  the inferior leads.   Other laboratories drawn from your office demonstrated a sugar that was  mildly elevated, a creatinine that was normal, digoxin level that was  normal.   IMPRESSION:  1. Atrial flutter - atypical.  2. Aortic stenosis, status post aortic valve replacement.  3. Normal ventricular function.  4. Thromboembolic risk factors notable for age and hypertension.  5. Oxygen-dependent chronic obstructive pulmonary disease.   Weyman Croon, Mrs. Brinley has atypical atrial flutter.  We would hope that this is  really a reverse atypical atrial flutter and  caval tricuspid isthmus.  We  have discussed potential treatment options including RF catheter ablation.  She would like to proceed with that in the hopes of controlling her symptoms  which have been refractory to cardioversion.  In the event that it turns out  to be non isthmus dependent and not mappable, alternatives could include  intermittent drug therapy.   We will plan to undertake this on November 14 with the support of ESI, which  is an electrical anatomical mapping system to facilitate the assessment of  her atypical flutter.   Weyman Croon, thanks very much for asking [name] to see her.    Sincerely,      ______________________________  Duke Salvia, MD, [facility]    SCK/MedQ  DD: 09/03/2006  DT: 09/04/2006  Job #: [id number]   CC:    Titus Dubin. Alwyn Ren, MD,FACP,FCCP

## 2011-03-28 ENCOUNTER — Ambulatory Visit (INDEPENDENT_AMBULATORY_CARE_PROVIDER_SITE_OTHER): Payer: Medicare Other

## 2011-03-28 ENCOUNTER — Encounter: Payer: Medicare Other | Admitting: *Deleted

## 2011-03-28 DIAGNOSIS — J309 Allergic rhinitis, unspecified: Secondary | ICD-10-CM

## 2011-03-29 ENCOUNTER — Ambulatory Visit (INDEPENDENT_AMBULATORY_CARE_PROVIDER_SITE_OTHER): Payer: Medicare Other | Admitting: Internal Medicine

## 2011-03-29 ENCOUNTER — Encounter: Payer: Self-pay | Admitting: Internal Medicine

## 2011-03-29 VITALS — BP 124/68 | HR 60 | Ht 62.5 in | Wt 129.6 lb

## 2011-03-29 DIAGNOSIS — J449 Chronic obstructive pulmonary disease, unspecified: Secondary | ICD-10-CM

## 2011-03-29 MED ORDER — DOXYCYCLINE HYCLATE 100 MG PO TABS: 100.0000 mg | ORAL_TABLET | Freq: Two times a day (BID) | ORAL | Status: AC

## 2011-03-29 NOTE — Assessment & Plan Note (Addendum)
We have limited options. This is an exacerbation of her chronic bronchitis. We will try another prednisone burst and a longer course of doxyxycline since she feels last treatment may not last long enough.

## 2011-03-29 NOTE — Patient Instructions (Signed)
prednisone 10 mg-     You can take from 0 to 4 tabs daily as you feel you need. Try to be sparing.  Script for doxycycline sent to drug store.

## 2011-03-29 NOTE — Progress Notes (Signed)
Subjective:    Patient ID: [name], female    DOB: [birth year], 77 y.o.   MRN: [id number]  HPI    Review of Systems     Objective:   Physical Exam        Assessment & Plan:   Subjective:    Patient ID: [name], female    DOB: [birth year], 77 y.o.   MRN: [id number]  HPI 01/19/11- 73 yoF former smoker followed for COPD, chronic hypoxic respiratory failure, complicated by atrial fibrillation, valvular heart disease and intermittent hemoptysis. Daughter here. Last here January 19, 2011, feeling dizzy and brown sputum. She did get better. Then at end of March she took some left over prednsione for dyspnea with throat irritation. Now, 1 week ago she noted increased nasal discharge- mucus and blood. 3 days ago she began coughing more, with increasing nasal congestion. By yesterday she mostly stayed in bed because of weakness. She thinks she had fever yesterday. Sputum now green.   03/29/11- COPD, chronic hypoxic resp failure, hx AFib, valvular heart disease, intermittent hemoptysis Says since last here her breathing has been "up and down", not helped with her efforts to self medicate with prednisone- 1 or 2 daily. ., Mainly short of breath/ fatigue with any walking or activity. Ok at rest. . Gets stuffy, wheezy, weak. Coughed some brown once. Spent weekend at beach and coughed herself clear while there. Few spots of blood. Dr Deborah Chalk will be replaced by Dr Elease Hashimoto for cardiology. Pacemaker checked ok recently.   Review of Systems See HPI Constitutional:   No weight loss, night sweats,HEENT:   No headaches,  Difficulty swallowing,  Tooth/dental problems,                No sneezing, itching, ear ache,, post nasal drip,   CV:  No chest pain,  Orthopnea, PND, swelling in lower extremities, anasarca, dizziness, palpitations  GI  No heartburn, indigestion, abdominal pain, nausea, vomiting, diarrhea, change in bowel habits, loss of appetite  Resp:   No excess mucus,No coughing up of  blood.  Skin: no rash or lesions.  GU: no dysuria, change in color of urine, no urgency or frequency.  No flank pain.  MS:  No joint pain or swelling.  No decreased range of motion.  No back pain.  Psych:  No change in mood or affect. No depression or anxiety.  No memory loss.      Objective:   Physical Exam General- Alert, Oriented, Affect-appropriate, Distress- none acute   Oxygen 2-3 L/M- now 2  Skin- rash-none, lesions- none, excoriation- none  Lymphadenopathy- none  Head- atraumatic  Eyes- Gross vision intact, PERRLA, conjunctivae clear secretions  Ears- Hearing normal  canals, Tm- normal  Nose- Clear, No- Septal dev, mucus, polyps, erosion, perforation   Throat- Mallampati II , mucosa clear , drainage- none, tonsils- atrophic  Neck- flexible , trachea midline, no stridor , thyroid nl, carotid no bruit  Chest - symmetrical excursion , unlabored     Heart/CV- RRR , 2/ 6 systolic left sternal border murmur , no gallop  , no rub, nl s1 s2                     - JVD- none , edema- none, stasis changes- none, varices- none     Lung-distant, wheeze- none, cough- loose  , dullness-none, rub- none     Chest wall-   Abd- tender-no, distended-no, bowel sounds-present, HSM- no  Br/ Gen/ Rectal- Not  done, not indicated  Extrem- cyanosis- none, clubbing, none, atrophy- none, strength- nl  Neuro- grossly intact to observation        Assessment & Plan:

## 2011-04-03 ENCOUNTER — Telehealth: Payer: Self-pay | Admitting: Internal Medicine

## 2011-04-03 NOTE — Telephone Encounter (Signed)
Per CY-continue to use Prednisone as prescribed and no abx needed.    ATC Line busy-pt stated formerly it was okay to call in the morning if no answer.

## 2011-04-03 NOTE — Telephone Encounter (Signed)
Spoke with patient-states that she is having SOB still-pt aware to increase her Prednisone Rx up to 40mg  daily as needed per last OV notes-pt would like to know if CY would like to change her antibiotic from Doxycycline as she has itching happen while taking med-she took 3 days and stopped-since she stopped Rx she has not anymore itching episodes. Please advise.    [facility] [address]

## 2011-04-04 NOTE — Telephone Encounter (Signed)
Pt aware to take prednisone as directed. Carron Curie, CMA

## 2011-04-09 ENCOUNTER — Ambulatory Visit (INDEPENDENT_AMBULATORY_CARE_PROVIDER_SITE_OTHER): Payer: Medicare Other | Admitting: *Deleted

## 2011-04-09 ENCOUNTER — Ambulatory Visit (INDEPENDENT_AMBULATORY_CARE_PROVIDER_SITE_OTHER): Payer: Medicare Other

## 2011-04-09 ENCOUNTER — Encounter: Payer: Medicare Other | Admitting: *Deleted

## 2011-04-09 DIAGNOSIS — J309 Allergic rhinitis, unspecified: Secondary | ICD-10-CM

## 2011-04-09 DIAGNOSIS — Z7901 Long term (current) use of anticoagulants: Secondary | ICD-10-CM

## 2011-04-09 DIAGNOSIS — I4891 Unspecified atrial fibrillation: Secondary | ICD-10-CM

## 2011-04-09 LAB — POCT INR: INR: 2.4

## 2011-04-13 ENCOUNTER — Telehealth: Payer: Self-pay | Admitting: Internal Medicine

## 2011-04-13 ENCOUNTER — Other Ambulatory Visit: Payer: Self-pay | Admitting: Internal Medicine

## 2011-04-13 MED ORDER — MOMETASONE FURO-FORMOTEROL FUM 200-5 MCG/ACT IN AERO: 2.0000 | INHALATION_SPRAY | Freq: Two times a day (BID) | RESPIRATORY_TRACT | Status: DC

## 2011-04-13 NOTE — Telephone Encounter (Signed)
Refill sent. Pt aware.[name], CMA  

## 2011-04-18 ENCOUNTER — Other Ambulatory Visit: Payer: Self-pay | Admitting: *Deleted

## 2011-04-18 NOTE — Telephone Encounter (Signed)
RN called in prescription for Klor-Con 20 meq one po bid #60 with six refills to [facility] [address]

## 2011-04-19 ENCOUNTER — Ambulatory Visit (INDEPENDENT_AMBULATORY_CARE_PROVIDER_SITE_OTHER): Payer: Medicare Other

## 2011-04-19 DIAGNOSIS — J309 Allergic rhinitis, unspecified: Secondary | ICD-10-CM

## 2011-04-25 ENCOUNTER — Ambulatory Visit (INDEPENDENT_AMBULATORY_CARE_PROVIDER_SITE_OTHER): Payer: Medicare Other

## 2011-04-25 ENCOUNTER — Ambulatory Visit (INDEPENDENT_AMBULATORY_CARE_PROVIDER_SITE_OTHER): Payer: Medicare Other | Admitting: *Deleted

## 2011-04-25 DIAGNOSIS — Z7901 Long term (current) use of anticoagulants: Secondary | ICD-10-CM

## 2011-04-25 DIAGNOSIS — I4891 Unspecified atrial fibrillation: Secondary | ICD-10-CM

## 2011-04-25 DIAGNOSIS — J309 Allergic rhinitis, unspecified: Secondary | ICD-10-CM

## 2011-04-25 LAB — POCT INR: INR: 2.1

## 2011-05-02 ENCOUNTER — Ambulatory Visit (INDEPENDENT_AMBULATORY_CARE_PROVIDER_SITE_OTHER): Payer: Medicare Other

## 2011-05-02 DIAGNOSIS — J309 Allergic rhinitis, unspecified: Secondary | ICD-10-CM

## 2011-05-07 ENCOUNTER — Encounter: Payer: Self-pay | Admitting: Internal Medicine

## 2011-05-11 ENCOUNTER — Ambulatory Visit (INDEPENDENT_AMBULATORY_CARE_PROVIDER_SITE_OTHER): Payer: Medicare Other

## 2011-05-11 DIAGNOSIS — J309 Allergic rhinitis, unspecified: Secondary | ICD-10-CM

## 2011-05-15 ENCOUNTER — Ambulatory Visit (INDEPENDENT_AMBULATORY_CARE_PROVIDER_SITE_OTHER): Payer: Medicare Other

## 2011-05-15 ENCOUNTER — Ambulatory Visit (INDEPENDENT_AMBULATORY_CARE_PROVIDER_SITE_OTHER): Payer: Medicare Other | Admitting: Internal Medicine

## 2011-05-15 ENCOUNTER — Encounter: Payer: Self-pay | Admitting: Internal Medicine

## 2011-05-15 VITALS — BP 118/72 | HR 85 | Ht 62.5 in | Wt 133.6 lb

## 2011-05-15 DIAGNOSIS — J309 Allergic rhinitis, unspecified: Secondary | ICD-10-CM

## 2011-05-15 DIAGNOSIS — J449 Chronic obstructive pulmonary disease, unspecified: Secondary | ICD-10-CM

## 2011-05-15 MED ORDER — METHYLPREDNISOLONE 8 MG PO TABS: 8.0000 mg | ORAL_TABLET | Freq: Every day | ORAL | Status: AC

## 2011-05-15 MED ORDER — AMOXICILLIN-POT CLAVULANATE 875-125 MG PO TABS: 1.0000 | ORAL_TABLET | Freq: Two times a day (BID) | ORAL | Status: AC

## 2011-05-15 NOTE — Patient Instructions (Signed)
Script sent for augmentin antibiotic Script sent for medrol/ methylprednisolone 8 mg, to try one daily instead of prednisone.

## 2011-05-15 NOTE — Progress Notes (Signed)
Subjective:    Patient ID: [name], female    DOB: 12/06/1923, 77 y.o.   MRN: [id number]  HPI    Review of Systems     Objective:   Physical Exam        Assessment & Plan:   Subjective:    Patient ID: [name], female    DOB: 10-10-1924, 77 y.o.   MRN: [id number]  HPI 03/29/11- 46 yoF former smoker followed for COPD, chronic hypoxic respiratory failure, complicated by atrial fibrillation, valvular heart disease and intermittent hemoptysis. Daughter here. Last here January 19, 2011, feeling dizzy and brown sputum. She did get better. Then at end of March she took some left over prednsione for dyspnea with throat irritation. Now, 1 week ago she noted increased nasal discharge- mucus and blood. 3 days ago she began coughing more, with increasing nasal congestion. By yesterday she mostly stayed in bed because of weakness. She thinks she had fever yesterday. Sputum now green.   05/15/11- 43 yoF former smoker followed for COPD, chronic hypoxic respiratory failure, complicated by atrial fibrillation, valvular heart disease and intermittent hemoptysis. Acute visit- We had sent script for prednisone. She can't stay off prednisone, at least 10 mg daily, and complains of weight gain in abdomen. Dyspnea walking hall at home on 3.5 L/m O2 has to pace herself. Ok sitting still. Reaching and lifting with arms is especially hard. Family visits to help with chores. Some soreness under ribs if she leans over. Coughs up clear mucus- sometimes green or dark yellow. Something caused itching ? Doxycycline.  Review of Systems See HPI Constitutional:   No weight loss, night sweats,HEENT:   No headaches,  Difficulty swallowing,  Tooth/dental problems,                No sneezing, itching, ear ache,, post nasal drip,   CV:  No chest pain,  Orthopnea, PND, swelling in lower extremities, anasarca, dizziness, palpitations  GI  No heartburn, indigestion, abdominal pain, nausea, vomiting, diarrhea,  change in bowel habits, loss of appetite  Resp:   No excess mucus,   No coughing up of blood.  Skin: no rash or lesions.  GU: no dysuria, change in color of urine, no urgency or frequency.  No flank pain.  MS:  No joint pain or swelling.  No decreased range of motion.  No back pain.  Psych:  No change in mood or affect. No depression or anxiety.  No memory loss.      Objective:   Physical Exam General- Alert, Oriented, Affect-appropriate, Distress- none acute   Oxygen 2-3 L/M- now 2  Skin- rash-none, lesions- none, excoriation- none  Lymphadenopathy- none  Head- atraumatic  Eyes- Gross vision intact, PERRLA, conjunctivae clear secretions  Ears- Hearing normal  canals, Tm L , R ,  Nose- Clear,No-  Septal dev, mucus, polyps, erosion, perforation   Throat- Mallampati II , mucosa clear , drainage- none, tonsils- atrophic  Neck- flexible , trachea midline, no stridor , thyroid nl, carotid no bruit  Chest - symmetrical excursion , unlabored     Heart/CV- RRR , 2/ 6 systolic left sternal border murmur , no gallop  , no rub, nl s1 s2                     - JVD- none , edema- none, stasis changes- none, varices- none     Lung-distant, wheeze- none, cough- upper airway rattle.  , dullness-none, rub- none  Chest wall-   Abd- tender-no, distended-no, bowel sounds-present, HSM- no  Br/ Gen/ Rectal- Not done, not indicated  Extrem- cyanosis- none, clubbing, none, atrophy- none, strength- nl  Neuro- grossly intact to observation        Assessment & Plan:

## 2011-05-15 NOTE — Assessment & Plan Note (Addendum)
Severe COPD. We had an end of life preliminary discussion .She is inclined to continue at home but I would support consideration of assisted living.  We will give augmentin and try using medrol instead of prednisone

## 2011-05-16 ENCOUNTER — Ambulatory Visit: Payer: Medicare Other | Admitting: Internal Medicine

## 2011-05-16 ENCOUNTER — Encounter: Payer: Medicare Other | Admitting: *Deleted

## 2011-05-17 ENCOUNTER — Telehealth: Payer: Self-pay | Admitting: Internal Medicine

## 2011-05-17 NOTE — Telephone Encounter (Signed)
LMTCB

## 2011-05-18 ENCOUNTER — Ambulatory Visit (INDEPENDENT_AMBULATORY_CARE_PROVIDER_SITE_OTHER): Payer: Medicare Other | Admitting: *Deleted

## 2011-05-18 DIAGNOSIS — Z7901 Long term (current) use of anticoagulants: Secondary | ICD-10-CM

## 2011-05-18 DIAGNOSIS — I4891 Unspecified atrial fibrillation: Secondary | ICD-10-CM

## 2011-05-18 MED ORDER — LEVALBUTEROL HCL 1.25 MG/3ML IN NEBU: 1.0000 | INHALATION_SOLUTION | Freq: Three times a day (TID) | RESPIRATORY_TRACT | Status: DC | PRN

## 2011-05-18 NOTE — Telephone Encounter (Signed)
LMTCBx2. Jennifer Castillo, CMA  

## 2011-05-18 NOTE — Telephone Encounter (Signed)
Pt returning call.[name] ° °

## 2011-05-18 NOTE — Telephone Encounter (Signed)
Called, spoke with pt.  She is aware why [facility] was only sending a 1 month supply.  I informed her they will be now sending her a 3 month supply -- shipment to go out in 8-10 days after pharmacist reviews rx.  She verbalized understanding of this and will call back if she will run out of med before shipment is received.

## 2011-05-18 NOTE — Telephone Encounter (Signed)
Pt returned call.  States [facility] is still sending a 1 month supply and she is supposed to be getting a 3 month supply bc this is cheaper.  She is requesting we call them to figure this out.  [employer], spoke with Velta Addison who states pt is receiving a 1 month supply bc the instructions state it is "as needed."  I advised pt is requesting a 90 day supply bc it is cheaper for her this way and she does not want the 1 month rx.  She states they will have to cancel the rx they have on file and I will need to give a new order so this can be fixed for a 90 day supply.  I gave verbal to Adela Lank for a 90 day supply of the xopenex neb - Adela Lank verbalized understanding of this.  Also, states a shipment was to go out to pt on 05/21/11 for a 32 day supply - this shipment has been cancelled.  Per Adela Lank, the 90 day shipment should go out in 8-10 days once the rx is reviewed by the pharmacist (but states it could go out sooner than this).  LMOMTCB to inform pt of this.

## 2011-05-21 ENCOUNTER — Encounter: Payer: Self-pay | Admitting: Internal Medicine

## 2011-05-21 ENCOUNTER — Ambulatory Visit (INDEPENDENT_AMBULATORY_CARE_PROVIDER_SITE_OTHER): Payer: Medicare Other | Admitting: Internal Medicine

## 2011-05-21 ENCOUNTER — Ambulatory Visit (INDEPENDENT_AMBULATORY_CARE_PROVIDER_SITE_OTHER): Payer: Medicare Other

## 2011-05-21 ENCOUNTER — Other Ambulatory Visit (INDEPENDENT_AMBULATORY_CARE_PROVIDER_SITE_OTHER): Payer: Medicare Other

## 2011-05-21 VITALS — BP 120/60 | HR 63 | Ht 62.5 in | Wt 133.2 lb

## 2011-05-21 DIAGNOSIS — D649 Anemia, unspecified: Secondary | ICD-10-CM

## 2011-05-21 DIAGNOSIS — J301 Allergic rhinitis due to pollen: Secondary | ICD-10-CM

## 2011-05-21 DIAGNOSIS — J449 Chronic obstructive pulmonary disease, unspecified: Secondary | ICD-10-CM

## 2011-05-21 DIAGNOSIS — J309 Allergic rhinitis, unspecified: Secondary | ICD-10-CM

## 2011-05-21 LAB — CBC WITH DIFFERENTIAL/PLATELET
Basophils Absolute: 0 10*3/uL (ref 0.0–0.1)
Eosinophils Absolute: 0 10*3/uL (ref 0.0–0.7)
Hemoglobin: 10.8 g/dL — ABNORMAL LOW (ref 12.0–15.0)
Lymphocytes Relative: 6.5 % — ABNORMAL LOW (ref 12.0–46.0)
MCHC: 32.3 g/dL (ref 30.0–36.0)
Monocytes Absolute: 0.8 10*3/uL (ref 0.1–1.0)
Neutro Abs: 11.1 10*3/uL — ABNORMAL HIGH (ref 1.4–7.7)
Neutrophils Relative %: 86.7 % — ABNORMAL HIGH (ref 43.0–77.0)
RDW: 16.9 % — ABNORMAL HIGH (ref 11.5–14.6)

## 2011-05-21 NOTE — Assessment & Plan Note (Addendum)
Allergy vaccine makes her feel more controlled. I have discussed likelyhood that much of her respiratory pattern is non allergic at this age, affected more by infection, irritant/ vasomotor, and other triggers. By coming her weekly for shots, she has more opportunity to be looked at on short notice if needed.

## 2011-05-21 NOTE — Assessment & Plan Note (Signed)
We discussed hx of anemia and its potential contribution to dyspnea. Will recheck CBC

## 2011-05-21 NOTE — Assessment & Plan Note (Addendum)
Clinically she has a persistent controlled broncitis component.  Benign hemorrhagic bronchitis occurs intermittently.

## 2011-05-21 NOTE — Progress Notes (Signed)
Subjective:    Patient ID: [name], female    DOB: Mar 13, 1924, 77 y.o.   MRN: [id number]  HPI    Review of Systems     Objective:   Physical Exam        Assessment & Plan:   Subjective:    Patient ID: [name], female    DOB: 1924-07-17, 77 y.o.   MRN: [id number]  HPI    Review of Systems     Objective:   Physical Exam        Assessment & Plan:   Subjective:    Patient ID: [name], female    DOB: 08/20/1924, 77 y.o.   MRN: [id number]  HPI 03/29/11- 77 yoF former smoker followed for COPD, chronic hypoxic respiratory failure, complicated by atrial fibrillation, valvular heart disease and intermittent hemoptysis. Daughter here. Last here January 19, 2011, feeling dizzy and brown sputum. She did get better. Then at end of March she took some left over prednsione for dyspnea with throat irritation. Now, 1 week ago she noted increased nasal discharge- mucus and blood. 3 days ago she began coughing more, with increasing nasal congestion. By yesterday she mostly stayed in bed because of weakness. She thinks she had fever yesterday. Sputum now green.   05/15/11- 77 yoF former smoker followed for COPD, chronic hypoxic respiratory failure, complicated by atrial fibrillation, valvular heart disease and intermittent hemoptysis. Acute visit- We had sent script for prednisone. She can't stay off prednisone, at least 10 mg daily, and complains of weight gain in abdomen. Dyspnea walking hall at home on 3.5 L/m O2 has to pace herself. Ok sitting still. Reaching and lifting with arms is especially hard. Family visits to help with chores. Some soreness under ribs if she leans over. Coughs up clear mucus- sometimes green or dark yellow. Something caused itching ? Doxycycline.  05/21/11-77 yoF former smoker followed for COPD, chronic hypoxic respiratory failure, complicated by atrial fibrillation/ pacemaker, valvular heart disease, anemia and intermittent hemoptysis. At  last visit we gave augmentin and medrol 8 mg daily.. After a few days she was feeling better. Over next few days she was more short of breath, and quite winded today after coming alone, carrying her O2. Sputum is ligther yellow. Finishes augmentin tomorrow. She stayed in over the weekend to avoid air quality.  Review of Systems See HPI Constitutional:   No weight loss, night sweats,HEENT:   No headaches,  Difficulty swallowing,  Tooth/dental problems,                No sneezing, itching, ear ache,, post nasal drip,   CV:  No chest pain,  Orthopnea, PND, swelling in lower extremities, anasarca, dizziness, palpitations  GI  No heartburn, indigestion, abdominal pain, nausea, vomiting, diarrhea, change in bowel habits, loss of appetite  Resp:   No excess mucus,   No coughing up of blood.  Skin: no rash or lesions.  GU: no dysuria, change in color of urine, no urgency or frequency.  No flank pain.  MS:  No joint pain or swelling.  No decreased range of motion.  No back pain.  Psych:  No change in mood or affect. No depression or anxiety.  No memory loss.      Objective:   Physical Exam General- Alert, Oriented, Affect-appropriate, Distress- none acute   Oxygen 2-3 L/M- now 2  Skin- rash-none, lesions- none, excoriation- none  Lymphadenopathy- none  Head- atraumatic  Eyes- Gross vision intact, PERRLA, conjunctivae clear  secretions  Ears- Hearing normal  canals, Tm- normal  Nose- Clear,No-  Septal dev, mucus, polyps, erosion, perforation   Throat- Mallampati II , mucosa clear , drainage- none, tonsils- atrophic  Neck- flexible , trachea midline, no stridor , thyroid nl, carotid no bruit  Chest - symmetrical excursion , unlabored     Heart/CV- RRR- occasional slight delay w/o skip, 2/ 6 systolic left sternal border murmur , no gallop  , no rub, nl s1 s2                     - JVD- none , edema- none, stasis changes- none, varices- none     Lung-distant but clear, wheeze- none,  cough- upper airway rattle.  , dullness-none, rub- none     Chest wall-   Abd- tender-no, distended-no, bowel sounds-present, HSM- no  Br/ Gen/ Rectal- Not done, not indicated  Extrem- cyanosis- none, clubbing, none, atrophy- none, strength- nl  Neuro- grossly intact to observation        Assessment & Plan:

## 2011-05-21 NOTE — Patient Instructions (Signed)
Order- CBC- dx anemia  Pace yourself as best you can, and continue current meds.

## 2011-05-22 ENCOUNTER — Encounter: Payer: Self-pay | Admitting: Internal Medicine

## 2011-05-22 ENCOUNTER — Telehealth: Payer: Self-pay | Admitting: *Deleted

## 2011-05-22 NOTE — Telephone Encounter (Signed)
Per [name], ok to restart her iron if she has some and continue with recs from last OV regarding the hemoptysis.  Called, spoke with pt.  She was informed ok to restart her iron if she has some and continue recs from OV regarding coughing up the blood per [name].  Advised if symptoms get worse or do not improve to call back or seek emergency care.  Pt verbalized understanding of these instructions and voiced no further concerns at this time.

## 2011-05-22 NOTE — Telephone Encounter (Signed)
Called, spoke with pt.  She was informed of lab results per [name].  She verbalized understanding of this and stated last time she had to take iron for the low blood levels.  She would like to know if [name] would like her to start on this.  Also, states last night she coughed up a small amount of red blood -- requesting [name]'s recs on this.  Pls advise. Thanks!

## 2011-05-22 NOTE — Progress Notes (Signed)
Quick Note:  Called, spoke with pt. She was informed of lab results per [name]. She verbalized understanding of this and stated last time she had to take iron for the low blood levels. She would like to know if [name] would like her to start on this. Also, states last night she coughed up a small amount of red blood -- requesting [name]'s recs on this. Pls advise. Thanks!  Note: phone encounter created to address these issues. ______

## 2011-05-24 ENCOUNTER — Other Ambulatory Visit: Payer: Self-pay | Admitting: Cardiology

## 2011-05-25 ENCOUNTER — Other Ambulatory Visit: Payer: Self-pay | Admitting: *Deleted

## 2011-05-25 MED ORDER — EZETIMIBE 10 MG PO TABS: 10.0000 mg | ORAL_TABLET | Freq: Every day | ORAL | Status: DC

## 2011-05-25 NOTE — Telephone Encounter (Signed)
Fax received from pharmacy. Refill completed. Jodette Odysseus Cada RN  

## 2011-05-27 ENCOUNTER — Other Ambulatory Visit: Payer: Self-pay | Admitting: Internal Medicine

## 2011-05-31 ENCOUNTER — Ambulatory Visit (INDEPENDENT_AMBULATORY_CARE_PROVIDER_SITE_OTHER): Payer: Medicare Other | Admitting: *Deleted

## 2011-05-31 ENCOUNTER — Ambulatory Visit (INDEPENDENT_AMBULATORY_CARE_PROVIDER_SITE_OTHER): Payer: Medicare Other

## 2011-05-31 DIAGNOSIS — J309 Allergic rhinitis, unspecified: Secondary | ICD-10-CM

## 2011-05-31 DIAGNOSIS — I4891 Unspecified atrial fibrillation: Secondary | ICD-10-CM

## 2011-05-31 LAB — POCT INR: INR: 3.9

## 2011-06-01 ENCOUNTER — Encounter: Payer: Medicare Other | Admitting: *Deleted

## 2011-06-05 ENCOUNTER — Other Ambulatory Visit: Payer: Self-pay | Admitting: Internal Medicine

## 2011-06-07 ENCOUNTER — Encounter: Payer: Medicare Other | Admitting: *Deleted

## 2011-06-08 ENCOUNTER — Ambulatory Visit (INDEPENDENT_AMBULATORY_CARE_PROVIDER_SITE_OTHER): Payer: Medicare Other

## 2011-06-08 DIAGNOSIS — J309 Allergic rhinitis, unspecified: Secondary | ICD-10-CM

## 2011-06-11 ENCOUNTER — Telehealth: Payer: Self-pay | Admitting: Internal Medicine

## 2011-06-11 MED ORDER — METHYLPREDNISOLONE 4 MG PO TABS: 4.0000 mg | ORAL_TABLET | Freq: Every day | ORAL | Status: DC

## 2011-06-11 NOTE — Telephone Encounter (Signed)
Per CY-okay to try taking Methylprednisolone 4mg  daily #30 1 daily with 2 refills.   Pt is aware and Rx sent.

## 2011-06-11 NOTE — Telephone Encounter (Signed)
Spoke with pt. She states that she was started on methylprednisolone 8 mg qd on 05/15/11. She states was only given enough to last 2 wks with 1 refill. Now she is almost out and wants to know if she should stay on this. She states that her breathing has been doing well. Please advise, thanks!

## 2011-06-12 ENCOUNTER — Encounter: Payer: Self-pay | Admitting: *Deleted

## 2011-06-14 ENCOUNTER — Ambulatory Visit (INDEPENDENT_AMBULATORY_CARE_PROVIDER_SITE_OTHER): Payer: Medicare Other

## 2011-06-14 ENCOUNTER — Ambulatory Visit (INDEPENDENT_AMBULATORY_CARE_PROVIDER_SITE_OTHER): Payer: Medicare Other | Admitting: *Deleted

## 2011-06-14 DIAGNOSIS — I4891 Unspecified atrial fibrillation: Secondary | ICD-10-CM

## 2011-06-14 DIAGNOSIS — Z7901 Long term (current) use of anticoagulants: Secondary | ICD-10-CM

## 2011-06-14 DIAGNOSIS — J309 Allergic rhinitis, unspecified: Secondary | ICD-10-CM

## 2011-06-14 LAB — POCT INR: INR: 3.2

## 2011-06-20 ENCOUNTER — Telehealth: Payer: Self-pay | Admitting: Internal Medicine

## 2011-06-20 NOTE — Telephone Encounter (Signed)
Tried to send remote yesterday and was unable to transmit.  Please call her to help her with this and to give instructions.

## 2011-06-21 ENCOUNTER — Ambulatory Visit (INDEPENDENT_AMBULATORY_CARE_PROVIDER_SITE_OTHER): Payer: Medicare Other

## 2011-06-21 ENCOUNTER — Ambulatory Visit (INDEPENDENT_AMBULATORY_CARE_PROVIDER_SITE_OTHER): Payer: Medicare Other | Admitting: *Deleted

## 2011-06-21 ENCOUNTER — Other Ambulatory Visit: Payer: Self-pay | Admitting: Internal Medicine

## 2011-06-21 DIAGNOSIS — Z950 Presence of cardiac pacemaker: Secondary | ICD-10-CM

## 2011-06-21 DIAGNOSIS — I4891 Unspecified atrial fibrillation: Secondary | ICD-10-CM

## 2011-06-21 DIAGNOSIS — J309 Allergic rhinitis, unspecified: Secondary | ICD-10-CM

## 2011-06-21 NOTE — Telephone Encounter (Signed)
I tried to explain directions to pt, but she had a very hard time understanding.  I gave her the phone number to [employer], so they could help her trouble shoot.

## 2011-06-22 LAB — REMOTE PACEMAKER DEVICE
AL AMPLITUDE: 2.9 mv
BAMS-0001: 170 {beats}/min
RV LEAD AMPLITUDE: 5.3 mv

## 2011-06-27 ENCOUNTER — Ambulatory Visit (INDEPENDENT_AMBULATORY_CARE_PROVIDER_SITE_OTHER): Payer: Medicare Other

## 2011-06-27 DIAGNOSIS — J309 Allergic rhinitis, unspecified: Secondary | ICD-10-CM

## 2011-06-28 ENCOUNTER — Ambulatory Visit (INDEPENDENT_AMBULATORY_CARE_PROVIDER_SITE_OTHER): Payer: Medicare Other | Admitting: *Deleted

## 2011-06-28 ENCOUNTER — Ambulatory Visit: Payer: Medicare Other | Admitting: Internal Medicine

## 2011-06-28 ENCOUNTER — Ambulatory Visit (INDEPENDENT_AMBULATORY_CARE_PROVIDER_SITE_OTHER): Payer: Medicare Other

## 2011-06-28 DIAGNOSIS — I4891 Unspecified atrial fibrillation: Secondary | ICD-10-CM

## 2011-06-28 DIAGNOSIS — Z7901 Long term (current) use of anticoagulants: Secondary | ICD-10-CM

## 2011-06-28 DIAGNOSIS — J309 Allergic rhinitis, unspecified: Secondary | ICD-10-CM

## 2011-06-28 LAB — POCT INR: INR: 1.6

## 2011-06-29 ENCOUNTER — Other Ambulatory Visit: Payer: Self-pay | Admitting: Internal Medicine

## 2011-06-29 NOTE — Progress Notes (Signed)
Pacer remote check  

## 2011-07-02 NOTE — Telephone Encounter (Signed)
Dr. Lawerance Sabal patient have refill of her Zoloft?  Please advise-thanks.

## 2011-07-03 ENCOUNTER — Ambulatory Visit (INDEPENDENT_AMBULATORY_CARE_PROVIDER_SITE_OTHER): Payer: Medicare Other

## 2011-07-03 DIAGNOSIS — J309 Allergic rhinitis, unspecified: Secondary | ICD-10-CM

## 2011-07-06 ENCOUNTER — Other Ambulatory Visit: Payer: Self-pay | Admitting: Internal Medicine

## 2011-07-09 ENCOUNTER — Other Ambulatory Visit: Payer: Self-pay | Admitting: Internal Medicine

## 2011-07-11 ENCOUNTER — Ambulatory Visit (INDEPENDENT_AMBULATORY_CARE_PROVIDER_SITE_OTHER): Payer: Medicare Other | Admitting: *Deleted

## 2011-07-11 ENCOUNTER — Ambulatory Visit (INDEPENDENT_AMBULATORY_CARE_PROVIDER_SITE_OTHER): Payer: Medicare Other

## 2011-07-11 DIAGNOSIS — I4891 Unspecified atrial fibrillation: Secondary | ICD-10-CM

## 2011-07-11 DIAGNOSIS — Z7901 Long term (current) use of anticoagulants: Secondary | ICD-10-CM

## 2011-07-11 DIAGNOSIS — J309 Allergic rhinitis, unspecified: Secondary | ICD-10-CM

## 2011-07-11 LAB — POCT INR: INR: 2.3

## 2011-07-12 ENCOUNTER — Ambulatory Visit (INDEPENDENT_AMBULATORY_CARE_PROVIDER_SITE_OTHER): Payer: Medicare Other | Admitting: Internal Medicine

## 2011-07-12 ENCOUNTER — Encounter: Payer: Medicare Other | Admitting: *Deleted

## 2011-07-12 DIAGNOSIS — R42 Dizziness and giddiness: Secondary | ICD-10-CM

## 2011-07-12 DIAGNOSIS — R1013 Epigastric pain: Secondary | ICD-10-CM

## 2011-07-12 DIAGNOSIS — I350 Nonrheumatic aortic (valve) stenosis: Secondary | ICD-10-CM

## 2011-07-12 DIAGNOSIS — IMO0002 Reserved for concepts with insufficient information to code with codable children: Secondary | ICD-10-CM

## 2011-07-12 DIAGNOSIS — D649 Anemia, unspecified: Secondary | ICD-10-CM

## 2011-07-12 LAB — BASIC METABOLIC PANEL
Calcium: 9 mg/dL (ref 8.4–10.5)
GFR: 74.62 mL/min (ref >60.00)
Glucose, Bld: 120 mg/dL — ABNORMAL HIGH (ref 70–99)
Sodium: 138 mEq/L (ref 135–145)

## 2011-07-12 LAB — CBC WITH DIFFERENTIAL/PLATELET
Basophils Absolute: 0 10*3/uL (ref 0.0–0.1)
Lymphocytes Relative: 12 % (ref 12.0–46.0)
Lymphs Abs: 1.2 10*3/uL (ref 0.7–4.0)
Monocytes Relative: 8.4 % (ref 3.0–12.0)
Neutrophils Relative %: 78.7 % — ABNORMAL HIGH (ref 43.0–77.0)
Platelets: 196 10*3/uL (ref 150.0–400.0)
RDW: 23.3 % — ABNORMAL HIGH (ref 11.5–14.6)

## 2011-07-12 NOTE — Progress Notes (Signed)
  Subjective:    Patient ID: [name], female    DOB: Sep 27, 1924, 77 y.o.   MRN: [id number]  HPI Dizziness Onset: > 12 months but more frequent Context: Position change:isometrics help symptoms pre standing; mainly straightening up after waist flexion; also if hungry or if exposed to bright light suddenly Benign positional vertigo symptoms:no Straining:no Pain:no Cardiac prodrome: no palpitations, irregular rhythm, heart rate change Neurologic prodromen:no  headache, numbness and tingling, weakness Syncope:no (she has pacer) Seizure activity:no Upper respiratory tract infection/extrinsic symptoms:no Duration: 5 minutes Frequency:1-2X/week or more Associated signs and symptoms: Visual change (blurred/double/loss):vision blurred Hearing loss/tinnitus:no Nausea/sweating:no Chest pain:no Dyspnea:not a trigger but she is chronically SOB Treatment/response: iron helped in past      Review of Systems she describes dyspepsia and upper abdominal discomfort. Stool was  dark while on iron. She denies rectal bleeding.  She has been using antibiotic ointment on her elbows and the thumb dermatitis.     Objective:   Physical Exam     Gen. appearance: Well-nourished, in no distress; on O2 Eyes: Extraocular motion intact, field of vision normal, vision grossly intact, no nystagmus Ptosis OD > OS ENT: Canals clear, tympanic membranes normal, tuning fork exam normal, hearing grossly  Decreased L > R Neck: Normal range of motion, no masses, normal thyroid Cardiovascular: Rate and rhythm normal; no gallops or extra heart sounds. Grade 1.5-2 honking L base murmur. Neck radiation vs. Carotid bruits Muscle skeletal: OA DIP changes, tone, &  strength normal Neuro:no cranial nerve deficit, deep tendon  reflexes normal, gait normal. Negative nose testing and Romberg testing are negative. Lymph: No cervical or axillary LA Skin: Dry skin over elbows  without suspicious lesions or  rashes.Erythematous changes R thumb base w/o increased temp Psych: no anxiety or mood change. Normally interactive and cooperative.         Assessment & Plan:  #1 dizziness; she's had postural hypotension but this has responded to isometric exercises. The main trigger this time may be positional change, rising from waist flexion.  #2 paronychia right thumb  #3 significant murmur; cardiology evaluation is pending.  Plan: See orders and recommendations.

## 2011-07-12 NOTE — Patient Instructions (Addendum)
Please get the tool which allows you  to pick up items from the floor without squatting or bending at the waist.  Use Nizoral mixed with Eucerin( 1 part to  1 part) twice a day to the base of the palm. Use the skin moisturizer to the elbows twice a day as needed. Keep thumb as dry as possible.

## 2011-07-17 ENCOUNTER — Other Ambulatory Visit: Payer: Self-pay | Admitting: Cardiovascular Disease

## 2011-07-17 DIAGNOSIS — I951 Orthostatic hypotension: Secondary | ICD-10-CM

## 2011-07-17 DIAGNOSIS — I4891 Unspecified atrial fibrillation: Secondary | ICD-10-CM

## 2011-07-17 DIAGNOSIS — I350 Nonrheumatic aortic (valve) stenosis: Secondary | ICD-10-CM

## 2011-07-17 DIAGNOSIS — E785 Hyperlipidemia, unspecified: Secondary | ICD-10-CM

## 2011-07-18 ENCOUNTER — Ambulatory Visit (INDEPENDENT_AMBULATORY_CARE_PROVIDER_SITE_OTHER): Payer: Medicare Other

## 2011-07-18 DIAGNOSIS — J309 Allergic rhinitis, unspecified: Secondary | ICD-10-CM

## 2011-07-19 ENCOUNTER — Encounter: Payer: Self-pay | Admitting: Cardiovascular Disease

## 2011-07-19 ENCOUNTER — Other Ambulatory Visit: Payer: Self-pay | Admitting: Cardiovascular Disease

## 2011-07-19 ENCOUNTER — Other Ambulatory Visit (INDEPENDENT_AMBULATORY_CARE_PROVIDER_SITE_OTHER): Payer: Medicare Other | Admitting: *Deleted

## 2011-07-19 ENCOUNTER — Ambulatory Visit (INDEPENDENT_AMBULATORY_CARE_PROVIDER_SITE_OTHER): Payer: Medicare Other | Admitting: Cardiovascular Disease

## 2011-07-19 ENCOUNTER — Ambulatory Visit: Payer: Medicare Other | Admitting: Cardiovascular Disease

## 2011-07-19 VITALS — BP 122/60 | HR 60 | Ht 62.5 in | Wt 130.0 lb

## 2011-07-19 DIAGNOSIS — I4891 Unspecified atrial fibrillation: Secondary | ICD-10-CM

## 2011-07-19 DIAGNOSIS — I359 Nonrheumatic aortic valve disorder, unspecified: Secondary | ICD-10-CM

## 2011-07-19 DIAGNOSIS — E785 Hyperlipidemia, unspecified: Secondary | ICD-10-CM

## 2011-07-19 DIAGNOSIS — I350 Nonrheumatic aortic (valve) stenosis: Secondary | ICD-10-CM

## 2011-07-19 DIAGNOSIS — I951 Orthostatic hypotension: Secondary | ICD-10-CM

## 2011-07-19 LAB — LIPID PANEL
HDL: 60.7 mg/dL (ref >39.00)
Triglycerides: 135 mg/dL (ref 0.0–149.0)

## 2011-07-19 LAB — BASIC METABOLIC PANEL
BUN: 12 mg/dL (ref 6–23)
Calcium: 9.1 mg/dL (ref 8.4–10.5)
GFR: 90.48 mL/min (ref >60.00)
Glucose, Bld: 121 mg/dL — ABNORMAL HIGH (ref 70–99)
Sodium: 142 mEq/L (ref 135–145)

## 2011-07-19 LAB — HEPATIC FUNCTION PANEL
Bilirubin, Direct: 0.1 mg/dL (ref 0.0–0.3)
Total Protein: 6.4 g/dL (ref 6.0–8.3)

## 2011-07-19 NOTE — Progress Notes (Signed)
[name] Date of Birth  1924-05-05 [facility] / [facility] [address]     [address] [place], [place]  [address] [phone]  Fax  [phone]  History of Present Illness:  77 year old female with a history of COPD. She has a history of aortic valve replacement.  She also has a pacemaker.  She is chronically short of breath. She is on chronic home oxygen at 3 L per minute. She notes that her oxygen levels drop when she walks or uses her arms.  She's had some episodes of dizziness.  She has had some bruising in her legs.  She has noticed that she becomes forgetful when she takes statins.  Current Outpatient Prescriptions on File Prior to Visit  Medication Sig Dispense Refill  . Alum & Mag Hydroxide-Simeth (MAGIC MOUTHWASH) SOLN Take 5 mLs by mouth 4 (four) times daily as needed. 1 tsp swish and swallow four times a day       . Ascorbic Acid (VITAMIN C) 500 MG tablet Take 500 mg by mouth daily.        . chlorpheniramine-HYDROcodone (TUSSIONEX PENNKINETIC ER) 10-8 MG/5ML LQCR Take 5 mLs by mouth every 12 (twelve) hours as needed.        . digoxin (LANOXIN) 0.125 MG tablet Take 1 tablet by mouth Daily.      Marland Kitchen diltiazem (CARDIZEM LA) 240 MG 24 hr tablet Take 240 mg by mouth daily.        . diphenhydrAMINE (CHILDRENS ALLERGY) 12.5 MG/5ML liquid Take 12.5 mg by mouth as directed.        [name] Calcium (STOOL SOFTENER PO) Take by mouth daily.        . DULERA 200-5 MCG/ACT AERO USE 2 PUFFS TWO TIMES A DAY AS NEEDED  1 Inhaler  6  . ezetimibe (ZETIA) 10 MG tablet Take 1 tablet (10 mg total) by mouth daily.  30 tablet  5  . furosemide (LASIX) 40 MG tablet Take 20 mg by mouth 2 (two) times daily. Take 1/2 of 40 mg twice daily       . Guaifenesin (MUCINEX MAXIMUM STRENGTH) 1200 MG TB12 Take 1,200 mg by mouth 2 (two) times daily at 10 AM and 5 PM.        . levalbuterol (XOPENEX HFA) 45 MCG/ACT inhaler Inhale 2 puffs into the lungs every 6 (six) hours as  needed.        . levalbuterol (XOPENEX) 1.25 MG/3ML nebulizer solution Take 1 ampule by nebulization 3 (three) times daily. DX:  493.20       . methylPREDNISolone (MEDROL) 4 MG tablet Take 4 mg by mouth daily.        . Mometasone Furo-Formoterol Fum (DULERA) 200-5 MCG/ACT AERO Inhale 2 puffs into the lungs 2 (two) times daily.  1 Inhaler  3  . Multiple Minerals-Vitamins (CALCIUM CITRATE +) TABS Take by mouth.        . Multiple Vitamin (MULTIVITAMINS PO) Take by mouth. 1 by mouth daily       . NEXIUM 40 MG capsule TAKE 1 TABLET BY MOUTH ONCE A DAY  90 capsule  0  . OXYGEN-HELIUM IN Inhale into the lungs. 3.5 liters 24/7      . potassium chloride (KLOR-CON) 20 MEQ packet Take 20 mEq by mouth 2 (two) times daily.        . sertraline (ZOLOFT) 50 MG tablet TAKE 1 AND 1/2 TABLETS AT BEDTIME  45 tablet  2  .  SINGULAIR 10 MG tablet TAKE 1 TABLET DAILY FOR ASTHMA  30 tablet  2  . sodium chloride ([employer] SALINE NASAL SPRAY) 0.65 % nasal spray 1 spray by Nasal route as needed.        Marland Kitchen Spacer/Aero-Holding Chambers (AEROCHAMBER Z-STAT PLUS CHAMBR) MISC by Does not apply route. Use with inhaler as directed       . SPIRIVA HANDIHALER 18 MCG inhalation capsule INHALE CONTENTS OF 1 CAPSULE ONCE A DAY  30 each  5  . vitamin B-12 (CYANOCOBALAMIN) 100 MCG tablet Take 100 mcg by mouth daily.        Marland Kitchen warfarin (COUMADIN) 1 MG tablet Take 1 mg by mouth as directed.        . sotalol (BETAPACE) 80 MG tablet Take 80 mg by mouth 2 (two) times daily.        Marland Kitchen DISCONTD: levalbuterol (XOPENEX) 1.25 MG/3ML nebulizer solution Take 3 mLs (1.25 mg total) by nebulization every 8 (eight) hours as needed. DX:  493.20  810 mL  3    Allergies  Allergen Reactions  . Cephalexin   . Codeine   . Estradiol   . Hydrocortisone   . Moxifloxacin   . Sulfonamide Derivatives     itching    Past Medical History  Diagnosis Date  . Asthmatic bronchitis   . Allergic rhinitis   . Atrial fibrillation   . History of aortic valve  replacement Dr. Tyrone Sage 2006    Has severe residual gradient and will be managed conservatively  . Sleep apnea   . Chronic obstructive asthma   . Osteoporosis   . Chronic fatigue   . Pruritic disorder   . Diverticulosis   . Hemorrhoids   . GERD (gastroesophageal reflux disease)   . IBS (irritable bowel syndrome)   . Pacemaker 2010  . Oxygen dependent   . Atrial flutter     on Betapace  . High risk medication use     on Betapace & Coumadin  . Chronic anticoagulation     Past Surgical History  Procedure Date  . Back surgery 1994  . Tonsillectomy   . Appendectomy   . Ganglion cyst excision   . Vocal cord polyps 1975 and 1984  . Aortic valve replacement 2006  . Pacemaker insertion 2010  . Cardiac electrophysiology mapping and ablation     History  Smoking status  . Former Smoker -- 1.0 packs/day for 50 years  . Types: Cigarettes  . Quit date: 11/05/1993  Smokeless tobacco  . Never Used    History  Alcohol Use No    Family History  Problem Relation Age of Onset  . Kidney failure Father   . Colon cancer Paternal Aunt   . Heart disease Maternal Grandfather   . Heart attack Paternal Grandfather   . Breast cancer Daughter   . Stroke Maternal Grandmother   . Heart disease Mother     Reviw of Systems:  Reviewed in the HPI.  All other systems are negative.  Physical Exam: BP 122/60  Pulse 60  Ht 5' 2.5" (1.588 m)  Wt 130 lb (58.968 kg)  BMI 23.40 kg/m2 The patient is alert and oriented x 3.  The mood and affect are normal.   Skin: warm and dry.  Color is normal.    HEENT:   the sclera are nonicteric.  The mucous membranes are moist.  The carotids are 2+ without bruits.  There is no thyromegaly.  There is no JVD.  Lungs: clear.  The chest wall is non tender.    Heart: regular rate with a normal S1 and S2.  There is a 2/6 systolic murmur at the left sternal border. The PMI is not displaced.     Abdomen: good bowel sounds.  There is no guarding or  rebound.  There is no hepatosplenomegaly or tenderness.  There are no masses.   Extremities:  no clubbing, cyanosis, or edema.  The legs are without rashes.  The distal pulses are slightly diminished.   Neuro:  Cranial nerves II - XII are intact.  Motor and sensory functions are intact.    The gait is normal.   Assessment / Plan:

## 2011-07-19 NOTE — Assessment & Plan Note (Addendum)
Shows loss of shortness of breath. I suspect most of this is due to her COPD but surface some of it could be coming from her aortic stenosis.  She has moderate to severe aortic stenosis. Her pulses are slightly delayed but overall her pretty good.  I do not think that she is a good candidate for redo aortic valve replacement. We discussed this fact  and she agrees.

## 2011-07-23 ENCOUNTER — Encounter: Payer: Self-pay | Admitting: Internal Medicine

## 2011-07-23 ENCOUNTER — Ambulatory Visit (INDEPENDENT_AMBULATORY_CARE_PROVIDER_SITE_OTHER): Payer: Medicare Other

## 2011-07-23 ENCOUNTER — Ambulatory Visit (INDEPENDENT_AMBULATORY_CARE_PROVIDER_SITE_OTHER): Payer: Medicare Other | Admitting: Internal Medicine

## 2011-07-23 VITALS — BP 122/74 | HR 63 | Ht 62.5 in | Wt 132.2 lb

## 2011-07-23 DIAGNOSIS — I350 Nonrheumatic aortic (valve) stenosis: Secondary | ICD-10-CM

## 2011-07-23 DIAGNOSIS — J301 Allergic rhinitis due to pollen: Secondary | ICD-10-CM

## 2011-07-23 DIAGNOSIS — J449 Chronic obstructive pulmonary disease, unspecified: Secondary | ICD-10-CM

## 2011-07-23 DIAGNOSIS — I359 Nonrheumatic aortic valve disorder, unspecified: Secondary | ICD-10-CM

## 2011-07-23 DIAGNOSIS — Z23 Encounter for immunization: Secondary | ICD-10-CM

## 2011-07-23 DIAGNOSIS — J309 Allergic rhinitis, unspecified: Secondary | ICD-10-CM

## 2011-07-23 NOTE — Progress Notes (Signed)
Subjective:   HPI          Review of Systems    Objective:   Physical Exam  Subjective:   Objective:   Assessment & Plan:   Subjective:    Objective:   Assessment & Plan:   Subjective:    Patient ID: [name], female    DOB: 1924/09/03, 77 y.o.   MRN: [id number]  HPI 03/29/11- 89 yoF former smoker followed for COPD, chronic hypoxic respiratory failure, complicated by atrial fibrillation, valvular heart disease and intermittent hemoptysis. Daughter here. Last here January 19, 2011, feeling dizzy and brown sputum. She did get better. Then at end of March she took some left over prednsione for dyspnea with throat irritation. Now, 1 week ago she noted increased nasal discharge- mucus and blood. 3 days ago she began coughing more, with increasing nasal congestion. By yesterday she mostly stayed in bed because of weakness. She thinks she had fever yesterday. Sputum now green.   05/15/11- 40 yoF former smoker followed for COPD, chronic hypoxic respiratory failure, complicated by atrial fibrillation, valvular heart disease and intermittent hemoptysis. Acute visit- We had sent script for prednisone. She can't stay off prednisone, at least 10 mg daily, and complains of weight gain in abdomen. Dyspnea walking hall at home on 3.5 L/m O2 has to pace herself. Ok sitting still. Reaching and lifting with arms is especially hard. Family visits to help with chores. Some soreness under ribs if she leans over. Coughs up clear mucus- sometimes green or dark yellow. Something caused itching ? Doxycycline.  05/21/11-86 yoF former smoker followed for COPD, chronic hypoxic respiratory failure, complicated by atrial fibrillation, valvular heart disease and intermittent hemoptysis. At last visit we gave augmentin and medrol 8 mg daily.. After a few days she was feeling better. Over next few days she was more short of breath, and quite winded today after coming alone, carrying her O2. Sputum is ligther yellow.  Finishes augmentin tomorrow. She stayed in over the weekend to avoid air quality.  07/23/2011-86 yoF former smoker followed for COPD, chronic hypoxic respiratory failure, complicated by atrial fibrillation, valvular heart disease and intermittent hemoptysis. She blames rain and pollen this week for nasal congestion. Minor epistaxis despite saline spray. Short of breath with her reading but that's not really different. Coughing a little scant yellow sputum, not bad. This happened about the time she changed to generic Singulair so she's not sure if it's a medication effect but does not feel as if she has cold. Continues oxygen at 3-3.5 L.  Review of Systems See HPI Constitutional:   No weight loss, night sweats,HEENT:   No headaches,  Difficulty swallowing,  Tooth/dental problems,                No sneezing, itching, ear ache,, post nasal drip,  CV:  No chest pain,  Orthopnea, PND, swelling in lower extremities, anasarca, dizziness, palpitations GI  No heartburn, indigestion, abdominal pain, nausea, vomiting, diarrhea, change in bowel habits, loss of appetite Resp:   No excess mucus,   No coughing up of blood.  Skin: no rash or lesions. GU: no dysuria, change in color of urine, no urgency or frequency.  No flank pain. MS:  No joint pain or swelling.  No decreased range of motion.  No back pain. Psych:  No change in mood or affect. No depression or anxiety.  No memory loss.   Objective:   Physical ExamSATURATION QUALIFICATIONS: Patient Saturations on Room Air at Rest =  83% Patient Saturations on Room Air while Ambulating = 82% Patient Saturations on 3 Liters of oxygen while Ambulating = 92%---Benna Arno,CMA General- Alert, Oriented, Affect-appropriate, Distress- none acute   O2 3 L/M Skin- rash-none, lesions- none, excoriation- none Lymphadenopathy- none Head- atraumatic            Eyes- Gross vision intact, PERRLA, conjunctivae clear secretions            Ears- Hearing, canals-normal for  age            Nose- Clear, no-Septal dev, mucus, polyps, erosion, perforation             Throat- Mallampati II-III , mucosa clear , drainage- none, tonsils- atrophic Neck- flexible , trachea midline, no stridor , thyroid nl, carotid no bruit Chest - symmetrical excursion , unlabored           Heart/CV- RRR , 2/6 systolic LSB murmur , no gallop  , no rub, nl s1 s2                           - JVD- 1cm , edema- none, stasis changes- none, varices- none           Lung- clear to P&A, distant.   wheeze- none, cough- none , dullness-none, rub- none           Chest wall-  Abd- tender-no, distended-no, bowel sounds-present, HSM- no Br/ Gen/ Rectal- Not done, not indicated Extrem- cyanosis- none, clubbing, none, atrophy- none, strength- nl Neuro- grossly intact to observation  Assessment & Plan:     Assessment & Plan:

## 2011-07-23 NOTE — Patient Instructions (Signed)
Follow up in 2 months. Please call as needed  Flu Vax  Try Saline gel

## 2011-07-25 ENCOUNTER — Ambulatory Visit (INDEPENDENT_AMBULATORY_CARE_PROVIDER_SITE_OTHER): Payer: Medicare Other | Admitting: *Deleted

## 2011-07-25 DIAGNOSIS — I4891 Unspecified atrial fibrillation: Secondary | ICD-10-CM

## 2011-07-25 DIAGNOSIS — Z7901 Long term (current) use of anticoagulants: Secondary | ICD-10-CM

## 2011-07-28 ENCOUNTER — Encounter: Payer: Self-pay | Admitting: Internal Medicine

## 2011-07-28 NOTE — Assessment & Plan Note (Signed)
She chooses to repeat brain on allergy vaccine. I think we can assume values declining for her to someone to give up any crutches now. We discussed these choices again today.

## 2011-07-28 NOTE — Assessment & Plan Note (Signed)
Chronic hypoxic respiratory failure. It is understood that her oxygen dependence and dyspnea are partly restart tori the significant component related to her aortic stenosis and weakness. I think she is close to her baseline today.

## 2011-07-28 NOTE — Assessment & Plan Note (Signed)
I do not hear a change in her murmur. She is not describing angina or syncope.

## 2011-07-30 LAB — PROTIME-INR
INR: 2 — ABNORMAL HIGH
Prothrombin Time: 23.2 — ABNORMAL HIGH

## 2011-07-30 LAB — DIFFERENTIAL
Basophils Absolute: 0
Basophils Relative: 0
Eosinophils Absolute: 0
Eosinophils Relative: 0
Lymphocytes Relative: 6 — ABNORMAL LOW
Lymphs Abs: 0.5 — ABNORMAL LOW
Monocytes Absolute: 0.4
Monocytes Relative: 4
Neutro Abs: 8.1 — ABNORMAL HIGH
Neutrophils Relative %: 90 — ABNORMAL HIGH

## 2011-07-30 LAB — CBC
HCT: 39.7
Hemoglobin: 13.2
MCHC: 33.3
MCV: 74.8 — ABNORMAL LOW
Platelets: 193
RBC: 5.31 — ABNORMAL HIGH
RDW: 16.5 — ABNORMAL HIGH
WBC: 9

## 2011-07-30 LAB — URINALYSIS, ROUTINE W REFLEX MICROSCOPIC
Bilirubin Urine: NEGATIVE
Glucose, UA: NEGATIVE
Hgb urine dipstick: NEGATIVE
Ketones, ur: 15 — AB
Nitrite: NEGATIVE
Protein, ur: NEGATIVE
Specific Gravity, Urine: 1.02
Urobilinogen, UA: 0.2
pH: 5.5

## 2011-07-30 LAB — BASIC METABOLIC PANEL
BUN: 14
CO2: 26
Chloride: 101
GFR calc non Af Amer: >60
Glucose, Bld: 100 — ABNORMAL HIGH
Potassium: 3.3 — ABNORMAL LOW
Sodium: 137

## 2011-07-30 LAB — URINE CULTURE: Colony Count: 35000

## 2011-07-30 LAB — DIGOXIN LEVEL: Digoxin Level: 0.4 — ABNORMAL LOW

## 2011-07-30 LAB — APTT: aPTT: 46 — ABNORMAL HIGH

## 2011-07-30 LAB — BASIC METABOLIC PANEL WITH GFR
Calcium: 8.8
Creatinine, Ser: 0.76
GFR calc Af Amer: >60

## 2011-08-01 ENCOUNTER — Ambulatory Visit (INDEPENDENT_AMBULATORY_CARE_PROVIDER_SITE_OTHER): Payer: Medicare Other

## 2011-08-01 DIAGNOSIS — J309 Allergic rhinitis, unspecified: Secondary | ICD-10-CM

## 2011-08-02 ENCOUNTER — Encounter: Payer: Self-pay | Admitting: *Deleted

## 2011-08-03 ENCOUNTER — Encounter: Payer: Self-pay | Admitting: Internal Medicine

## 2011-08-08 ENCOUNTER — Ambulatory Visit (INDEPENDENT_AMBULATORY_CARE_PROVIDER_SITE_OTHER): Payer: Medicare Other

## 2011-08-08 DIAGNOSIS — J309 Allergic rhinitis, unspecified: Secondary | ICD-10-CM

## 2011-08-14 ENCOUNTER — Other Ambulatory Visit: Payer: Self-pay | Admitting: *Deleted

## 2011-08-14 MED ORDER — FUROSEMIDE 40 MG PO TABS: 20.0000 mg | ORAL_TABLET | Freq: Two times a day (BID) | ORAL | Status: DC

## 2011-08-16 ENCOUNTER — Ambulatory Visit (INDEPENDENT_AMBULATORY_CARE_PROVIDER_SITE_OTHER): Payer: Medicare Other

## 2011-08-16 DIAGNOSIS — J309 Allergic rhinitis, unspecified: Secondary | ICD-10-CM

## 2011-08-22 ENCOUNTER — Ambulatory Visit (INDEPENDENT_AMBULATORY_CARE_PROVIDER_SITE_OTHER): Payer: Medicare Other | Admitting: Internal Medicine

## 2011-08-22 ENCOUNTER — Encounter: Payer: Self-pay | Admitting: Internal Medicine

## 2011-08-22 ENCOUNTER — Encounter: Payer: Medicare Other | Admitting: *Deleted

## 2011-08-22 DIAGNOSIS — L03119 Cellulitis of unspecified part of limb: Secondary | ICD-10-CM

## 2011-08-22 DIAGNOSIS — I4891 Unspecified atrial fibrillation: Secondary | ICD-10-CM

## 2011-08-22 DIAGNOSIS — S8010XA Contusion of unspecified lower leg, initial encounter: Secondary | ICD-10-CM

## 2011-08-22 DIAGNOSIS — T148XXA Other injury of unspecified body region, initial encounter: Secondary | ICD-10-CM

## 2011-08-22 DIAGNOSIS — K649 Unspecified hemorrhoids: Secondary | ICD-10-CM

## 2011-08-22 DIAGNOSIS — Z7901 Long term (current) use of anticoagulants: Secondary | ICD-10-CM

## 2011-08-22 DIAGNOSIS — IMO0002 Reserved for concepts with insufficient information to code with codable children: Secondary | ICD-10-CM

## 2011-08-22 MED ORDER — HYDROCORTISONE ACE-PRAMOXINE 1-1 % RE FOAM: 1.0000 | Freq: Two times a day (BID) | RECTAL | Status: AC

## 2011-08-22 MED ORDER — DOXYCYCLINE HYCLATE 50 MG PO CAPS: 100.0000 mg | ORAL_CAPSULE | Freq: Two times a day (BID) | ORAL | Status: AC

## 2011-08-22 NOTE — Patient Instructions (Addendum)
Take warfarin 1 mg  One Half pill TODAY & every Monday, Wednesday, Friday. Take 1 mg other days. Recheck PT/INR in 10-14 days at the Coumadin clinic. Apply the Proctofoam HC twice a day after Sitz bath.

## 2011-08-22 NOTE — Progress Notes (Signed)
  Subjective:    Patient ID: [name], female    DOB: 08-18-1924, 77 y.o.   MRN: [id number]  HPI Extremity pain Location:#1  L elbow & #2  R lateral shin as bruising Onset:#1 three mos ago ; #2 since 10/11 Trigger/injury:#1 no; #2 no Pain quality:#1 tender to touch ; some aching Pain severity:up to  5 Duration:aching for hrs Radiationno Exacerbating factors:pressure Treatment/response:lotion (OTC) Review of systems: Constitutional: no fever, chills, sweats  Musculoskeletal:no  muscle cramps or pain; no  joint stiffness but  Redness& swelling Skin:hot pink color change Heme:no lymphadenopathy; abnormal bruising & bleeding on warfarin PT/INR was 2.5 one mo ago; due today)  She has no history of MRSA infections      Review of Systems  She has had some enlargement of hemorrhoids without frank bleeding.     Objective:   Physical Exam   She appears well-nourished and in no acute distress; she is uncomfortable due to 2 left elbow issue  She has no lymphadenopathy about the head, neck, axilla, or epitrochlear areas  There is an area of 10 X 9 centimeter erythema, fluctuance and tenderness over the left elbow area. There is slight eschar formation over both elbows  Radial artery pulses are intact  She has osteoarthritic changes of the fingers. She has no cyanosis, clubbing or edema.  Pedal pulses are decreased; no ischemic changes are noted  She has irregular ecchymotic lesions over the right lateral shin. There is no erythema or temperature change        Assessment & Plan:  #1 cellulitis and possible abscess left elbow  #2 multiple drug allergies or intolerances including sulfa, cephalexin, and moxifloxacin. She states she can take penicillin  #3 ecchymosis right lower extremity related to warfarin therapy, steroids chronically  and lack of subcutaneous tissues as buffer to trauma  in this area. PT/INR today  Plan: #1 she should be seen by an orthopedic  specialist and the elbow lesion incised drained and cultured. Pending return of the cultures; Doxycycline would be appropriate.  #2 INR will need to be monitored closely while on antibiotics.  #3 avoidance of trauma to the shin will be the only intervention possible and keeping the PT/INR in the low therapeutic range.  #4 elbow pads are option once the infection has been resolved.

## 2011-08-29 ENCOUNTER — Ambulatory Visit (INDEPENDENT_AMBULATORY_CARE_PROVIDER_SITE_OTHER): Payer: Medicare Other | Admitting: *Deleted

## 2011-08-29 ENCOUNTER — Ambulatory Visit (INDEPENDENT_AMBULATORY_CARE_PROVIDER_SITE_OTHER): Payer: Medicare Other

## 2011-08-29 DIAGNOSIS — Z7901 Long term (current) use of anticoagulants: Secondary | ICD-10-CM

## 2011-08-29 DIAGNOSIS — I4891 Unspecified atrial fibrillation: Secondary | ICD-10-CM

## 2011-08-29 DIAGNOSIS — J309 Allergic rhinitis, unspecified: Secondary | ICD-10-CM

## 2011-09-05 ENCOUNTER — Other Ambulatory Visit: Payer: Self-pay | Admitting: Cardiovascular Disease

## 2011-09-05 ENCOUNTER — Ambulatory Visit (INDEPENDENT_AMBULATORY_CARE_PROVIDER_SITE_OTHER): Payer: Medicare Other

## 2011-09-05 ENCOUNTER — Other Ambulatory Visit: Payer: Self-pay | Admitting: Internal Medicine

## 2011-09-05 DIAGNOSIS — J309 Allergic rhinitis, unspecified: Secondary | ICD-10-CM

## 2011-09-06 ENCOUNTER — Other Ambulatory Visit: Payer: Self-pay | Admitting: *Deleted

## 2011-09-06 DIAGNOSIS — J189 Pneumonia, unspecified organism: Secondary | ICD-10-CM

## 2011-09-06 HISTORY — DX: Pneumonia, unspecified organism: J18.9

## 2011-09-06 MED ORDER — FUROSEMIDE 40 MG PO TABS: 20.0000 mg | ORAL_TABLET | Freq: Two times a day (BID) | ORAL | Status: DC

## 2011-09-06 MED ORDER — DILTIAZEM HCL ER COATED BEADS 240 MG PO CP24: 240.0000 mg | ORAL_CAPSULE | Freq: Every day | ORAL | Status: DC

## 2011-09-06 MED ORDER — DIGOXIN 125 MCG PO TABS: 125.0000 ug | ORAL_TABLET | Freq: Every day | ORAL | Status: DC

## 2011-09-06 MED ORDER — DILTIAZEM HCL ER COATED BEADS 240 MG PO TB24: 240.0000 mg | ORAL_TABLET | Freq: Every day | ORAL | Status: DC

## 2011-09-06 NOTE — Telephone Encounter (Signed)
Fax Received. Refill Completed. [name] (M.A)  

## 2011-09-12 ENCOUNTER — Ambulatory Visit (INDEPENDENT_AMBULATORY_CARE_PROVIDER_SITE_OTHER): Payer: Medicare Other

## 2011-09-12 DIAGNOSIS — J309 Allergic rhinitis, unspecified: Secondary | ICD-10-CM

## 2011-09-15 ENCOUNTER — Inpatient Hospital Stay (HOSPITAL_COMMUNITY)
Admission: EM | Admit: 2011-09-15 | Discharge: 2011-09-21 | DRG: 193 | Disposition: A | Discharge status: Skilled Nursing Facility | Payer: Medicare Other | Attending: Internal Medicine | Admitting: Internal Medicine

## 2011-09-15 ENCOUNTER — Encounter (HOSPITAL_COMMUNITY): Payer: Self-pay

## 2011-09-15 ENCOUNTER — Emergency Department (HOSPITAL_COMMUNITY): Payer: Medicare Other

## 2011-09-15 DIAGNOSIS — Z9981 Dependence on supplemental oxygen: Secondary | ICD-10-CM

## 2011-09-15 DIAGNOSIS — I10 Essential (primary) hypertension: Secondary | ICD-10-CM | POA: Diagnosis present

## 2011-09-15 DIAGNOSIS — I350 Nonrheumatic aortic (valve) stenosis: Secondary | ICD-10-CM

## 2011-09-15 DIAGNOSIS — I4891 Unspecified atrial fibrillation: Secondary | ICD-10-CM | POA: Diagnosis present

## 2011-09-15 DIAGNOSIS — Z954 Presence of other heart-valve replacement: Secondary | ICD-10-CM

## 2011-09-15 DIAGNOSIS — J441 Chronic obstructive pulmonary disease with (acute) exacerbation: Secondary | ICD-10-CM

## 2011-09-15 DIAGNOSIS — Z950 Presence of cardiac pacemaker: Secondary | ICD-10-CM

## 2011-09-15 DIAGNOSIS — G4733 Obstructive sleep apnea (adult) (pediatric): Secondary | ICD-10-CM | POA: Diagnosis present

## 2011-09-15 DIAGNOSIS — T380X5A Adverse effect of glucocorticoids and synthetic analogues, initial encounter: Secondary | ICD-10-CM | POA: Diagnosis present

## 2011-09-15 DIAGNOSIS — J189 Pneumonia, unspecified organism: Principal | ICD-10-CM | POA: Diagnosis present

## 2011-09-15 DIAGNOSIS — J309 Allergic rhinitis, unspecified: Secondary | ICD-10-CM | POA: Diagnosis present

## 2011-09-15 DIAGNOSIS — R042 Hemoptysis: Secondary | ICD-10-CM

## 2011-09-15 DIAGNOSIS — M129 Arthropathy, unspecified: Secondary | ICD-10-CM | POA: Diagnosis present

## 2011-09-15 DIAGNOSIS — I509 Heart failure, unspecified: Secondary | ICD-10-CM | POA: Diagnosis present

## 2011-09-15 DIAGNOSIS — Z79899 Other long term (current) drug therapy: Secondary | ICD-10-CM

## 2011-09-15 DIAGNOSIS — Z7901 Long term (current) use of anticoagulants: Secondary | ICD-10-CM

## 2011-09-15 DIAGNOSIS — Z9861 Coronary angioplasty status: Secondary | ICD-10-CM

## 2011-09-15 DIAGNOSIS — Z87891 Personal history of nicotine dependence: Secondary | ICD-10-CM

## 2011-09-15 DIAGNOSIS — E871 Hypo-osmolality and hyponatremia: Secondary | ICD-10-CM | POA: Diagnosis not present

## 2011-09-15 DIAGNOSIS — D72829 Elevated white blood cell count, unspecified: Secondary | ICD-10-CM | POA: Diagnosis not present

## 2011-09-15 DIAGNOSIS — E876 Hypokalemia: Secondary | ICD-10-CM | POA: Diagnosis not present

## 2011-09-15 DIAGNOSIS — M810 Age-related osteoporosis without current pathological fracture: Secondary | ICD-10-CM | POA: Diagnosis present

## 2011-09-15 DIAGNOSIS — I517 Cardiomegaly: Secondary | ICD-10-CM | POA: Diagnosis present

## 2011-09-15 DIAGNOSIS — K5730 Diverticulosis of large intestine without perforation or abscess without bleeding: Secondary | ICD-10-CM | POA: Diagnosis present

## 2011-09-15 DIAGNOSIS — Z888 Allergy status to other drugs, medicaments and biological substances status: Secondary | ICD-10-CM

## 2011-09-15 DIAGNOSIS — K589 Irritable bowel syndrome without diarrhea: Secondary | ICD-10-CM | POA: Diagnosis present

## 2011-09-15 DIAGNOSIS — J9620 Acute and chronic respiratory failure, unspecified whether with hypoxia or hypercapnia: Secondary | ICD-10-CM | POA: Diagnosis present

## 2011-09-15 DIAGNOSIS — I5031 Acute diastolic (congestive) heart failure: Secondary | ICD-10-CM | POA: Diagnosis present

## 2011-09-15 DIAGNOSIS — I2510 Atherosclerotic heart disease of native coronary artery without angina pectoris: Secondary | ICD-10-CM | POA: Diagnosis present

## 2011-09-15 DIAGNOSIS — K219 Gastro-esophageal reflux disease without esophagitis: Secondary | ICD-10-CM | POA: Diagnosis present

## 2011-09-15 DIAGNOSIS — Z882 Allergy status to sulfonamides status: Secondary | ICD-10-CM

## 2011-09-15 HISTORY — DX: Atherosclerotic heart disease of native coronary artery without angina pectoris: I25.10

## 2011-09-15 HISTORY — DX: Essential (primary) hypertension: I10

## 2011-09-15 HISTORY — DX: Chronic obstructive pulmonary disease, unspecified: J44.9

## 2011-09-15 HISTORY — DX: Pneumonia, unspecified organism: J18.9

## 2011-09-15 HISTORY — DX: Anemia, unspecified: D64.9

## 2011-09-15 HISTORY — DX: Unspecified osteoarthritis, unspecified site: M19.90

## 2011-09-15 LAB — DIFFERENTIAL
Basophils Absolute: 0 10*3/uL (ref 0.0–0.1)
Eosinophils Relative: 0 % (ref 0–5)
Lymphocytes Relative: 5 % — ABNORMAL LOW (ref 12–46)
Monocytes Absolute: 1.8 10*3/uL — ABNORMAL HIGH (ref 0.1–1.0)
Monocytes Relative: 8 % (ref 3–12)
Neutro Abs: 20 10*3/uL — ABNORMAL HIGH (ref 1.7–7.7)

## 2011-09-15 LAB — COMPREHENSIVE METABOLIC PANEL
AST: 19 U/L (ref 0–37)
BUN: 17 mg/dL (ref 6–23)
CO2: 27 mEq/L (ref 19–32)
Calcium: 9.2 mg/dL (ref 8.4–10.5)
Chloride: 98 mEq/L (ref 96–112)
Creatinine, Ser: 0.69 mg/dL (ref 0.50–1.10)
GFR calc Af Amer: 89 mL/min — ABNORMAL LOW (ref >90)
GFR calc non Af Amer: 77 mL/min — ABNORMAL LOW (ref >90)
Glucose, Bld: 151 mg/dL — ABNORMAL HIGH (ref 70–99)
Total Bilirubin: 0.5 mg/dL (ref 0.3–1.2)

## 2011-09-15 LAB — GLUCOSE, CAPILLARY
Glucose-Capillary: 144 mg/dL — ABNORMAL HIGH (ref 70–99)
Glucose-Capillary: 115 mg/dL — ABNORMAL HIGH (ref 70–99)

## 2011-09-15 LAB — CARDIAC PANEL(CRET KIN+CKTOT+MB+TROPI)
CK, MB: 2.3 ng/mL (ref 0.3–4.0)
Total CK: 43 U/L (ref 7–177)
Troponin I: <0.3 ng/mL (ref <0.30)

## 2011-09-15 LAB — CBC
Hemoglobin: 12.5 g/dL (ref 12.0–15.0)
MCH: 27.4 pg (ref 26.0–34.0)
MCV: 83.8 fL (ref 78.0–100.0)
Platelets: 151 10*3/uL (ref 150–400)
RBC: 4.56 MIL/uL (ref 3.87–5.11)
WBC: 23 10*3/uL — ABNORMAL HIGH (ref 4.0–10.5)

## 2011-09-15 LAB — PROTIME-INR: Prothrombin Time: 24.8 seconds — ABNORMAL HIGH (ref 11.6–15.2)

## 2011-09-15 MED ORDER — SOTALOL HCL 80 MG PO TABS
80.0000 mg | ORAL_TABLET | Freq: Two times a day (BID) | ORAL | Status: DC
  Administered 2011-09-15 – 2011-09-21 (×12): 80 mg via ORAL
  Filled 2011-09-15 (×14): qty 1

## 2011-09-15 MED ORDER — PREDNISONE 10 MG PO TABS
10.0000 mg | ORAL_TABLET | Freq: Every day | ORAL | Status: DC
  Administered 2011-09-16 – 2011-09-17 (×2): 10 mg via ORAL
  Filled 2011-09-15 (×3): qty 1

## 2011-09-15 MED ORDER — TIOTROPIUM BROMIDE MONOHYDRATE 18 MCG IN CAPS
18.0000 ug | ORAL_CAPSULE | Freq: Every day | RESPIRATORY_TRACT | Status: DC
  Administered 2011-09-16 – 2011-09-21 (×6): 18 ug via RESPIRATORY_TRACT
  Filled 2011-09-15 (×2): qty 5

## 2011-09-15 MED ORDER — LEVALBUTEROL HCL 0.63 MG/3ML IN NEBU
0.6300 mg | INHALATION_SOLUTION | Freq: Three times a day (TID) | RESPIRATORY_TRACT | Status: DC
  Administered 2011-09-15 – 2011-09-17 (×5): 0.63 mg via RESPIRATORY_TRACT
  Filled 2011-09-15 (×8): qty 3

## 2011-09-15 MED ORDER — ALBUTEROL SULFATE (5 MG/ML) 0.5% IN NEBU
2.5000 mg | INHALATION_SOLUTION | Freq: Four times a day (QID) | RESPIRATORY_TRACT | Status: DC
  Filled 2011-09-15: qty 0.5

## 2011-09-15 MED ORDER — DEXTROSE 5 % IV SOLN
500.0000 mg | Freq: Once | INTRAVENOUS | Status: AC
  Administered 2011-09-15: 500 mg via INTRAVENOUS
  Filled 2011-09-15: qty 500

## 2011-09-15 MED ORDER — MONTELUKAST SODIUM 5 MG PO CHEW
5.0000 mg | CHEWABLE_TABLET | Freq: Every day | ORAL | Status: DC
  Administered 2011-09-15 – 2011-09-20 (×6): 5 mg via ORAL
  Filled 2011-09-15 (×7): qty 1

## 2011-09-15 MED ORDER — SERTRALINE HCL 50 MG PO TABS
50.0000 mg | ORAL_TABLET | Freq: Every day | ORAL | Status: DC
  Administered 2011-09-15 – 2011-09-21 (×7): 50 mg via ORAL
  Filled 2011-09-15 (×7): qty 1

## 2011-09-15 MED ORDER — IPRATROPIUM BROMIDE 0.02 % IN SOLN
0.5000 mg | Freq: Once | RESPIRATORY_TRACT | Status: AC
  Administered 2011-09-15: 0.5 mg via RESPIRATORY_TRACT
  Filled 2011-09-15: qty 2.5

## 2011-09-15 MED ORDER — ACETAMINOPHEN 650 MG RE SUPP: 650.0000 mg | Freq: Four times a day (QID) | RECTAL | Status: DC | PRN

## 2011-09-15 MED ORDER — DILTIAZEM HCL ER COATED BEADS 240 MG PO CP24
240.0000 mg | ORAL_CAPSULE | Freq: Every day | ORAL | Status: DC
  Administered 2011-09-15 – 2011-09-21 (×7): 240 mg via ORAL
  Filled 2011-09-15 (×7): qty 1

## 2011-09-15 MED ORDER — WARFARIN SODIUM 1 MG PO TABS
1.0000 mg | ORAL_TABLET | Freq: Once | ORAL | Status: AC
  Administered 2011-09-15: 1 mg via ORAL
  Filled 2011-09-15: qty 1

## 2011-09-15 MED ORDER — FUROSEMIDE 20 MG PO TABS
20.0000 mg | ORAL_TABLET | Freq: Two times a day (BID) | ORAL | Status: DC
  Administered 2011-09-15: 20 mg via ORAL
  Filled 2011-09-15 (×3): qty 1

## 2011-09-15 MED ORDER — EZETIMIBE 10 MG PO TABS
10.0000 mg | ORAL_TABLET | Freq: Every day | ORAL | Status: DC
  Administered 2011-09-15 – 2011-09-21 (×7): 10 mg via ORAL
  Filled 2011-09-15 (×8): qty 1

## 2011-09-15 MED ORDER — WARFARIN 0.5 MG HALF TABLET: 0.5000 mg | ORAL_TABLET | Freq: Every day | ORAL | Status: DC

## 2011-09-15 MED ORDER — DIGOXIN 125 MCG PO TABS
125.0000 ug | ORAL_TABLET | Freq: Every day | ORAL | Status: DC
  Administered 2011-09-15 – 2011-09-21 (×7): 125 ug via ORAL
  Filled 2011-09-15 (×7): qty 1

## 2011-09-15 MED ORDER — POTASSIUM CHLORIDE CRYS ER 20 MEQ PO TBCR
20.0000 meq | EXTENDED_RELEASE_TABLET | Freq: Two times a day (BID) | ORAL | Status: DC
  Administered 2011-09-15 – 2011-09-21 (×12): 20 meq via ORAL
  Filled 2011-09-15 (×14): qty 1

## 2011-09-15 MED ORDER — DOCUSATE SODIUM 100 MG PO CAPS
100.0000 mg | ORAL_CAPSULE | Freq: Every day | ORAL | Status: DC | PRN
  Administered 2011-09-18: 100 mg via ORAL
  Filled 2011-09-15 (×2): qty 1

## 2011-09-15 MED ORDER — POTASSIUM CHLORIDE 20 MEQ PO PACK: 20.0000 meq | PACK | Freq: Two times a day (BID) | ORAL | Status: DC

## 2011-09-15 MED ORDER — MONTELUKAST SODIUM 10 MG PO TABS
5.0000 mg | ORAL_TABLET | Freq: Every day | ORAL | Status: DC
  Filled 2011-09-15: qty 0.5

## 2011-09-15 MED ORDER — SODIUM CHLORIDE 0.9 % IV SOLN
3.0000 g | Freq: Three times a day (TID) | INTRAVENOUS | Status: DC
  Administered 2011-09-15 – 2011-09-17 (×6): 3 g via INTRAVENOUS
  Filled 2011-09-15 (×8): qty 3

## 2011-09-15 MED ORDER — ACETAMINOPHEN 325 MG PO TABS
650.0000 mg | ORAL_TABLET | Freq: Four times a day (QID) | ORAL | Status: DC | PRN
  Administered 2011-09-18: 650 mg via ORAL
  Filled 2011-09-15: qty 2

## 2011-09-15 MED ORDER — ALBUTEROL SULFATE (5 MG/ML) 0.5% IN NEBU
5.0000 mg | INHALATION_SOLUTION | Freq: Once | RESPIRATORY_TRACT | Status: AC
  Administered 2011-09-15: 5 mg via RESPIRATORY_TRACT
  Filled 2011-09-15: qty 1

## 2011-09-15 MED ORDER — PANTOPRAZOLE SODIUM 40 MG PO TBEC
40.0000 mg | DELAYED_RELEASE_TABLET | Freq: Every day | ORAL | Status: DC
  Administered 2011-09-15 – 2011-09-21 (×7): 40 mg via ORAL
  Filled 2011-09-15 (×6): qty 1

## 2011-09-15 MED ORDER — ONDANSETRON HCL 4 MG/2ML IJ SOLN: 4.0000 mg | Freq: Three times a day (TID) | INTRAMUSCULAR | Status: DC | PRN

## 2011-09-15 NOTE — ED Notes (Signed)
Report given to [facility], [place] on 4700. Patient transported by Mellody Dance, NT

## 2011-09-15 NOTE — ED Notes (Signed)
MD at bedside. 

## 2011-09-15 NOTE — ED Notes (Signed)
Attempted to call report to 36, RN unavailable and will call back

## 2011-09-15 NOTE — ED Notes (Signed)
Patient presents from home with shortness of breath since last night with worsening symptoms today.  Patient is speaking in short, complete sentences with mild labored breathing. Skin warm, dry and intact, patient is alert and orientedx3, resting quietly.  Lungs sound dimished to bases but are clear to apices.

## 2011-09-15 NOTE — ED Notes (Signed)
Family at bedside. 

## 2011-09-15 NOTE — ED Notes (Signed)
Pt placed cardiac monitor, cont. Pulse ox. 

## 2011-09-15 NOTE — Progress Notes (Signed)
ANTICOAGULATION CONSULT NOTE - Initial Consult  Pharmacy Consult for coumadin Indication: atrial fibrillation  Allergies  Allergen Reactions  . Atorvastatin     Forgetfulness   . Cephalexin     Unsure as to reaction. "I can take Penicillin"  . Codeine Nausea And Vomiting  . Crestor (Rosuvastatin Calcium)     Forgetfulness    . Estradiol     Unknown reaction  . Hydrocortisone Hives  . Mevacor Other (See Comments)    forgetfullness,  . Moxifloxacin     Unknown reaction  . Sulfonamide Derivatives     itching    Patient Measurements: Height: 5' 2.5" (158.8 cm) Weight: 129 lb 6.6 oz (58.7 kg) IBW/kg (Calculated) : 51.25    Vital Signs: Temp: 99.4 F (37.4 C) (11/10 1555) Temp src: Oral (11/10 1341) BP: 116/63 mmHg (11/10 1555) Pulse Rate: 62  (11/10 1555)  Labs:  Basename 09/15/11 1126  HGB 12.5  HCT 38.2  PLT 151  APTT 38*  LABPROT 24.8*  INR 2.20*  HEPARINUNFRC --  CREATININE 0.69  CKTOTAL 43  CKMB 2.3  TROPONINI <0.30   Estimated Creatinine Clearance: 41.6 ml/min (by C-G formula based on Cr of 0.69).  Medical History: Past Medical History  Diagnosis Date  . Asthmatic bronchitis   . Allergic rhinitis   . Atrial fibrillation   . History of aortic valve replacement Dr. Tyrone Sage 2006    Has severe residual gradient and will be managed conservatively  . Sleep apnea   . Chronic obstructive asthma   . Osteoporosis   . Pruritic disorder   . Diverticulosis   . Hemorrhoids   . GERD (gastroesophageal reflux disease)   . IBS (irritable bowel syndrome)   . Pacemaker 2010  . Oxygen dependent   . Atrial flutter     on Betapace  . High risk medication use     on Betapace & Coumadin  . Chronic anticoagulation   . COPD (chronic obstructive pulmonary disease)   . Pneumonia   . Anemia   . Arthritis   . Hypertension   . Coronary artery disease     Medications:  Prescriptions prior to admission  Medication Sig Dispense Refill  . digoxin (LANOXIN)  0.125 MG tablet Take 1 tablet (125 mcg total) by mouth daily.  30 tablet  6  . diltiazem (CARDIZEM CD) 240 MG 24 hr capsule Take 1 capsule (240 mg total) by mouth daily.  30 capsule  6  . diphenhydrAMINE (CHILDRENS ALLERGY) 12.5 MG/5ML liquid Take 12.5 mg by mouth at bedtime.       [name] Calcium (STOOL SOFTENER PO) Take 100 mg by mouth daily.       . DULERA 200-5 MCG/ACT AERO USE 2 PUFFS TWO TIMES A DAY AS NEEDED  1 Inhaler  6  . ezetimibe (ZETIA) 10 MG tablet Take 1 tablet (10 mg total) by mouth daily.  30 tablet  5  . furosemide (LASIX) 40 MG tablet Take 20 mg by mouth 2 (two) times daily.        . furosemide (LASIX) 40 MG tablet Take 20 mg by mouth 2 (two) times daily.        . Guaifenesin (MUCINEX MAXIMUM STRENGTH) 1200 MG TB12 Take 1,200 mg by mouth 2 (two) times daily at 10 AM and 5 PM.        . IRON PO Take 1 tablet by mouth daily.        Marland Kitchen levalbuterol (XOPENEX) 0.63 MG/3ML nebulizer solution Take 1  ampule by nebulization 3 (three) times daily.        . methylPREDNISolone (MEDROL) 4 MG tablet Take 4 mg by mouth daily.        . montelukast (SINGULAIR) 10 MG tablet TAKE 1 TABLET DAILY FOR ASTHMA  30 tablet  2  . NEXIUM 40 MG capsule TAKE 1 TABLET BY MOUTH ONCE A DAY  90 capsule  0  . potassium chloride SA (K-DUR,KLOR-CON) 20 MEQ tablet Take 20 mEq by mouth 2 (two) times daily.        . sertraline (ZOLOFT) 50 MG tablet Take 50 mg by mouth daily.       . sotalol (BETAPACE) 80 MG tablet Take 80 mg by mouth 2 (two) times daily.        Marland Kitchen SPIRIVA HANDIHALER 18 MCG inhalation capsule INHALE CONTENTS OF 1 CAPSULE ONCE A DAY  30 each  5  . warfarin (COUMADIN) 1 MG tablet Take 0.5-1 mg by mouth daily. Take 0.5 tablet on Tuesday & Thursday Take 1 tablet on all other days      . Alum & Mag Hydroxide-Simeth (MAGIC MOUTHWASH) SOLN Take 5 mLs by mouth 4 (four) times daily as needed. For mouth irritations       . levalbuterol (XOPENEX HFA) 45 MCG/ACT inhaler Inhale 2 puffs into the lungs every 6  (six) hours as needed. For shortness of breath      . levalbuterol (XOPENEX) 1.25 MG/3ML nebulizer solution Take 1 ampule by nebulization 3 (three) times daily. DX:  493.20       . Omega-3 Fatty Acids (FISH OIL PO) Take 1 capsule by mouth 2 (two) times daily.        . sodium chloride ([employer] SALINE NASAL SPRAY) 0.65 % nasal spray Place 1 spray into the nose as needed. For nose bleeds        Assessment: Pt admitted to the hospital for treatment of pneumonia. Pt was on coumadin PTA for afib. INR is 2.2 today which is at goal. Will give normal home dose today   Goal of Therapy:  INR 2-3   Plan:  Coumadin 1mg  PO x 1 tonight Daily PT/INR  Ciji Boston, Drake Leach 09/15/2011,4:58 PM

## 2011-09-15 NOTE — ED Notes (Signed)
Patient is resting comfortably. 

## 2011-09-15 NOTE — H&P (Addendum)
PCP:   [name], MD, MD   Chief Complaint:  Shortness of breath  HPI: 77 year old female who came to the hospital after she had episode of shortness of breath last night. It is is that she was sleeping in the bed when the pain in her left hip woke up and she found that she was extremely short of breath. She also had a coughing spell and and was coughing up green-colored phlegm. As per patient she also noticed some blood in the phlegm. She also describes some chest pain though patient feels much better at this time. Patient has a history of COPD she is a smoker smoked for 50 years. Quit smoking in 1995. Chest x-ray done in the ER showed pneumonia.  Allergies:   Allergies  Allergen Reactions  . Atorvastatin     Forgetfulness   . Cephalexin     Unsure as to reaction. "I can take Penicillin"  . Codeine Nausea And Vomiting  . Crestor (Rosuvastatin Calcium)     Forgetfulness    . Estradiol     Unknown reaction  . Hydrocortisone Hives  . Mevacor Other (See Comments)    forgetfullness,  . Moxifloxacin     Unknown reaction  . Sulfonamide Derivatives     itching      Past Medical History  Diagnosis Date  . Asthmatic bronchitis   . Allergic rhinitis   . Atrial fibrillation   . History of aortic valve replacement Dr. Tyrone Sage 2006    Has severe residual gradient and will be managed conservatively  . Sleep apnea   . Chronic obstructive asthma   . Osteoporosis   . Pruritic disorder   . Diverticulosis   . Hemorrhoids   . GERD (gastroesophageal reflux disease)   . IBS (irritable bowel syndrome)   . Pacemaker 2010  . Oxygen dependent   . Atrial flutter     on Betapace  . High risk medication use     on Betapace & Coumadin  . Chronic anticoagulation   . COPD (chronic obstructive pulmonary disease)   . Pneumonia   . Anemia   . Arthritis   . Hypertension   . Coronary artery disease     Past Surgical History  Procedure Date  . Back surgery 1994  . Tonsillectomy     . Appendectomy   . Ganglion cyst excision   . Vocal cord polyps 1975 and 1984  . Aortic valve replacement 2006  . Pacemaker insertion 2010  . Cardiac electrophysiology mapping and ablation   . Coronary artery bypass graft   . Insert / replace / remove pacemaker   . Eye surgery   . Cardiac catheterization   . Coronary angioplasty   . Cardiac valve replacement     Prior to Admission medications   Medication Sig Start Date End Date Taking? Authorizing Provider  digoxin (LANOXIN) 0.125 MG tablet Take 1 tablet (125 mcg total) by mouth daily. 09/06/11  Yes Elyn Aquas., MD  diltiazem (CARDIZEM CD) 240 MG 24 hr capsule Take 1 capsule (240 mg total) by mouth daily. 09/06/11  Yes Elyn Aquas., MD  diphenhydrAMINE (CHILDRENS ALLERGY) 12.5 MG/5ML liquid Take 12.5 mg by mouth at bedtime.    Yes Historical Provider, MD  Docusate Calcium (STOOL SOFTENER PO) Take 100 mg by mouth daily.    Yes Historical Provider, MD  DULERA 200-5 MCG/ACT AERO USE 2 PUFFS TWO TIMES A DAY AS NEEDED 06/05/11  Yes Waymon Budge, MD  ezetimibe (ZETIA) 10  MG tablet Take 1 tablet (10 mg total) by mouth daily. 05/25/11  Yes Elyn Aquas., MD  furosemide (LASIX) 40 MG tablet Take 20 mg by mouth 2 (two) times daily.     Yes Historical Provider, MD  furosemide (LASIX) 40 MG tablet Take 20 mg by mouth 2 (two) times daily.     Yes Historical Provider, MD  Guaifenesin (MUCINEX MAXIMUM STRENGTH) 1200 MG TB12 Take 1,200 mg by mouth 2 (two) times daily at 10 AM and 5 PM.     Yes Historical Provider, MD  IRON PO Take 1 tablet by mouth daily.     Yes Historical Provider, MD  levalbuterol Pauline Aus) 0.63 MG/3ML nebulizer solution Take 1 ampule by nebulization 3 (three) times daily.     Yes Historical Provider, MD  methylPREDNISolone (MEDROL) 4 MG tablet Take 4 mg by mouth daily.     Yes Historical Provider, MD  montelukast (SINGULAIR) 10 MG tablet TAKE 1 TABLET DAILY FOR ASTHMA 09/05/11  Yes Waymon Budge, MD  NEXIUM 40 MG  capsule TAKE 1 TABLET BY MOUTH ONCE A DAY 07/09/11  Yes Pecola Lawless, MD  potassium chloride SA (K-DUR,KLOR-CON) 20 MEQ tablet Take 20 mEq by mouth 2 (two) times daily.     Yes Historical Provider, MD  sertraline (ZOLOFT) 50 MG tablet Take 50 mg by mouth daily.    Yes Yancey Flemings, MD  sotalol (BETAPACE) 80 MG tablet Take 80 mg by mouth 2 (two) times daily.     Yes Historical Provider, MD  SPIRIVA HANDIHALER 18 MCG inhalation capsule INHALE CONTENTS OF 1 CAPSULE ONCE A DAY 07/06/11 07/09/12 Yes Clinton D Young, MD  warfarin (COUMADIN) 1 MG tablet Take 0.5-1 mg by mouth daily. Take 0.5 tablet on Tuesday & Thursday Take 1 tablet on all other days   Yes Historical Provider, MD  Alum & Mag Hydroxide-Simeth (MAGIC MOUTHWASH) SOLN Take 5 mLs by mouth 4 (four) times daily as needed. For mouth irritations     Historical Provider, MD  levalbuterol (XOPENEX HFA) 45 MCG/ACT inhaler Inhale 2 puffs into the lungs every 6 (six) hours as needed. For shortness of breath    Historical Provider, MD  levalbuterol (XOPENEX) 1.25 MG/3ML nebulizer solution Take 1 ampule by nebulization 3 (three) times daily. DX:  493.20  05/18/11 08/16/11  Waymon Budge, MD  Omega-3 Fatty Acids (FISH OIL PO) Take 1 capsule by mouth 2 (two) times daily.      Historical Provider, MD  sodium chloride ([employer] SALINE NASAL SPRAY) 0.65 % nasal spray Place 1 spray into the nose as needed. For nose bleeds    Historical Provider, MD    Social History:  reports that she quit smoking about 17 years ago. Her smoking use included Cigarettes. She has a 50 pack-year smoking history. She has never used smokeless tobacco. She reports that she does not drink alcohol or use illicit drugs.  Family History  Problem Relation Age of Onset  . Kidney failure Father   . Colon cancer Paternal Aunt   . Heart disease Maternal Grandfather   . Heart attack Paternal Grandfather   . Breast cancer Daughter   . Stroke Maternal Grandmother   . Heart disease Mother      Review of Systems:  Constitutional: Admits to having  Fever. HEENT: Admits having  congestion,  rhinorrhea,  Respiratory: hpi Cardiovascular: hpi Gastrointestinal: Denies nausea, vomiting, abdominal pain, diarrhea, constipation, blood in stool and abdominal distention.  Genitourinary: Denies dysuria, urgency, frequency, hematuria,  flank pain and difficulty urinating.  Neurological: Denies dizziness, seizures, syncope, weakness, light-headedness, numbness and headaches.   Physical Exam: Blood pressure 106/83, pulse 62, temperature 98.1 F (36.7 C), temperature source Oral, resp. rate 18, SpO2 96.00%. Constitutional: Vital signs reviewed.  Patient is a well-developed and well-nourished  in no acute distress and cooperative with exam. Alert and oriented x3.  Head: Normocephalic and atraumatic Mouth: no erythema or exudates, MM dry Eyes: PERRL, EOMI, conjunctivae normal, No scleral icterus.  Neck: Supple, Trachea midline normal ROM, No JVD,  Cardiovascular: RRR, S1 normal, S2 normal, no MRG, pulses symmetric and intact bilaterally Pulmonary/Chest: Mild Rhonchi bilaterally. Abdominal: Soft. Non-tender, non-distended, bowel sounds are normal, no masses, organomegaly, or guarding present.  Neurological: A&O x3, Strenght is normal and symmetric bilaterally, cranial nerve II-XII are grossly intact,. Psychiatric: Normal mood and affect. speech and behavior is normal. Judgment and thought content normal. Cognition and memory are normal.    Labs on Admission:  Results for orders placed during the hospital encounter of 09/15/11 (from the past 48 hour(s))  CBC     Status: Abnormal   Collection Time   09/15/11 11:26 AM      Component Value Range Comment   WBC 23.0 (*) 4.0 - 10.5 (K/uL)    RBC 4.56  3.87 - 5.11 (MIL/uL)    Hemoglobin 12.5  12.0 - 15.0 (g/dL)    HCT 16.1  09.6 - 04.5 (%)    MCV 83.8  78.0 - 100.0 (fL)    MCH 27.4  26.0 - 34.0 (pg)    MCHC 32.7  30.0 - 36.0 (g/dL)    RDW  40.9 (*) 81.1 - 15.5 (%)    Platelets 151  150 - 400 (K/uL)   DIFFERENTIAL     Status: Abnormal   Collection Time   09/15/11 11:26 AM      Component Value Range Comment   Neutrophils Relative 87 (*) 43 - 77 (%)    Neutro Abs 20.0 (*) 1.7 - 7.7 (K/uL)    Lymphocytes Relative 5 (*) 12 - 46 (%)    Lymphs Abs 1.1  0.7 - 4.0 (K/uL)    Monocytes Relative 8  3 - 12 (%)    Monocytes Absolute 1.8 (*) 0.1 - 1.0 (K/uL)    Eosinophils Relative 0  0 - 5 (%)    Eosinophils Absolute 0.0  0.0 - 0.7 (K/uL)    Basophils Relative 0  0 - 1 (%)    Basophils Absolute 0.0  0.0 - 0.1 (K/uL)   COMPREHENSIVE METABOLIC PANEL     Status: Abnormal   Collection Time   09/15/11 11:26 AM      Component Value Range Comment   Sodium 134 (*) 135 - 145 (mEq/L)    Potassium 3.9  3.5 - 5.1 (mEq/L)    Chloride 98  96 - 112 (mEq/L)    CO2 27  19 - 32 (mEq/L)    Glucose, Bld 151 (*) 70 - 99 (mg/dL)    BUN 17  6 - 23 (mg/dL)    Creatinine, Ser 9.14  0.50 - 1.10 (mg/dL)    Calcium 9.2  8.4 - 10.5 (mg/dL)    Total Protein 6.4  6.0 - 8.3 (g/dL)    Albumin 3.5  3.5 - 5.2 (g/dL)    AST 19  0 - 37 (U/L)    ALT 18  0 - 35 (U/L)    Alkaline Phosphatase 41  39 - 117 (U/L)    Total Bilirubin  0.5  0.3 - 1.2 (mg/dL)    GFR calc non Af Amer 77 (*) >90 (mL/min)    GFR calc Af Amer 89 (*) >90 (mL/min)   CARDIAC PANEL(CRET KIN+CKTOT+MB+TROPI)     Status: Normal   Collection Time   09/15/11 11:26 AM      Component Value Range Comment   Total CK 43  7 - 177 (U/L)    CK, MB 2.3  0.3 - 4.0 (ng/mL)    Troponin I <0.30  <0.30 (ng/mL)    Relative Index RELATIVE INDEX IS INVALID  0.0 - 2.5    PROTIME-INR     Status: Abnormal   Collection Time   09/15/11 11:26 AM      Component Value Range Comment   Prothrombin Time 24.8 (*) 11.6 - 15.2 (seconds)    INR 2.20 (*) 0.00 - 1.49    APTT     Status: Abnormal   Collection Time   09/15/11 11:26 AM      Component Value Range Comment   aPTT 38 (*) 24 - 37 (seconds)   PRO B NATRIURETIC  PEPTIDE     Status: Abnormal   Collection Time   09/15/11 11:30 AM      Component Value Range Comment   BNP, POC 978.7 (*) 0 - 450 (pg/mL)   GLUCOSE, CAPILLARY     Status: Abnormal   Collection Time   09/15/11 11:55 AM      Component Value Range Comment   Glucose-Capillary 144 (*) 70 - 99 (mg/dL)     Radiological Exams on Admission:  CXR: Findings: Lungs are emphysematous. Basilar airspace disease is  best seen on the lateral view, likely in the left lower lobe.  Lungs otherwise appear clear. Cardiomegaly.  IMPRESSION:  1. Emphysema with basilar airspace disease, likely on the left,  worrisome for pneumonia.  2. Cardiomegaly without edema.   Assessment/Plan  Pneumonia Patient is found to have pneumonia on chest x-ray and also has elevated white count of 23,000 though it can be from the chronic prednisone use but she has some left shift so she will be started on by mouth Levaquin. Patient has avelox   listed as one of the medications with allergies but patient says that she usually gets stomach cramps with Avelox. She says that she got one antibiotic in 1995 which caused her hives patient cannot remember the name of antibiotic. The avelox  was not present at that time. I discussed with the patient and her daughter and they both agree to try her on Levaquin as she is allergic to cephalexin so will not give her ceftriaxone.   Atrial fibrillation Patient has history of A. Fib, she will continued on digoxin and sotalol, diltiazem, She also is on Coumadin which will be continued.    COPD with exacerbation Will continue duoneb  Nebulizer.Prednisone taper and Levaquin.  CHF : Patient has elevated BNP, and will be continued on po lasix. Will also check 2d echocardiogram.   Time Spent on Admission: 75 min  Raylon Lamson S 09/15/2011, 4:09 PM  Pharmacy called that patient is on levaquin and sotalol, which can cause QT prolongation. Checked with patient, she says she can take penicillin  without any problem. Will d/c levaquin and start Unasyn.

## 2011-09-15 NOTE — ED Provider Notes (Addendum)
History     CSN: [id number] Arrival date & time: 09/15/2011 10:47 AM   First MD Initiated Contact with Patient 09/15/11 1117      Chief Complaint  Patient presents with  . Shortness of Breath    (Consider location/radiation/quality/duration/timing/severity/associated sxs/prior treatment) HPI  This is a 77 year old pleasant lady with PMH of COPD with home O2., chronic hypoxia respiratory failure, unspecified hemoptysis,  atrial fibrillation, valvular heart disease and pacemaker who presents with worsening of shortness of breath over night. The history was provided by patient. Patient states that she started to have mild intermittent cough and SOB 2 days ago with worsening of symptoms x1 day,  accompanied with running nose. She states that she had worsening SOB and cough when lying down due to too much nasal drainage last night.  Patient had to sit up straight all night long because she felt better with sitting position, and she checked her oxygen saturation to be 82% while wearing 3.5 L home O2. Patient reports that she has moderate amount of greenish sputum with pinkish blood noted for one day. No bright red blood noted with her sputum.  Her temperature was 100F this am. Activities make above symptoms worse. No alleviating factors.  Patient also complained of moderate to intermittent epigastric discomfort vs dull pain last night, accompanied with nausea and indigestion.  No vomiting. No chest pressure, radiation or palpitation. No headache or sore throat. No melena, diarrhea or incontinence. No muscle weakness.  Denies sick contacts or recent travel. The patient lives by herself and had a flu shot this year.  Past Medical History  Diagnosis Date  . Asthmatic bronchitis   . Allergic rhinitis   . Atrial fibrillation   . History of aortic valve replacement Dr. Tyrone Sage 2006    Has severe residual gradient and will be managed conservatively  . Sleep apnea   . Chronic obstructive  asthma   . Osteoporosis   . Chronic fatigue   . Pruritic disorder   . Diverticulosis   . Hemorrhoids   . GERD (gastroesophageal reflux disease)   . IBS (irritable bowel syndrome)   . Pacemaker 2010  . Oxygen dependent   . Atrial flutter     on Betapace  . High risk medication use     on Betapace & Coumadin  . Chronic anticoagulation     Past Surgical History  Procedure Date  . Back surgery 1994  . Tonsillectomy   . Appendectomy   . Ganglion cyst excision   . Vocal cord polyps 1975 and 1984  . Aortic valve replacement 2006  . Pacemaker insertion 2010  . Cardiac electrophysiology mapping and ablation     Family History  Problem Relation Age of Onset  . Kidney failure Father   . Colon cancer Paternal Aunt   . Heart disease Maternal Grandfather   . Heart attack Paternal Grandfather   . Breast cancer Daughter   . Stroke Maternal Grandmother   . Heart disease Mother     History  Substance Use Topics  . Smoking status: Former Smoker -- 1.0 packs/day for 50 years    Types: Cigarettes    Quit date: 11/05/1993  . Smokeless tobacco: Never Used  . Alcohol Use: No    OB History    Grav Para Term Preterm Abortions TAB SAB Ect Mult Living                  Review of Systems     Allergies  Atorvastatin; Cephalexin;  Codeine; Crestor; Estradiol; Hydrocortisone; Mevacor; Moxifloxacin; and Sulfonamide derivatives  Home Medications   Current Outpatient Rx  Name Route Sig Dispense Refill  . DIGOXIN 0.125 MG PO TABS Oral Take 1 tablet (125 mcg total) by mouth daily. 30 tablet 6  . DILTIAZEM HCL COATED BEADS 240 MG PO CP24 Oral Take 1 capsule (240 mg total) by mouth daily. 30 capsule 6  . FUROSEMIDE 40 MG PO TABS Oral Take 0.5 tablets (20 mg total) by mouth 2 (two) times daily. 30 tablet 6  . METHYLPREDNISOLONE 4 MG PO TABS Oral Take 4 mg by mouth daily.      Marland Kitchen MONTELUKAST SODIUM 10 MG PO TABS  TAKE 1 TABLET DAILY FOR ASTHMA 30 tablet 2  . NEXIUM 40 MG PO CPDR  TAKE 1  TABLET BY MOUTH ONCE A DAY 90 capsule 0  . POTASSIUM CHLORIDE 20 MEQ PO PACK Oral Take 20 mEq by mouth 2 (two) times daily.      . SERTRALINE HCL 50 MG PO TABS       . SOTALOL HCL 80 MG PO TABS Oral Take 80 mg by mouth 2 (two) times daily.      . AEROCHAMBER Z-STAT PLUS CHAMBR MISC Does not apply by Does not apply route. Use with inhaler as directed     . SPIRIVA HANDIHALER 18 MCG IN CAPS  INHALE CONTENTS OF 1 CAPSULE ONCE A DAY 30 each 5  . WARFARIN SODIUM 1 MG PO TABS Oral Take 1 mg by mouth as directed.      Marland Kitchen MAGIC MOUTHWASH Oral Take 5 mLs by mouth 4 (four) times daily as needed. 1 tsp swish and swallow four times a day     . DIPHENHYDRAMINE HCL 12.5 MG/5ML PO LIQD Oral Take 12.5 mg by mouth as directed.      Marland Kitchen STOOL SOFTENER PO Oral Take by mouth daily.      . DULERA 200-5 MCG/ACT IN AERO  USE 2 PUFFS TWO TIMES A DAY AS NEEDED 1 Inhaler 6  . EZETIMIBE 10 MG PO TABS Oral Take 1 tablet (10 mg total) by mouth daily. 30 tablet 5  . GUAIFENESIN 1200 MG PO TB12 Oral Take 1,200 mg by mouth 2 (two) times daily at 10 AM and 5 PM.      . LEVALBUTEROL TARTRATE 45 MCG/ACT IN AERO Inhalation Inhale 2 puffs into the lungs every 6 (six) hours as needed.      Marland Kitchen LEVALBUTEROL HCL 1.25 MG/3ML IN NEBU Nebulization Take 1 ampule by nebulization 3 (three) times daily. DX:  493.20     . METHYLPREDNISOLONE 4 MG PO TABS  TAKE 1 TABLET EVERY DAY 30 tablet 2  . CALCIUM CITRATE + PO TABS Oral Take by mouth.      . MULTIVITAMINS PO Oral Take by mouth. 1 by mouth daily     . OXYGEN-HELIUM IN Inhalation Inhale into the lungs. 3.5 liters 24/7    . SALINE NASAL SPRAY 0.65 % [employer] SOLN Nasal 1 spray by Nasal route as needed.      Marland Kitchen VITAMIN B-12 100 MCG PO TABS Oral Take 100 mcg by mouth daily.        BP 123/34  Pulse 63  Temp(Src) 97.9 F (36.6 C) (Oral)  Resp 22  SpO2 98%  Physical Exam General: mild distress. alert, well-developed, and cooperative to examination.  Head: normocephalic and atraumatic.  Eyes:  vision grossly intact, pupils equal, pupils round, pupils reactive to light, no injection and anicteric.  Mouth: pharynx pink and moist, no erythema, and no exudates.  Neck: supple, full ROM, no thyromegaly, positive JVD, and no carotid bruits.  Lungs: B/L basilar lung sounds diminished with faint rales noted on post. left lower lob. Scattered wheezing noted. A Heart: normal rate, regular rhythm, no murmur, no gallop, and no rub.  Abdomen: Round abdomen . soft, non-tender, normal bowel sounds, no distention, no guarding, no rebound tenderness, no hepatomegaly, and no splenomegaly.  Msk: no joint swelling, no joint warmth, and no redness over joints.  Pulses: 2+ DP/PT pulses bilaterally Extremities: No cyanosis, clubbing, edema Neurologic: alert & oriented X3, cranial nerves II-XII intact, strength normal in all extremities, sensation intact to light touch, and gait normal.  Skin: turgor normal and no rashes.  Psych: Oriented X3, memory intact for recent and remote, normally interactive, good eye contact, not anxious appearing, and not depressed appearing.   ED Course  Procedures (including critical care time)  Date: 09/15/2011  Rate: 75  Rhythm: normal sinus rhythm  QRS Axis: normal  Intervals: normal  ST/T Wave abnormalities: nonspecific ST/T changes  Conduction Disutrbances:none  Narrative Interpretation:   Old EKG Reviewed: changes noted   11:58 AM The clinical manifestations is consistent with COPD exacerbation. Patient did have hypoxia overnight per her report. Her O2 saturation is at 96% on 4 L oxygen in the ER. Patient continues is very stable now. Will check CBC, CMET, CXR and give pt neb treatment.  Given her mild epigastric discomfort, indigestion and her cardiovascular risk factors,  will also order cardiac workup.          Dede Query, MD 09/15/11 1220  Dede Query, MD 09/15/11 4401

## 2011-09-15 NOTE — ED Notes (Signed)
Patient presents from home with increased shortness of breath since last night without help from albuterol treatments.  Patient reports left side pain to lower chest from coughing.

## 2011-09-15 NOTE — ED Provider Notes (Signed)
I saw and evaluated the patient, reviewed the resident's note and I agree with the findings and plan.   Nelia Shi, MD 09/15/11 [phone]

## 2011-09-16 DIAGNOSIS — I059 Rheumatic mitral valve disease, unspecified: Secondary | ICD-10-CM

## 2011-09-16 LAB — PROTIME-INR: INR: 2.39 — ABNORMAL HIGH (ref 0.00–1.49)

## 2011-09-16 LAB — CARDIAC PANEL(CRET KIN+CKTOT+MB+TROPI)
CK, MB: 3.6 ng/mL (ref 0.3–4.0)
CK, MB: 3.6 ng/mL (ref 0.3–4.0)
Troponin I: <0.3 ng/mL (ref <0.30)
Troponin I: <0.3 ng/mL (ref <0.30)
Troponin I: <0.3 ng/mL (ref <0.30)

## 2011-09-16 MED ORDER — WARFARIN SODIUM 1 MG PO TABS
1.0000 mg | ORAL_TABLET | Freq: Once | ORAL | Status: AC
  Administered 2011-09-16: 1 mg via ORAL
  Filled 2011-09-16: qty 1

## 2011-09-16 MED ORDER — FUROSEMIDE 10 MG/ML IJ SOLN
20.0000 mg | Freq: Two times a day (BID) | INTRAMUSCULAR | Status: DC
  Administered 2011-09-16 – 2011-09-17 (×3): 20 mg via INTRAVENOUS
  Filled 2011-09-16 (×2): qty 2

## 2011-09-16 MED ORDER — FUROSEMIDE 10 MG/ML IJ SOLN
INTRAMUSCULAR | Status: AC
  Filled 2011-09-16: qty 4

## 2011-09-16 NOTE — Progress Notes (Signed)
Subjective: Patient seen and examined, still having shortness of breath.  Objective: Vital signs in last 24 hours: Temp:  [97.6 F (36.4 C)-99.4 F (37.4 C)] 97.6 F (36.4 C) (11/11 0948) Pulse Rate:  [60-69] 69  (11/11 0948) Resp:  [16-20] 20  (11/11 0948) BP: (94-138)/(50-83) 138/63 mmHg (11/11 0948) SpO2:  [92 %-97 %] 93 % (11/11 0948) FiO2 (%):  [92 %-95 %] 95 % (11/10 1800) Weight:  [58.5 kg (128 lb 15.5 oz)-58.7 kg (129 lb 6.6 oz)] 128 lb 15.5 oz (58.5 kg) (11/11 0501) Weight change:  Last BM Date: 09/14/11  Intake/Output from previous day: 11/10 0701 - 11/11 0700 In: 660 [P.O.:660] Out: 401 [Urine:400; Stool:1] Total I/O In: 240 [P.O.:240] Out: 600 [Urine:600]   Physical Exam: General: Alert, awake, oriented x3, in no acute distress. HEENT: No bruits, no goiter. Heart: Regular rate and rhythm, without murmurs, rubs, gallops. Lungs: Bilateral rhonchi Abdomen: Soft, nontender, nondistended, positive bowel sounds. Extremities: No clubbing cyanosis or edema with positive pedal pulses. Neuro: Grossly intact, nonfocal.    Lab Results: Basic Metabolic Panel:  Basename 09/15/11 1126  NA 134*  K 3.9  CL 98  CO2 27  GLUCOSE 151*  BUN 17  CREATININE 0.69  CALCIUM 9.2  MG --  PHOS --   Liver Function Tests:  Basename 09/15/11 1126  AST 19  ALT 18  ALKPHOS 41  BILITOT 0.5  PROT 6.4  ALBUMIN 3.5   No results found for this basename: LIPASE:2,AMYLASE:2 in the last 72 hours No results found for this basename: AMMONIA:2 in the last 72 hours CBC:  Basename 09/15/11 1126  WBC 23.0*  NEUTROABS 20.0*  HGB 12.5  HCT 38.2  MCV 83.8  PLT 151   Cardiac Enzymes:  Basename 09/16/11 0814 09/15/11 2330 09/15/11 1126  CKTOTAL 135 129 43  CKMB 3.6 3.6 2.3  CKMBINDEX -- -- --  TROPONINI <0.30 <0.30 <0.30   BNP:  Basename 09/15/11 1130  POCBNP 978.7*   D-Dimer: No results found for this basename: DDIMER:2 in the last 72 hours CBG:  Basename 09/16/11  0609 09/15/11 2059 09/15/11 1714 09/15/11 1155  GLUCAP 107* 115* 151* 144*   Hemoglobin A1C: No results found for this basename: HGBA1C in the last 72 hours Fasting Lipid Panel: No results found for this basename: CHOL,HDL,LDLCALC,TRIG,CHOLHDL,LDLDIRECT in the last 72 hours Thyroid Function Tests: No results found for this basename: TSH,T4TOTAL,FREET4,T3FREE,THYROIDAB in the last 72 hours Anemia Panel: No results found for this basename: VITAMINB12,FOLATE,FERRITIN,TIBC,IRON,RETICCTPCT in the last 72 hours Coagulation:  Basename 09/16/11 0813 09/15/11 1126  LABPROT 26.5* 24.8*  INR 2.39* 2.20*   Studies/Results: Dg Chest Portable 1 View  09/15/2011  *RADIOLOGY REPORT*  Clinical Data: Cough and shortness of breath.  History of COPD.  PORTABLE CHEST - 1 VIEW  Comparison: 09/21/2010  Findings: The patient is status post median sternotomy and CABG procedure. There is a right chest wall pacer device with leads in the right atrial appendage right ventricle.  Heart size is normal.  There is a peripheral opacity within the right midlung which appears new from previous exam.  A left pleural effusion is identified and there is airspace consolidation involving the left lower lobe.  Plate-like atelectasis is noted in the right base.  IMPRESSION:  1.  Right midlung and left lower lobe airspace opacities worrisome for multifocal pneumonia. 2.  Left effusion.  Original Report Authenticated By: Rosealee Albee, M.D.    Medications: Scheduled Meds:   . ampicillin-sulbactam (UNASYN) IV  3 g  Intravenous Q8H  . azithromycin (ZITHROMAX) 500 MG IVPB  500 mg Intravenous Once  . digoxin  125 mcg Oral Daily  . diltiazem  240 mg Oral Daily  . ezetimibe  10 mg Oral Daily  . furosemide  20 mg Intravenous BID  . levalbuterol  0.63 mg Nebulization TID  . montelukast  5 mg Oral QHS  . pantoprazole  40 mg Oral Daily  . potassium chloride SA  20 mEq Oral BID  . predniSONE  10 mg Oral QAC breakfast  . sertraline   50 mg Oral Daily  . sotalol  80 mg Oral BID  . tiotropium  18 mcg Inhalation Daily  . warfarin  1 mg Oral ONCE-1800  . warfarin  1 mg Oral ONCE-1800  . DISCONTD: albuterol  2.5 mg Nebulization QID  . DISCONTD: furosemide  20 mg Oral BID  . DISCONTD: montelukast  5 mg Oral QHS  . DISCONTD: potassium chloride  20 mEq Oral BID  . DISCONTD: warfarin  0.5-1 mg Oral Daily   Continuous Infusions:  PRN Meds:.acetaminophen, acetaminophen, docusate sodium, DISCONTD: ondansetron (ZOFRAN) IV  Assessment/Plan:  Active Problems:  Atrial fibrillation Continue diltiazem, digoxin, coumadin.  Chronic anticoagulation Continue coumadin.  COPD with exacerbation,  Continue duoneb nebulizer, prednisone taper.  Pneumonia Continue Unasyn. CHF: Will change the lasix to 20 mg iv bid.    LOS: 1 day   LAMA,GAGAN S 09/16/2011, 1:22 PM

## 2011-09-16 NOTE — Progress Notes (Addendum)
  Echocardiogram 2D Echocardiogram has been performed.  Dewitt Hoes, RDCS 09/16/2011, 9:18 AM

## 2011-09-16 NOTE — Progress Notes (Signed)
ANTICOAGULATION CONSULT NOTE - Follow Up Consult  Pharmacy Consult for Coumadin Indication: atrial fibrillation  Allergies  Allergen Reactions  . Atorvastatin     Forgetfulness   . Cephalexin     Unsure as to reaction. "I can take Penicillin"  . Codeine Nausea And Vomiting  . Crestor (Rosuvastatin Calcium)     Forgetfulness    . Estradiol     Unknown reaction  . Hydrocortisone Hives  . Mevacor Other (See Comments)    forgetfullness,  . Moxifloxacin     Unknown reaction  . Sulfonamide Derivatives     itching    Patient Measurements: Height: 5' 2.5" (158.8 cm) Weight: 128 lb 15.5 oz (58.5 kg) IBW/kg (Calculated) : 51.25  Adjusted Body Weight:   Vital Signs: Temp: 97.6 F (36.4 C) (11/11 0948) Temp src: Oral (11/11 0948) BP: 138/63 mmHg (11/11 0948) Pulse Rate: 69  (11/11 0948)  Labs:  Basename 09/16/11 0814 09/16/11 0813 09/15/11 2330 09/15/11 1126  HGB -- -- -- 12.5  HCT -- -- -- 38.2  PLT -- -- -- 151  APTT -- -- -- 38*  LABPROT -- 26.5* -- 24.8*  INR -- 2.39* -- 2.20*  HEPARINUNFRC -- -- -- --  CREATININE -- -- -- 0.69  CKTOTAL 135 -- 129 43  CKMB 3.6 -- 3.6 2.3  TROPONINI <0.30 -- <0.30 <0.30   Estimated Creatinine Clearance: 41.6 ml/min (by C-G formula based on Cr of 0.69).  Assessment: 87yof on Coumadin for Afib. INR (2.39) is therapeutic on home regimen (1mg  daily except 0.5mg  on Tue/Thur). No bleeding reported.  Goal of Therapy:  INR 2-3   Plan:  1. Coumadin 1mg  po x1 today 2. Follow-up AM INR   Cleon Dew [phone] 09/16/2011,1:06 PM

## 2011-09-17 ENCOUNTER — Telehealth: Payer: Self-pay | Admitting: Internal Medicine

## 2011-09-17 ENCOUNTER — Inpatient Hospital Stay (HOSPITAL_COMMUNITY): Payer: Medicare Other

## 2011-09-17 DIAGNOSIS — R0902 Hypoxemia: Secondary | ICD-10-CM

## 2011-09-17 DIAGNOSIS — J449 Chronic obstructive pulmonary disease, unspecified: Secondary | ICD-10-CM

## 2011-09-17 DIAGNOSIS — R042 Hemoptysis: Secondary | ICD-10-CM

## 2011-09-17 DIAGNOSIS — R0602 Shortness of breath: Secondary | ICD-10-CM

## 2011-09-17 LAB — CBC
Hemoglobin: 11.6 g/dL — ABNORMAL LOW (ref 12.0–15.0)
MCH: 26.8 pg (ref 26.0–34.0)
MCV: 84.5 fL (ref 78.0–100.0)
RBC: 4.33 MIL/uL (ref 3.87–5.11)

## 2011-09-17 LAB — PROTIME-INR
INR: 2.22 — ABNORMAL HIGH (ref 0.00–1.49)
Prothrombin Time: 25 seconds — ABNORMAL HIGH (ref 11.6–15.2)

## 2011-09-17 LAB — BASIC METABOLIC PANEL
CO2: 30 mEq/L (ref 19–32)
Calcium: 8.9 mg/dL (ref 8.4–10.5)
Creatinine, Ser: 0.65 mg/dL (ref 0.50–1.10)
Glucose, Bld: 98 mg/dL (ref 70–99)

## 2011-09-17 LAB — EXPECTORATED SPUTUM ASSESSMENT W GRAM STAIN, RFLX TO RESP C

## 2011-09-17 MED ORDER — GUAIFENESIN ER 600 MG PO TB12
1200.0000 mg | ORAL_TABLET | Freq: Two times a day (BID) | ORAL | Status: DC
  Administered 2011-09-17 – 2011-09-21 (×9): 1200 mg via ORAL
  Filled 2011-09-17 (×10): qty 2

## 2011-09-17 MED ORDER — FUROSEMIDE 10 MG/ML IJ SOLN
INTRAMUSCULAR | Status: AC
  Administered 2011-09-17: 20 mg via INTRAVENOUS
  Filled 2011-09-17: qty 4

## 2011-09-17 MED ORDER — LEVALBUTEROL HCL 0.63 MG/3ML IN NEBU
0.6300 mg | INHALATION_SOLUTION | Freq: Four times a day (QID) | RESPIRATORY_TRACT | Status: DC
  Administered 2011-09-17 – 2011-09-21 (×16): 0.63 mg via RESPIRATORY_TRACT
  Filled 2011-09-17 (×19): qty 3

## 2011-09-17 MED ORDER — LEVALBUTEROL HCL 0.63 MG/3ML IN NEBU
0.6300 mg | INHALATION_SOLUTION | RESPIRATORY_TRACT | Status: DC | PRN
  Filled 2011-09-17: qty 3

## 2011-09-17 MED ORDER — FUROSEMIDE 10 MG/ML IJ SOLN
20.0000 mg | Freq: Two times a day (BID) | INTRAMUSCULAR | Status: DC
  Administered 2011-09-17 – 2011-09-20 (×6): 20 mg via INTRAVENOUS
  Filled 2011-09-17 (×6): qty 2

## 2011-09-17 MED ORDER — METHYLPREDNISOLONE SODIUM SUCC 125 MG IJ SOLR
60.0000 mg | Freq: Three times a day (TID) | INTRAMUSCULAR | Status: DC
  Administered 2011-09-17 – 2011-09-20 (×10): 60 mg via INTRAVENOUS
  Filled 2011-09-17 (×11): qty 0.96
  Filled 2011-09-17: qty 2

## 2011-09-17 MED ORDER — LEVALBUTEROL TARTRATE 45 MCG/ACT IN AERO
2.0000 | INHALATION_SPRAY | Freq: Four times a day (QID) | RESPIRATORY_TRACT | Status: DC | PRN
  Administered 2011-09-18: 2 via RESPIRATORY_TRACT
  Filled 2011-09-17: qty 15

## 2011-09-17 MED ORDER — WARFARIN SODIUM 1 MG PO TABS
1.0000 mg | ORAL_TABLET | ORAL | Status: DC
  Administered 2011-09-17: 1 mg via ORAL
  Filled 2011-09-17 (×2): qty 1

## 2011-09-17 MED ORDER — WARFARIN 0.5 MG HALF TABLET
0.5000 mg | ORAL_TABLET | ORAL | Status: DC
  Administered 2011-09-18: 0.5 mg via ORAL
  Filled 2011-09-17: qty 1

## 2011-09-17 MED ORDER — MOMETASONE FURO-FORMOTEROL FUM 100-5 MCG/ACT IN AERO
2.0000 | INHALATION_SPRAY | Freq: Two times a day (BID) | RESPIRATORY_TRACT | Status: DC
  Administered 2011-09-18 – 2011-09-21 (×6): 2 via RESPIRATORY_TRACT

## 2011-09-17 MED ORDER — PIPERACILLIN-TAZOBACTAM 3.375 G IVPB
3.3750 g | Freq: Three times a day (TID) | INTRAVENOUS | Status: DC
Start: 2011-09-17 — End: 2011-09-19
  Administered 2011-09-17 – 2011-09-19 (×6): 3.375 g via INTRAVENOUS
  Filled 2011-09-17 (×8): qty 50

## 2011-09-17 MED ORDER — FUROSEMIDE 10 MG/ML IJ SOLN: 40.0000 mg | Freq: Two times a day (BID) | INTRAMUSCULAR | Status: DC

## 2011-09-17 MED ORDER — GUAIFENESIN ER 600 MG PO TB12
600.0000 mg | ORAL_TABLET | Freq: Two times a day (BID) | ORAL | Status: DC
Start: 2011-09-17 — End: 2011-09-17
  Filled 2011-09-17 (×3): qty 1

## 2011-09-17 NOTE — Progress Notes (Signed)
Utilization review completed. [name] 09/17/2011 

## 2011-09-17 NOTE — Progress Notes (Signed)
Subjective: Patient seen and examined, still having shortness of breath.  Objective: Vital signs in last 24 hours: Temp:  [98.2 F (36.8 C)-98.5 F (36.9 C)] 98.4 F (36.9 C) (11/12 1324) Pulse Rate:  [60-69] 60  (11/12 1324) Resp:  [16-20] 20  (11/12 1324) BP: (113-132)/(56-62) 113/56 mmHg (11/12 1324) SpO2:  [90 %-92 %] 92 % (11/12 1324) Weight:  [58.2 kg (128 lb 4.9 oz)] 128 lb 4.9 oz (58.2 kg) (11/12 0436) Weight change: -0.5 kg (-1 lb 1.6 oz) Last BM Date: 09/15/11  Intake/Output from previous day: 11/11 0701 - 11/12 0700 In: 1680 [P.O.:1280; IV Piggyback:400] Out: 2100 [Urine:2100] Total I/O In: 240 [P.O.:240] Out: 400 [Urine:400]   Physical Exam: General: Alert, awake, oriented x3, in no acute distress. HEENT: No bruits, no goiter. Heart: Regular rate and rhythm, without murmurs, rubs, gallops. Lungs: Bilateral rhonchi Abdomen: Soft, nontender, nondistended, positive bowel sounds. Extremities: No clubbing cyanosis or edema with positive pedal pulses. Neuro: Grossly intact, nonfocal.    Lab Results: Basic Metabolic Panel:  Basename 09/17/11 0528 09/15/11 1126  NA 136 134*  K 3.4* 3.9  CL 98 98  CO2 30 27  GLUCOSE 98 151*  BUN 11 17  CREATININE 0.65 0.69  CALCIUM 8.9 9.2  MG -- --  PHOS -- --   Liver Function Tests:  Basename 09/15/11 1126  AST 19  ALT 18  ALKPHOS 41  BILITOT 0.5  PROT 6.4  ALBUMIN 3.5   No results found for this basename: LIPASE:2,AMYLASE:2 in the last 72 hours No results found for this basename: AMMONIA:2 in the last 72 hours CBC:  Basename 09/17/11 0528 09/15/11 1126  WBC 13.4* 23.0*  NEUTROABS -- 20.0*  HGB 11.6* 12.5  HCT 36.6 38.2  MCV 84.5 83.8  PLT 146* 151   Cardiac Enzymes:  Basename 09/16/11 1600 09/16/11 0814 09/15/11 2330  CKTOTAL 143 135 129  CKMB 4.1* 3.6 3.6  CKMBINDEX -- -- --  TROPONINI <0.30 <0.30 <0.30   BNP:  Basename 09/15/11 1130  POCBNP 978.7*   D-Dimer: No results found for this  basename: DDIMER:2 in the last 72 hours CBG:  Basename 09/16/11 0609 09/15/11 2059 09/15/11 1714 09/15/11 1155  GLUCAP 107* 115* 151* 144*   Hemoglobin A1C: No results found for this basename: HGBA1C in the last 72 hours Fasting Lipid Panel: No results found for this basename: CHOL,HDL,LDLCALC,TRIG,CHOLHDL,LDLDIRECT in the last 72 hours Thyroid Function Tests: No results found for this basename: TSH,T4TOTAL,FREET4,T3FREE,THYROIDAB in the last 72 hours Anemia Panel: No results found for this basename: VITAMINB12,FOLATE,FERRITIN,TIBC,IRON,RETICCTPCT in the last 72 hours Coagulation:  Basename 09/17/11 0528 09/16/11 0813  LABPROT 25.0* 26.5*  INR 2.22* 2.39*   Studies/Results: Dg Chest Portable 1 View  09/17/2011  *RADIOLOGY REPORT*  Clinical Data: Short of breath  PORTABLE CHEST - 1 VIEW  Comparison: 09/15/2011  Findings: Cardiac enlargement.  Vascular congestion with possible interstitial edema has developed since the prior study.  Bibasilar atelectasis or infiltrate remains.  Right upper lobe irregular density located laterally is unchanged. This could be due to pneumonia or mass lesion.  If this does  not clear with medical treatment, CT chest is suggested to rule out underlying neoplasm.  Small left pleural effusion is present.  IMPRESSION: Increase in interstitial lung markings suggestive of interstitial edema compared with  the prior study.  Increase in bibasilar atelectasis/infiltrate.  Right upper lobe peripheral density is unchanged and may represent infiltrate or   neoplasm.  This is not identified on the study of 09/21/2010.  If this does not resolve with medical treatment, CT chest is suggested to evaluate for underlying neoplasm.  Original Report Authenticated By: Camelia Phenes, M.D.    Medications: Scheduled Meds:    . digoxin  125 mcg Oral Daily  . diltiazem  240 mg Oral Daily  . ezetimibe  10 mg Oral Daily  . furosemide  20 mg Intravenous BID  . guaiFENesin  1,200 mg  Oral BID  . levalbuterol  0.63 mg Nebulization Q6H  . methylPREDNISolone (SOLU-MEDROL) injection  60 mg Intravenous Q8H  . mometasone-formoterol  2 puff Inhalation BID  . montelukast  5 mg Oral QHS  . pantoprazole  40 mg Oral Daily  . piperacillin-tazobactam (ZOSYN)  IV  3.375 g Intravenous Q8H  . potassium chloride SA  20 mEq Oral BID  . sertraline  50 mg Oral Daily  . sotalol  80 mg Oral BID  . tiotropium  18 mcg Inhalation Daily  . warfarin  0.5 mg Oral Custom  . warfarin  1 mg Oral ONCE-1800  . warfarin  1 mg Oral Custom  . DISCONTD: ampicillin-sulbactam (UNASYN) IV  3 g Intravenous Q8H  . DISCONTD: guaiFENesin  600 mg Oral BID  . DISCONTD: levalbuterol  0.63 mg Nebulization TID  . DISCONTD: predniSONE  10 mg Oral QAC breakfast   Continuous Infusions:  PRN Meds:.acetaminophen, acetaminophen, docusate sodium, levalbuterol, levalbuterol  Assessment/Plan:  Active Problems:  Atrial fibrillation Continue diltiazem, digoxin, coumadin.  Chronic anticoagulation Continue coumadin.  COPD with exacerbation,  Pulmonary consult. Will d/c prednisone, and start IV Solumedrol 60 mg Q 8 hr.  Pneumonia Continue Zosyn. CHF:  lasix  20 mg iv bid. Will check bnp   LOS: 2 days   Deneisha Dade S 09/17/2011, 4:41 PM

## 2011-09-17 NOTE — Progress Notes (Signed)
pharmac ANTIBIOTIC CONSULT NOTE - INITIAL  Pharmacy Consult for Zosyn  Indication: rule out pneumonia  Allergies  Allergen Reactions  . Atorvastatin     Forgetfulness   . Cephalexin     Unsure as to reaction. "I can take Penicillin"  . Codeine Nausea And Vomiting  . Crestor (Rosuvastatin Calcium)     Forgetfulness    . Estradiol     Unknown reaction  . Hydrocortisone Hives  . Mevacor Other (See Comments)    forgetfullness,  . Moxifloxacin     Unknown reaction  . Sulfonamide Derivatives     itching    Patient Measurements: Height: 5' 2.5" (158.8 cm) Weight: 128 lb 4.9 oz (58.2 kg) IBW/kg (Calculated) : 51.25  Adjusted Body Weight:   Vital Signs: Temp: 98.4 F (36.9 C) (11/12 1324) Temp src: Oral (11/12 1324) BP: 113/56 mmHg (11/12 1324) Pulse Rate: 60  (11/12 1324) Intake/Output from previous day: 11/11 0701 - 11/12 0700 In: 1680 [P.O.:1280; IV Piggyback:400] Out: 2100 [Urine:2100] Intake/Output from this shift: Total I/O In: 240 [P.O.:240] Out: 400 [Urine:400]  Labs:  [facility] 09/17/11 0528 09/15/11 1126  WBC 13.4* 23.0*  HGB 11.6* 12.5  PLT 146* 151  LABCREA -- --  CREATININE 0.65 0.69   Estimated Creatinine Clearance: 41.6 ml/min (by C-G formula based on Cr of 0.65). No results found for this basename: VANCOTROUGH:2,VANCOPEAK:2,VANCORANDOM:2,GENTTROUGH:2,GENTPEAK:2,GENTRANDOM:2,TOBRATROUGH:2,TOBRAPEAK:2,TOBRARND:2,AMIKACINPEAK:2,AMIKACINTROU:2,AMIKACIN:2, in the last 72 hours   Microbiology: No results found for this or any previous visit (from the past 720 hour(s)).  Medical History: Past Medical History  Diagnosis Date  . Asthmatic bronchitis   . Allergic rhinitis   . Atrial fibrillation   . History of aortic valve replacement Dr. Tyrone Sage 2006    Has severe residual gradient and will be managed conservatively  . Sleep apnea   . Chronic obstructive asthma   . Osteoporosis   . Pruritic disorder   . Diverticulosis   . Hemorrhoids   .  GERD (gastroesophageal reflux disease)   . IBS (irritable bowel syndrome)   . Pacemaker 2010  . Oxygen dependent   . Atrial flutter     on Betapace  . High risk medication use     on Betapace & Coumadin  . Chronic anticoagulation   . COPD (chronic obstructive pulmonary disease)   . Pneumonia   . Anemia   . Arthritis   . Hypertension   . Coronary artery disease     Medications:  Scheduled:    . digoxin  125 mcg Oral Daily  . diltiazem  240 mg Oral Daily  . ezetimibe  10 mg Oral Daily  . furosemide  20 mg Intravenous BID  . guaiFENesin  1,200 mg Oral BID  . levalbuterol  0.63 mg Nebulization Q6H  . methylPREDNISolone (SOLU-MEDROL) injection  60 mg Intravenous Q8H  . mometasone-formoterol  2 puff Inhalation BID  . montelukast  5 mg Oral QHS  . pantoprazole  40 mg Oral Daily  . potassium chloride SA  20 mEq Oral BID  . sertraline  50 mg Oral Daily  . sotalol  80 mg Oral BID  . tiotropium  18 mcg Inhalation Daily  . warfarin  0.5 mg Oral Custom  . warfarin  1 mg Oral ONCE-1800  . warfarin  1 mg Oral Custom  . DISCONTD: ampicillin-sulbactam (UNASYN) IV  3 g Intravenous Q8H  . DISCONTD: guaiFENesin  600 mg Oral BID  . DISCONTD: levalbuterol  0.63 mg Nebulization TID  . DISCONTD: predniSONE  10 mg Oral QAC  breakfast   Assessment: 77 yo F on day 3 of Unasyn for r/o PNA. Unasyn now d/c, and pharmacy consulted to manage Zosyn.    Plan:  1. Zosyn 3.375gm IV Q8H (4 hour infusion)   Emeline Gins 09/17/2011,2:25 PM

## 2011-09-17 NOTE — Progress Notes (Signed)
Can cxr be done portable..the patient wants mucinex.

## 2011-09-17 NOTE — Progress Notes (Signed)
ANTICOAGULATION CONSULT NOTE - Follow Up Consult  Pharmacy Consult for Coumadin Indication: atrial fibrillation  Allergies  Allergen Reactions  . Atorvastatin     Forgetfulness   . Cephalexin     Unsure as to reaction. "I can take Penicillin"  . Codeine Nausea And Vomiting  . Crestor (Rosuvastatin Calcium)     Forgetfulness    . Estradiol     Unknown reaction  . Hydrocortisone Hives  . Mevacor Other (See Comments)    forgetfullness,  . Moxifloxacin     Unknown reaction  . Sulfonamide Derivatives     itching    Patient Measurements: Height: 5' 2.5" (158.8 cm) Weight: 128 lb 4.9 oz (58.2 kg) IBW/kg (Calculated) : 51.25  Adjusted Body Weight:   Vital Signs: Temp: 98.5 F (36.9 C) (11/12 0436) Temp src: Oral (11/12 0436) BP: 132/62 mmHg (11/12 0436) Pulse Rate: 60  (11/12 0436)  Labs:  Basename 09/17/11 0528 09/16/11 1600 09/16/11 0814 09/16/11 0813 09/15/11 2330 09/15/11 1126  HGB 11.6* -- -- -- -- 12.5  HCT 36.6 -- -- -- -- 38.2  PLT 146* -- -- -- -- 151  APTT -- -- -- -- -- 38*  LABPROT 25.0* -- -- 26.5* -- 24.8*  INR 2.22* -- -- 2.39* -- 2.20*  HEPARINUNFRC -- -- -- -- -- --  CREATININE 0.65 -- -- -- -- 0.69  CKTOTAL -- 143 135 -- 129 --  CKMB -- 4.1* 3.6 -- 3.6 --  TROPONINI -- <0.30 <0.30 -- <0.30 --   Estimated Creatinine Clearance: 41.6 ml/min (by C-G formula based on Cr of 0.65).  Assessment: 87yof on Coumadin for Afib. INR is at-goal on home regimen (1mg  daily except 0.5mg  on Tue/Thur).  Goal of Therapy:  INR 2-3   Plan:  1. Continue home regimen of 1mg  Coumadin po daily, except 0.5mg  on Tues, Thurs.  2. Follow-up AM INR   Emeline Gins [phone] 09/17/2011,9:21 AM

## 2011-09-17 NOTE — Consult Note (Signed)
Reason for Consult:  COPD, SOB Referring Physician: [facility] 11/12  Date: 09/17/2011  Patient name: [name] Medical record number: [id number] Date of birth: 29-Aug-1924 Age: 77 y.o. Gender: female PCP: [name], MD, MD   Microbiology/Sepsis markers:   Anti-infectives:  11/12 Unasyn>>>11/12 11/12 Zosyn>>>   Best Practice/Protocols:  GI: Protonix DVT: Coumadin  Consults: Treatment Team:  Meredeth Ide    Studies: Dg Chest Portable 1 View 09/15/2011   Right midlung and left lower lobe airspace opacities worrisome for multifocal pneumonia.  Left effusion.  Original Report Authenticated By: Rosealee Albee, M.D.     Key Events: 11/11 2D ECHO>>>- Mild LVH, EF estimated 60%, PA peak pressure 43mm Hg.  RA/LA mildly dilated. Mild MR, MS   History of Present Illness:  77 y/o F with PMH of afib on coumadin, OSA, asthma, GERD, O2 dependent COPD (baseline 3-3.5 L/[place]), CAD who presented to [facility]  Admitted 10/11 with c/o SOB, cough with green sputum with blood streaks and  L hip pain.  CXR evaluation in ED demonstrated L PNA.  Patient admitted per [facility] and PCCM consulted for pulmonary assistance (pt followed by Dr. Maple Hudson, last seen 07/23/11 for nasal congestion & epistaxis).     Allergies: Atorvastatin; Cephalexin; Codeine; Crestor; Estradiol; Hydrocortisone; Mevacor; Moxifloxacin; and Sulfonamide derivatives  Past Medical History  Diagnosis Date  . Asthmatic bronchitis   . Allergic rhinitis   . Atrial fibrillation   . History of aortic valve replacement Dr. Tyrone Sage 2006    Has severe residual gradient and will be managed conservatively  . Sleep apnea   . Chronic obstructive asthma   . Osteoporosis   . Pruritic disorder   . Diverticulosis   . Hemorrhoids   . GERD (gastroesophageal reflux disease)   . IBS (irritable bowel syndrome)   . Pacemaker 2010  . Oxygen dependent   . Atrial flutter     on Betapace  . High risk medication use     on Betapace & Coumadin  .  Chronic anticoagulation   . COPD (chronic obstructive pulmonary disease)   . Pneumonia   . Anemia   . Arthritis   . Hypertension   . Coronary artery disease     Past Surgical History  Procedure Date  . Back surgery 1994  . Tonsillectomy   . Appendectomy   . Ganglion cyst excision   . Vocal cord polyps 1975 and 1984  . Aortic valve replacement 2006  . Pacemaker insertion 2010  . Cardiac electrophysiology mapping and ablation   . Coronary artery bypass graft   . Insert / replace / remove pacemaker   . Eye surgery   . Cardiac catheterization   . Coronary angioplasty   . Cardiac valve replacement     Family History  Problem Relation Age of Onset  . Kidney failure Father   . Colon cancer Paternal Aunt   . Heart disease Maternal Grandfather   . Heart attack Paternal Grandfather   . Breast cancer Daughter   . Stroke Maternal Grandmother   . Heart disease Mother     History   Social History  . Marital Status: Married    Spouse Name: N/A    Number of Children: N/A  . Years of Education: N/A   Occupational History  . retired    Social History Main Topics  . Smoking status: Former Smoker -- 1.0 packs/day for 50 years    Types: Cigarettes    Quit date: 11/05/1993  . Smokeless tobacco:  Never Used  . Alcohol Use: No  . Drug Use: No  . Sexually Active: No   Other Topics Concern  . Not on file   Social History Narrative  . No narrative on file   Prior to Admission medications   Medication Sig Start Date End Date Taking? Authorizing Provider  digoxin (LANOXIN) 0.125 MG tablet Take 1 tablet (125 mcg total) by mouth daily. 09/06/11  Yes Elyn Aquas., MD  diltiazem (CARDIZEM CD) 240 MG 24 hr capsule Take 1 capsule (240 mg total) by mouth daily. 09/06/11  Yes Elyn Aquas., MD  diphenhydrAMINE (CHILDRENS ALLERGY) 12.5 MG/5ML liquid Take 12.5 mg by mouth at bedtime.    Yes Historical Provider, MD  Docusate Calcium (STOOL SOFTENER PO) Take 100 mg by mouth daily.     Yes Historical Provider, MD  DULERA 200-5 MCG/ACT AERO USE 2 PUFFS TWO TIMES A DAY AS NEEDED 06/05/11  Yes Waymon Budge, MD  ezetimibe (ZETIA) 10 MG tablet Take 1 tablet (10 mg total) by mouth daily. 05/25/11  Yes Elyn Aquas., MD  furosemide (LASIX) 40 MG tablet Take 20 mg by mouth 2 (two) times daily.     Yes Historical Provider, MD  furosemide (LASIX) 40 MG tablet Take 20 mg by mouth 2 (two) times daily.     Yes Historical Provider, MD  Guaifenesin (MUCINEX MAXIMUM STRENGTH) 1200 MG TB12 Take 1,200 mg by mouth 2 (two) times daily at 10 AM and 5 PM.     Yes Historical Provider, MD  IRON PO Take 1 tablet by mouth daily.     Yes Historical Provider, MD  levalbuterol Pauline Aus) 0.63 MG/3ML nebulizer solution Take 1 ampule by nebulization 3 (three) times daily.     Yes Historical Provider, MD  methylPREDNISolone (MEDROL) 4 MG tablet Take 4 mg by mouth daily.     Yes Historical Provider, MD  montelukast (SINGULAIR) 10 MG tablet TAKE 1 TABLET DAILY FOR ASTHMA 09/05/11  Yes Waymon Budge, MD  NEXIUM 40 MG capsule TAKE 1 TABLET BY MOUTH ONCE A DAY 07/09/11  Yes Pecola Lawless, MD  potassium chloride SA (K-DUR,KLOR-CON) 20 MEQ tablet Take 20 mEq by mouth 2 (two) times daily.     Yes Historical Provider, MD  sertraline (ZOLOFT) 50 MG tablet Take 50 mg by mouth daily.    Yes Yancey Flemings, MD  sotalol (BETAPACE) 80 MG tablet Take 80 mg by mouth 2 (two) times daily.     Yes Historical Provider, MD  SPIRIVA HANDIHALER 18 MCG inhalation capsule INHALE CONTENTS OF 1 CAPSULE ONCE A DAY 07/06/11 07/09/12 Yes Clinton D Young, MD  warfarin (COUMADIN) 1 MG tablet Take 0.5-1 mg by mouth daily. Take 0.5 tablet on Tuesday & Thursday Take 1 tablet on all other days   Yes Historical Provider, MD  Alum & Mag Hydroxide-Simeth (MAGIC MOUTHWASH) SOLN Take 5 mLs by mouth 4 (four) times daily as needed. For mouth irritations     Historical Provider, MD  levalbuterol (XOPENEX HFA) 45 MCG/ACT inhaler Inhale 2 puffs into the  lungs every 6 (six) hours as needed. For shortness of breath    Historical Provider, MD  levalbuterol (XOPENEX) 1.25 MG/3ML nebulizer solution Take 1 ampule by nebulization 3 (three) times daily. DX:  493.20  05/18/11 08/16/11  Waymon Budge, MD  Omega-3 Fatty Acids (FISH OIL PO) Take 1 capsule by mouth 2 (two) times daily.      Historical Provider, MD  sodium chloride ([employer] SALINE NASAL  SPRAY) 0.65 % nasal spray Place 1 spray into the nose as needed. For nose bleeds    Historical Provider, MD        Review of Systems: Constitutional:   No  weight loss, night sweats,  Fevers, chills, fatigue. HEENT:   No headaches,  Difficulty swallowing,  Tooth/dental problems,  Sore throat,                  No sneezing, itching, ear ache, nasal congestion, post nasal drip,   CV:  No chest pain,  Orthopnea, PND, swelling in lower extremities, anasarca,         dizziness, palpitations  GI:  No heartburn, indigestion, abdominal pain, nausea, vomiting, diarrhea, change in       bowel habits, loss of appetite  Resp:  Endorses cough, shortness of breath, mild sputum production that is green with occasional blood streaks.   Skin: no rash or lesions.  GU: no dysuria, change in color of urine, no urgency or frequency.  No flank pain.  MS:  No joint pain or swelling.  No decreased range of motion.  No back pain.  Psych:  No change in mood or affect. No depression or anxiety.  No memory loss.    Physical Exam:  Filed Vitals:   09/16/11 2009 09/16/11 2058 09/17/11 0436 09/17/11 0748  BP:  125/59 132/62   Pulse: 69 67 60   Temp:  98.2 F (36.8 C) 98.5 F (36.9 C)   TempSrc:  Oral Oral   Resp: 20 16 16    Height:      Weight:   128 lb 4.9 oz (58.2 kg)   SpO2: 92% 92% 90% 90%   EXAM:  Gen: Pleasant, well-nourished, in no distress,  normal affect ENT: No lesions,  mouth clear,  oropharynx clear, no postnasal drip Neck: No JVD, no TMG, no carotid bruits Lungs: No use of accessory muscles, no dullness  to percussion, clear without rales or rhonchi Cardiovascular: RRR, heart sounds normal, no murmur or gallops, no peripheral edema Abdomen: soft and NT, no HSM,  BS normal Musculoskeletal: No deformities, no cyanosis or clubbing Neuro: alert, non focal Skin: Warm, no lesions or rashes  Labs   CBC    Component Value Date/Time   WBC 13.4* 09/17/2011 0528   RBC 4.33 09/17/2011 0528   HGB 11.6* 09/17/2011 0528   HCT 36.6 09/17/2011 0528   PLT 146* 09/17/2011 0528   MCV 84.5 09/17/2011 0528   MCH 26.8 09/17/2011 0528   MCHC 31.7 09/17/2011 0528   RDW 16.7* 09/17/2011 0528   LYMPHSABS 1.1 09/15/2011 1126   MONOABS 1.8* 09/15/2011 1126   EOSABS 0.0 09/15/2011 1126   BASOSABS 0.0 09/15/2011 1126     BMET    Component Value Date/Time   NA 136 09/17/2011 0528   K 3.4* 09/17/2011 0528   CL 98 09/17/2011 0528   CO2 30 09/17/2011 0528   GLUCOSE 98 09/17/2011 0528   BUN 11 09/17/2011 0528   CREATININE 0.65 09/17/2011 0528   CALCIUM 8.9 09/17/2011 0528   GFRNONAA 79* 09/17/2011 0528   GFRAA >90 09/17/2011 0528    .    Assessment & Plan   Patient Active Hospital Problem List: Atrial fibrillation (10/25/2007)  Chronic anticoagulation (08/22/2011)   COPD with exacerbation (09/15/2011)   Pneumonia (09/15/2011)    1. PNA  PLAN: -pulmonary hygiene -change unasyn to zosyn  2.COPD (on O2 at baseline), Asthma PLAN: -continue O2, currently at baseline needs -IV solu-medrol  -  singular -spiriva -increase xopenex to Q6 & Q3 PRN -increase mucinex -sputum culture -add home dulera -check bnp  3. Afib on chronic coumadin.  Suspect that cardiac is a significant contributing factor here and should be more closely evaluated specially if BNP is elevated. PLAN: - Per Primary S[facility] - Add BNP to next blood draw.     OLLIS,BRANDI  09/17/2011   Patient seen and examined, agree with above note.  I dictated the care and orders written for this patient under my  direction.  Koren Bound, M.D.

## 2011-09-17 NOTE — Telephone Encounter (Signed)
Called and spoke with pt's daughter Neysa Bonito. She states wants to inform [name] that pt has been admitted to [facility] with PNA- currently in room 4734. She states concerned that they are not giving her her neb tx first thing in the am like she is used to. She states that she also is having some "heart issues" as well as pulm. Will forward to [name] so that he is aware. I let Neysa Bonito know he is here seeing pt's am and pm today, but will let him know pt has been admitted.

## 2011-09-18 LAB — PRO B NATRIURETIC PEPTIDE: Pro B Natriuretic peptide (BNP): 944.2 pg/mL — ABNORMAL HIGH (ref 0–450)

## 2011-09-18 LAB — PROTIME-INR: Prothrombin Time: 28.2 seconds — ABNORMAL HIGH (ref 11.6–15.2)

## 2011-09-18 NOTE — Progress Notes (Signed)
77 year old female who came to the hospital after she had episode of shortness of breath last night. It is is that she was sleeping in the bed when the pain in her left hip woke up and she found that she was extremely short of breath. She also had a coughing spell and and was coughing up green-colored phlegm. As per patient she also noticed some blood in the phlegm. She also describes some chest pain though patient feels much better at this time. Patient has a history of COPD she is a smoker smoked for 50 years. Quit smoking in 1995. Chest x-ray done in the ER showed pneumonia.    patient was started on Unasyn for the pneumonia, and also on by mouth prednisone for the COPD exacerbation and later was changed to IV Solu-Medrol. Patient at this time is improved. Also she received IV Lasix for the lung  Congestion, 2-D echo showed mild LVH and EF of 60%. BNP improved after giving IV Lasix.     Subjective: Patient seen and examined, breathing has improved . Also been seen by Dr. Maple Hudson. Bnp  has improved .  Objective: Vital signs in last 24 hours: Temp:  [97.6 F (36.4 C)-98.3 F (36.8 C)] 97.6 F (36.4 C) (11/13 1342) Pulse Rate:  [59-65] 64  (11/13 1342) Resp:  [20-22] 20  (11/13 1342) BP: (116-134)/(62-70) 134/70 mmHg (11/13 1342) SpO2:  [89 %-94 %] 90 % (11/13 1342) Weight:  [58.3 kg (128 lb 8.5 oz)] 128 lb 8.5 oz (58.3 kg) (11/13 0422) Weight change: 0.1 kg (3.5 oz) Last BM Date: 09/16/11  Intake/Output from previous day: 11/12 0701 - 11/13 0700 In: 480 [P.O.:480] Out: 1800 [Urine:1800] Total I/O In: 480 [P.O.:480] Out: 652 [Urine:650; Stool:2]   Physical Exam: General: Alert, awake, oriented x3, in no acute distress. HEENT: No bruits, no goiter. Heart: Regular rate and rhythm, without murmurs, rubs, gallops. Lungs: Clear bilaterally Abdomen: Soft, nontender, nondistended, positive bowel sounds. Extremities: No clubbing cyanosis or edema with positive pedal pulses. Neuro:  Grossly intact, nonfocal.    Lab Results: Basic Metabolic Panel:  Basename 09/17/11 0528  NA 136  K 3.4*  CL 98  CO2 30  GLUCOSE 98  BUN 11  CREATININE 0.65  CALCIUM 8.9  MG --  PHOS --   Liver Function Tests: No results found for this basename: AST:2,ALT:2,ALKPHOS:2,BILITOT:2,PROT:2,ALBUMIN:2 in the last 72 hours No results found for this basename: LIPASE:2,AMYLASE:2 in the last 72 hours No results found for this basename: AMMONIA:2 in the last 72 hours CBC:  Basename 09/17/11 0528  WBC 13.4*  NEUTROABS --  HGB 11.6*  HCT 36.6  MCV 84.5  PLT 146*   Cardiac Enzymes:  Basename 09/16/11 1600 09/16/11 0814 09/15/11 2330  CKTOTAL 143 135 129  CKMB 4.1* 3.6 3.6  CKMBINDEX -- -- --  TROPONINI <0.30 <0.30 <0.30   BNP:  Basename 09/18/11 0630 09/17/11 1633  POCBNP 944.2* 734.8*   D-Dimer: No results found for this basename: DDIMER:2 in the last 72 hours CBG:  Basename 09/16/11 0609 09/15/11 2059 09/15/11 1714  GLUCAP 107* 115* 151*   Hemoglobin A1C: No results found for this basename: HGBA1C in the last 72 hours Fasting Lipid Panel: No results found for this basename: CHOL,HDL,LDLCALC,TRIG,CHOLHDL,LDLDIRECT in the last 72 hours Thyroid Function Tests: No results found for this basename: TSH,T4TOTAL,FREET4,T3FREE,THYROIDAB in the last 72 hours Anemia Panel: No results found for this basename: VITAMINB12,FOLATE,FERRITIN,TIBC,IRON,RETICCTPCT in the last 72 hours Coagulation:  Basename 09/18/11 0630 09/17/11 0528  LABPROT 28.2* 25.0*  INR 2.59* 2.22*   Studies/Results: Dg Chest Portable 1 View  09/17/2011  *RADIOLOGY REPORT*  Clinical Data: Short of breath  PORTABLE CHEST - 1 VIEW  Comparison: 09/15/2011  Findings: Cardiac enlargement.  Vascular congestion with possible interstitial edema has developed since the prior study.  Bibasilar atelectasis or infiltrate remains.  Right upper lobe irregular density located laterally is unchanged. This could be due to  pneumonia or mass lesion.  If this does  not clear with medical treatment, CT chest is suggested to rule out underlying neoplasm.  Small left pleural effusion is present.  IMPRESSION: Increase in interstitial lung markings suggestive of interstitial edema compared with  the prior study.  Increase in bibasilar atelectasis/infiltrate.  Right upper lobe peripheral density is unchanged and may represent infiltrate or   neoplasm.  This is not identified on the study of 09/21/2010.  If this does not resolve with medical treatment, CT chest is suggested to evaluate for underlying neoplasm.  Original Report Authenticated By: Camelia Phenes, M.D.    Medications: Scheduled Meds:    . digoxin  125 mcg Oral Daily  . diltiazem  240 mg Oral Daily  . ezetimibe  10 mg Oral Daily  . furosemide  20 mg Intravenous Q12H  . guaiFENesin  1,200 mg Oral BID  . levalbuterol  0.63 mg Nebulization Q6H  . methylPREDNISolone (SOLU-MEDROL) injection  60 mg Intravenous Q8H  . mometasone-formoterol  2 puff Inhalation BID  . montelukast  5 mg Oral QHS  . pantoprazole  40 mg Oral Daily  . piperacillin-tazobactam (ZOSYN)  IV  3.375 g Intravenous Q8H  . potassium chloride SA  20 mEq Oral BID  . sertraline  50 mg Oral Daily  . sotalol  80 mg Oral BID  . tiotropium  18 mcg Inhalation Daily  . warfarin  0.5 mg Oral Custom  . warfarin  1 mg Oral Custom  . DISCONTD: ampicillin-sulbactam (UNASYN) IV  3 g Intravenous Q8H  . DISCONTD: furosemide  20 mg Intravenous BID  . DISCONTD: furosemide  40 mg Intravenous BID  . DISCONTD: guaiFENesin  600 mg Oral BID  . DISCONTD: levalbuterol  0.63 mg Nebulization TID   Continuous Infusions:  PRN Meds:.acetaminophen, acetaminophen, docusate sodium, levalbuterol, levalbuterol  Assessment/Plan:  Active Problems:  Atrial fibrillation Continue diltiazem, digoxin, coumadin.  Chronic anticoagulation Continue coumadin.  COPD with exacerbation,  Pulmonary following. Continue IV  Solumedrol.  Pneumonia Continue Zosyn. CHF:  lasix  20 mg iv bid.  Dispo d/c in next 2-3 days.  LOS: 3 days   Carrine Kroboth S 09/18/2011, 1:57 PM

## 2011-09-18 NOTE — Progress Notes (Signed)
Subjective: 77 year old female who came to the hospital after she had episode of shortness of breath last night. It is is that she was sleeping in the bed when the pain in her left hip woke up and she found that she was extremely short of breath. She also had a coughing spell and and was coughing up green-colored phlegm. As per patient she also noticed some blood in the phlegm. She also describes some chest pain though patient feels much better at this time. Patient has a history of COPD she is a smoker smoked for 50 years. Quit smoking in 1995. Chest x-ray done in the ER showed pneumonia.  Pulmonary f/u- She reports definite improvement in dyspnea, no longer seeing heme in sputum ( common for her). She does say that sore throat and green sputum were noted just before admission,suggestijng coincident URI.   Objective: Vital signs in last 24 hours: Temp:  [98.2 F (36.8 C)-98.4 F (36.9 C)] 98.2 F (36.8 C) (11/13 0422) Pulse Rate:  [59-65] 59  (11/13 0422) Resp:  [20-22] 22  (11/13 0422) BP: (113-129)/(56-69) 129/69 mmHg (11/13 0422) SpO2:  [90 %-94 %] 91 % (11/13 0422) Weight:  [58.3 kg (128 lb 8.5 oz)] 128 lb 8.5 oz (58.3 kg) (11/13 0422)  Exam- Very elderly, very pleasant, well oriented LOL. Skin- few bruises. Nodes- nonbe found at neck Heart- regular pulse, 1/6 systolic murmur precordial Lungs- diminished, unlabored- nasal prong O2 Abd- protuberant, nontender. BS present Ext- trace edema calves  Lab Results:  [facility] 09/17/11 0528 09/15/11 1126  WBC 13.4* 23.0*  HGB 11.6* 12.5  HCT 36.6 38.2  PLT 146* 151   BMET  Basename 09/17/11 0528 09/15/11 1126  NA 136 134*  K 3.4* 3.9  CL 98 98  CO2 30 27  GLUCOSE 98 151*  BUN 11 17  CREATININE 0.65 0.69  CALCIUM 8.9 9.2    Studies/Results: Dg Chest Portable 1 View  09/17/2011  *RADIOLOGY REPORT*  Clinical Data: Short of breath  PORTABLE CHEST - 1 VIEW  Comparison: 09/15/2011  Findings: Cardiac enlargement.  Vascular  congestion with possible interstitial edema has developed since the prior study.  Bibasilar atelectasis or infiltrate remains.  Right upper lobe irregular density located laterally is unchanged. This could be due to pneumonia or mass lesion.  If this does  not clear with medical treatment, CT chest is suggested to rule out underlying neoplasm.  Small left pleural effusion is present.  IMPRESSION: Increase in interstitial lung markings suggestive of interstitial edema compared with  the prior study.  Increase in bibasilar atelectasis/infiltrate.  Right upper lobe peripheral density is unchanged and may represent infiltrate or   neoplasm.  This is not identified on the study of 09/21/2010.  If this does not resolve with medical treatment, CT chest is suggested to evaluate for underlying neoplasm.  Original Report Authenticated By: Camelia Phenes, M.D.    Medications: I have reviewed the patient's current medications.  Assessment/Plan: 1) Pneumonia- continues improving on antibiotics. Anticipate conversion to oral meds. 2) Afib- chronic, rate controlled 3)COPD exacerbation- moderate COPD. CHF and weakness usually dominate this. She is chronically O2 dependent 4)RUL peripheral infiltrate- nonspecific. Image reviewed by me. I is close to overlying pacemaker and would be best imaged by CT if it persists and she is considered strong enough for the CT.  LOS: 3 days   Doretta Remmert D 09/18/2011, 7:44 AM

## 2011-09-18 NOTE — Clinical Documentation Improvement (Signed)
CHF DOCUMENTATION CLARIFICATION QUERY                                                                                      09/18/11  Dr. Sharl Ma and[facility]or Associates,  In a better effort to capture your patient's severity of illness, reflect appropriate length of stay and utilization of resources, a review of the patient medical record has revealed the following indicators.   In responding to this query please exercise your independent judgment.  The fact that a query is asked, does not imply that any particular answer is desired or expected.  Based on your clinical judgment, please document in the progress notes and discharge summary, the ACUITY and TYPE of CHF:  ACUITY: Acute Chronic Acute on Chronic  AND  TYPE: Systolic Diastolic Combined   Supporting Information:  "CHF. Will check Echo"   Dictated H&P  "CHF"   Documented in progress notes  Lasix changed to 20 mg IV bid per progress note 09/16/11  BNP this admission:  978.7 734.8 944.2  Echo 09/16/11 EF 60% Peak PA Pressure  39mm/hg Normal RV Systolic Function     Reviewed: additional documentation in the medical record  Thank You,  Jerral Ralph RN BSN Certified Clinical Documentation Specialist: Cell - [phone]  Health Information Management [place]

## 2011-09-18 NOTE — Progress Notes (Signed)
ANTICOAGULATION CONSULT NOTE - Follow Up Consult  Pharmacy Consult for Coumadin Indication: atrial fibrillation  Allergies  Allergen Reactions  . Atorvastatin     Forgetfulness   . Cephalexin     Unsure as to reaction. "I can take Penicillin"  . Codeine Nausea And Vomiting  . Crestor (Rosuvastatin Calcium)     Forgetfulness    . Estradiol     Unknown reaction  . Hydrocortisone Hives  . Mevacor Other (See Comments)    forgetfullness,  . Moxifloxacin     Unknown reaction  . Sulfonamide Derivatives     itching    Patient Measurements: Height: 5' 2.5" (158.8 cm) Weight: 128 lb 8.5 oz (58.3 kg) IBW/kg (Calculated) : 51.25  Adjusted Body Weight:   Vital Signs: Temp: 98.2 F (36.8 C) (11/13 0422) BP: 129/69 mmHg (11/13 0422) Pulse Rate: 59  (11/13 0422)  Labs:  Basename 09/18/11 0630 09/17/11 0528 09/16/11 1600 09/16/11 0814 09/16/11 0813 09/15/11 2330 09/15/11 1126  HGB -- 11.6* -- -- -- -- 12.5  HCT -- 36.6 -- -- -- -- 38.2  PLT -- 146* -- -- -- -- 151  APTT -- -- -- -- -- -- 38*  LABPROT 28.2* 25.0* -- -- 26.5* -- --  INR 2.59* 2.22* -- -- 2.39* -- --  HEPARINUNFRC -- -- -- -- -- -- --  CREATININE -- 0.65 -- -- -- -- 0.69  CKTOTAL -- -- 143 135 -- 129 --  CKMB -- -- 4.1* 3.6 -- 3.6 --  TROPONINI -- -- <0.30 <0.30 -- <0.30 --   Estimated Creatinine Clearance: 41.6 ml/min (by C-G formula based on Cr of 0.65).  Assessment: 87yof on Coumadin for Afib. INR is at-goal on home regimen (1mg  daily except 0.5mg  on Tue/Thur).  Goal of Therapy:  INR 2-3   Plan:  1. Continue home regimen of 1mg  Coumadin po daily, except 0.5mg  on Tues, Thurs.  2. Follow-up AM INR   Emeline Gins [phone] 09/18/2011,9:32 AM

## 2011-09-19 LAB — CBC
Hemoglobin: 12.4 g/dL (ref 12.0–15.0)
MCH: 27 pg (ref 26.0–34.0)
RBC: 4.59 MIL/uL (ref 3.87–5.11)
WBC: 15.3 10*3/uL — ABNORMAL HIGH (ref 4.0–10.5)

## 2011-09-19 LAB — BASIC METABOLIC PANEL
GFR calc non Af Amer: 78 mL/min — ABNORMAL LOW (ref >90)
Glucose, Bld: 157 mg/dL — ABNORMAL HIGH (ref 70–99)
Potassium: 3.4 mEq/L — ABNORMAL LOW (ref 3.5–5.1)
Sodium: 130 mEq/L — ABNORMAL LOW (ref 135–145)

## 2011-09-19 LAB — PROTIME-INR: INR: 2.84 — ABNORMAL HIGH (ref 0.00–1.49)

## 2011-09-19 MED ORDER — WARFARIN 0.5 MG HALF TABLET
0.5000 mg | ORAL_TABLET | Freq: Once | ORAL | Status: AC
  Administered 2011-09-19: 0.5 mg via ORAL
  Filled 2011-09-19: qty 1

## 2011-09-19 MED ORDER — AMOXICILLIN-POT CLAVULANATE 875-125 MG PO TABS
1.0000 | ORAL_TABLET | Freq: Two times a day (BID) | ORAL | Status: DC
  Administered 2011-09-19 – 2011-09-21 (×5): 1 via ORAL
  Filled 2011-09-19 (×6): qty 1

## 2011-09-19 NOTE — Progress Notes (Signed)
ANTICOAGULATION CONSULT NOTE - Follow Up Consult  Pharmacy Consult for Coumadin Indication: atrial fibrillation  Allergies  Allergen Reactions  . Atorvastatin     Forgetfulness   . Cephalexin     Unsure as to reaction. "I can take Penicillin"  . Codeine Nausea And Vomiting  . Crestor (Rosuvastatin Calcium)     Forgetfulness    . Estradiol     Unknown reaction  . Hydrocortisone Hives  . Mevacor Other (See Comments)    forgetfullness,  . Moxifloxacin     Unknown reaction  . Sulfonamide Derivatives     itching    Patient Measurements: Height: 5' 2.5" (158.8 cm) Weight: 132 lb 0.9 oz (59.9 kg) (scale b) IBW/kg (Calculated) : 51.25  Adjusted Body Weight:   Vital Signs: Temp: 98.3 F (36.8 C) (11/14 0500) BP: 116/62 mmHg (11/14 0500) Pulse Rate: 59  (11/14 0500)  Labs:  Basename 09/19/11 0500 09/18/11 0630 09/17/11 0528 09/16/11 1600  HGB -- -- 11.6* --  HCT -- -- 36.6 --  PLT -- -- 146* --  APTT -- -- -- --  LABPROT 30.3* 28.2* 25.0* --  INR 2.84* 2.59* 2.22* --  HEPARINUNFRC -- -- -- --  CREATININE -- -- 0.65 --  CKTOTAL -- -- -- 143  CKMB -- -- -- 4.1*  TROPONINI -- -- -- <0.30   Estimated Creatinine Clearance: 41.6 ml/min (by C-G formula based on Cr of 0.65).  Assessment: 87yof on Coumadin for Afib. INR is at-goal.  Home Coumadin regimen = 1mg  daily except 0.5mg  on Tue/Thur.  Goal of Therapy:  INR 2-3   Plan:  1. Coumadin 0.5mg  po today. 2. Follow-up AM INR   Emeline Gins [phone] 09/19/2011,9:37 AM

## 2011-09-19 NOTE — Progress Notes (Signed)
Subjective: Continues to be short of breath but is improved from admission.  Still noting intermittent wheezing.  Objective: Vital signs in last 24 hours: Filed Vitals:   09/19/11 0100 09/19/11 0206 09/19/11 0500 09/19/11 1300  BP:   116/62   Pulse:  60 59   Temp:   98.3 F (36.8 C)   TempSrc:      Resp:  18 18   Height:      Weight: 59.9 kg (132 lb 0.9 oz)     SpO2:  93% 98% 97%   Weight change: 1.6 kg (3 lb 8.4 oz)  Intake/Output Summary (Last 24 hours) at 09/19/11 1455 Last data filed at 09/19/11 1000  Gross per 24 hour  Intake    582 ml  Output   2451 ml  Net  -1869 ml   Physical Exam: General: Awake, Oriented, No acute distress. HEENT: EOMI. Neck: Supple CV: S1 and S2 Lungs: Scattered expiratory wheezing bilaterally.  However the patient was getting her breathing treatment.  Moderate air movement. Abdomen: Soft, Nontender, Nondistended, +bowel sounds. Ext: Good pulses. Trace edema.  Lab Results:  [facility] 09/19/11 1145 09/17/11 0528  NA 130* 136  K 3.4* 3.4*  CL 91* 98  CO2 29 30  GLUCOSE 157* 98  BUN 18 11  CREATININE 0.68 0.65  CALCIUM 9.4 8.9  MG -- --  PHOS -- --   No results found for this basename: AST:2,ALT:2,ALKPHOS:2,BILITOT:2,PROT:2,ALBUMIN:2 in the last 72 hours No results found for this basename: LIPASE:2,AMYLASE:2 in the last 72 hours  Basename 09/19/11 1145 09/17/11 0528  WBC 15.3* 13.4*  NEUTROABS -- --  HGB 12.4 11.6*  HCT 37.7 36.6  MCV 82.1 84.5  PLT 215 146*    Basename 09/16/11 1600  CKTOTAL 143  CKMB 4.1*  CKMBINDEX --  TROPONINI <0.30    Basename 09/18/11 0630 09/17/11 1633  POCBNP 944.2* 734.8*   No results found for this basename: DDIMER:2 in the last 72 hours No results found for this basename: HGBA1C:2 in the last 72 hours No results found for this basename: CHOL:2,HDL:2,LDLCALC:2,TRIG:2,CHOLHDL:2,LDLDIRECT:2 in the last 72 hours No results found for this basename: TSH,T4TOTAL,FREET3,T3FREE,THYROIDAB in the last  72 hours No results found for this basename: VITAMINB12:2,FOLATE:2,FERRITIN:2,TIBC:2,IRON:2,RETICCTPCT:2 in the last 72 hours  Micro Results: Recent Results (from the past 240 hour(s))  CULTURE, SPUTUM-ASSESSMENT     Status: Normal   Collection Time   09/17/11  9:52 PM      Component Value Range Status Comment   Specimen Description SPUTUM   Final    Special Requests Normal   Final    Sputum evaluation     Final    Value: THIS SPECIMEN IS ACCEPTABLE. RESPIRATORY CULTURE REPORT TO FOLLOW.   Report Status 09/17/2011 FINAL   Final   CULTURE, RESPIRATORY     Status: Normal (Preliminary result)   Collection Time   09/17/11  9:52 PM      Component Value Range Status Comment   Specimen Description SPUTUM   Final    Special Requests NONE   Final    Gram Stain     Final    Value: FEW WBC PRESENT, PREDOMINANTLY PMN     RARE SQUAMOUS EPITHELIAL CELLS PRESENT     NO ORGANISMS SEEN   Culture NORMAL OROPHARYNGEAL FLORA   Final    Report Status PENDING   Incomplete     Studies/Results: Dg Chest Portable 1 View  09/17/2011  *RADIOLOGY REPORT*  Clinical Data: Short of breath  PORTABLE CHEST -  1 VIEW  Comparison: 09/15/2011  Findings: Cardiac enlargement.  Vascular congestion with possible interstitial edema has developed since the prior study.  Bibasilar atelectasis or infiltrate remains.  Right upper lobe irregular density located laterally is unchanged. This could be due to pneumonia or mass lesion.  If this does  not clear with medical treatment, CT chest is suggested to rule out underlying neoplasm.  Small left pleural effusion is present.  IMPRESSION: Increase in interstitial lung markings suggestive of interstitial edema compared with  the prior study.  Increase in bibasilar atelectasis/infiltrate.  Right upper lobe peripheral density is unchanged and may represent infiltrate or   neoplasm.  This is not identified on the study of 09/21/2010.  If this does not resolve with medical treatment, CT  chest is suggested to evaluate for underlying neoplasm.  Original Report Authenticated By: Camelia Phenes, M.D.   Dg Chest Portable 1 View  09/15/2011  *RADIOLOGY REPORT*  Clinical Data: Cough and shortness of breath.  History of COPD.  PORTABLE CHEST - 1 VIEW  Comparison: 09/21/2010  Findings: The patient is status post median sternotomy and CABG procedure. There is a right chest wall pacer device with leads in the right atrial appendage right ventricle.  Heart size is normal.  There is a peripheral opacity within the right midlung which appears new from previous exam.  A left pleural effusion is identified and there is airspace consolidation involving the left lower lobe.  Plate-like atelectasis is noted in the right base.  IMPRESSION:  1.  Right midlung and left lower lobe airspace opacities worrisome for multifocal pneumonia. 2.  Left effusion.  Original Report Authenticated By: Rosealee Albee, M.D.    Medications: I have reviewed the patient's current medications. Scheduled Meds:   . digoxin  125 mcg Oral Daily  . diltiazem  240 mg Oral Daily  . ezetimibe  10 mg Oral Daily  . furosemide  20 mg Intravenous Q12H  . guaiFENesin  1,200 mg Oral BID  . levalbuterol  0.63 mg Nebulization Q6H  . methylPREDNISolone (SOLU-MEDROL) injection  60 mg Intravenous Q8H  . mometasone-formoterol  2 puff Inhalation BID  . montelukast  5 mg Oral QHS  . pantoprazole  40 mg Oral Daily  . piperacillin-tazobactam (ZOSYN)  IV  3.375 g Intravenous Q8H  . potassium chloride SA  20 mEq Oral BID  . sertraline  50 mg Oral Daily  . sotalol  80 mg Oral BID  . tiotropium  18 mcg Inhalation Daily  . warfarin  0.5 mg Oral ONCE-1800  . DISCONTD: warfarin  0.5 mg Oral Custom  . DISCONTD: warfarin  1 mg Oral Custom   Continuous Infusions:  PRN Meds:.acetaminophen, acetaminophen, docusate sodium, levalbuterol, levalbuterol  Assessment/Plan:  1. COPD with exacerbation, with presumed to make for pneumonia.  Continue  IV Solu-Medrol.  Will transition the patient from Zosyn to Augmentin.  Patient has been on antibiotics for 4 days.  Appreciate pulmonary input.  Patient on chronic O2 at home.  2. Presumed diastolic congestive heart failure.  Continue IV Lasix twice daily.  Patient had a 2-D echocardiogram on 09/16/2011 it showed left ventricle cavity size was normal wall thickness was increased in the pattern of mild LVH.  Ejection fraction was 60%.  3. Atrial fibrillation.  Patient is rate controlled.  Continue chronic anticoagulation.  4. History of aortic valve replacement.  Continue chronic anticoagulation.  5. hypertension stable.  6. history of coronary artery disease stable.  7. Hyponatremia.  Monitor for  now.  8. Hypokalemia.  Replace as needed.  9. Leukocytosis.  Likely due to steroids.  10.  Prophylaxis.  Therapeutic INR.  11.  Disposition.  Pending.   LOS: 4 days  Shacarra Choe A, MD 09/19/2011, 2:55 PM

## 2011-09-20 LAB — BASIC METABOLIC PANEL
Calcium: 9.3 mg/dL (ref 8.4–10.5)
Creatinine, Ser: 0.73 mg/dL (ref 0.50–1.10)
GFR calc non Af Amer: 76 mL/min — ABNORMAL LOW (ref >90)
Sodium: 138 mEq/L (ref 135–145)

## 2011-09-20 LAB — CBC
MCH: 26.7 pg (ref 26.0–34.0)
MCHC: 32 g/dL (ref 30.0–36.0)
MCV: 83.4 fL (ref 78.0–100.0)
Platelets: 207 10*3/uL (ref 150–400)
RDW: 16 % — ABNORMAL HIGH (ref 11.5–15.5)

## 2011-09-20 LAB — PROTIME-INR
INR: 2.81 — ABNORMAL HIGH (ref 0.00–1.49)
Prothrombin Time: 30 seconds — ABNORMAL HIGH (ref 11.6–15.2)

## 2011-09-20 LAB — CULTURE, RESPIRATORY W GRAM STAIN

## 2011-09-20 MED ORDER — METHYLPREDNISOLONE 4 MG PO KIT: 4.0000 mg | PACK | ORAL | Status: DC

## 2011-09-20 MED ORDER — METHYLPREDNISOLONE 4 MG PO KIT
4.0000 mg | PACK | Freq: Three times a day (TID) | ORAL | Status: AC
  Administered 2011-09-21 (×3): 4 mg via ORAL

## 2011-09-20 MED ORDER — WARFARIN 0.5 MG HALF TABLET
0.5000 mg | ORAL_TABLET | Freq: Once | ORAL | Status: AC
  Administered 2011-09-20: 0.5 mg via ORAL
  Filled 2011-09-20: qty 1

## 2011-09-20 MED ORDER — FUROSEMIDE 20 MG PO TABS
20.0000 mg | ORAL_TABLET | Freq: Two times a day (BID) | ORAL | Status: DC
  Administered 2011-09-20 – 2011-09-21 (×3): 20 mg via ORAL
  Filled 2011-09-20 (×4): qty 1

## 2011-09-20 MED ORDER — METHYLPREDNISOLONE 4 MG PO KIT
8.0000 mg | PACK | Freq: Every morning | ORAL | Status: AC
  Administered 2011-09-20: 8 mg via ORAL
  Filled 2011-09-20: qty 21

## 2011-09-20 MED ORDER — METHYLPREDNISOLONE SODIUM SUCC 125 MG IJ SOLR: 60.0000 mg | Freq: Three times a day (TID) | INTRAMUSCULAR | Status: DC

## 2011-09-20 MED ORDER — METHYLPREDNISOLONE 4 MG PO KIT
4.0000 mg | PACK | ORAL | Status: DC
  Administered 2011-09-20: 4 mg via ORAL

## 2011-09-20 MED ORDER — METHYLPREDNISOLONE 4 MG PO KIT: 8.0000 mg | PACK | Freq: Every evening | ORAL | Status: DC

## 2011-09-20 MED ORDER — METHYLPREDNISOLONE 4 MG PO KIT
8.0000 mg | PACK | Freq: Every evening | ORAL | Status: AC
  Administered 2011-09-20: 8 mg via ORAL

## 2011-09-20 MED ORDER — METHYLPREDNISOLONE 4 MG PO KIT: 4.0000 mg | PACK | Freq: Four times a day (QID) | ORAL | Status: DC

## 2011-09-20 MED ORDER — METHYLPREDNISOLONE 4 MG PO KIT
4.0000 mg | PACK | ORAL | Status: AC
  Administered 2011-09-20: 4 mg via ORAL

## 2011-09-20 MED ORDER — METHYLPREDNISOLONE 4 MG PO TABS
4.0000 mg | ORAL_TABLET | Freq: Once | ORAL | Status: DC
  Filled 2011-09-20: qty 1

## 2011-09-20 NOTE — Progress Notes (Signed)
Subjective: Continues to be short of breath but is improved from admission.  Still noting intermittent wheezing.  Objective: Vital signs in last 24 hours: Filed Vitals:   09/20/11 0914 09/20/11 1005 09/20/11 1142 09/20/11 1143  BP:   124/68   Pulse:    70  Temp:      TempSrc:      Resp:      Height:      Weight:      SpO2: 94% 95%     Weight change: -1.2 kg (-2 lb 10.3 oz)  Intake/Output Summary (Last 24 hours) at 09/20/11 1511 Last data filed at 09/20/11 1300  Gross per 24 hour  Intake   1097 ml  Output   2701 ml  Net  -1604 ml   Physical Exam: General: Awake, Oriented, No acute distress. HEENT: EOMI. Neck: Supple CV: S1 and S2 Lungs: Scattered expiratory wheezing bilaterally.  However the patient was getting her breathing treatment.  Moderate air movement. Abdomen: Soft, Nontender, Nondistended, +bowel sounds. Ext: Good pulses. Trace edema.  Lab Results:  [facility] 09/20/11 0535 09/19/11 1145  NA 138 130*  K 3.5 3.4*  CL 96 91*  CO2 32 29  GLUCOSE 162* 157*  BUN 25* 18  CREATININE 0.73 0.68  CALCIUM 9.3 9.4  MG -- --  PHOS -- --   No results found for this basename: AST:2,ALT:2,ALKPHOS:2,BILITOT:2,PROT:2,ALBUMIN:2 in the last 72 hours No results found for this basename: LIPASE:2,AMYLASE:2 in the last 72 hours  Basename 09/20/11 0535 09/19/11 1145  WBC 13.4* 15.3*  NEUTROABS -- --  HGB 12.1 12.4  HCT 37.8 37.7  MCV 83.4 82.1  PLT 207 215   No results found for this basename: CKTOTAL:3,CKMB:3,CKMBINDEX:3,TROPONINI:3 in the last 72 hours  Basename 09/18/11 0630 09/17/11 1633  POCBNP 944.2* 734.8*   No results found for this basename: DDIMER:2 in the last 72 hours No results found for this basename: HGBA1C:2 in the last 72 hours No results found for this basename: CHOL:2,HDL:2,LDLCALC:2,TRIG:2,CHOLHDL:2,LDLDIRECT:2 in the last 72 hours No results found for this basename: TSH,T4TOTAL,FREET3,T3FREE,THYROIDAB in the last 72 hours No results found for this  basename: VITAMINB12:2,FOLATE:2,FERRITIN:2,TIBC:2,IRON:2,RETICCTPCT:2 in the last 72 hours  Micro Results: Recent Results (from the past 240 hour(s))  CULTURE, SPUTUM-ASSESSMENT     Status: Normal   Collection Time   09/17/11  9:52 PM      Component Value Range Status Comment   Specimen Description SPUTUM   Final    Special Requests Normal   Final    Sputum evaluation     Final    Value: THIS SPECIMEN IS ACCEPTABLE. RESPIRATORY CULTURE REPORT TO FOLLOW.   Report Status 09/17/2011 FINAL   Final   CULTURE, RESPIRATORY     Status: Normal   Collection Time   09/17/11  9:52 PM      Component Value Range Status Comment   Specimen Description SPUTUM   Final    Special Requests NONE   Final    Gram Stain     Final    Value: FEW WBC PRESENT, PREDOMINANTLY PMN     RARE SQUAMOUS EPITHELIAL CELLS PRESENT     NO ORGANISMS SEEN   Culture NORMAL OROPHARYNGEAL FLORA   Final    Report Status 09/20/2011 FINAL   Final     Studies/Results: Dg Chest Portable 1 View  09/17/2011  *RADIOLOGY REPORT*  Clinical Data: Short of breath  PORTABLE CHEST - 1 VIEW  Comparison: 09/15/2011  Findings: Cardiac enlargement.  Vascular congestion with possible interstitial  edema has developed since the prior study.  Bibasilar atelectasis or infiltrate remains.  Right upper lobe irregular density located laterally is unchanged. This could be due to pneumonia or mass lesion.  If this does  not clear with medical treatment, CT chest is suggested to rule out underlying neoplasm.  Small left pleural effusion is present.  IMPRESSION: Increase in interstitial lung markings suggestive of interstitial edema compared with  the prior study.  Increase in bibasilar atelectasis/infiltrate.  Right upper lobe peripheral density is unchanged and may represent infiltrate or   neoplasm.  This is not identified on the study of 09/21/2010.  If this does not resolve with medical treatment, CT chest is suggested to evaluate for underlying neoplasm.   Original Report Authenticated By: Camelia Phenes, M.D.   Dg Chest Portable 1 View  09/15/2011  *RADIOLOGY REPORT*  Clinical Data: Cough and shortness of breath.  History of COPD.  PORTABLE CHEST - 1 VIEW  Comparison: 09/21/2010  Findings: The patient is status post median sternotomy and CABG procedure. There is a right chest wall pacer device with leads in the right atrial appendage right ventricle.  Heart size is normal.  There is a peripheral opacity within the right midlung which appears new from previous exam.  A left pleural effusion is identified and there is airspace consolidation involving the left lower lobe.  Plate-like atelectasis is noted in the right base.  IMPRESSION:  1.  Right midlung and left lower lobe airspace opacities worrisome for multifocal pneumonia. 2.  Left effusion.  Original Report Authenticated By: Rosealee Albee, M.D.    Medications: I have reviewed the patient's current medications. Scheduled Meds:    . amoxicillin-clavulanate  1 tablet Oral Q12H  . digoxin  125 mcg Oral Daily  . diltiazem  240 mg Oral Daily  . ezetimibe  10 mg Oral Daily  . furosemide  20 mg Intravenous Q12H  . guaiFENesin  1,200 mg Oral BID  . levalbuterol  0.63 mg Nebulization Q6H  . methylPREDNISolone (SOLU-MEDROL) injection  60 mg Intravenous Q8H  . mometasone-formoterol  2 puff Inhalation BID  . montelukast  5 mg Oral QHS  . pantoprazole  40 mg Oral Daily  . potassium chloride SA  20 mEq Oral BID  . sertraline  50 mg Oral Daily  . sotalol  80 mg Oral BID  . tiotropium  18 mcg Inhalation Daily  . warfarin  0.5 mg Oral ONCE-1800  . warfarin  0.5 mg Oral ONCE-1800   Continuous Infusions:  PRN Meds:.acetaminophen, acetaminophen, docusate sodium, levalbuterol, levalbuterol  Assessment/Plan: 1. COPD with exacerbation, with presumed to make for pneumonia.  Continue Solu-Medrol change IV to PO.  Continue Augmentin.  Patient has been on antibiotics for 5 days.  Appreciate pulmonary  input.  Patient on chronic O2 at home.  2. Presumed diastolic congestive heart failure.  Change IV Lasix to PO twice daily.  Patient had a 2-D echocardiogram on 09/16/2011 it showed left ventricle cavity size was normal wall thickness was increased in the pattern of mild LVH.  Ejection fraction was 60%.  3. Atrial fibrillation.  Patient is rate controlled.  Continue chronic anticoagulation. INR therapeutic.  4. History of aortic valve replacement.  Continue chronic anticoagulation.  5. Hypertension stable.  6. history of coronary artery disease stable.  7. Hyponatremia.  Monitor for now.  8. Hypokalemia.  Resolved with replacement.  9. Leukocytosis.  Likely due to steroids.  10.  Prophylaxis.  Therapeutic INR.  11.  Disposition.  Pending. DC in 1-2 days?   LOS: 5 days  Rontavious Albright A, MD 09/20/2011, 3:11 PM

## 2011-09-20 NOTE — Progress Notes (Signed)
ANTICOAGULATION CONSULT NOTE - Follow Up Consult  Pharmacy Consult for Coumadin Indication: atrial fibrillation  Allergies  Allergen Reactions  . Atorvastatin     Forgetfulness   . Cephalexin     Unsure as to reaction. "I can take Penicillin"  . Codeine Nausea And Vomiting  . Crestor (Rosuvastatin Calcium)     Forgetfulness    . Estradiol     Unknown reaction  . Hydrocortisone Hives  . Mevacor Other (See Comments)    forgetfullness,  . Moxifloxacin     Unknown reaction  . Sulfonamide Derivatives     itching    Patient Measurements: Height: 5' 2.5" (158.8 cm) Weight: 129 lb 6.6 oz (58.7 kg) IBW/kg (Calculated) : 51.25  Adjusted Body Weight:   Vital Signs: Temp: 97.6 F (36.4 C) (11/15 0606) BP: 142/71 mmHg (11/15 0606) Pulse Rate: 61  (11/15 0606)  Labs:  Basename 09/20/11 0535 09/19/11 1145 09/19/11 0500 09/18/11 0630  HGB 12.1 12.4 -- --  HCT 37.8 37.7 -- --  PLT 207 215 -- --  APTT -- -- -- --  LABPROT 30.0* -- 30.3* 28.2*  INR 2.81* -- 2.84* 2.59*  HEPARINUNFRC -- -- -- --  CREATININE 0.73 0.68 -- --  CKTOTAL -- -- -- --  CKMB -- -- -- --  TROPONINI -- -- -- --   Estimated Creatinine Clearance: 41.6 ml/min (by C-G formula based on Cr of 0.73).  Assessment: 77 yo F on Coumadin for Afib. INR is at-goal.  Home Coumadin regimen = 1mg  daily except 0.5mg  on Tue/Thur.  Goal of Therapy:  INR 2-3   Plan:  1. Coumadin 0.5mg  po today. 2. Follow-up AM INR   Emeline Gins [phone] 09/20/2011,9:38 AM

## 2011-09-20 NOTE — Progress Notes (Signed)
Physical Therapy Evaluation Patient Details Name: DEAJAH ERKKILA MRN: [id number] DOB: 31-Aug-1924 Today's Date: 09/20/2011  Problem List:  Patient Active Problem List  Diagnoses  . UNSPECIFIED ANEMIA  . Atrial fibrillation  . ASTHMATIC BRONCHITIS, ACUTE  . CHRONIC OBSTRUCTIVE ASTHMA UNSPECIFIED  . GASTROENTERITIS  . CONSTIPATION  . IBS  . CELLULITIS  . PRURITIC DISORDER NOS  . OCCIPITAL NEURALGIA  . OSTEOPOROSIS  . ORTHOSTATIC DIZZINESS  . SLEEP APNEA, MILD  . FATIGUE, CHRONIC  . DYSPNEA  . HEMOPTYSIS UNSPECIFIED  . FLATULENCE-GAS-BLOATING  . POSTURAL HYPOTENSION  . OTHER SPECIFIED DISEASE OF NAIL  . PARESTHESIA, HANDS  . ABDOMINAL PAIN, EPIGASTRIC  . Allergic rhinitis due to pollen  . Atypical chest pain  . Aortic stenosis  . Pacemaker  . Chronic anticoagulation  . COPD with exacerbation  . Pneumonia    Past Medical History:  Past Medical History  Diagnosis Date  . Asthmatic bronchitis   . Allergic rhinitis   . Atrial fibrillation   . History of aortic valve replacement Dr. Tyrone Sage 2006    Has severe residual gradient and will be managed conservatively  . Sleep apnea   . Chronic obstructive asthma   . Osteoporosis   . Pruritic disorder   . Diverticulosis   . Hemorrhoids   . GERD (gastroesophageal reflux disease)   . IBS (irritable bowel syndrome)   . Pacemaker 2010  . Oxygen dependent   . Atrial flutter     on Betapace  . High risk medication use     on Betapace & Coumadin  . Chronic anticoagulation   . COPD (chronic obstructive pulmonary disease)   . Pneumonia   . Anemia   . Arthritis   . Hypertension   . Coronary artery disease    Past Surgical History:  Past Surgical History  Procedure Date  . Back surgery 1994  . Tonsillectomy   . Appendectomy   . Ganglion cyst excision   . Vocal cord polyps 1975 and 1984  . Aortic valve replacement 2006  . Pacemaker insertion 2010  . Cardiac electrophysiology mapping and ablation   . Coronary  artery bypass graft   . Insert / replace / remove pacemaker   . Eye surgery   . Cardiac catheterization   . Coronary angioplasty   . Cardiac valve replacement     PT Assessment/Plan/Recommendation PT Assessment Clinical Impression Statement: Pt would benefit from PT while in the acute setting to maximize independence and increase functional mobility to prepare for safe d/c to next venue. PT Recommendation/Assessment: Patient will need skilled PT in the acute care venue PT Problem List: Decreased strength;Decreased activity tolerance;Decreased balance;Decreased mobility;Decreased knowledge of use of DME;Cardiopulmonary status limiting activity Barriers to Discharge: None Barriers to Discharge Comments: Pt reports wanting to go home and maintain independence.  Pt's daughter has offered to let her live at her house, but pt remains undecided.  Home environment information is for the pt's home, not daughter's, but pt reports she would be staying on the first floor at her daughter's as well. PT Therapy Diagnosis : Difficulty walking;Abnormality of gait;Generalized weakness PT Plan PT Frequency: Min 3X/week PT Treatment/Interventions: DME instruction;Gait training;Stair training;Functional mobility training;Balance training;Patient/family education PT Recommendation Follow Up Recommendations: Home health PT;24 hour supervision/assistance Equipment Recommended: Defer to next venue  PT educated pt on benefits of staying with someone upon d/c, but pt is still undecided and reports she wants to maintain her independence.   PT Goals  Acute Rehab PT Goals PT Goal  Formulation: With patient Time For Goal Achievement: 7 days Pt will go Supine/Side to Sit: with modified independence PT Goal: Supine/Side to Sit - Progress: Not met Pt will Transfer Sit to Stand/Stand to Sit: with modified independence PT Transfer Goal: Sit to Stand/Stand to Sit - Progress: Not met Pt will Ambulate: >150 feet;with  modified independence;with least restrictive assistive device PT Goal: Ambulate - Progress: Not met Pt will Go Up / Down Stairs: 3-5 stairs;with modified independence;with least restrictive assistive device;with rail(s) PT Goal: Up/Down Stairs - Progress: Not met  PT Evaluation Precautions/Restrictions  Precautions Precautions: Fall Required Braces or Orthoses: No Restrictions Weight Bearing Restrictions: No Prior Functioning  Home Living Lives With: Alone (But may live with daughter for awhile upon d/c) Receives Help From: Family;Other (Comment) (Someone comes to help her clean every other week) Type of Home: House Home Layout: One level Home Access: Stairs to enter Entrance Stairs-Rails: Right Entrance Stairs-Number of Steps: 4 (4 stairs in the front, 1 in the back of the house) Bathroom Shower/Tub: Engineer, manufacturing systems: Standard Bathroom Accessibility: Yes Home Adaptive Equipment: Grab bars in shower;Shower chair with back;Walker - rolling Prior Function Level of Independence: Needs assistance with ADLs;Needs assistance with homemaking;Needs assistance with tranfers;Needs assistance with gait Bath: Other (comment) (On "good" days she is mod-I c bathing, other days needs A) Grooming: Other (comment) (Depends on the day) Meal Prep: Other (comment) (Usually has people take her out to eat, cooks for self some) Laundry: Supervision/set-up (Pt reports using RW to transport clothes with) Vacuuming: Total (She has someone to come help her clean) Light Housekeeping: Total (She has someone to come help her clean) Shopping: Total (Her granddaughter used to go to the store for her) Able to Take Stairs?: Yes Driving: Yes (Only on days she feels well) Vocation: Unemployed Leisure: Hobbies-yes (Comment) Comments: caring for her dog Cognition Cognition Arousal/Alertness: Awake/alert Overall Cognitive Status: Appears within functional limits for tasks assessed Orientation  Level: Oriented X4 Sensation/Coordination   Extremity Assessment RUE Assessment RUE Assessment: Within Functional Limits LUE Assessment LUE Assessment: Within Functional Limits RLE Assessment RLE Assessment: Within Functional Limits LLE Assessment LLE Assessment: Within Functional Limits Mobility (including Balance) Bed Mobility Bed Mobility: Yes Supine to Sit: 5: Supervision;HOB elevated (Comment degrees) Supine to Sit Details (indicate cue type and reason): Supervision for pt safety.  Verbal cues to initiate. Sitting - Scoot to Edge of Bed: 5: Supervision;With rail Sitting - Scoot to Edge of Bed Details (indicate cue type and reason): Supervision for pt safety.  Verbal directions to perform. Transfers Transfers: Yes Sit to Stand: From elevated surface;With upper extremity assist;From bed;Other (comment) (Minguard A) Sit to Stand Details (indicate cue type and reason): Minguard A for pt safety. Stand to Sit: 4: Min assist;With upper extremity assist;With armrests;To chair/3-in-1 Stand to Sit Details: A for controlled descent to chair and pt safety.  Verbal cues for hand placement on armrests prior to initiating sit. Ambulation/Gait Ambulation/Gait: Yes Ambulation/Gait Assistance: 4: Min assist Ambulation/Gait Assistance Details (indicate cue type and reason): Min A to maintain balance.  Pt also held onto side-rail in hallway.  A to maneuver O2. Ambulation Distance (Feet): 60 Feet Assistive device: Other (Comment) (Side rail in hallway.  R side.) Gait Pattern: Shuffle;Decreased stride length Gait velocity: Decreased, but not measured. Stairs: No Wheelchair Mobility Wheelchair Mobility: No  Posture/Postural Control Posture/Postural Control: No significant limitations Balance Balance Assessed: No  Vitals: O2 Sats:  95% on 3 L O2, taken approximately 5 min after ambulation.  Exercise    End of Session PT - End of Session Equipment Utilized During Treatment: Gait belt;Other  (comment) (O2) Activity Tolerance: Patient tolerated treatment well (But slightly limited by generalized weakness and fatigue.) Patient left: in chair;with call bell in reach Nurse Communication: Mobility status for ambulation;Mobility status for transfers General Behavior During Session: [facility] for tasks performed Cognition: [facility] for tasks performed  Lacinda Axon 09/20/2011, 11:46 AM  Yosgart Pavey, [place], PT [phone]

## 2011-09-20 NOTE — Progress Notes (Signed)
1900 Nursing 09/20/11 MD notified about previous dosage of IV injection of  Methylprednisolone and the 3 tabs of methylprednisolone given at 1800. MD still wants patient to receive 3 tabs of  Methylprednisolone which equals 12mg  before bedtime.  Ernesta Amble, RN

## 2011-09-21 LAB — PROTIME-INR
INR: 3.24 — ABNORMAL HIGH (ref 0.00–1.49)
Prothrombin Time: 33.6 seconds — ABNORMAL HIGH (ref 11.6–15.2)

## 2011-09-21 MED ORDER — METHYLPREDNISOLONE 4 MG PO TABS: 4.0000 mg | ORAL_TABLET | Freq: Every day | ORAL | Status: DC

## 2011-09-21 MED ORDER — AMOXICILLIN-POT CLAVULANATE 875-125 MG PO TABS: 1.0000 | ORAL_TABLET | Freq: Two times a day (BID) | ORAL | Status: DC

## 2011-09-21 MED ORDER — METHYLPREDNISOLONE (PAK) 4 MG PO TABS: ORAL_TABLET | ORAL | Status: DC

## 2011-09-21 MED ORDER — LEVALBUTEROL HCL 0.63 MG/3ML IN NEBU: 0.6300 mg | INHALATION_SOLUTION | RESPIRATORY_TRACT | Status: DC | PRN

## 2011-09-21 NOTE — Progress Notes (Signed)
Pt chose bed at [place] [facility] [place] and bed available today. Clinical Social Worker facilitated pt D/C needs including contacting facility, family, and arranging ambulance transportation to [place] at [place]. No further social work needs at this time.  Jacklynn Lewis, MSW, LCSWA  Clinical Social Work [phone]

## 2011-09-21 NOTE — Progress Notes (Signed)
Clinical Social Worker completed psychosocial assessment and initiated SNF search. Assessment and placement note can be found in shadow chart. Clinical Social Worker to follow-up with pt in regard to bed offers. Clinical Social Worker to facilitate pt D/C needs.  Jacklynn Lewis, MSW, LCSWA  Clinical Social Work [phone]

## 2011-09-21 NOTE — Progress Notes (Signed)
Clinical Social Worker provided pt with bed offers. Pt interested in multiple facilities and CSW to check with facilities about bed offers and availability. Clinical Social Worker to follow up with pt in regard to decision about SNF placement. CSW to facilitate pt D/C needs.  Jacklynn Lewis, MSW, LCSWA  Clinical Social Work [phone]

## 2011-09-21 NOTE — Progress Notes (Signed)
Physical Therapy Treatment Patient Details Name: [name] MRN: [id number] DOB: May 27, 1924 Today's Date: 09/21/2011  PT Assessment/Plan  PT - Assessment/Plan Comments on Treatment Session: Tx session focued on increasing activity tolerance and strengthening as well as safely progressing gait with RW instruction.  PT Plan: Other (comment) (Pt reports that she will be going to SNF for short stay at [facility]) PT Frequency: Min 3X/week Follow Up Recommendations: Skilled nursing facility Equipment Recommended: Defer to next venue PT Goals  Acute Rehab PT Goals PT Goal: Supine/Side to Sit - Progress: Not met PT Transfer Goal: Sit to Stand/Stand to Sit - Progress: Progressing toward goal PT Goal: Ambulate - Progress: Progressing toward goal PT Goal: Up/Down Stairs - Progress: Not met  PT Treatment Precautions/Restrictions  Precautions Precautions: Fall Required Braces or Orthoses: No Restrictions Weight Bearing Restrictions: No Mobility (including Balance) Bed Mobility Bed Mobility: No Transfers Transfers: Yes Sit to Stand: 5: Supervision Sit to Stand Details (indicate cue type and reason): Cues for hand placement Stand to Sit: 5: Supervision Stand to Sit Details: Cues for hand placement to control decent Ambulation/Gait Ambulation/Gait: Yes Ambulation/Gait Assistance: 5: Supervision Ambulation/Gait Assistance Details (indicate cue type and reason): Pt required encouragement to use RW and was educated on importance for safety regarding falls risk and energy conservation. Pt given cues for posture and breathing.  Ambulation Distance (Feet): 60 Feet Assistive device: Rolling walker Gait Pattern: Decreased stride length;Shuffle;Trunk flexed Stairs: No Wheelchair Mobility Wheelchair Mobility: No  Posture/Postural Control Posture/Postural Control: No significant limitations Balance Balance Assessed: No Exercise  General Exercises - Lower Extremity Ankle Circles/Pumps:  AROM;Strengthening;Both;10 reps;Seated Long Arc Quad: AROM;Strengthening;Both;15 reps;Seated Hip Flexion/Marching: AROM;Strengthening;Both;10 reps;Seated End of Session PT - End of Session Equipment Utilized During Treatment: Gait belt Activity Tolerance: Patient limited by fatigue Patient left: in chair;with call bell in reach General Behavior During Session: [facility] for tasks performed Cognition: [facility] for tasks performed  [facility] [place], [place] [phone]  09/21/2011, 1:13 PM

## 2011-09-21 NOTE — Progress Notes (Signed)
ANTICOAGULATION CONSULT NOTE - Follow Up Consult  Pharmacy Consult for Coumadin Indication: atrial fibrillation  Allergies  Allergen Reactions  . Atorvastatin     Forgetfulness   . Cephalexin     Unsure as to reaction. "I can take Penicillin"  . Codeine Nausea And Vomiting  . Crestor (Rosuvastatin Calcium)     Forgetfulness    . Estradiol     Unknown reaction  . Hydrocortisone Hives  . Mevacor Other (See Comments)    forgetfullness,  . Moxifloxacin     Unknown reaction  . Sulfonamide Derivatives     itching    Patient Measurements: Height: 5' 2.5" (158.8 cm) Weight: 129 lb 6.6 oz (58.7 kg) IBW/kg (Calculated) : 51.25   Vital Signs: Temp: 98.4 F (36.9 C) (11/16 0451) BP: 128/64 mmHg (11/16 0954) Pulse Rate: 63  (11/16 0953)  Labs:  Basename 09/21/11 0520 09/20/11 0535 09/19/11 1145 09/19/11 0500  HGB -- 12.1 12.4 --  HCT -- 37.8 37.7 --  PLT -- 207 215 --  APTT -- -- -- --  LABPROT 33.6* 30.0* -- 30.3*  INR 3.24* 2.81* -- 2.84*  HEPARINUNFRC -- -- -- --  CREATININE -- 0.73 0.68 --  CKTOTAL -- -- -- --  CKMB -- -- -- --  TROPONINI -- -- -- --   Estimated Creatinine Clearance: 41.6 ml/min (by C-G formula based on Cr of 0.73).   Medications:  Scheduled:    . amoxicillin-clavulanate  1 tablet Oral Q12H  . digoxin  125 mcg Oral Daily  . diltiazem  240 mg Oral Daily  . ezetimibe  10 mg Oral Daily  . furosemide  20 mg Oral BID  . guaiFENesin  1,200 mg Oral BID  . levalbuterol  0.63 mg Nebulization Q6H  . methylPREDNISolone  4 mg Oral 3 x daily with food  . methylPREDNISolone  4 mg Oral 4X daily taper  . methylPREDNISolone  4 mg Oral PC supper  . methylPREDNISolone  8 mg Oral AC breakfast  . methylPREDNISolone  8 mg Oral Nightly  . methylPREDNISolone  8 mg Oral Nightly  . methylPREDNISolone  4 mg Oral Once  . mometasone-formoterol  2 puff Inhalation BID  . montelukast  5 mg Oral QHS  . pantoprazole  40 mg Oral Daily  . potassium chloride SA  20 mEq  Oral BID  . sertraline  50 mg Oral Daily  . sotalol  80 mg Oral BID  . tiotropium  18 mcg Inhalation Daily  . warfarin  0.5 mg Oral ONCE-1800  . DISCONTD: furosemide  20 mg Intravenous Q12H  . DISCONTD: methylPREDNISolone  4 mg Oral PC lunch  . DISCONTD: methylPREDNISolone  4 mg Oral PC supper  . DISCONTD: methylPREDNISolone (SOLU-MEDROL) injection  60 mg Intravenous Q8H  . DISCONTD: methylPREDNISolone (SOLU-MEDROL) injection  60 mg Intravenous Q8H    Assessment: 77 year old female on Coumadin PTA, INR today is 3.24, no bleeding noted  Goal of Therapy:  INR 2-3   Plan:  1) No Coumadin today 2) Continue daily PT / INR  Elwin Sleight 09/21/2011,10:22 AM

## 2011-09-21 NOTE — Discharge Summary (Addendum)
Discharge Summary  [name] MR#: [id number]  DOB:04/09/1924  Date of Admission: 09/15/2011 Date of Discharge: 09/21/2011  Patient's PCP: [name], MD, MD  Attending Physician:Briselda Naval A  Consults: Dr. Maple Hudson, Pulmonary  Discharge Diagnoses: 1. COPD with exacerbation (Acute on chronic Respiratory failure (on chronic O2)), with presumed co pneumonia. 2. Presumed acute diastolic congestive heart failure.  3. Atrial fibrillation. 4. History of aortic valve replacement. 5. Hypertension.  6. History of coronary artery disease.  7. Hyponatremia. 8. Hypokalemia.  9. Leukocytosis.  Brief Admitting History and Physical "77 year old female who came to the hospital after she had episode of shortness of breath last night. It is is that she was sleeping in the bed when the pain in her left hip woke up and she found that she was extremely short of breath. She also had a coughing spell and and was coughing up green-colored phlegm. As per patient she also noticed some blood in the phlegm. She also describes some chest pain though patient feels much better at this time. Patient has a history of COPD she is a smoker smoked for 50 years. Quit smoking in 1995. Chest x-ray done in the ER showed pneumonia."  Discharge Medications Current Discharge Medication List    START taking these medications   Details  amoxicillin-clavulanate (AUGMENTIN) 875-125 MG per tablet Take 1 tablet by mouth every 12 (twelve) hours. Take until 09/25/2011.    !! levalbuterol (XOPENEX) 0.63 MG/3ML nebulizer solution Take 3 mLs (0.63 mg total) by nebulization every 2 (two) hours as needed for wheezing or shortness of breath. Qty: 3 mL    methylPREDNIsolone (MEDROL DOSPACK) 4 MG tablet follow package directions Qty: 21 tablet, Refills: 0     !! - Potential duplicate medications found. Please discuss with provider.    CONTINUE these medications which have CHANGED   Details  methylPREDNISolone (MEDROL) 4  MG tablet Take 1 tablet (4 mg total) by mouth daily. Resume after completing the dose pack.      CONTINUE these medications which have NOT CHANGED   Details  digoxin (LANOXIN) 0.125 MG tablet Take 1 tablet (125 mcg total) by mouth daily. Qty: 30 tablet, Refills: 6    diltiazem (CARDIZEM CD) 240 MG 24 hr capsule Take 1 capsule (240 mg total) by mouth daily. Qty: 30 capsule, Refills: 6    diphenhydrAMINE (CHILDRENS ALLERGY) 12.5 MG/5ML liquid Take 12.5 mg by mouth at bedtime.     Docusate Calcium (STOOL SOFTENER PO) Take 100 mg by mouth daily.     DULERA 200-5 MCG/ACT AERO USE 2 PUFFS TWO TIMES A DAY AS NEEDED Qty: 1 Inhaler, Refills: 6    ezetimibe (ZETIA) 10 MG tablet Take 1 tablet (10 mg total) by mouth daily. Qty: 30 tablet, Refills: 5    furosemide (LASIX) 40 MG tablet Take 20 mg by mouth 2 (two) times daily.      Guaifenesin (MUCINEX MAXIMUM STRENGTH) 1200 MG TB12 Take 1,200 mg by mouth 2 (two) times daily at 10 AM and 5 PM.      IRON PO Take 1 tablet by mouth daily.      !! levalbuterol (XOPENEX) 0.63 MG/3ML nebulizer solution Take 1 ampule by nebulization 3 (three) times daily.      montelukast (SINGULAIR) 10 MG tablet TAKE 1 TABLET DAILY FOR ASTHMA Qty: 30 tablet, Refills: 2    NEXIUM 40 MG capsule TAKE 1 TABLET BY MOUTH ONCE A DAY Qty: 90 capsule, Refills: 0    potassium chloride SA (K-DUR,KLOR-CON) 20  MEQ tablet Take 20 mEq by mouth 2 (two) times daily.      sertraline (ZOLOFT) 50 MG tablet Take 50 mg by mouth daily.     sotalol (BETAPACE) 80 MG tablet Take 80 mg by mouth 2 (two) times daily.      SPIRIVA HANDIHALER 18 MCG inhalation capsule INHALE CONTENTS OF 1 CAPSULE ONCE A DAY Qty: 30 each, Refills: 5    warfarin (COUMADIN) 1 MG tablet Take 0.5-1 mg by mouth daily. Take 0.5 tablet on Tuesday & Thursday Take 1 tablet on all other days    Alum & Mag Hydroxide-Simeth (MAGIC MOUTHWASH) SOLN Take 5 mLs by mouth 4 (four) times daily as needed. For mouth  irritations     levalbuterol (XOPENEX HFA) 45 MCG/ACT inhaler Inhale 2 puffs into the lungs every 6 (six) hours as needed. For shortness of breath    levalbuterol (XOPENEX) 1.25 MG/3ML nebulizer solution Take 1 ampule by nebulization 3 (three) times daily. DX:  493.20     Omega-3 Fatty Acids (FISH OIL PO) Take 1 capsule by mouth 2 (two) times daily.      sodium chloride ([employer] SALINE NASAL SPRAY) 0.65 % nasal spray Place 1 spray into the nose as needed. For nose bleeds     !! - Potential duplicate medications found. Please discuss with provider.    STOP taking these medications     potassium chloride (KLOR-CON) 20 MEQ packet Comments:  Reason for Stopping:       Spacer/Aero-Holding Chambers (AEROCHAMBER Z-STAT PLUS CHAMBR) MISC Comments:  Reason for Stopping:       Multiple Minerals-Vitamins (CALCIUM CITRATE +) TABS Comments:  Reason for Stopping:       Multiple Vitamin (MULTIVITAMINS PO) Comments:  Reason for Stopping:       OXYGEN-HELIUM IN Comments:  Reason for Stopping:       vitamin B-12 (CYANOCOBALAMIN) 100 MCG tablet Comments:  Reason for Stopping:          Hospital Course: 1. COPD with exacerbation (Acute on chronic Respiratory failure (on chronic O2)), with presumed co pneumonia. Improved. Continue Solu-Medrol change IV to PO.  Initially was placed on Unasyn which was transitioned to Augmentin, Abx since 09/15/2011.  Will continue antibiotics until 09/25/2011 to complete ten-day course of antibiotics.  Dr. Maple Hudson from pulmonary evaluated the patient, and recommended appropriate changes for COPD management.  Patient to followup with Dr. Maple Hudson her pulmonologist as outpatient.   2. Acute diastolic congestive heart failure. Patient had a 2-D echocardiogram on 09/16/2011 it showed left ventricle cavity size was normal wall thickness was increased in the pattern of mild LVH. Ejection fraction was 60%.  Initially was placed on IV Lasix which was transitioned to oral as her  shortness of breath improved.  3. Atrial fibrillation. Patient was rate controlled. Continue chronic anticoagulation. INR therapeutic.   4. History of aortic valve replacement. Continue chronic anticoagulation.   5. Hypertension stable.   6. History of coronary artery disease stable.   7. Hyponatremia.  Resolved.   8. Hypokalemia. Resolved with replacement.   9. Leukocytosis. Likely due to steroids and #1.  10.  Generalized weakness.  Had PT/OT evaluate the patient, physical therapy initially recommended home health with 24-hour supervision.  However this cannot be arranged for the patient, she continued to be weak as a result agreed to go to skilled nursing facility for rehabilitation.   Day of Discharge BP 128/64  Pulse 60  Temp(Src) 98.4 F (36.9 C) (Oral)  Resp 24  Ht 5' 2.5" (1.588 m)  Wt 58.7 kg (129 lb 6.6 oz)  BMI 23.29 kg/m2  SpO2 94%  Results for orders placed during the hospital encounter of 09/15/11 (from the past 48 hour(s))  PROTIME-INR     Status: Abnormal   Collection Time   09/20/11  5:35 AM      Component Value Range Comment   Prothrombin Time 30.0 (*) 11.6 - 15.2 (seconds)    INR 2.81 (*) 0.00 - 1.49    CBC     Status: Abnormal   Collection Time   09/20/11  5:35 AM      Component Value Range Comment   WBC 13.4 (*) 4.0 - 10.5 (K/uL)    RBC 4.53  3.87 - 5.11 (MIL/uL)    Hemoglobin 12.1  12.0 - 15.0 (g/dL)    HCT 16.1  09.6 - 04.5 (%)    MCV 83.4  78.0 - 100.0 (fL)    MCH 26.7  26.0 - 34.0 (pg)    MCHC 32.0  30.0 - 36.0 (g/dL)    RDW 40.9 (*) 81.1 - 15.5 (%)    Platelets 207  150 - 400 (K/uL)   BASIC METABOLIC PANEL     Status: Abnormal   Collection Time   09/20/11  5:35 AM      Component Value Range Comment   Sodium 138  135 - 145 (mEq/L) DELTA CHECK NOTED   Potassium 3.5  3.5 - 5.1 (mEq/L)    Chloride 96  96 - 112 (mEq/L)    CO2 32  19 - 32 (mEq/L)    Glucose, Bld 162 (*) 70 - 99 (mg/dL)    BUN 25 (*) 6 - 23 (mg/dL)    Creatinine, Ser 9.14   0.50 - 1.10 (mg/dL)    Calcium 9.3  8.4 - 10.5 (mg/dL)    GFR calc non Af Amer 76 (*) >90 (mL/min)    GFR calc Af Amer 88 (*) >90 (mL/min)   PROTIME-INR     Status: Abnormal   Collection Time   09/21/11  5:20 AM      Component Value Range Comment   Prothrombin Time 33.6 (*) 11.6 - 15.2 (seconds)    INR 3.24 (*) 0.00 - 1.49      Dg Chest Portable 1 View  09/17/2011  *RADIOLOGY REPORT*  Clinical Data: Short of breath  PORTABLE CHEST - 1 VIEW  Comparison: 09/15/2011  Findings: Cardiac enlargement.  Vascular congestion with possible interstitial edema has developed since the prior study.  Bibasilar atelectasis or infiltrate remains.  Right upper lobe irregular density located laterally is unchanged. This could be due to pneumonia or mass lesion.  If this does  not clear with medical treatment, CT chest is suggested to rule out underlying neoplasm.  Small left pleural effusion is present.  IMPRESSION: Increase in interstitial lung markings suggestive of interstitial edema compared with  the prior study.  Increase in bibasilar atelectasis/infiltrate.  Right upper lobe peripheral density is unchanged and may represent infiltrate or   neoplasm.  This is not identified on the study of 09/21/2010.  If this does not resolve with medical treatment, CT chest is suggested to evaluate for underlying neoplasm.  Original Report Authenticated By: Camelia Phenes, M.D.   Dg Chest Portable 1 View  09/15/2011  *RADIOLOGY REPORT*  Clinical Data: Cough and shortness of breath.  History of COPD.  PORTABLE CHEST - 1 VIEW  Comparison: 09/21/2010  Findings: The patient is status post median sternotomy and  CABG procedure. There is a right chest wall pacer device with leads in the right atrial appendage right ventricle.  Heart size is normal.  There is a peripheral opacity within the right midlung which appears new from previous exam.  A left pleural effusion is identified and there is airspace consolidation involving the left  lower lobe.  Plate-like atelectasis is noted in the right base.  IMPRESSION:  1.  Right midlung and left lower lobe airspace opacities worrisome for multifocal pneumonia. 2.  Left effusion.  Original Report Authenticated By: Rosealee Albee, M.D.     Disposition: To skilled nursing facility.  Diet: Heart healthy.  Activity: Resume as tolerated as per physical therapy.   Follow-up Appts: Discharge Orders    Future Appointments: Provider: Department: Dept Phone: Center:   09/25/2011 11:15 AM Raul Del, RN [facility] [phone] None   09/25/2011 1:45 PM Waymon Budge, MD [facility] [phone] None   09/28/2011 8:25 AM Amber Caryl Bis, RN [facility] [phone] [id number]     Future Orders Please Complete By Expires   Diet - low sodium heart healthy      Increase activity slowly      Discharge instructions      Comments:   Followup with Dr. Maple Hudson in 1 week (as previously scheduled). Followup with [name], MD (PCP) in 1-2 weeks.        Time spent on discharge, talking to the patient, and coordinating care: 40 mins.   Signed: Cristal Ford, MD 09/21/2011, 2:09 PM

## 2011-09-21 NOTE — Progress Notes (Signed)
09/21/2011 [name] [facility] Case Management Note [phone]       Utilization review completed.  

## 2011-09-21 NOTE — Progress Notes (Signed)
Subjective: Continues to be short of breath but is improved from admission.  Still noting intermittent wheezing.  Objective: Vital signs in last 24 hours: Filed Vitals:   09/21/11 0451 09/21/11 0819 09/21/11 0953 09/21/11 0954  BP: 139/67   128/64  Pulse: 62  63   Temp: 98.4 F (36.9 C)     TempSrc:      Resp: 24     Height:      Weight: 58.7 kg (129 lb 6.6 oz)     SpO2: 96% 94%     Weight change: 0 kg (0 lb)  Intake/Output Summary (Last 24 hours) at 09/21/11 1252 Last data filed at 09/21/11 1100  Gross per 24 hour  Intake    960 ml  Output   2850 ml  Net  -1890 ml   Physical Exam: General: Awake, Oriented, No acute distress. HEENT: EOMI. Neck: Supple CV: S1 and S2 Lungs: Scattered expiratory wheezing bilaterally.  However the patient was getting her breathing treatment.  Moderate air movement. Abdomen: Soft, Nontender, Nondistended, +bowel sounds. Ext: Good pulses. Trace edema.  Lab Results:  [facility] 09/20/11 0535 09/19/11 1145  NA 138 130*  K 3.5 3.4*  CL 96 91*  CO2 32 29  GLUCOSE 162* 157*  BUN 25* 18  CREATININE 0.73 0.68  CALCIUM 9.3 9.4  MG -- --  PHOS -- --   No results found for this basename: AST:2,ALT:2,ALKPHOS:2,BILITOT:2,PROT:2,ALBUMIN:2 in the last 72 hours No results found for this basename: LIPASE:2,AMYLASE:2 in the last 72 hours  Basename 09/20/11 0535 09/19/11 1145  WBC 13.4* 15.3*  NEUTROABS -- --  HGB 12.1 12.4  HCT 37.8 37.7  MCV 83.4 82.1  PLT 207 215   No results found for this basename: CKTOTAL:3,CKMB:3,CKMBINDEX:3,TROPONINI:3 in the last 72 hours No results found for this basename: POCBNP:3 in the last 72 hours No results found for this basename: DDIMER:2 in the last 72 hours No results found for this basename: HGBA1C:2 in the last 72 hours No results found for this basename: CHOL:2,HDL:2,LDLCALC:2,TRIG:2,CHOLHDL:2,LDLDIRECT:2 in the last 72 hours No results found for this basename: TSH,T4TOTAL,FREET3,T3FREE,THYROIDAB in the  last 72 hours No results found for this basename: VITAMINB12:2,FOLATE:2,FERRITIN:2,TIBC:2,IRON:2,RETICCTPCT:2 in the last 72 hours  Micro Results: Recent Results (from the past 240 hour(s))  CULTURE, SPUTUM-ASSESSMENT     Status: Normal   Collection Time   09/17/11  9:52 PM      Component Value Range Status Comment   Specimen Description SPUTUM   Final    Special Requests Normal   Final    Sputum evaluation     Final    Value: THIS SPECIMEN IS ACCEPTABLE. RESPIRATORY CULTURE REPORT TO FOLLOW.   Report Status 09/17/2011 FINAL   Final   CULTURE, RESPIRATORY     Status: Normal   Collection Time   09/17/11  9:52 PM      Component Value Range Status Comment   Specimen Description SPUTUM   Final    Special Requests NONE   Final    Gram Stain     Final    Value: FEW WBC PRESENT, PREDOMINANTLY PMN     RARE SQUAMOUS EPITHELIAL CELLS PRESENT     NO ORGANISMS SEEN   Culture NORMAL OROPHARYNGEAL FLORA   Final    Report Status 09/20/2011 FINAL   Final      Medications: I have reviewed the patient's current medications. Scheduled Meds:    . amoxicillin-clavulanate  1 tablet Oral Q12H  . digoxin  125 mcg Oral Daily  .  diltiazem  240 mg Oral Daily  . ezetimibe  10 mg Oral Daily  . furosemide  20 mg Oral BID  . guaiFENesin  1,200 mg Oral BID  . levalbuterol  0.63 mg Nebulization Q6H  . methylPREDNISolone  4 mg Oral 3 x daily with food  . methylPREDNISolone  4 mg Oral 4X daily taper  . methylPREDNISolone  4 mg Oral PC supper  . methylPREDNISolone  8 mg Oral AC breakfast  . methylPREDNISolone  8 mg Oral Nightly  . methylPREDNISolone  8 mg Oral Nightly  . methylPREDNISolone  4 mg Oral Once  . mometasone-formoterol  2 puff Inhalation BID  . montelukast  5 mg Oral QHS  . pantoprazole  40 mg Oral Daily  . potassium chloride SA  20 mEq Oral BID  . sertraline  50 mg Oral Daily  . sotalol  80 mg Oral BID  . tiotropium  18 mcg Inhalation Daily  . warfarin  0.5 mg Oral ONCE-1800  .  DISCONTD: furosemide  20 mg Intravenous Q12H  . DISCONTD: methylPREDNISolone  4 mg Oral PC lunch  . DISCONTD: methylPREDNISolone  4 mg Oral PC supper  . DISCONTD: methylPREDNISolone (SOLU-MEDROL) injection  60 mg Intravenous Q8H  . DISCONTD: methylPREDNISolone (SOLU-MEDROL) injection  60 mg Intravenous Q8H   Continuous Infusions:  PRN Meds:.acetaminophen, acetaminophen, docusate sodium, levalbuterol, levalbuterol  Assessment/Plan: 1. COPD with exacerbation (Acute on chronic Respiratory failure (on chronic O2)), with presumed co pneumonia. Improved. Continue Solu-Medrol change IV to PO.  Continue Augmentin, Abx since 09/15/2011.   2. Presumed acute diastolic congestive heart failure.  On PO Lasix twice daily.  Patient had a 2-D echocardiogram on 09/16/2011 it showed left ventricle cavity size was normal wall thickness was increased in the pattern of mild LVH.  Ejection fraction was 60%.  3. Atrial fibrillation.  Patient is rate controlled.  Continue chronic anticoagulation. INR therapeutic.  4. History of aortic valve replacement.  Continue chronic anticoagulation.  5. Hypertension stable.  6. History of coronary artery disease stable.  7. Hyponatremia.  Monitor for now.  8. Hypokalemia.  Resolved with replacement.  9. Leukocytosis.  Likely due to steroids.  10.  Prophylaxis.  Therapeutic INR.  11.  Disposition.  Pending. Patient would like to go to rehab, as she does not have anyone to take care of her 24 hours at home.   LOS: 6 days  Armour Villanueva A, MD 09/21/2011, 12:52 PM

## 2011-09-25 ENCOUNTER — Ambulatory Visit (INDEPENDENT_AMBULATORY_CARE_PROVIDER_SITE_OTHER): Payer: Medicare Other

## 2011-09-25 ENCOUNTER — Encounter: Payer: Medicare Other | Admitting: *Deleted

## 2011-09-25 ENCOUNTER — Encounter: Payer: Self-pay | Admitting: Internal Medicine

## 2011-09-25 ENCOUNTER — Ambulatory Visit (INDEPENDENT_AMBULATORY_CARE_PROVIDER_SITE_OTHER): Payer: Medicare Other | Admitting: Internal Medicine

## 2011-09-25 VITALS — BP 120/72 | HR 69 | Ht 62.5 in | Wt 131.0 lb

## 2011-09-25 DIAGNOSIS — R042 Hemoptysis: Secondary | ICD-10-CM

## 2011-09-25 DIAGNOSIS — J309 Allergic rhinitis, unspecified: Secondary | ICD-10-CM

## 2011-09-25 DIAGNOSIS — R918 Other nonspecific abnormal finding of lung field: Secondary | ICD-10-CM

## 2011-09-25 DIAGNOSIS — J441 Chronic obstructive pulmonary disease with (acute) exacerbation: Secondary | ICD-10-CM

## 2011-09-25 DIAGNOSIS — R222 Localized swelling, mass and lump, trunk: Secondary | ICD-10-CM

## 2011-09-25 MED ORDER — LEVALBUTEROL HCL 0.63 MG/3ML IN NEBU: 0.6300 mg | INHALATION_SOLUTION | RESPIRATORY_TRACT | Status: DC | PRN

## 2011-09-25 NOTE — Patient Instructions (Signed)
Order CT chest  No contrast    Dx lung mass, hemoptysis   Script for [employer]   Brand Xopenex 1.25 mg

## 2011-09-25 NOTE — Progress Notes (Signed)
Patient ID: ARLYNN STARE, female    DOB: January 16, 1924, 77 y.o.   MRN: [id number]  HPI 03/29/11- 50 yoF former smoker followed for COPD, chronic hypoxic respiratory failure, complicated by atrial fibrillation, valvular heart disease and intermittent hemoptysis. Daughter here. Last here January 19, 2011, feeling dizzy and brown sputum. She did get better. Then at end of March she took some left over prednsione for dyspnea with throat irritation. Now, 1 week ago she noted increased nasal discharge- mucus and blood. 3 days ago she began coughing more, with increasing nasal congestion. By yesterday she mostly stayed in bed because of weakness. She thinks she had fever yesterday. Sputum now green.   05/15/11- 38 yoF former smoker followed for COPD, chronic hypoxic respiratory failure, complicated by atrial fibrillation, valvular heart disease and intermittent hemoptysis. Acute visit- We had sent script for prednisone. She can't stay off prednisone, at least 10 mg daily, and complains of weight gain in abdomen. Dyspnea walking hall at home on 3.5 L/m O2 has to pace herself. Ok sitting still. Reaching and lifting with arms is especially hard. Family visits to help with chores. Some soreness under ribs if she leans over. Coughs up clear mucus- sometimes green or dark yellow. Something caused itching ? Doxycycline.  05/21/11-86 yoF former smoker followed for COPD, chronic hypoxic respiratory failure, complicated by atrial fibrillation, valvular heart disease and intermittent hemoptysis. At last visit we gave augmentin and medrol 8 mg daily.. After a few days she was feeling better. Over next few days she was more short of breath, and quite winded today after coming alone, carrying her O2. Sputum is ligther yellow. Finishes augmentin tomorrow. She stayed in over the weekend to avoid air quality.  07/23/2011-86 yoF former smoker followed for COPD, chronic hypoxic respiratory failure, complicated by atrial fibrillation,  valvular heart disease and intermittent hemoptysis. She blames rain and pollen this week for nasal congestion. Minor epistaxis despite saline spray. Short of breath with her reading but that's not really different. Coughing a little scant yellow sputum, not bad. This happened about the time she changed to generic Singulair so she's not sure if it's a medication effect but does not feel as if she has cold. Continues oxygen at 3-3.5 L.  09/25/11- 86 yoF former smoker followed for COPD, chronic hypoxic respiratory failure, complicated by atrial fibrillation, valvular heart disease and intermittent hemoptysis Post hospital- 11/10-11/16/12- she was discharged with diagnoses COPD with exacerbation, acute on chronic respiratory failure on chronic oxygen, presumed community-acquired pneumonia, acute diastolic congestive heart failure, atrial fibrillation, history of aortic valve replacement, hypertension, history of coronary artery disease. She had had some blood in her sputum again. Chest x-ray had shown previous CABG and a right chest wall pacemaker, normal heart size, peripheral opacity within the right mid lung which was new with left pleural effusion and left lower lobe airspace consolidation. The concern was for multifocal pneumonia. She still feels especially unsteady if she tries to walk with her walker. There still is some blood streak in her sputum after treatment with broad-spectrum antibiotics in hospital. She expresses strong preference for brand-name Xopenex nebulizer solution rather than generic which she does not consider is effective.   Review of Systems See HPI Constitutional:   No weight loss, night sweats,HEENT:   No headaches,  Difficulty swallowing,  Tooth/dental problems,                No sneezing, itching, ear ache,, post nasal drip,  CV:  No chest pain,  Orthopnea, PND, swelling in lower extremities, anasarca, dizziness, palpitations GI  No heartburn, indigestion, abdominal pain,  nausea, vomiting, diarrhea, change in bowel habits, loss of appetite Resp:   Productive cough excess mucus,   + coughing up of blood.  Skin: no rash or lesions. GU: no dysuria, change in color of urine, no urgency or frequency.  No flank pain. MS:  No joint pain or swelling.  No decreased range of motion.  No back pain. Psych:  No change in mood or affect. No depression or anxiety.  No memory loss.   Objective:   Physical ExamSATURATION QUALIFICATIONS: Patient Saturations on Room Air at Rest = 83% Patient Saturations on Room Air while Ambulating = 82% Patient Saturations on 3 Liters of oxygen while Ambulating = 92%---Katie Welchel,CMA General- Alert, Oriented, Affect-appropriate, Distress- none acute   O2 3 L/M, wheelchair. Talkative. Skin- rash-none, lesions- none, excoriation- none Lymphadenopathy- none Head- atraumatic            Eyes- Gross vision intact, PERRLA, conjunctivae clear secretions            Ears- Hearing, canals-normal for age            Nose- Clear, no-Septal dev, mucus, polyps, erosion, perforation             Throat- Mallampati II-III , mucosa clear , drainage- none, tonsils- atrophic Neck- flexible , trachea midline, no stridor , thyroid nl, carotid no bruit Chest - symmetrical excursion , unlabored           Heart/CV- RRR , 2/6 systolic LSB murmur , no gallop  , no rub, nl s1 s2                           - JVD- 1cm , edema- none, stasis changes- none, varices- none           Lung- No rhonchi, but wet rattle when she laughs..   wheeze- none, cough- none , dullness-none, rub- none           Chest wall-  Abd- tender-no, distended-no, bowel sounds-present, HSM- no Br/ Gen/ Rectal- Not done, not indicated Extrem- cyanosis- none, clubbing, none, atrophy- none, strength- nl Neuro- grossly intact to observation

## 2011-09-26 ENCOUNTER — Ambulatory Visit (INDEPENDENT_AMBULATORY_CARE_PROVIDER_SITE_OTHER)
Admission: RE | Admit: 2011-09-26 | Discharge: 2011-09-26 | Disposition: A | Discharge status: Home / Self Care | Payer: Medicare Other | Source: Ambulatory Visit | Attending: Internal Medicine | Admitting: Internal Medicine

## 2011-09-26 ENCOUNTER — Encounter: Payer: Self-pay | Admitting: Internal Medicine

## 2011-09-26 ENCOUNTER — Ambulatory Visit (INDEPENDENT_AMBULATORY_CARE_PROVIDER_SITE_OTHER): Payer: Medicare Other | Admitting: *Deleted

## 2011-09-26 ENCOUNTER — Telehealth: Payer: Self-pay | Admitting: Internal Medicine

## 2011-09-26 ENCOUNTER — Ambulatory Visit (INDEPENDENT_AMBULATORY_CARE_PROVIDER_SITE_OTHER): Payer: Medicare Other | Admitting: Cardiovascular Disease

## 2011-09-26 ENCOUNTER — Encounter: Payer: Self-pay | Admitting: Cardiovascular Disease

## 2011-09-26 DIAGNOSIS — R918 Other nonspecific abnormal finding of lung field: Secondary | ICD-10-CM

## 2011-09-26 DIAGNOSIS — R222 Localized swelling, mass and lump, trunk: Secondary | ICD-10-CM

## 2011-09-26 DIAGNOSIS — I495 Sick sinus syndrome: Secondary | ICD-10-CM

## 2011-09-26 DIAGNOSIS — I350 Nonrheumatic aortic (valve) stenosis: Secondary | ICD-10-CM

## 2011-09-26 DIAGNOSIS — I359 Nonrheumatic aortic valve disorder, unspecified: Secondary | ICD-10-CM

## 2011-09-26 DIAGNOSIS — R042 Hemoptysis: Secondary | ICD-10-CM

## 2011-09-26 LAB — PACEMAKER DEVICE OBSERVATION
AL AMPLITUDE: 3.3132 mv
AL IMPEDENCE PM: 416 Ohm
BAMS-0001: 170 {beats}/min
BATTERY VOLTAGE: 3 V
RV LEAD AMPLITUDE: 7.3786 mv
VENTRICULAR PACING PM: 12.31

## 2011-09-26 NOTE — Progress Notes (Signed)
PPM check 

## 2011-09-26 NOTE — Progress Notes (Signed)
Carolynn Sayers Date of Birth  1924-03-11 [facility] [address]    [address] [place], [place]  [address] [phone]  Fax  [phone]  History of Present Illness:  Ms. Oros is an 77 y.o. female with endstage COPD and aortic valve replacement (bioprosthetic valve).  She has recurrent aortic stenosis with mean aortic valve gradient of around 30.   She was recently hsopitalized for COPD exacerbation and pneumonia.  She has been found to have a right lung mass.  There was some question as to whether or not she had diastolic heart failure.  She's done well with treatment of her COPD. Her steroid dose was increased. She's not had any other signs or symptoms of congestive heart failure.  Current Outpatient Prescriptions on File Prior to Visit  Medication Sig Dispense Refill  . Alum & Mag Hydroxide-Simeth (MAGIC MOUTHWASH) SOLN Take 5 mLs by mouth 4 (four) times daily as needed. For mouth irritations       . digoxin (LANOXIN) 0.125 MG tablet Take 1 tablet (125 mcg total) by mouth daily.  30 tablet  6  . diltiazem (CARDIZEM CD) 240 MG 24 hr capsule Take 1 capsule (240 mg total) by mouth daily.  30 capsule  6  . diphenhydrAMINE (CHILDRENS ALLERGY) 12.5 MG/5ML liquid Take 12.5 mg by mouth at bedtime.       [name] Calcium (STOOL SOFTENER PO) Take 100 mg by mouth daily.       . DULERA 200-5 MCG/ACT AERO USE 2 PUFFS TWO TIMES A DAY AS NEEDED  1 Inhaler  6  . ezetimibe (ZETIA) 10 MG tablet Take 1 tablet (10 mg total) by mouth daily.  30 tablet  5  . furosemide (LASIX) 40 MG tablet Take 20 mg by mouth 2 (two) times daily.        . Guaifenesin (MUCINEX MAXIMUM STRENGTH) 1200 MG TB12 Take 1,200 mg by mouth 2 (two) times daily at 10 AM and 5 PM.        . IRON PO Take 1 tablet by mouth daily.        Marland Kitchen levalbuterol (XOPENEX HFA) 45 MCG/ACT inhaler Inhale 2 puffs into the lungs every 6 (six) hours as needed. For shortness of breath      . levalbuterol (XOPENEX) 0.63 MG/3ML  nebulizer solution Take 3 mLs (0.63 mg total) by nebulization every 2 (two) hours as needed for wheezing or shortness of breath.  270 mL  3  . methylPREDNISolone (MEDROL) 4 MG tablet Take 1 tablet (4 mg total) by mouth daily. Resume after completing the dose pack.      . montelukast (SINGULAIR) 10 MG tablet TAKE 1 TABLET DAILY FOR ASTHMA  30 tablet  2  . NEXIUM 40 MG capsule TAKE 1 TABLET BY MOUTH ONCE A DAY  90 capsule  0  . potassium chloride SA (K-DUR,KLOR-CON) 20 MEQ tablet Take 20 mEq by mouth 2 (two) times daily.        . sertraline (ZOLOFT) 50 MG tablet Take 50 mg by mouth daily.       Marland Kitchen SPIRIVA HANDIHALER 18 MCG inhalation capsule INHALE CONTENTS OF 1 CAPSULE ONCE A DAY  30 each  5  . warfarin (COUMADIN) 1 MG tablet Take 0.5-1 mg by mouth daily. Take 0.5 tablet on Tuesday & Thursday Take 1 tablet on all other days      . levalbuterol (XOPENEX) 1.25 MG/3ML nebulizer solution Take 1 ampule by nebulization 3 (three) times daily. DX:  493.20         Allergies  Allergen Reactions  . Atorvastatin     Forgetfulness   . Cephalexin     Unsure as to reaction. "I can take Penicillin"  . Codeine Nausea And Vomiting  . Crestor (Rosuvastatin Calcium)     Forgetfulness    . Estradiol     Unknown reaction  . Hydrocortisone Hives  . Mevacor Other (See Comments)    forgetfullness,  . Moxifloxacin     Unknown reaction  . Sulfonamide Derivatives     itching    Past Medical History  Diagnosis Date  . Asthmatic bronchitis   . Allergic rhinitis   . Atrial fibrillation   . History of aortic valve replacement Dr. Tyrone Sage 2006    Has severe residual gradient and will be managed conservatively  . Sleep apnea   . Chronic obstructive asthma   . Osteoporosis   . Pruritic disorder   . Diverticulosis   . Hemorrhoids   . GERD (gastroesophageal reflux disease)   . IBS (irritable bowel syndrome)   . Pacemaker 2010  . Oxygen dependent   . Atrial flutter     on Betapace  . High risk  medication use     on Betapace & Coumadin  . Chronic anticoagulation   . COPD (chronic obstructive pulmonary disease)   . Pneumonia   . Anemia   . Arthritis   . Hypertension   . Coronary artery disease     Past Surgical History  Procedure Date  . Back surgery 1994  . Tonsillectomy   . Appendectomy   . Ganglion cyst excision   . Vocal cord polyps 1975 and 1984  . Aortic valve replacement 2006  . Pacemaker insertion 2010  . Cardiac electrophysiology mapping and ablation   . Coronary artery bypass graft   . Insert / replace / remove pacemaker   . Eye surgery   . Cardiac catheterization   . Coronary angioplasty   . Cardiac valve replacement     History  Smoking status  . Former Smoker -- 1.0 packs/day for 50 years  . Types: Cigarettes  . Quit date: 11/05/1993  Smokeless tobacco  . Never Used    History  Alcohol Use No    Family History  Problem Relation Age of Onset  . Kidney failure Father   . Colon cancer Paternal Aunt   . Heart disease Maternal Grandfather   . Heart attack Paternal Grandfather   . Breast cancer Daughter   . Stroke Maternal Grandmother   . Heart disease Mother     Reviw of Systems:  Reviewed in the HPI.  All other systems are negative.  Physical Exam: BP 117/58  Pulse 61  Ht 5' 2.5" (1.588 m)  Wt 129 lb 6.4 oz (58.695 kg)  BMI 23.29 kg/m2 The patient is alert and oriented x 3.  The mood and affect are normal.   Skin: warm and dry.  Color is normal.    HEENT:   [place]/AT, no JVD, mucmus membranes are normal  Lungs: scattered wheezes,    Heart: RR S1, S2, 2/6 systolic murmur    Abdomen: _+BS, nontender  Extremities:  Extensive bruising, no edema  Neuro:  Nonfocal. She was examined in the wheelchair.    ECG: She had a pacemaker check today.  Assessment / Plan:

## 2011-09-26 NOTE — Patient Instructions (Signed)
Your physician wants you to follow-up in: 6 months, You will receive a reminder letter in the mail two months in advance. If you don't receive a letter, please call our office to schedule the follow-up appointment.  Your physician recommends that you continue on your current medications as directed. Please refer to the Current Medication list given to you today.   

## 2011-09-26 NOTE — Assessment & Plan Note (Signed)
She has had a aortic valve replacement. She has underlying residual aortic stenosis. She is not a candidate for redo surgery. We'll continue to follow her.  Almost all of her problems are due to COPD and not her aortic stenosis.

## 2011-09-26 NOTE — Telephone Encounter (Signed)
CY is aware of message.

## 2011-09-28 ENCOUNTER — Encounter: Payer: Medicare Other | Admitting: *Deleted

## 2011-09-28 ENCOUNTER — Telehealth: Payer: Self-pay | Admitting: Internal Medicine

## 2011-09-28 NOTE — Telephone Encounter (Signed)
Per CY---use the original rx for  generic xopenex 0.125 tid prn.  Called and spoke with [facility] pharmacy and they are aware of CY recs for the neb meds.  90 day supply with 3 refills.

## 2011-09-28 NOTE — Telephone Encounter (Signed)
Spoke with pharmacy and they want to clarify rx for pt xopenex.  The rx that was sent was for xopenex 0.63 every 2 hours as needed with Brand medically nec. All the previous scripts were for xopenex 1.25 three times daily and the pt received the generic. Please advise if the chang in the xopenex is correct. Thanks. Carron Curie, CMA

## 2011-09-29 NOTE — Assessment & Plan Note (Addendum)
Recent hospital stay was an acute exacerbation of her chronic respiratory failure with components of pneumonia, hemorrhagic bronchitis and diastolic heart failure. She is improved but not completely clear. We discussed the need to better image her chest to understand why she continues to cough up blood and to assess the residual parenchymal density. She has made clear that the generic Xopenex does not work for her nearly as well as the brand name product and she is willing to pay the difference.

## 2011-10-08 ENCOUNTER — Telehealth: Payer: Self-pay | Admitting: Internal Medicine

## 2011-10-08 MED ORDER — LEVALBUTEROL HCL 1.25 MG/3ML IN NEBU: 1.0000 | INHALATION_SOLUTION | Freq: Three times a day (TID) | RESPIRATORY_TRACT | Status: DC

## 2011-10-08 NOTE — Progress Notes (Signed)
Quick Note:  Pts daughter Neysa Bonito is aware of results and will relay info to patient. ______

## 2011-10-08 NOTE — Telephone Encounter (Signed)
This has been corrected to Xopenex 1.25 and new Rx called to [facility]-the original Rx was for BMN xopenex 0.63 then [facility] called and we changed the strength to 1.25-however information was not given correctly to [facility] to give BMN. Neysa Bonito is aware of new Rx sent to [facility]. Rx updated on pts med list.

## 2011-10-08 NOTE — Telephone Encounter (Signed)
Called and spoke with pt's daughter, Neysa Bonito.  She states pt received her shipment of Xopenex in the mail from [facility] but stated this was for the generic Xopenex.  Pt states she had spoke with Florentina Addison regarding this and pt was supposed to get BMN.  I reviewed pt's chart, and when pt last saw CY on 09/25/11, patient instructions state xopenex 1.25 BMN. Also, after reviewing pt's current med list, the xopenex 1.25 has been crossed out and the xopenex 0.63 was what was sent to [facility].  Florentina Addison, can you please help straighten this out.  Is pt supposed to be put on 1.25 as per d/c instructions by CY or the 0.63.

## 2011-10-11 ENCOUNTER — Telehealth: Payer: Self-pay | Admitting: Internal Medicine

## 2011-10-11 NOTE — Telephone Encounter (Signed)
Spoke with pt's daughter. She states that the rx for xopenex was finally done correctly and pt will receive the med BMN. She wants [name] to place a note in her chart that med will always need to be BMN, that way this will not happen again. I placed comment stating this in the med list comments section of the chart. Nothing further needed.

## 2011-10-25 ENCOUNTER — Ambulatory Visit (INDEPENDENT_AMBULATORY_CARE_PROVIDER_SITE_OTHER): Payer: Medicare Other | Admitting: Internal Medicine

## 2011-10-25 ENCOUNTER — Encounter: Payer: Self-pay | Admitting: Internal Medicine

## 2011-10-25 ENCOUNTER — Ambulatory Visit (INDEPENDENT_AMBULATORY_CARE_PROVIDER_SITE_OTHER): Payer: Medicare Other

## 2011-10-25 ENCOUNTER — Telehealth: Payer: Self-pay | Admitting: Cardiovascular Disease

## 2011-10-25 ENCOUNTER — Telehealth: Payer: Self-pay

## 2011-10-25 VITALS — BP 120/62 | HR 78 | Ht 62.5 in | Wt 133.8 lb

## 2011-10-25 DIAGNOSIS — Z23 Encounter for immunization: Secondary | ICD-10-CM

## 2011-10-25 DIAGNOSIS — J309 Allergic rhinitis, unspecified: Secondary | ICD-10-CM

## 2011-10-25 DIAGNOSIS — J189 Pneumonia, unspecified organism: Secondary | ICD-10-CM

## 2011-10-25 DIAGNOSIS — J449 Chronic obstructive pulmonary disease, unspecified: Secondary | ICD-10-CM

## 2011-10-25 DIAGNOSIS — Z0000 Encounter for general adult medical examination without abnormal findings: Secondary | ICD-10-CM

## 2011-10-25 NOTE — Patient Instructions (Addendum)
Order- CT chest no contrast  Dx pneumonia  Order- routine mamogram [facility]  Ask Dr Elease Hashimoto about your concern of aspirin vs coumadin for you now.   Pneumovax

## 2011-10-25 NOTE — Telephone Encounter (Signed)
Per [name] pt has been in rehab. Gave VO to check INR tomorrow.

## 2011-10-25 NOTE — Telephone Encounter (Signed)
New message:  HH would  Like to manage her PT/INR draws.  Please call them to give orders, etc.  They would like to get started tomorrow.

## 2011-10-25 NOTE — Progress Notes (Signed)
Patient ID: [name], female    DOB: 1924-06-05, 77 y.o.   MRN: [id number]  HPI 03/29/11- 57 yoF former smoker followed for COPD, chronic hypoxic respiratory failure, complicated by atrial fibrillation, valvular heart disease and intermittent hemoptysis. Daughter here. Last here January 19, 2011, feeling dizzy and brown sputum. She did get better. Then at end of March she took some left over prednsione for dyspnea with throat irritation. Now, 1 week ago she noted increased nasal discharge- mucus and blood. 3 days ago she began coughing more, with increasing nasal congestion. By yesterday she mostly stayed in bed because of weakness. She thinks she had fever yesterday. Sputum now green.   05/15/11- 7 yoF former smoker followed for COPD, chronic hypoxic respiratory failure, complicated by atrial fibrillation, valvular heart disease and intermittent hemoptysis. Acute visit- We had sent script for prednisone. She can't stay off prednisone, at least 10 mg daily, and complains of weight gain in abdomen. Dyspnea walking hall at home on 3.5 L/m O2 has to pace herself. Ok sitting still. Reaching and lifting with arms is especially hard. Family visits to help with chores. Some soreness under ribs if she leans over. Coughs up clear mucus- sometimes green or dark yellow. Something caused itching ? Doxycycline.  05/21/11-86 yoF former smoker followed for COPD, chronic hypoxic respiratory failure, complicated by atrial fibrillation, valvular heart disease and intermittent hemoptysis. At last visit we gave augmentin and medrol 8 mg daily.. After a few days she was feeling better. Over next few days she was more short of breath, and quite winded today after coming alone, carrying her O2. Sputum is ligther yellow. Finishes augmentin tomorrow. She stayed in over the weekend to avoid air quality.  07/23/2011-86 yoF former smoker followed for COPD, chronic hypoxic respiratory failure, complicated by atrial fibrillation,  valvular heart disease and intermittent hemoptysis. She blames rain and pollen this week for nasal congestion. Minor epistaxis despite saline spray. Short of breath with her reading but that's not really different. Coughing a little scant yellow sputum, not bad. This happened about the time she changed to generic Singulair so she's not sure if it's a medication effect but does not feel as if she has cold. Continues oxygen at 3-3.5 L.  09/25/11- 86 yoF former smoker followed for COPD, chronic hypoxic respiratory failure, complicated by atrial fibrillation, valvular heart disease and intermittent hemoptysis Post hospital- 11/10-11/16/12- she was discharged with diagnoses COPD with exacerbation, acute on chronic respiratory failure on chronic oxygen, presumed community-acquired pneumonia, acute diastolic congestive heart failure, atrial fibrillation, history of aortic valve replacement, hypertension, history of coronary artery disease. She had had some blood in her sputum again. Chest x-ray had shown previous CABG and a right chest wall pacemaker, normal heart size, peripheral opacity within the right mid lung which was new with left pleural effusion and left lower lobe airspace consolidation. The concern was for multifocal pneumonia. She still feels especially unsteady if she tries to walk with her walker. There still is some blood streak in her sputum after treatment with broad-spectrum antibiotics in hospital. She expresses strong preference for brand-name Xopenex nebulizer solution rather than generic which she does not consider is effective.   10/25/11- 23 yoF former smoker followed for COPD, chronic hypoxic respiratory failure, complicated by atrial fibrillation, valvular heart disease and intermittent hemoptysis After last hosp, she spent 4 weeks in rehab SNF. Feels much stronger. Some edema of feet. Last CT had indicated chronic pneumonia/ infiltrates- reviewed with her.  CT chest  09/25/11- IMPRESSION:  1. There are bilateral areas of peripheral and subpleural  consolidation within the right upper lobe, right middle lobe and  posterior medial left lung base. The appearance is nonspecific  favoring inflammatory or infectious etiologies. Underlying  malignancy cannot be excluded and interval follow-up is can be  necessary to ensure complete resolution. Recommend follow-up  imaging and 3-6 weeks after appropriate antibiotic therapy. The  study of choice would be a noncontrast CT of the chest.  2. Advanced emphysema.  Original Report Authenticated By: Rosealee Albee, M.D.   Review of Systems -See HPI Constitutional:   No weight loss, night sweats,HEENT:   No headaches,  Difficulty swallowing,  Tooth/dental problems,                No sneezing, itching, ear ache,, post nasal drip,  CV:  No chest pain,  Orthopnea, PND, swelling in lower extremities, anasarca, dizziness, palpitations GI  No heartburn, indigestion, abdominal pain, nausea, vomiting, diarrhea, change in bowel habits, loss of appetite Resp:   Productive cough excess mucus,   + coughing up of blood.  Skin: no rash or lesions. GU: no dysuria, change in color of urine, no urgency or frequency.  No flank pain. MS:  No joint pain or swelling.  No decreased range of motion.  No back pain. Psych:  No change in mood or affect. No depression or anxiety.  No memory loss.   Objective:   Physical ExamSATURATION QUALIFICATIONS: Patient Saturations on Room Air at Rest = 83% Patient Saturations on Room Air while Ambulating = 82% Patient Saturations on 3 Liters of oxygen while Ambulating = 92%---Katie Welchel,CMA General- Alert, Oriented, Affect-appropriate, Distress- none acute   O2 3 L/M, wheelchair. Talkative. Skin- rash-none, lesions- none, excoriation- none Lymphadenopathy- none Head- atraumatic            Eyes- Gross vision intact, PERRLA, conjunctivae clear secretions            Ears- Hearing, canals-normal  for age            Nose- Clear, no-Septal dev, mucus, polyps, erosion, perforation             Throat- Mallampati II-III , mucosa clear , drainage- none, tonsils- atrophic Neck- flexible , trachea midline, no stridor , thyroid nl, carotid no bruit Chest - symmetrical excursion , unlabored           Heart/CV- RRR , 2-3/6 systolic LSB murmur , no gallop  , no rub, nl s1 s2                           - JVD- 1cm , edema- trace, stasis changes- none, varices- none           Lung- Scattered crackles,   wheeze- none, cough- none , dullness-none, rub- none           Chest wall-  Abd- tender-no, distended-no, bowel sounds-present, HSM- no Br/ Gen/ Rectal- Not done, not indicated Extrem- cyanosis- none, clubbing, none, atrophy- none, strength- nl Neuro- grossly intact to observation

## 2011-10-25 NOTE — Telephone Encounter (Signed)
Message left on voicemail: request order for: 1.) Medication management 2.) COPD management  3.) Physical therapy for weakness  4.) Social worker 5.) [facility] Coumadin  ( I reviewed chart, coumadin is not managed by Dr.Hopper)    I called and left message for Lawanna Kobus informing her to contact patient's cardiologist to get orders about managing coumadin level's, I will get the ok for other orders from Dr.Hopper and contact her back  Dr.Hopper please advise

## 2011-10-25 NOTE — Telephone Encounter (Signed)
All are okay except for the Coumadin management. This should be dicussed with  the Coumadin clinic or whichever  physician is managing the warfarin. I do not manage her Coumadin and cannot authorize this switch.

## 2011-10-26 ENCOUNTER — Telehealth: Payer: Self-pay | Admitting: Cardiovascular Disease

## 2011-10-26 NOTE — Telephone Encounter (Signed)
Spoke with [name], informed her Dr.Hopper gave a verbal

## 2011-10-26 NOTE — Telephone Encounter (Signed)
Made made aware that VO was given to [facility] yesterday to check INR today.

## 2011-10-26 NOTE — Telephone Encounter (Signed)
[name]  With [facility] needs order to do INR checks for a while, fax to [phone]

## 2011-10-26 NOTE — Telephone Encounter (Signed)
HH received an order for one INR from [facility].  They want to know if they should keep doing the INRs or if the pt will be coming in here to get it.

## 2011-10-28 ENCOUNTER — Other Ambulatory Visit: Payer: Self-pay | Admitting: Internal Medicine

## 2011-10-29 ENCOUNTER — Encounter: Payer: Self-pay | Admitting: Internal Medicine

## 2011-10-29 NOTE — Assessment & Plan Note (Signed)
Much improved currently, after rehab.

## 2011-10-29 NOTE — Assessment & Plan Note (Signed)
Resolving infiltrates c/w previous pneumonia. We will recheck to assure clearing.

## 2011-10-31 ENCOUNTER — Telehealth: Payer: Self-pay

## 2011-10-31 ENCOUNTER — Other Ambulatory Visit: Payer: Self-pay

## 2011-10-31 MED ORDER — SERTRALINE HCL 50 MG PO TABS: 50.0000 mg | ORAL_TABLET | Freq: Every day | ORAL | Status: DC

## 2011-10-31 NOTE — Telephone Encounter (Signed)
Jan from [facility] called to give primary care  doctor a status update: Patient will have Physical Therapy 1 x weekly for 2 weeks then 2 x weekly for 4 weeks

## 2011-11-01 ENCOUNTER — Other Ambulatory Visit: Payer: Self-pay | Admitting: Internal Medicine

## 2011-11-01 ENCOUNTER — Ambulatory Visit (INDEPENDENT_AMBULATORY_CARE_PROVIDER_SITE_OTHER): Payer: Medicare Other

## 2011-11-01 ENCOUNTER — Ambulatory Visit (INDEPENDENT_AMBULATORY_CARE_PROVIDER_SITE_OTHER)
Admission: RE | Admit: 2011-11-01 | Discharge: 2011-11-01 | Disposition: A | Discharge status: Home / Self Care | Payer: Medicare Other | Source: Ambulatory Visit | Attending: Internal Medicine | Admitting: Internal Medicine

## 2011-11-01 DIAGNOSIS — J309 Allergic rhinitis, unspecified: Secondary | ICD-10-CM

## 2011-11-01 DIAGNOSIS — J189 Pneumonia, unspecified organism: Secondary | ICD-10-CM

## 2011-11-02 ENCOUNTER — Other Ambulatory Visit: Payer: Self-pay | Admitting: *Deleted

## 2011-11-02 ENCOUNTER — Encounter: Payer: Self-pay | Admitting: Internal Medicine

## 2011-11-02 ENCOUNTER — Ambulatory Visit (INDEPENDENT_AMBULATORY_CARE_PROVIDER_SITE_OTHER): Payer: Self-pay | Admitting: Internal Medicine

## 2011-11-02 ENCOUNTER — Ambulatory Visit (INDEPENDENT_AMBULATORY_CARE_PROVIDER_SITE_OTHER): Payer: Medicare Other | Admitting: Internal Medicine

## 2011-11-02 VITALS — BP 120/68 | HR 84 | Temp 98.2°F | Wt 131.6 lb

## 2011-11-02 DIAGNOSIS — I4891 Unspecified atrial fibrillation: Secondary | ICD-10-CM

## 2011-11-02 DIAGNOSIS — R5381 Other malaise: Secondary | ICD-10-CM

## 2011-11-02 DIAGNOSIS — J4489 Other specified chronic obstructive pulmonary disease: Secondary | ICD-10-CM

## 2011-11-02 DIAGNOSIS — R0989 Other specified symptoms and signs involving the circulatory and respiratory systems: Secondary | ICD-10-CM

## 2011-11-02 DIAGNOSIS — R7309 Other abnormal glucose: Secondary | ICD-10-CM

## 2011-11-02 DIAGNOSIS — R5383 Other fatigue: Secondary | ICD-10-CM

## 2011-11-02 DIAGNOSIS — J449 Chronic obstructive pulmonary disease, unspecified: Secondary | ICD-10-CM

## 2011-11-02 LAB — POCT INR: INR: 1.8

## 2011-11-02 MED ORDER — SOTALOL HCL 80 MG PO TABS: 80.0000 mg | ORAL_TABLET | Freq: Two times a day (BID) | ORAL | Status: DC

## 2011-11-02 NOTE — Telephone Encounter (Signed)
Called and spoke with pt and christy/ family member and she verified she is taking betapace 80 mg bid, will put back on her e chart med list.

## 2011-11-02 NOTE — Progress Notes (Signed)
Addended by: Candie Echevaria L on: 11/02/2011 04:14 PM   Modules accepted: Orders

## 2011-11-02 NOTE — Patient Instructions (Addendum)
.  Share results with all  Non [place] MDs seen . Please use the flutter valve or incentive spirometer to help clear the lungs & prevent  atelectasis as discussed.centive spirometer

## 2011-11-02 NOTE — Telephone Encounter (Signed)
Lmtcb, need to clarify betapace use, unsure if it was dc'd. I see it in paper chart, I called pharmacy and it was last filled 08/24/11 so there is a one month gap, I called Dr Caryl Never office and spoke with cma [place] because it was removed 11/12 by Florentina Addison cma but she doesn't work there. Will continue to call patient to clarify med.

## 2011-11-02 NOTE — Progress Notes (Signed)
  Subjective:    Patient ID: [name], female    DOB: 17-Jun-1924, 77 y.o.   MRN: [id number]  HPI   She was hospitalized with pneumonia in November; following-she was in a nursing facility for rehabilitation. She left that facility 12/18.  She is presently on Medrol. She is not checking her sugars. She does have dry mouth with increased thirst but she denies polyuria or polyphagia.  She denies fever or chills. She has had intermittent yellow to green sputum. CAT scan 12/27 ordered by her pulmonologist revealed resolution of the left lower lobe and right middle lobe infiltrates. There is residual change in the right lateral lobe. She has a followup appointment with Dr. Maple Hudson.  Her major symptom is weakness since her discharge     Review of Systems     Objective:   Physical Exam   She actually appears well nourished; she is in no acute distress  She has a regular rhythm with a grade 1 systolic murmur. Chest is clear without rhonchi, rales, or wheezes. Breath sounds are markedly decreased.  Abdomen reveals normal bowel sounds; abdomen is nontender to palpation.  Degenerative joint changes are noted in the hands. She has no clubbing or cyanosis.  No peripheral edema is noted.  She has no lymphadenopathy about the head, neck or axilla.        Assessment & Plan:    #1 she describes fatigue in the setting of having had pneumonia for which she is posthospitalization rehabilitation.  She is on steroids; diabetic status should be assessed. Chemistries, blood count and thyroid function will be checked.  #2 significant improvement on followup chest CT but residual right lung changes. She's continuing to have some intermittent purulent secretions. Followup will be scheduled with Dr. Maple Hudson

## 2011-11-03 LAB — CBC WITH DIFFERENTIAL/PLATELET
Basophils Relative: 0 % (ref 0–1)
Eosinophils Absolute: 0 10*3/uL (ref 0.0–0.7)
HCT: 41.4 % (ref 36.0–46.0)
Hemoglobin: 12.7 g/dL (ref 12.0–15.0)
MCH: 27.3 pg (ref 26.0–34.0)
MCHC: 30.7 g/dL (ref 30.0–36.0)
Monocytes Absolute: 0.7 10*3/uL (ref 0.1–1.0)
Monocytes Relative: 6 % (ref 3–12)

## 2011-11-03 LAB — BASIC METABOLIC PANEL
CO2: 33 mEq/L — ABNORMAL HIGH (ref 19–32)
Chloride: 99 mEq/L (ref 96–112)
Potassium: 5.1 mEq/L (ref 3.5–5.3)
Sodium: 140 mEq/L (ref 135–145)

## 2011-11-03 LAB — HEMOGLOBIN A1C: Mean Plasma Glucose: 131 mg/dL — ABNORMAL HIGH (ref <117)

## 2011-11-05 NOTE — Progress Notes (Signed)
Quick Note:  Pt aware an verbally understood. ______

## 2011-11-07 ENCOUNTER — Telehealth: Payer: Self-pay | Admitting: Internal Medicine

## 2011-11-07 NOTE — Telephone Encounter (Signed)
[name] from [employer] and states at this time she doesn't need any assistant she has a Comptroller that helps her with transportation's.  The only thing that she feels that she might need is Life Alert.  Patient states she would like to talk with her daughter about this.  [name] said that she told patient she could call her back if she decides to take it and she can just come by and install it.

## 2011-11-09 ENCOUNTER — Ambulatory Visit (INDEPENDENT_AMBULATORY_CARE_PROVIDER_SITE_OTHER): Payer: Self-pay | Admitting: Internal Medicine

## 2011-11-09 ENCOUNTER — Encounter: Payer: Self-pay | Admitting: Internal Medicine

## 2011-11-09 DIAGNOSIS — R0989 Other specified symptoms and signs involving the circulatory and respiratory systems: Secondary | ICD-10-CM

## 2011-11-09 DIAGNOSIS — I4891 Unspecified atrial fibrillation: Secondary | ICD-10-CM

## 2011-11-09 LAB — POCT INR: INR: 1.9

## 2011-11-15 ENCOUNTER — Ambulatory Visit (INDEPENDENT_AMBULATORY_CARE_PROVIDER_SITE_OTHER): Payer: Self-pay | Admitting: Cardiology

## 2011-11-15 ENCOUNTER — Ambulatory Visit (INDEPENDENT_AMBULATORY_CARE_PROVIDER_SITE_OTHER): Payer: Medicare Other

## 2011-11-15 DIAGNOSIS — R0989 Other specified symptoms and signs involving the circulatory and respiratory systems: Secondary | ICD-10-CM

## 2011-11-15 DIAGNOSIS — J309 Allergic rhinitis, unspecified: Secondary | ICD-10-CM

## 2011-11-15 DIAGNOSIS — I4891 Unspecified atrial fibrillation: Secondary | ICD-10-CM

## 2011-11-15 LAB — POCT INR: INR: 3.2

## 2011-11-22 ENCOUNTER — Ambulatory Visit (INDEPENDENT_AMBULATORY_CARE_PROVIDER_SITE_OTHER): Payer: Medicare Other

## 2011-11-22 DIAGNOSIS — J309 Allergic rhinitis, unspecified: Secondary | ICD-10-CM

## 2011-11-29 ENCOUNTER — Ambulatory Visit (INDEPENDENT_AMBULATORY_CARE_PROVIDER_SITE_OTHER): Payer: Medicare Other

## 2011-11-29 ENCOUNTER — Ambulatory Visit (INDEPENDENT_AMBULATORY_CARE_PROVIDER_SITE_OTHER): Payer: Self-pay | Admitting: Internal Medicine

## 2011-11-29 DIAGNOSIS — I4891 Unspecified atrial fibrillation: Secondary | ICD-10-CM

## 2011-11-29 DIAGNOSIS — J309 Allergic rhinitis, unspecified: Secondary | ICD-10-CM

## 2011-11-29 LAB — POCT INR: INR: 2.9

## 2011-12-04 ENCOUNTER — Telehealth: Payer: Self-pay | Admitting: Internal Medicine

## 2011-12-04 DIAGNOSIS — J441 Chronic obstructive pulmonary disease with (acute) exacerbation: Secondary | ICD-10-CM

## 2011-12-04 DIAGNOSIS — I4891 Unspecified atrial fibrillation: Secondary | ICD-10-CM

## 2011-12-04 DIAGNOSIS — I10 Essential (primary) hypertension: Secondary | ICD-10-CM

## 2011-12-04 DIAGNOSIS — I509 Heart failure, unspecified: Secondary | ICD-10-CM

## 2011-12-04 MED ORDER — AMOXICILLIN-POT CLAVULANATE 500-125 MG PO TABS: 1.0000 | ORAL_TABLET | Freq: Two times a day (BID) | ORAL | Status: DC

## 2011-12-04 NOTE — Telephone Encounter (Signed)
I spoke with pt and is aware cdy recs. rx has been sent to the pharmacy and pt is aware of the directions. Nothing further was needed

## 2011-12-04 NOTE — Telephone Encounter (Signed)
Spoke with pt. She is c/o prod cough with large amounts of dark yellow to green sputum, also c/o fatigue > onset of symptoms was 3 days ago. Denies any changes in breathing, f/c/s, wheeze. Has appt with [name] on 12/06/11 and plans to keep this appt, but wants something called in in the meantime. Please advise, thanks! Allergies  Allergen Reactions  . Cephalexin     Unsure as to reaction.  ? Hives with Cephalexin; "I can take Penicillin"  . Atorvastatin     Forgetfulness   . Codeine Nausea And Vomiting  . Crestor (Rosuvastatin Calcium)     Forgetfulness    . Estradiol     Unknown reaction  . Hydrocortisone Hives    Flea bites were mistaken as rash from hydrocortisone  . Mevacor Other (See Comments)    forgetfullness,  . Moxifloxacin     Unknown reaction  . Sulfonamide Derivatives     itching

## 2011-12-04 NOTE — Telephone Encounter (Signed)
Per CY-okay to give Augmentin 500 mg #14 take 1 po bid no refills. 

## 2011-12-05 ENCOUNTER — Other Ambulatory Visit: Payer: Self-pay | Admitting: Internal Medicine

## 2011-12-06 ENCOUNTER — Ambulatory Visit (INDEPENDENT_AMBULATORY_CARE_PROVIDER_SITE_OTHER)
Admission: RE | Admit: 2011-12-06 | Discharge: 2011-12-06 | Disposition: A | Discharge status: Home / Self Care | Payer: Medicare Other | Source: Ambulatory Visit | Attending: Internal Medicine | Admitting: Internal Medicine

## 2011-12-06 ENCOUNTER — Ambulatory Visit (INDEPENDENT_AMBULATORY_CARE_PROVIDER_SITE_OTHER): Payer: Medicare Other | Admitting: Internal Medicine

## 2011-12-06 ENCOUNTER — Ambulatory Visit (INDEPENDENT_AMBULATORY_CARE_PROVIDER_SITE_OTHER): Payer: Medicare Other

## 2011-12-06 ENCOUNTER — Other Ambulatory Visit (INDEPENDENT_AMBULATORY_CARE_PROVIDER_SITE_OTHER): Payer: Medicare Other

## 2011-12-06 ENCOUNTER — Encounter: Payer: Self-pay | Admitting: Internal Medicine

## 2011-12-06 VITALS — BP 116/58 | HR 66 | Ht 62.5 in | Wt 131.0 lb

## 2011-12-06 DIAGNOSIS — J449 Chronic obstructive pulmonary disease, unspecified: Secondary | ICD-10-CM

## 2011-12-06 DIAGNOSIS — J441 Chronic obstructive pulmonary disease with (acute) exacerbation: Secondary | ICD-10-CM

## 2011-12-06 DIAGNOSIS — J189 Pneumonia, unspecified organism: Secondary | ICD-10-CM

## 2011-12-06 DIAGNOSIS — J309 Allergic rhinitis, unspecified: Secondary | ICD-10-CM

## 2011-12-06 LAB — CBC WITH DIFFERENTIAL/PLATELET
Basophils Absolute: 0 10*3/uL (ref 0.0–0.1)
Basophils Relative: 0.1 % (ref 0.0–3.0)
Eosinophils Absolute: 0 10*3/uL (ref 0.0–0.7)
Eosinophils Relative: 0.1 % (ref 0.0–5.0)
HCT: 37.3 % (ref 36.0–46.0)
Hemoglobin: 12.3 g/dL (ref 12.0–15.0)
Lymphocytes Relative: 5.5 % — ABNORMAL LOW (ref 12.0–46.0)
Lymphs Abs: 0.6 10*3/uL — ABNORMAL LOW (ref 0.7–4.0)
MCHC: 32.9 g/dL (ref 30.0–36.0)
MCV: 85.4 fl (ref 78.0–100.0)
Monocytes Absolute: 1 10*3/uL (ref 0.1–1.0)
Monocytes Relative: 8.7 % (ref 3.0–12.0)
Neutro Abs: 9.6 10*3/uL — ABNORMAL HIGH (ref 1.4–7.7)
Neutrophils Relative %: 85.6 % — ABNORMAL HIGH (ref 43.0–77.0)
Platelets: 159 10*3/uL (ref 150.0–400.0)
RBC: 4.37 Mil/uL (ref 3.87–5.11)
RDW: 15.3 % — ABNORMAL HIGH (ref 11.5–14.6)
WBC: 11.2 10*3/uL — ABNORMAL HIGH (ref 4.5–10.5)

## 2011-12-06 NOTE — Progress Notes (Signed)
Patient ID: [name], female    DOB: October 06, 1924, 78 y.o.   MRN: [id number]  HPI  07/23/2011-78 yoF former smoker followed for COPD, chronic hypoxic respiratory failure, complicated by atrial fibrillation, valvular heart disease and intermittent hemoptysis. She blames rain and pollen this week for nasal congestion. Minor epistaxis despite saline spray. Short of breath with her reading but that's not really different. Coughing a little scant yellow sputum, not bad. This happened about the time she changed to generic Singulair so she's not sure if it's a medication effect but does not feel as if she has cold. Continues oxygen at 3-3.5 L.  09/25/11- 78 yoF former smoker followed for COPD, chronic hypoxic respiratory failure, complicated by atrial fibrillation, valvular heart disease and intermittent hemoptysis Post hospital- 11/10-11/16/12- she was discharged with diagnoses COPD with exacerbation, acute on chronic respiratory failure on chronic oxygen, presumed community-acquired pneumonia, acute diastolic congestive heart failure, atrial fibrillation, history of aortic valve replacement, hypertension, history of coronary artery disease. She had had some blood in her sputum again. Chest x-ray had shown previous CABG and a right chest wall pacemaker, normal heart size, peripheral opacity within the right mid lung which was new with left pleural effusion and left lower lobe airspace consolidation. The concern was for multifocal pneumonia. She still feels especially unsteady if she tries to walk with her walker. There still is some blood streak in her sputum after treatment with broad-spectrum antibiotics in hospital. She expresses strong preference for brand-name Xopenex nebulizer solution rather than generic which she does not consider is effective.   10/25/11- 78 yoF former smoker followed for COPD, chronic hypoxic respiratory failure, complicated by atrial fibrillation, valvular heart disease and  intermittent hemoptysis After last hosp, she spent 4 weeks in rehab SNF. Feels much stronger. Some edema of feet. Last CT had indicated chronic pneumonia/ infiltrates- reviewed with her.  CT chest 09/25/11- IMPRESSION:  1. There are bilateral areas of peripheral and subpleural  consolidation within the right upper lobe, right middle lobe and  posterior medial left lung base. The appearance is nonspecific  favoring inflammatory or infectious etiologies. Underlying  malignancy cannot be excluded and interval follow-up is can be  necessary to ensure complete resolution. Recommend follow-up  imaging and 3-6 weeks after appropriate antibiotic therapy. The  study of choice would be a noncontrast CT of the chest.  2. Advanced emphysema.  Original Report Authenticated By: Rosealee Albee, M.D.   12/06/11- 78 yoF former smoker followed for COPD, chronic hypoxic respiratory failure, complicated by atrial fibrillation, valvular heart disease and intermittent hemoptysis   Daughter here She has not felt particularly in the last several days. We had sent Augmentin January 29 and she has taken about 4 doses. She is blowing her nose a lot, mostly clear mucus. Cough productive of yellow sputum now turning darker. Thinks she might have had some fever this morning with no chills. Has felt weak, somewhat nauseated. Describes a sharp pain intermittently in the left lower anterolateral chest and left upper quadrant of the abdomen, related to deep breath and movement but not to meals.  ROS-see HPI Constitutional:   No-   weight loss, night sweats,  ?fevers, chills,  +fatigue, lassitude. HEENT:   No-  headaches, difficulty swallowing, tooth/dental problems, sore throat,       No-  sneezing, itching, ear ache,   +nasal congestion, post nasal drip,  CV:  +  chest pain, orthopnea, PND, swelling in lower extremities, anasarca, dizziness, palpitations Resp: +  shortness of breath with exertion or at rest.              No-    productive cough,  No non-productive cough,  No- coughing up of blood.              + change in color of mucus.  No- wheezing.   Skin: No-   rash or lesions. GI:  No-   heartburn, indigestion, abdominal pain, nausea, vomiting, diarrhea,                 change in bowel habits, loss of appetite GU: No-   dysuria, change in color of urine, no urgency or frequency.  No- flank pain. MS:  No- acute  joint pain or swelling.  No- decreased range of motion.  No- back pain. Neuro-     nothing unusual Psych:  No- change in mood or affect. + depression or anxiety.  No memory loss.  Objective:   Physical ExamSATURATION QUALIFICATIONS: Patient Saturations on Room Air at Rest = 83% Patient Saturations on Room Air while Ambulating = 82% Patient Saturations on 3 Liters of oxygen while Ambulating = 92%---Katie Welchel,CMA General- Alert, Oriented, Affect-appropriate, Distress- none acute   O2 3 L/M, wheelchair. Talkative. Skin- rash-none, lesions- none, excoriation- none Lymphadenopathy- none Head- atraumatic            Eyes- Gross vision intact, PERRLA, conjunctivae clear secretions            Ears- Hearing, canals-normal for age            Nose- Clear, no-Septal dev, mucus, polyps, erosion, perforation             Throat- Mallampati II-III , mucosa clear , drainage- none, tonsils- atrophic Neck- flexible , trachea midline, no stridor , thyroid nl, carotid no bruit Chest - symmetrical excursion , unlabored           Heart/CV- RRR , 2-3/6 systolic LSB murmur , no gallop  , no rub, nl s1 s2                           - JVD- 1cm , edema- trace, stasis changes- none, varices- none           Lung- bilateral wheeze, cough- none , dullness-none, rub- none           Chest wall- not tender left lower ribs Abd- tender-no, distended-no, bowel sounds-present, HSM- no Br/ Gen/ Rectal- Not done, not indicated Extrem- cyanosis- none, clubbing, none, atrophy- none, strength- nl Neuro- grossly intact to  observation

## 2011-12-06 NOTE — Patient Instructions (Signed)
Order- CXR- COPD  Order- lab- CBC w/ diff  Ok to try otc Afrin nasal spray, used sparingly for nasal congestion if needed

## 2011-12-07 ENCOUNTER — Telehealth: Payer: Self-pay | Admitting: Internal Medicine

## 2011-12-07 MED ORDER — LEVOFLOXACIN 500 MG PO TABS: 500.0000 mg | ORAL_TABLET | Freq: Every day | ORAL | Status: DC

## 2011-12-07 NOTE — Assessment & Plan Note (Signed)
She never feels well anymore. This reflects a combination of her heart and lung disease and her age. We don't want to make it worse with medication side effects unless we see clear benefit. Plan chest x-ray, CBC, chemistry

## 2011-12-07 NOTE — Telephone Encounter (Signed)
CXR- edema changes and some scarring. The density in the upper right has not cleared and we will continue to follow this. I spoke with patient about results and he verbalized understanding and had no questions. Pt states she had a rough night last night. She states she is very SOB, she can't walk down the hall w/o help. She is on 3L oxygen continous and is still gasping for air. Pt states her oxygen level is 94% at rest. Pt is on prednisone 4mg  daily. Please advise Dr. Maple Hudson, thanks  Allergies  Allergen Reactions  . Cephalexin     Unsure as to reaction.  ? Hives with Cephalexin; "I can take Penicillin"  . Atorvastatin     Forgetfulness   . Codeine Nausea And Vomiting  . Crestor (Rosuvastatin Calcium)     Forgetfulness    . Estradiol     Unknown reaction  . Hydrocortisone Hives    Flea bites were mistaken as rash from hydrocortisone  . Mevacor Other (See Comments)    forgetfullness,  . Moxifloxacin     Unknown reaction  . Sulfonamide Derivatives     itching

## 2011-12-07 NOTE — Telephone Encounter (Signed)
ATC pt line ringing busy wcb

## 2011-12-07 NOTE — Telephone Encounter (Signed)
[name] called back and said that without CY office calling to get appt for mother that next available with Dr. Elease Hashimoto would be in May.  Please send request for appt over.  [phone]

## 2011-12-07 NOTE — Assessment & Plan Note (Signed)
Chest x-ray has shown some persistent density in the right mid lateral chest and bases. This is probably a mixture of scarring and edema with possible bronchopneumonia superimposed. A neoplastic process can't be excluded but with her general medical condition. We will watch conservatively for radiologic change.

## 2011-12-07 NOTE — Telephone Encounter (Signed)
Per CY-ok to increase O2 to 4L/M when needed esp with activity.

## 2011-12-07 NOTE — Telephone Encounter (Signed)
Dr. Maple Hudson, do you feel like this pt is okay to wait until May to see cards, or do we need to call and get her seen sooner. Please advise,thanks!

## 2011-12-07 NOTE — Telephone Encounter (Signed)
Spoke with [name] and notified of recs per [name]. She verbalized understanding and states nothing further needed. Will let us know how the pt is doing on Monday.

## 2011-12-07 NOTE — Telephone Encounter (Signed)
Per Cy-lets see how she feels on Monday

## 2011-12-07 NOTE — Telephone Encounter (Signed)
I  Have reviewed CXR images and labs. Since WBC stays up, we can try another antibiotic and see if we can clear out any infection to make a difference. Otherwise most of what we are seeing will be fluid congestion to discuss w/ he heart doctor.  Please suggest levaquin 500 mg # 7. I know she is intolerant of Avelox, but she may be able to tolerate this.

## 2011-12-07 NOTE — Telephone Encounter (Signed)
Spoke with pt and notified of results/recs per [name]. Pt verbalized understanding and states no questions. Rx was sent to pharm.

## 2011-12-07 NOTE — Telephone Encounter (Signed)
Pt's daughter, [name],  called.  Stated pt is confused & unsure of how to proceed in regards to the cardiologist that CY suggested earlier.  [name] stated she would like clarification of what mother was told earlier.  [name] can be reached at [phone].  [name]

## 2011-12-07 NOTE — Telephone Encounter (Signed)
lmomtcb x1 for Alinda Money. I spoke with [name] and explained to her what [name] had stated in the last message. She voiced her understanding. I also advised her if pt worsens then she needs to go to the ED to be evaluated. She voiced her understanding and had no further questions. She states she is going to call pt's cardiologists to see when they can get pt in to be seen

## 2011-12-07 NOTE — Telephone Encounter (Signed)
Called, spoke with pt.  I informed her [name] recs she increase o2 to 4 LPM when needed esp with activity.  She verbalized understanding of this.  Pt states breathing was so bad last night she almost went to the ED. She would like to know when she should start seeing some improvement.  Dr. Maple Hudson, pls advise.  Thanks!

## 2011-12-10 ENCOUNTER — Encounter (HOSPITAL_COMMUNITY): Payer: Self-pay | Admitting: *Deleted

## 2011-12-10 ENCOUNTER — Other Ambulatory Visit: Payer: Self-pay

## 2011-12-10 ENCOUNTER — Inpatient Hospital Stay (HOSPITAL_COMMUNITY)
Admission: EM | Admit: 2011-12-10 | Discharge: 2011-12-17 | DRG: 193 | Disposition: A | Discharge status: Skilled Nursing Facility | Payer: Medicare Other | Attending: Internal Medicine | Admitting: Internal Medicine

## 2011-12-10 ENCOUNTER — Emergency Department (HOSPITAL_COMMUNITY): Payer: Medicare Other

## 2011-12-10 ENCOUNTER — Telehealth: Payer: Self-pay | Admitting: Internal Medicine

## 2011-12-10 DIAGNOSIS — L299 Pruritus, unspecified: Secondary | ICD-10-CM

## 2011-12-10 DIAGNOSIS — R042 Hemoptysis: Secondary | ICD-10-CM

## 2011-12-10 DIAGNOSIS — R5381 Other malaise: Secondary | ICD-10-CM

## 2011-12-10 DIAGNOSIS — R42 Dizziness and giddiness: Secondary | ICD-10-CM

## 2011-12-10 DIAGNOSIS — I509 Heart failure, unspecified: Secondary | ICD-10-CM | POA: Diagnosis present

## 2011-12-10 DIAGNOSIS — Z7901 Long term (current) use of anticoagulants: Secondary | ICD-10-CM | POA: Diagnosis present

## 2011-12-10 DIAGNOSIS — Z87891 Personal history of nicotine dependence: Secondary | ICD-10-CM

## 2011-12-10 DIAGNOSIS — J449 Chronic obstructive pulmonary disease, unspecified: Secondary | ICD-10-CM

## 2011-12-10 DIAGNOSIS — R0602 Shortness of breath: Secondary | ICD-10-CM

## 2011-12-10 DIAGNOSIS — I2510 Atherosclerotic heart disease of native coronary artery without angina pectoris: Secondary | ICD-10-CM | POA: Diagnosis present

## 2011-12-10 DIAGNOSIS — J189 Pneumonia, unspecified organism: Principal | ICD-10-CM | POA: Diagnosis present

## 2011-12-10 DIAGNOSIS — R209 Unspecified disturbances of skin sensation: Secondary | ICD-10-CM

## 2011-12-10 DIAGNOSIS — I4891 Unspecified atrial fibrillation: Secondary | ICD-10-CM

## 2011-12-10 DIAGNOSIS — K589 Irritable bowel syndrome without diarrhea: Secondary | ICD-10-CM | POA: Diagnosis present

## 2011-12-10 DIAGNOSIS — J45901 Unspecified asthma with (acute) exacerbation: Secondary | ICD-10-CM | POA: Diagnosis present

## 2011-12-10 DIAGNOSIS — J301 Allergic rhinitis due to pollen: Secondary | ICD-10-CM

## 2011-12-10 DIAGNOSIS — IMO0002 Reserved for concepts with insufficient information to code with codable children: Secondary | ICD-10-CM

## 2011-12-10 DIAGNOSIS — D649 Anemia, unspecified: Secondary | ICD-10-CM

## 2011-12-10 DIAGNOSIS — Z9981 Dependence on supplemental oxygen: Secondary | ICD-10-CM

## 2011-12-10 DIAGNOSIS — K5900 Constipation, unspecified: Secondary | ICD-10-CM | POA: Diagnosis present

## 2011-12-10 DIAGNOSIS — Z9861 Coronary angioplasty status: Secondary | ICD-10-CM

## 2011-12-10 DIAGNOSIS — I350 Nonrheumatic aortic (valve) stenosis: Secondary | ICD-10-CM | POA: Diagnosis present

## 2011-12-10 DIAGNOSIS — G4730 Sleep apnea, unspecified: Secondary | ICD-10-CM

## 2011-12-10 DIAGNOSIS — Z79899 Other long term (current) drug therapy: Secondary | ICD-10-CM

## 2011-12-10 DIAGNOSIS — K219 Gastro-esophageal reflux disease without esophagitis: Secondary | ICD-10-CM | POA: Diagnosis present

## 2011-12-10 DIAGNOSIS — J441 Chronic obstructive pulmonary disease with (acute) exacerbation: Secondary | ICD-10-CM | POA: Diagnosis present

## 2011-12-10 DIAGNOSIS — L608 Other nail disorders: Secondary | ICD-10-CM

## 2011-12-10 DIAGNOSIS — Z951 Presence of aortocoronary bypass graft: Secondary | ICD-10-CM

## 2011-12-10 DIAGNOSIS — I4892 Unspecified atrial flutter: Secondary | ICD-10-CM | POA: Diagnosis present

## 2011-12-10 DIAGNOSIS — J9610 Chronic respiratory failure, unspecified whether with hypoxia or hypercapnia: Secondary | ICD-10-CM | POA: Diagnosis present

## 2011-12-10 DIAGNOSIS — I5033 Acute on chronic diastolic (congestive) heart failure: Secondary | ICD-10-CM | POA: Diagnosis present

## 2011-12-10 DIAGNOSIS — J209 Acute bronchitis, unspecified: Secondary | ICD-10-CM

## 2011-12-10 DIAGNOSIS — M810 Age-related osteoporosis without current pathological fracture: Secondary | ICD-10-CM | POA: Diagnosis present

## 2011-12-10 DIAGNOSIS — K5289 Other specified noninfective gastroenteritis and colitis: Secondary | ICD-10-CM

## 2011-12-10 DIAGNOSIS — I951 Orthostatic hypotension: Secondary | ICD-10-CM

## 2011-12-10 DIAGNOSIS — Z952 Presence of prosthetic heart valve: Secondary | ICD-10-CM

## 2011-12-10 DIAGNOSIS — M531 Cervicobrachial syndrome: Secondary | ICD-10-CM

## 2011-12-10 DIAGNOSIS — R142 Eructation: Secondary | ICD-10-CM

## 2011-12-10 DIAGNOSIS — Z950 Presence of cardiac pacemaker: Secondary | ICD-10-CM | POA: Diagnosis present

## 2011-12-10 DIAGNOSIS — M129 Arthropathy, unspecified: Secondary | ICD-10-CM | POA: Diagnosis present

## 2011-12-10 DIAGNOSIS — I10 Essential (primary) hypertension: Secondary | ICD-10-CM | POA: Diagnosis present

## 2011-12-10 LAB — URINALYSIS, ROUTINE W REFLEX MICROSCOPIC
Bilirubin Urine: NEGATIVE
Glucose, UA: NEGATIVE mg/dL
Hgb urine dipstick: NEGATIVE
Ketones, ur: 15 mg/dL — AB
Leukocytes, UA: NEGATIVE
Nitrite: NEGATIVE
Protein, ur: 30 mg/dL — AB
Specific Gravity, Urine: 1.028 (ref 1.005–1.030)
Urobilinogen, UA: 1 mg/dL (ref 0.0–1.0)
pH: 6.5 (ref 5.0–8.0)

## 2011-12-10 LAB — URINE MICROSCOPIC-ADD ON

## 2011-12-10 LAB — DIFFERENTIAL
Basophils Absolute: 0 10*3/uL (ref 0.0–0.1)
Basophils Relative: 0 % (ref 0–1)
Eosinophils Absolute: 0 10*3/uL (ref 0.0–0.7)
Eosinophils Relative: 0 % (ref 0–5)
Lymphocytes Relative: 11 % — ABNORMAL LOW (ref 12–46)
Lymphs Abs: 1 10*3/uL (ref 0.7–4.0)
Monocytes Absolute: 0.9 10*3/uL (ref 0.1–1.0)
Monocytes Relative: 9 % (ref 3–12)
Neutro Abs: 7.4 10*3/uL (ref 1.7–7.7)
Neutrophils Relative %: 80 % — ABNORMAL HIGH (ref 43–77)

## 2011-12-10 LAB — PROTIME-INR
INR: 1.97 — ABNORMAL HIGH (ref 0.00–1.49)
INR: 1.91 — ABNORMAL HIGH (ref 0.00–1.49)
Prothrombin Time: 22.8 seconds — ABNORMAL HIGH (ref 11.6–15.2)

## 2011-12-10 LAB — BASIC METABOLIC PANEL
BUN: 15 mg/dL (ref 6–23)
CO2: 30 mEq/L (ref 19–32)
Calcium: 9.7 mg/dL (ref 8.4–10.5)
Creatinine, Ser: 0.57 mg/dL (ref 0.50–1.10)
Glucose, Bld: 122 mg/dL — ABNORMAL HIGH (ref 70–99)

## 2011-12-10 LAB — DIGOXIN LEVEL: Digoxin Level: 0.6 ng/mL — ABNORMAL LOW (ref 0.8–2.0)

## 2011-12-10 LAB — CBC
HCT: 38.4 % (ref 36.0–46.0)
Hemoglobin: 12.5 g/dL (ref 12.0–15.0)
MCH: 27.2 pg (ref 26.0–34.0)
MCHC: 32.6 g/dL (ref 30.0–36.0)
MCV: 83.7 fL (ref 78.0–100.0)
Platelets: 204 10*3/uL (ref 150–400)
RBC: 4.59 MIL/uL (ref 3.87–5.11)
RDW: 14.6 % (ref 11.5–15.5)
WBC: 9.2 10*3/uL (ref 4.0–10.5)

## 2011-12-10 LAB — TROPONIN I: Troponin I: <0.3 ng/mL (ref <0.30)

## 2011-12-10 LAB — PRO B NATRIURETIC PEPTIDE: Pro B Natriuretic peptide (BNP): 501.6 pg/mL — ABNORMAL HIGH (ref 0–450)

## 2011-12-10 MED ORDER — PIPERACILLIN-TAZOBACTAM 3.375 G IVPB
3.3750 g | Freq: Three times a day (TID) | INTRAVENOUS | Status: DC
  Administered 2011-12-11 – 2011-12-14 (×11): 3.375 g via INTRAVENOUS
  Filled 2011-12-10 (×12): qty 50

## 2011-12-10 MED ORDER — VANCOMYCIN HCL 500 MG IV SOLR
500.0000 mg | Freq: Two times a day (BID) | INTRAVENOUS | Status: DC
  Administered 2011-12-11 – 2011-12-13 (×6): 500 mg via INTRAVENOUS
  Filled 2011-12-10 (×7): qty 500

## 2011-12-10 MED ORDER — SERTRALINE HCL 50 MG PO TABS
50.0000 mg | ORAL_TABLET | Freq: Every day | ORAL | Status: DC
  Administered 2011-12-10 – 2011-12-17 (×8): 50 mg via ORAL
  Filled 2011-12-10 (×8): qty 1

## 2011-12-10 MED ORDER — MAGIC MOUTHWASH
5.0000 mL | Freq: Four times a day (QID) | ORAL | Status: DC | PRN
  Administered 2011-12-10 – 2011-12-15 (×6): 5 mL via ORAL
  Filled 2011-12-10 (×9): qty 5

## 2011-12-10 MED ORDER — POTASSIUM CHLORIDE CRYS ER 20 MEQ PO TBCR
20.0000 meq | EXTENDED_RELEASE_TABLET | Freq: Two times a day (BID) | ORAL | Status: DC
  Administered 2011-12-10 – 2011-12-17 (×14): 20 meq via ORAL
  Filled 2011-12-10 (×15): qty 1

## 2011-12-10 MED ORDER — IPRATROPIUM BROMIDE 0.02 % IN SOLN
0.5000 mg | Freq: Once | RESPIRATORY_TRACT | Status: AC
  Administered 2011-12-10: 0.5 mg via RESPIRATORY_TRACT
  Filled 2011-12-10: qty 2.5

## 2011-12-10 MED ORDER — DIGOXIN 125 MCG PO TABS
125.0000 ug | ORAL_TABLET | Freq: Every day | ORAL | Status: DC
  Administered 2011-12-10 – 2011-12-17 (×8): 125 ug via ORAL
  Filled 2011-12-10 (×9): qty 1

## 2011-12-10 MED ORDER — EZETIMIBE 10 MG PO TABS
10.0000 mg | ORAL_TABLET | Freq: Every day | ORAL | Status: DC
  Administered 2011-12-11 – 2011-12-17 (×7): 10 mg via ORAL
  Filled 2011-12-10 (×8): qty 1

## 2011-12-10 MED ORDER — METHYLPREDNISOLONE SODIUM SUCC 125 MG IJ SOLR
60.0000 mg | Freq: Four times a day (QID) | INTRAMUSCULAR | Status: DC
  Administered 2011-12-11 – 2011-12-12 (×7): 60 mg via INTRAVENOUS
  Filled 2011-12-10 (×8): qty 0.96

## 2011-12-10 MED ORDER — DIPHENHYDRAMINE HCL 12.5 MG/5ML PO LIQD
12.5000 mg | Freq: Every day | ORAL | Status: DC
  Administered 2011-12-11: 12.5 mg via ORAL
  Filled 2011-12-10 (×9): qty 5

## 2011-12-10 MED ORDER — VANCOMYCIN HCL IN DEXTROSE 1-5 GM/200ML-% IV SOLN
1000.0000 mg | Freq: Once | INTRAVENOUS | Status: AC
  Administered 2011-12-10: 1000 mg via INTRAVENOUS
  Filled 2011-12-10: qty 200

## 2011-12-10 MED ORDER — EZETIMIBE 10 MG PO TABS
10.0000 mg | ORAL_TABLET | Freq: Once | ORAL | Status: AC
  Administered 2011-12-10: 10 mg via ORAL
  Filled 2011-12-10: qty 1

## 2011-12-10 MED ORDER — DILTIAZEM HCL ER COATED BEADS 240 MG PO CP24
240.0000 mg | ORAL_CAPSULE | Freq: Every day | ORAL | Status: DC
  Administered 2011-12-11 – 2011-12-17 (×7): 240 mg via ORAL
  Filled 2011-12-10 (×7): qty 1

## 2011-12-10 MED ORDER — ALBUTEROL SULFATE (5 MG/ML) 0.5% IN NEBU
5.0000 mg | INHALATION_SOLUTION | Freq: Once | RESPIRATORY_TRACT | Status: AC
  Administered 2011-12-10: 5 mg via RESPIRATORY_TRACT
  Filled 2011-12-10: qty 1

## 2011-12-10 MED ORDER — MONTELUKAST SODIUM 10 MG PO TABS
10.0000 mg | ORAL_TABLET | Freq: Every day | ORAL | Status: DC
  Administered 2011-12-10 – 2011-12-16 (×7): 10 mg via ORAL
  Filled 2011-12-10 (×8): qty 1

## 2011-12-10 MED ORDER — LEVALBUTEROL HCL 0.63 MG/3ML IN NEBU
0.6300 mg | INHALATION_SOLUTION | RESPIRATORY_TRACT | Status: DC
  Administered 2011-12-10 – 2011-12-12 (×10): 0.63 mg via RESPIRATORY_TRACT
  Filled 2011-12-10 (×16): qty 3

## 2011-12-10 MED ORDER — IPRATROPIUM BROMIDE 0.02 % IN SOLN
0.5000 mg | RESPIRATORY_TRACT | Status: DC
  Administered 2011-12-10 – 2011-12-12 (×10): 0.5 mg via RESPIRATORY_TRACT
  Filled 2011-12-10 (×11): qty 2.5

## 2011-12-10 MED ORDER — FUROSEMIDE 20 MG PO TABS
20.0000 mg | ORAL_TABLET | Freq: Two times a day (BID) | ORAL | Status: DC
  Administered 2011-12-10 – 2011-12-17 (×14): 20 mg via ORAL
  Filled 2011-12-10 (×16): qty 1

## 2011-12-10 MED ORDER — METHYLPREDNISOLONE SODIUM SUCC 125 MG IJ SOLR
125.0000 mg | Freq: Once | INTRAMUSCULAR | Status: AC
  Administered 2011-12-10: 125 mg via INTRAVENOUS
  Filled 2011-12-10: qty 2

## 2011-12-10 MED ORDER — PIPERACILLIN-TAZOBACTAM 3.375 G IVPB
3.3750 g | Freq: Once | INTRAVENOUS | Status: AC
  Administered 2011-12-10: 3.375 g via INTRAVENOUS
  Filled 2011-12-10: qty 50

## 2011-12-10 MED ORDER — WARFARIN SODIUM 1 MG PO TABS
1.0000 mg | ORAL_TABLET | ORAL | Status: AC
  Administered 2011-12-10: 1 mg via ORAL
  Filled 2011-12-10 (×2): qty 1

## 2011-12-10 MED ORDER — SOTALOL HCL 80 MG PO TABS
80.0000 mg | ORAL_TABLET | Freq: Two times a day (BID) | ORAL | Status: DC
  Administered 2011-12-10 – 2011-12-17 (×14): 80 mg via ORAL
  Filled 2011-12-10 (×15): qty 1

## 2011-12-10 MED ORDER — MOMETASONE FURO-FORMOTEROL FUM 100-5 MCG/ACT IN AERO
2.0000 | INHALATION_SPRAY | Freq: Two times a day (BID) | RESPIRATORY_TRACT | Status: DC
  Administered 2011-12-12 – 2011-12-16 (×9): 2 via RESPIRATORY_TRACT
  Filled 2011-12-10: qty 13

## 2011-12-10 MED ORDER — WARFARIN 0.5 MG HALF TABLET: 0.5000 mg | ORAL_TABLET | Freq: Every day | ORAL | Status: DC

## 2011-12-10 MED ORDER — SODIUM CHLORIDE 0.9 % IJ SOLN
3.0000 mL | Freq: Two times a day (BID) | INTRAMUSCULAR | Status: DC
  Administered 2011-12-10 – 2011-12-17 (×11): 3 mL via INTRAVENOUS

## 2011-12-10 NOTE — ED Provider Notes (Signed)
History     CSN: [id number]  Arrival date & time 12/10/11  1241   First MD Initiated Contact with Patient 12/10/11 1302      Chief Complaint  Patient presents with  . Shortness of Breath    (Consider location/radiation/quality/duration/timing/severity/associated sxs/prior treatment) HPI Comments: Patient reports she has been treated outpatient for pneumonia by her pulmonologist Dr Maple Hudson, first with Augmentin, then with Levaquin.  States she was advised by Dr Maple Hudson to come to the ED as she is not improving.  States she has been short of breath, with cough occasionally productive of green sputum, weakness.  Has had isolated episodes of sharp chest pain around her bilateral lower ribs.  Patient has history of COPD on 3L home oxygen, CAD.    The history is provided by the patient and a relative.    Past Medical History  Diagnosis Date  . Asthmatic bronchitis   . Allergic rhinitis   . Atrial fibrillation   . History of aortic valve replacement Dr. Tyrone Sage 2006    Has severe residual gradient and will be managed conservatively  . Sleep apnea   . Chronic obstructive asthma   . Osteoporosis   . Pruritic disorder   . Diverticulosis   . Hemorrhoids   . GERD (gastroesophageal reflux disease)   . IBS (irritable bowel syndrome)   . Pacemaker 2010  . Oxygen dependent   . Atrial flutter     on Betapace  . High risk medication use     on Betapace & Coumadin  . Chronic anticoagulation   . COPD (chronic obstructive pulmonary disease)   . Pneumonia   . Anemia   . Arthritis   . Hypertension   . Coronary artery disease   . Pneumonia 09/2011    in NH for Rehab post discharge until 12/18    Past Surgical History  Procedure Date  . Back surgery 1994  . Tonsillectomy   . Appendectomy   . Ganglion cyst excision   . Vocal cord polyps 1975 and 1984  . Aortic valve replacement 2006    Bioprosthetic  . Pacemaker insertion 2010  . Cardiac electrophysiology mapping and ablation   .  Coronary artery bypass graft   . Insert / replace / remove pacemaker   . Eye surgery   . Cardiac catheterization   . Coronary angioplasty   . Cardiac valve replacement     Family History  Problem Relation Age of Onset  . Kidney failure Father   . Colon cancer Paternal Aunt   . Heart disease Maternal Grandfather   . Heart attack Paternal Grandfather   . Breast cancer Daughter   . Stroke Maternal Grandmother   . Heart disease Mother     History  Substance Use Topics  . Smoking status: Former Smoker -- 1.0 packs/day for 50 years    Types: Cigarettes    Quit date: 11/05/1993  . Smokeless tobacco: Never Used  . Alcohol Use: No    OB History    Grav Para Term Preterm Abortions TAB SAB Ect Mult Living                  Review of Systems  All other systems reviewed and are negative.    Allergies  Cephalexin; Atorvastatin; Codeine; Crestor; Estradiol; Hydrocortisone; Mevacor; Moxifloxacin; and Sulfonamide derivatives  Home Medications   Current Outpatient Rx  Name Route Sig Dispense Refill  . MAGIC MOUTHWASH Oral Take 5 mLs by mouth 4 (four) times daily as needed.  For mouth irritations     . AMOXICILLIN-POT CLAVULANATE 500-125 MG PO TABS Oral Take 1 tablet (500 mg total) by mouth 2 (two) times daily. 14 tablet 0  . DIGOXIN 0.125 MG PO TABS Oral Take 1 tablet (125 mcg total) by mouth daily. 30 tablet 6  . DILTIAZEM HCL ER COATED BEADS 240 MG PO CP24 Oral Take 1 capsule (240 mg total) by mouth daily. 30 capsule 6  . DIPHENHYDRAMINE HCL 12.5 MG/5ML PO LIQD Oral Take 12.5 mg by mouth at bedtime.     . STOOL SOFTENER PO Oral Take 100 mg by mouth daily.     . DULERA 200-5 MCG/ACT IN AERO  USE 2 PUFFS TWO TIMES A DAY AS NEEDED 1 Inhaler 6  . EZETIMIBE 10 MG PO TABS Oral Take 1 tablet (10 mg total) by mouth daily. 30 tablet 5  . FUROSEMIDE 40 MG PO TABS Oral Take 20 mg by mouth 2 (two) times daily.      . GUAIFENESIN ER 1200 MG PO TB12 Oral Take 1,200 mg by mouth 2 (two) times  daily at 10 AM and 5 PM.      . IRON PO Oral Take 1 tablet by mouth daily.      Marland Kitchen LEVALBUTEROL TARTRATE 45 MCG/ACT IN AERO Inhalation Inhale 2 puffs into the lungs every 6 (six) hours as needed. For shortness of breath    . LEVALBUTEROL HCL 1.25 MG/3ML IN NEBU Nebulization Take 1.25 mg by nebulization 3 (three) times daily. DX:  493.20 216 mL 3  . LEVOFLOXACIN 500 MG PO TABS Oral Take 1 tablet (500 mg total) by mouth daily. 7 tablet 0  . METHYLPREDNISOLONE 4 MG PO TABS Oral Take 1 tablet (4 mg total) by mouth daily. Resume after completing the dose pack.    Marland Kitchen MONTELUKAST SODIUM 10 MG PO TABS  TAKE 1 TABLET DAILY FOR ASTHMA 30 tablet 2  . NEXIUM 40 MG PO CPDR  TAKE 1 TABLET BY MOUTH ONCE A DAY 90 capsule 2  . POTASSIUM CHLORIDE CRYS ER 20 MEQ PO TBCR Oral Take 20 mEq by mouth 2 (two) times daily.      . SERTRALINE HCL 50 MG PO TABS Oral Take 1 tablet (50 mg total) by mouth daily. 30 tablet 3  . SERTRALINE HCL 50 MG PO TABS  TAKE 1 AND 1/2 TABLETS AT BEDTIME 45 tablet 2  . SOTALOL HCL 80 MG PO TABS Oral Take 1 tablet (80 mg total) by mouth 2 (two) times daily. 60 tablet 5  . SPIRIVA HANDIHALER 18 MCG IN CAPS  INHALE CONTENTS OF 1 CAPSULE ONCE A DAY 30 each 5  . WARFARIN SODIUM 1 MG PO TABS Oral Take 0.5-1 mg by mouth daily. Take 0.5 tablet on Tuesday & Thursday Take 1 tablet on all other days      BP 121/61  Pulse 65  Temp(Src) 97.1 F (36.2 C) (Oral)  Resp 22  Ht 5\' 2"  (1.575 m)  Wt 131 lb (59.421 kg)  BMI 23.96 kg/m2  SpO2 90%  Physical Exam  Nursing note and vitals reviewed. Constitutional: She is oriented to person, place, and time. She appears well-developed and well-nourished.  HENT:  Head: Normocephalic and atraumatic.  Neck: Neck supple.  Cardiovascular: Normal rate, regular rhythm and normal heart sounds.   Pulmonary/Chest: No respiratory distress. She has decreased breath sounds. She has wheezes. She has no rales. She exhibits no tenderness.       Decreased breath sounds  with expiratory  wheezing.  Patient speaking easily in full sentences.    Abdominal: Soft. Bowel sounds are normal. There is no tenderness.  Neurological: She is alert and oriented to person, place, and time.    ED Course  Procedures (including critical care time)  Labs Reviewed  DIFFERENTIAL - Abnormal; Notable for the following:    Neutrophils Relative 80 (*)    Lymphocytes Relative 11 (*)    All other components within normal limits  BASIC METABOLIC PANEL - Abnormal; Notable for the following:    Glucose, Bld 122 (*)    GFR calc non Af Amer 82 (*)    All other components within normal limits  URINALYSIS, ROUTINE W REFLEX MICROSCOPIC - Abnormal; Notable for the following:    Ketones, ur 15 (*)    Protein, ur 30 (*)    All other components within normal limits  PROTIME-INR - Abnormal; Notable for the following:    Prothrombin Time 22.8 (*)    INR 1.97 (*)    All other components within normal limits  URINE MICROSCOPIC-ADD ON - Abnormal; Notable for the following:    Squamous Epithelial / LPF FEW (*)    All other components within normal limits  CBC  TROPONIN I  DIGOXIN LEVEL   Dg Chest 2 View  12/10/2011  *RADIOLOGY REPORT*  Clinical Data: Weakness and shortness of breath.  CHEST - 2 VIEW  Comparison: Chest x-ray 12/06/2011.  Findings: The cardiac silhouette, mediastinal and hilar contours are within normal limits and stable.  Stable surgical changes from bypass surgery.  The pacer wires are stable. Stable right upper lobe opacity, likely scar.  There are persistent but slight improving infiltrates.  No definite pleural effusions or pneumothorax. Underlying emphysematous changes are again demonstrated.  IMPRESSION:  1.  Slight improved lung aeration with some persistent patchy infiltrates. 2.  Stable right upper lobe lung opacity. 3.  Stable emphysematous changes.  Original Report Authenticated By: P. Loralie Champagne, M.D.    1:05 PM Patient not yet in exam room.   2:27 PM Patient  seen and examined.  Declines nebulizer treatment at this time.   Patient discussed with Dr Patria Mane who has also seen the patient.     Date: 12/10/2011  Rate: 65  Rhythm: normal sinus rhythm  QRS Axis: normal  Intervals: normal  ST/T Wave abnormalities: nonspecific T wave changes  Conduction Disutrbances:none  Narrative Interpretation:   Old EKG Reviewed: unchanged    1. CAP (community acquired pneumonia)   2. COPD (chronic obstructive pulmonary disease)       MDM  Elderly nontoxic afebrile patient with continued pneumonia on CXR despite outpatient treatment with Augmentin and Levaquin.  Patient also likely has COPD exacerbation, patient is on low dose prednisone at home, given increased dose.  Admitted to the hospital by Dr Patria Mane for failed outpatient treatment of CAP/COPD.         Dillard Cannon [place], [place] 12/10/11 [phone]

## 2011-12-10 NOTE — Progress Notes (Signed)
DAWANDA MAPEL [id number] Admission Data: 12/10/2011 6:45 PM Attending Provider: No att. providers found  WUJ:[id number] Alwyn Ren, MD, MD Consults/ Treatment Team:    CHASMINE LENDER is a 78 y.o. female patient admitted from ED awake, alert  & orientated  X 3,  Full Code, VSS - Blood pressure 144/75, pulse 64, temperature 97.7 F (36.5 C), temperature source Oral, resp. rate 18, height 5' 2.5" (1.588 m), weight 59.5 kg (131 lb 2.8 oz), SpO2 95.00%., O2    3 L nasal cannular, no c/o shortness of breath, no c/o chest pain, no distress noted. Tele # [id number] placed and pt is currently running A-Paced   IV site WDL:  antecubital right, condition patent and no redness with a transparent dsg that's clean dry and intact.  Allergies:   Allergies  Allergen Reactions  . Cephalexin     Unsure as to reaction.  ? Hives with Cephalexin; "I can take Penicillin"  . Atorvastatin     Forgetfulness   . Codeine Nausea And Vomiting  . Crestor (Rosuvastatin Calcium)     Forgetfulness    . Estradiol     Unknown reaction  . Hydrocortisone Hives    Flea bites were mistaken as rash from hydrocortisone  . Mevacor Other (See Comments)    forgetfullness,  . Moxifloxacin     Unknown reaction  . Sulfonamide Derivatives     itching     Past Medical History  Diagnosis Date  . Asthmatic bronchitis   . Allergic rhinitis   . Atrial fibrillation   . History of aortic valve replacement Dr. Tyrone Sage 2006    Has severe residual gradient and will be managed conservatively  . Sleep apnea   . Chronic obstructive asthma   . Osteoporosis   . Pruritic disorder   . Diverticulosis   . Hemorrhoids   . GERD (gastroesophageal reflux disease)   . IBS (irritable bowel syndrome)   . Pacemaker 2010  . Oxygen dependent   . Atrial flutter     on Betapace  . High risk medication use     on Betapace & Coumadin  . Chronic anticoagulation   . COPD (chronic obstructive pulmonary disease)   . Pneumonia   . Anemia   .  Arthritis   . Hypertension   . Coronary artery disease   . Pneumonia 09/2011    in NH for Rehab post discharge until 12/18    History:  obtained from the patient. Tobacco/alcohol: Smoked 1 packs per day for 50 years none  Pt orientation to unit, room and routine. Information packet given to patient/family and safety video watched.  Admission INP armband ID verified with patient/family, and in place. SR up x 2, fall risk assessment complete with Patient and family verbalizing understanding of risks associated with falls. Pt verbalizes an understanding of how to use the call bell and to call for help before getting out of bed.  Skin, clean-dry- intact without evidence of bruising, or skin tears.   No evidence of skin break down noted on exam.     Will cont to monitor and assist as needed.  Alylah Blakney Consuella Lose, RN 12/10/2011 6:45 PM

## 2011-12-10 NOTE — ED Notes (Signed)
PA at bedside.

## 2011-12-10 NOTE — Telephone Encounter (Signed)
I returned call to [facility]. Discussed Hospice vs SNF vs acute admission. Suggested dtr call Dr Harvie Bridge office to update, and call Hospice to learn about their services.

## 2011-12-10 NOTE — ED Notes (Signed)
Patient transported to X-ray 

## 2011-12-10 NOTE — Telephone Encounter (Signed)
I spoke with daughter states pt is getting weaker, very SOB (only walks 10 feet and is very SOB) x Friday. Daughter states she is so weak she can't even push her inhaler down to use it. She states it takes pt 30 minutes to catch her breathe. I advised daughter if she is that SOB and that weak then they should take her to the ED. She states she will talk with the pt to see which ED pt would prefer to be taken to. Daughter is also asking to speak w/ [name] directly to discuss pt health and guidance for future in regard to pt. Please advise Dr. Maple Hudson, thanks

## 2011-12-10 NOTE — Progress Notes (Signed)
ANTICOAGULATION CONSULT NOTE - Initial Consult  Pharmacy Consult for coumadin Indication: atrial fibrillation  Allergies  Allergen Reactions  . Cephalexin     Unsure as to reaction.  ? Hives with Cephalexin; "I can take Penicillin"  . Atorvastatin     Forgetfulness   . Codeine Nausea And Vomiting  . Crestor (Rosuvastatin Calcium)     Forgetfulness    . Estradiol     Unknown reaction  . Hydrocortisone Hives    Flea bites were mistaken as rash from hydrocortisone  . Mevacor Other (See Comments)    forgetfullness,  . Moxifloxacin     Unknown reaction  . Sulfonamide Derivatives     itching    Patient Measurements: Height: 5\' 2"  (157.5 cm) Weight: 131 lb (59.421 kg) IBW/kg (Calculated) : 50.1   Vital Signs: Temp: 97.1 F (36.2 C) (02/04 1242) Temp src: Oral (02/04 1242) BP: 131/58 mmHg (02/04 1644) Pulse Rate: 65  (02/04 1644)  Labs:  Basename 12/10/11 1437 12/10/11 1436  HGB 12.5 --  HCT 38.4 --  PLT 204 --  APTT -- --  LABPROT -- 22.8*  INR -- 1.97*  HEPARINUNFRC -- --  CREATININE -- 0.57  CKTOTAL -- --  CKMB -- --  TROPONINI <0.30 --   Estimated Creatinine Clearance: 40.7 ml/min (by C-G formula based on Cr of 0.57).  Medical History: Past Medical History  Diagnosis Date  . Asthmatic bronchitis   . Allergic rhinitis   . Atrial fibrillation   . History of aortic valve replacement Dr. Tyrone Sage 2006    Has severe residual gradient and will be managed conservatively  . Sleep apnea   . Chronic obstructive asthma   . Osteoporosis   . Pruritic disorder   . Diverticulosis   . Hemorrhoids   . GERD (gastroesophageal reflux disease)   . IBS (irritable bowel syndrome)   . Pacemaker 2010  . Oxygen dependent   . Atrial flutter     on Betapace  . High risk medication use     on Betapace & Coumadin  . Chronic anticoagulation   . COPD (chronic obstructive pulmonary disease)   . Pneumonia   . Anemia   . Arthritis   . Hypertension   . Coronary artery  disease   . Pneumonia 09/2011    in NH for Rehab post discharge until 12/18    Medications:  Medications Prior to Admission  Medication Dose Route Frequency Provider Last Rate Last Dose  . albuterol (PROVENTIL) (5 MG/ML) 0.5% nebulizer solution 5 mg  5 mg Nebulization Once Lyanne Co, MD   5 mg at 12/10/11 1645  . ipratropium (ATROVENT) nebulizer solution 0.5 mg  0.5 mg Nebulization Once Lyanne Co, MD   0.5 mg at 12/10/11 1645  . methylPREDNISolone sodium succinate (SOLU-MEDROL) 125 MG injection 125 mg  125 mg Intravenous Once Lyanne Co, MD   125 mg at 12/10/11 1645  . piperacillin-tazobactam (ZOSYN) IVPB 3.375 g  3.375 g Intravenous Once Lyanne Co, MD   3.375 g at 12/10/11 1646  . vancomycin (VANCOCIN) IVPB 1000 mg/200 mL premix  1,000 mg Intravenous Once Lyanne Co, MD   1,000 mg at 12/10/11 1714  . warfarin (COUMADIN) tablet 1 mg  1 mg Oral To Major Drake Leach Taqwa Deem, PHARMD       Medications Prior to Admission  Medication Sig Dispense Refill  . Alum & Mag Hydroxide-Simeth (MAGIC MOUTHWASH) SOLN Take 5 mLs by mouth 4 (four) times daily as needed.  For mouth irritations       . digoxin (LANOXIN) 0.125 MG tablet Take 125 mcg by mouth daily.      Marland Kitchen diltiazem (CARDIZEM CD) 240 MG 24 hr capsule Take 240 mg by mouth daily.      . diphenhydrAMINE (CHILDRENS ALLERGY) 12.5 MG/5ML liquid Take 12.5 mg by mouth at bedtime.       [name] Calcium (STOOL SOFTENER PO) Take 100 mg by mouth daily.       Marland Kitchen ezetimibe (ZETIA) 10 MG tablet Take 10 mg by mouth daily.      . furosemide (LASIX) 40 MG tablet Take 20 mg by mouth 2 (two) times daily.        . Guaifenesin (MUCINEX MAXIMUM STRENGTH) 1200 MG TB12 Take 1,200 mg by mouth 2 (two) times daily at 10 AM and 5 PM.        . IRON PO Take 1 tablet by mouth daily.        Marland Kitchen levalbuterol (XOPENEX HFA) 45 MCG/ACT inhaler Inhale 2 puffs into the lungs 2 (two) times daily.       Marland Kitchen levalbuterol (XOPENEX) 1.25 MG/3ML nebulizer solution  Take 1 ampule by nebulization 3 (three) times daily. DX:  493.20      . levofloxacin (LEVAQUIN) 500 MG tablet Take 500 mg by mouth daily. 7 day course started on 12-07-11 for  URI.      . methylPREDNISolone (MEDROL) 4 MG tablet Take 4 mg by mouth daily. Resume after completing the dose pack.      . potassium chloride SA (K-DUR,KLOR-CON) 20 MEQ tablet Take 20 mEq by mouth 2 (two) times daily.       . sertraline (ZOLOFT) 50 MG tablet Take 50 mg by mouth daily.      . sotalol (BETAPACE) 80 MG tablet Take 80 mg by mouth 2 (two) times daily.      Marland Kitchen warfarin (COUMADIN) 1 MG tablet Take 0.5-1 mg by mouth daily. Take 0.5 tablet on Tuesday & Thursday Take 1 tablet on all other days      . DISCONTD: ezetimibe (ZETIA) 10 MG tablet Take 1 tablet (10 mg total) by mouth daily.  30 tablet  5  . DISCONTD: levalbuterol (XOPENEX) 1.25 MG/3ML nebulizer solution Take 1.25 mg by nebulization 3 (three) times daily. DX:  493.20  216 mL  3  . DISCONTD: sertraline (ZOLOFT) 50 MG tablet Take 1 tablet (50 mg total) by mouth daily.  30 tablet  3  . DISCONTD: sertraline (ZOLOFT) 50 MG tablet TAKE 1 AND 1/2 TABLETS AT BEDTIME  45 tablet  2   Assessment: 87 yof on coumadin PTA for atrial fibrillation. INR today is 1.97 which is very slightly subtherapeutic. Pt reports that she has not taken any of her evening tablets today including her coumadin. Will give her normal home dose today since INR is barely subtherapeutic.   Goal of Therapy:  INR 2-3   Plan:  1. Coumadin 1mg  PO x 1 tonight 2. Daily INR  Kimarie Coor, Drake Leach 12/10/2011,5:33 PM

## 2011-12-10 NOTE — H&P (Signed)
PCP:   [name], MD, MD   Chief Complaint:  Shortness of breath  HPI: 78 year old female with a history of COPD, chronic respiratory failure, O2 dependent, atrial fibrillation, history of aortic valve replacement came to the hospital for worsening of shortness of breath for last 3 days. Patient usually follows pulmonary Dr. Maple Hudson as outpatient, she was given antibiotics last week but did not improve so he change antibiotics 3 days ago to Levaquin. Patient says that she has not felt better so she came to the hospital. She admits to be coughing, no fever, positive nausea but no vomiting. Patient also has a poor by mouth intake over the past 2 days. In the ER patient received antibiotics Solu-Medrol and nebulizer treatments, and she's feeling better at this time. She denies any chest pain.  Allergies:   Allergies  Allergen Reactions  . Cephalexin     Unsure as to reaction.  ? Hives with Cephalexin; "I can take Penicillin"  . Atorvastatin     Forgetfulness   . Codeine Nausea And Vomiting  . Crestor (Rosuvastatin Calcium)     Forgetfulness    . Estradiol     Unknown reaction  . Hydrocortisone Hives    Flea bites were mistaken as rash from hydrocortisone  . Mevacor Other (See Comments)    forgetfullness,  . Moxifloxacin     Unknown reaction  . Sulfonamide Derivatives     itching      Past Medical History  Diagnosis Date  . Asthmatic bronchitis   . Allergic rhinitis   . Atrial fibrillation   . History of aortic valve replacement Dr. Tyrone Sage 2006    Has severe residual gradient and will be managed conservatively  . Sleep apnea   . Chronic obstructive asthma   . Osteoporosis   . Pruritic disorder   . Diverticulosis   . Hemorrhoids   . GERD (gastroesophageal reflux disease)   . IBS (irritable bowel syndrome)   . Pacemaker 2010  . Oxygen dependent   . Atrial flutter     on Betapace  . High risk medication use     on Betapace & Coumadin  . Chronic anticoagulation     . COPD (chronic obstructive pulmonary disease)   . Pneumonia   . Anemia   . Arthritis   . Hypertension   . Coronary artery disease   . Pneumonia 09/2011    in NH for Rehab post discharge until 12/18    Past Surgical History  Procedure Date  . Back surgery 1994  . Tonsillectomy   . Appendectomy   . Ganglion cyst excision   . Vocal cord polyps 1975 and 1984  . Aortic valve replacement 2006    Bioprosthetic  . Pacemaker insertion 2010  . Cardiac electrophysiology mapping and ablation   . Coronary artery bypass graft   . Insert / replace / remove pacemaker   . Eye surgery   . Cardiac catheterization   . Coronary angioplasty   . Cardiac valve replacement     Prior to Admission medications   Medication Sig Start Date End Date Taking? Authorizing Provider  Alum & Mag Hydroxide-Simeth (MAGIC MOUTHWASH) SOLN Take 5 mLs by mouth 4 (four) times daily as needed. For mouth irritations    Yes Historical Provider, MD  digoxin (LANOXIN) 0.125 MG tablet Take 125 mcg by mouth daily. 09/06/11  Yes Elyn Aquas., MD  diltiazem (CARDIZEM CD) 240 MG 24 hr capsule Take 240 mg by mouth daily. 09/06/11  Yes  Elyn Aquas., MD  diphenhydrAMINE (CHILDRENS ALLERGY) 12.5 MG/5ML liquid Take 12.5 mg by mouth at bedtime.    Yes Historical Provider, MD  Docusate Calcium (STOOL SOFTENER PO) Take 100 mg by mouth daily.    Yes Historical Provider, MD  esomeprazole (NEXIUM) 40 MG capsule Take 40 mg by mouth daily before breakfast.   Yes Historical Provider, MD  ezetimibe (ZETIA) 10 MG tablet Take 10 mg by mouth daily. 05/25/11  Yes Elyn Aquas., MD  furosemide (LASIX) 40 MG tablet Take 20 mg by mouth 2 (two) times daily.     Yes Historical Provider, MD  Guaifenesin (MUCINEX MAXIMUM STRENGTH) 1200 MG TB12 Take 1,200 mg by mouth 2 (two) times daily at 10 AM and 5 PM.     Yes Historical Provider, MD  IRON PO Take 1 tablet by mouth daily.     Yes Historical Provider, MD  levalbuterol (XOPENEX HFA) 45  MCG/ACT inhaler Inhale 2 puffs into the lungs 2 (two) times daily.    Yes Historical Provider, MD  levalbuterol (XOPENEX) 1.25 MG/3ML nebulizer solution Take 1 ampule by nebulization 3 (three) times daily. DX:  493.20 10/08/11 01/06/12 Yes Waymon Budge, MD  levofloxacin (LEVAQUIN) 500 MG tablet Take 500 mg by mouth daily. 7 day course started on 12-07-11 for  URI. 12/07/11 12/17/11 Yes Clinton D Young, MD  methylPREDNISolone (MEDROL) 4 MG tablet Take 4 mg by mouth daily. Resume after completing the dose pack. 09/21/11  Yes Srikar Cherlynn Kaiser, MD  mometasone-formoterol (DULERA) 100-5 MCG/ACT AERO Inhale 2 puffs into the lungs 2 (two) times daily.   Yes Historical Provider, MD  montelukast (SINGULAIR) 10 MG tablet Take 10 mg by mouth at bedtime.   Yes Historical Provider, MD  potassium chloride SA (K-DUR,KLOR-CON) 20 MEQ tablet Take 20 mEq by mouth 2 (two) times daily.    Yes Historical Provider, MD  sertraline (ZOLOFT) 50 MG tablet Take 50 mg by mouth daily. 10/31/11  Yes Yancey Flemings, MD  sotalol (BETAPACE) 80 MG tablet Take 80 mg by mouth 2 (two) times daily. 11/02/11 11/01/12 Yes Elyn Aquas., MD  tiotropium (SPIRIVA) 18 MCG inhalation capsule Place 18 mcg into inhaler and inhale daily.   Yes Historical Provider, MD  warfarin (COUMADIN) 1 MG tablet Take 0.5-1 mg by mouth daily. Take 0.5 tablet on Tuesday & Thursday Take 1 tablet on all other days   Yes Historical Provider, MD    Social History:  reports that she quit smoking about 18 years ago. Her smoking use included Cigarettes. She has a 50 pack-year smoking history. She has never used smokeless tobacco. She reports that she does not drink alcohol or use illicit drugs.  Family History  Problem Relation Age of Onset  . Kidney failure Father   . Colon cancer Paternal Aunt   . Heart disease Maternal Grandfather   . Heart attack Paternal Grandfather   . Breast cancer Daughter   . Stroke Maternal Grandmother   . Heart disease Mother     Review  of Systems:  HEENT: Denies headache, blurred vision, runny nose, sore throat,  Neck: Denies thyroid problems,lymphadenopathy Chest : See history of present illness Heart : Denies Chest pain GI: Denies vomiting, diarrhea, constipation GU: Denies dysuria, urgency, frequency of urination, hematuria Neuro: Denies stroke, seizures, syncope    Physical Exam: Blood pressure 131/58, pulse 65, temperature 97.1 F (36.2 C), temperature source Oral, resp. rate 22, height 5\' 2"  (1.575 m), weight 59.421 kg (131 lb), SpO2 97.00%.  Constitutional:   Patient is a well-developed and well-nourished female in no acute distress and cooperative with exam. Head: Normocephalic and atraumatic Mouth: Mucus membranes moist Eyes: PERRL, EOMI, conjunctivae normal Neck: Supple, No Thyromegaly Cardiovascular: RRR, S1 normal, S2 normal Pulmonary/Chest: Bilateral wheezing auscultated in all lung fields Abdominal: Soft. Non-tender, non-distended, bowel sounds are normal, no masses, organomegaly, or guarding present.  Neurological: A&O x3, Strenght is normal and symmetric bilaterally, cranial nerve II-XII are grossly intact, no focal motor deficit Extremities : No Cyanosis, Clubbing or Edema   Labs on Admission:  Results for orders placed during the hospital encounter of 12/10/11 (from the past 48 hour(s))  BASIC METABOLIC PANEL     Status: Abnormal   Collection Time   12/10/11  2:36 PM      Component Value Range Comment   Sodium 135  135 - 145 (mEq/L)    Potassium 4.3  3.5 - 5.1 (mEq/L)    Chloride 96  96 - 112 (mEq/L)    CO2 30  19 - 32 (mEq/L)    Glucose, Bld 122 (*) 70 - 99 (mg/dL)    BUN 15  6 - 23 (mg/dL)    Creatinine, Ser 1.61  0.50 - 1.10 (mg/dL)    Calcium 9.7  8.4 - 10.5 (mg/dL)    GFR calc non Af Amer 82 (*) >90 (mL/min)    GFR calc Af Amer >90  >90 (mL/min)   PROTIME-INR     Status: Abnormal   Collection Time   12/10/11  2:36 PM      Component Value Range Comment   Prothrombin Time 22.8 (*) 11.6  - 15.2 (seconds)    INR 1.97 (*) 0.00 - 1.49    CBC     Status: Normal   Collection Time   12/10/11  2:37 PM      Component Value Range Comment   WBC 9.2  4.0 - 10.5 (K/uL)    RBC 4.59  3.87 - 5.11 (MIL/uL)    Hemoglobin 12.5  12.0 - 15.0 (g/dL)    HCT 09.6  04.5 - 40.9 (%)    MCV 83.7  78.0 - 100.0 (fL)    MCH 27.2  26.0 - 34.0 (pg)    MCHC 32.6  30.0 - 36.0 (g/dL)    RDW 81.1  91.4 - 78.2 (%)    Platelets 204  150 - 400 (K/uL)   DIFFERENTIAL     Status: Abnormal   Collection Time   12/10/11  2:37 PM      Component Value Range Comment   Neutrophils Relative 80 (*) 43 - 77 (%)    Neutro Abs 7.4  1.7 - 7.7 (K/uL)    Lymphocytes Relative 11 (*) 12 - 46 (%)    Lymphs Abs 1.0  0.7 - 4.0 (K/uL)    Monocytes Relative 9  3 - 12 (%)    Monocytes Absolute 0.9  0.1 - 1.0 (K/uL)    Eosinophils Relative 0  0 - 5 (%)    Eosinophils Absolute 0.0  0.0 - 0.7 (K/uL)    Basophils Relative 0  0 - 1 (%)    Basophils Absolute 0.0  0.0 - 0.1 (K/uL)   TROPONIN I     Status: Normal   Collection Time   12/10/11  2:37 PM      Component Value Range Comment   Troponin I <0.30  <0.30 (ng/mL)   URINALYSIS, ROUTINE W REFLEX MICROSCOPIC     Status: Abnormal   Collection  Time   12/10/11  4:06 PM      Component Value Range Comment   Color, Urine YELLOW  YELLOW     APPearance CLEAR  CLEAR     Specific Gravity, Urine 1.028  1.005 - 1.030     pH 6.5  5.0 - 8.0     Glucose, UA NEGATIVE  NEGATIVE (mg/dL)    Hgb urine dipstick NEGATIVE  NEGATIVE     Bilirubin Urine NEGATIVE  NEGATIVE     Ketones, ur 15 (*) NEGATIVE (mg/dL)    Protein, ur 30 (*) NEGATIVE (mg/dL)    Urobilinogen, UA 1.0  0.0 - 1.0 (mg/dL)    Nitrite NEGATIVE  NEGATIVE     Leukocytes, UA NEGATIVE  NEGATIVE    URINE MICROSCOPIC-ADD ON     Status: Abnormal   Collection Time   12/10/11  4:06 PM      Component Value Range Comment   Squamous Epithelial / LPF FEW (*) RARE     RBC / HPF 0-2  <3 (RBC/hpf)    Bacteria, UA RARE  RARE     Urine-Other  MUCOUS PRESENT   RARE YEAST  DIGOXIN LEVEL     Status: Abnormal   Collection Time   12/10/11  4:30 PM      Component Value Range Comment   Digoxin Level 0.6 (*) 0.8 - 2.0 (ng/mL)     Radiological Exams on Admission: Dg Chest 2 View  12/10/2011  *RADIOLOGY REPORT*  Clinical Data: Weakness and shortness of breath.  CHEST - 2 VIEW  Comparison: Chest x-ray 12/06/2011.  Findings: The cardiac silhouette, mediastinal and hilar contours are within normal limits and stable.  Stable surgical changes from bypass surgery.  The pacer wires are stable. Stable right upper lobe opacity, likely scar.  There are persistent but slight improving infiltrates.  No definite pleural effusions or pneumothorax. Underlying emphysematous changes are again demonstrated.  IMPRESSION:  1.  Slight improved lung aeration with some persistent patchy infiltrates. 2.  Stable right upper lobe lung opacity. 3.  Stable emphysematous changes.  Original Report Authenticated By: P. Loralie Champagne, M.D.    Assessment/Plan  Presumed hospital acquired pneumonia The chest x-ray shows some persistent patchy infiltrates,  Will obtain 2 sets of blood cultures, and start the patient on vancomycin and Zosyn. If patient shows improvement consider changing the antibiotics to by mouth, as patient's WBC is normal and she is afebrile. Patient was discharged from the hospital on 09/15/2011.  COPD exacerbation We'll start Solu-Medrol 60 mg IV every 6 hours, has been started on vancomycin and Zosyn per pharmacy, nebulizer treatments with Xopenex and ipratropium every 4 hours.  Atrial fibrillation Will continue digoxin, Cardizem. Also continue anticoagulation with Coumadin and patient will be admitted to floor with telemetry.  Constipation We'll start the Dulcolax suppository when necessary  Questionable acute diastolic congestive heart failure Patient's last echo showed EF of 60% with mild LVH and she is currently on Lasix 20 mg by mouth twice a  day. Will also get a BNP and continue the patient on Lasix  DVT prophylaxis A shunt is currently on Coumadin  CODE STATUS Patient is full code  Time Spent on Admission: 60 min  Wilian Kwong S [facility] Pager: [phone] 12/10/2011, 5:15 PM

## 2011-12-10 NOTE — ED Provider Notes (Signed)
Medical screening examination/treatment/procedure(s) were conducted as a shared visit with non-physician practitioner(s) and myself.  I personally evaluated the patient during the encounter  This is now the patient's third visit to medical provider.  Her shortness of breath is worsening.  She's been given IV Solu-Medrol.  She'll be given a breathing treatment here in all cover her for possible healthcare associated pneumonia Vanc and Zosyn.  Lyanne Co, MD 12/10/11 [phone]

## 2011-12-10 NOTE — ED Notes (Signed)
Patient states she was having sob on tues and called Dr. Maple Hudson was called in Amoxcillin took for 2 days, Went for a rountine appointment on Thurs and states her antibiotics were changed. States she hasn't  Felt any better until this am states she did feel slightly better and was able to stand. C/o productive cough with green colored sputum. Patient states Dr. Maple Hudson has been watching her right lung for some time. Patient c/o pain in right lower rib area comes and goes, rates 5-6/10 when she has pain. Alert oriented.

## 2011-12-10 NOTE — Progress Notes (Signed)
ANTIBIOTIC CONSULT NOTE - INITIAL  Pharmacy Consult for vancomycin + zosyn Indication: pneumonia  Allergies  Allergen Reactions  . Cephalexin     Unsure as to reaction.  ? Hives with Cephalexin; "I can take Penicillin"  . Atorvastatin     Forgetfulness   . Codeine Nausea And Vomiting  . Crestor (Rosuvastatin Calcium)     Forgetfulness    . Estradiol     Unknown reaction  . Hydrocortisone Hives    Flea bites were mistaken as rash from hydrocortisone  . Mevacor Other (See Comments)    forgetfullness,  . Moxifloxacin     Unknown reaction  . Sulfonamide Derivatives     itching    Patient Measurements: Height: 5\' 2"  (157.5 cm) Weight: 131 lb (59.421 kg) IBW/kg (Calculated) : 50.1   Vital Signs: Temp: 97.1 F (36.2 C) (02/04 1242) Temp src: Oral (02/04 1242) BP: 131/58 mmHg (02/04 1644) Pulse Rate: 65  (02/04 1644) Intake/Output from previous day:   Intake/Output from this shift:    Labs:  Basename 12/10/11 1437 12/10/11 1436  WBC 9.2 --  HGB 12.5 --  PLT 204 --  LABCREA -- --  CREATININE -- 0.57   Estimated Creatinine Clearance: 40.7 ml/min (by C-G formula based on Cr of 0.57). No results found for this basename: VANCOTROUGH:2,VANCOPEAK:2,VANCORANDOM:2,GENTTROUGH:2,GENTPEAK:2,GENTRANDOM:2,TOBRATROUGH:2,TOBRAPEAK:2,TOBRARND:2,AMIKACINPEAK:2,AMIKACINTROU:2,AMIKACIN:2, in the last 72 hours   Microbiology: No results found for this or any previous visit (from the past 720 hour(s)).  Medical History: Past Medical History  Diagnosis Date  . Asthmatic bronchitis   . Allergic rhinitis   . Atrial fibrillation   . History of aortic valve replacement Dr. Tyrone Sage 2006    Has severe residual gradient and will be managed conservatively  . Sleep apnea   . Chronic obstructive asthma   . Osteoporosis   . Pruritic disorder   . Diverticulosis   . Hemorrhoids   . GERD (gastroesophageal reflux disease)   . IBS (irritable bowel syndrome)   . Pacemaker 2010  . Oxygen  dependent   . Atrial flutter     on Betapace  . High risk medication use     on Betapace & Coumadin  . Chronic anticoagulation   . COPD (chronic obstructive pulmonary disease)   . Pneumonia   . Anemia   . Arthritis   . Hypertension   . Coronary artery disease   . Pneumonia 09/2011    in NH for Rehab post discharge until 12/18   Assessment: 87 yof to be admitted for treatment of pneumonia. Admitted with shortness of breath and seemed to have failed outpatient levaquin treatment. Starting empiric vancomycin/zosyn. She received 1gm vanc and 3.375gm zosyn in the ED so far.   Goal of Therapy:  Vancomycin trough level 15-20 mcg/ml  Plan:  Measure antibiotic drug levels at steady state Follow up culture results Vancomycin 500mg  IV Q12H Zosyn 3.375gm IV Q8H (4 hour infusion)  Patric Vanpelt, Drake Leach 12/10/2011,5:41 PM

## 2011-12-11 LAB — COMPREHENSIVE METABOLIC PANEL
AST: 22 U/L (ref 0–37)
Albumin: 3 g/dL — ABNORMAL LOW (ref 3.5–5.2)
Alkaline Phosphatase: 50 U/L (ref 39–117)
Chloride: 94 mEq/L — ABNORMAL LOW (ref 96–112)
Potassium: 3.9 mEq/L (ref 3.5–5.1)
Sodium: 132 mEq/L — ABNORMAL LOW (ref 135–145)
Total Bilirubin: 0.4 mg/dL (ref 0.3–1.2)

## 2011-12-11 LAB — CBC
HCT: 38.2 % (ref 36.0–46.0)
MCH: 27.1 pg (ref 26.0–34.0)
MCV: 84.1 fL (ref 78.0–100.0)
Platelets: 212 10*3/uL (ref 150–400)
RBC: 4.54 MIL/uL (ref 3.87–5.11)
RDW: 14.4 % (ref 11.5–15.5)

## 2011-12-11 MED ORDER — SENNOSIDES-DOCUSATE SODIUM 8.6-50 MG PO TABS
1.0000 | ORAL_TABLET | Freq: Two times a day (BID) | ORAL | Status: DC
  Administered 2011-12-11 – 2011-12-16 (×11): 1 via ORAL
  Filled 2011-12-11 (×11): qty 1

## 2011-12-11 MED ORDER — NON FORMULARY: 40.0000 mg | Freq: Every day | Status: DC

## 2011-12-11 MED ORDER — ESOMEPRAZOLE MAGNESIUM 40 MG PO CPDR
40.0000 mg | DELAYED_RELEASE_CAPSULE | Freq: Every day | ORAL | Status: DC
  Administered 2011-12-11 – 2011-12-17 (×7): 40 mg via ORAL
  Filled 2011-12-11 (×7): qty 1

## 2011-12-11 MED ORDER — WARFARIN SODIUM 1 MG PO TABS
1.5000 mg | ORAL_TABLET | Freq: Once | ORAL | Status: AC
  Administered 2011-12-11: 1.5 mg via ORAL
  Filled 2011-12-11: qty 1

## 2011-12-11 NOTE — Progress Notes (Signed)
ANTICOAGULATION CONSULT NOTE - Follow Up Consult  Pharmacy Consult for Coumadin Indication: atrial fibrillation and porcine aortic valve replacement  Allergies  Allergen Reactions  . Cephalexin     Unsure as to reaction.  ? Hives with Cephalexin; "I can take Penicillin"  . Atorvastatin     Forgetfulness   . Codeine Nausea And Vomiting  . Crestor (Rosuvastatin Calcium)     Forgetfulness    . Estradiol     Unknown reaction  . Hydrocortisone Hives    Flea bites were mistaken as rash from hydrocortisone  . Mevacor Other (See Comments)    forgetfullness,  . Moxifloxacin     Unknown reaction  . Sulfonamide Derivatives     itching    Patient Measurements: Height: 5' 2.5" (158.8 cm) Weight: 131 lb 2.8 oz (59.5 kg) IBW/kg (Calculated) : 51.25    Vital Signs: Temp: 98.1 F (36.7 C) (02/05 0542) Temp src: Oral (02/05 0542) BP: 129/64 mmHg (02/05 0542) Pulse Rate: 61  (02/05 0542)  Labs:  Basename 12/11/11 8119 12/10/11 1833 12/10/11 1437 12/10/11 1436  HGB 12.3 -- 12.5 --  HCT 38.2 -- 38.4 --  PLT 212 -- 204 --  APTT -- -- -- --  LABPROT 22.3* 22.2* -- 22.8*  INR 1.92* 1.91* -- 1.97*  HEPARINUNFRC -- -- -- --  CREATININE 0.52 -- -- 0.57  CKTOTAL -- -- -- --  CKMB -- -- -- --  TROPONINI -- -- <0.30 --   Estimated Creatinine Clearance: 41.6 ml/min (by C-G formula based on Cr of 0.52).  Assessment: Admit Complaint: Shortness of breath Pharmacist System-Based Medication Review: Anticoagulation Hx porcine aortic valve replacement, Afib. Home Coumadin regimen was 1mg  daily except 0.5mg  on Thursdays per [facility] notes, and reported by patient on admit as 1 mg daily except 0.5mg  Tue/Thu. INR today still slightly below goal. CBC stable. Will given Coumadin 1.5mg  today.  Infectious Disease AECOPD, outpatient Levaquin x 3 days PTA. Now on empiric Vanc/Zosyn. Blood cx pending. Afebrile, WBC WNL. Scr stable. 2/4 Vanc >> 2/4 Zosyn >> No change to abx doses.    Cardiovascular VSS, home meds resumed.  Endocrinology Solumedrol taper on board. No hx DM- whole blood glucose elevated  Consider SSI?  Gastrointestinal / Nutrition Low Na diet  Neurology Zoloft PTA, continued  Nephrology Na low, other lytes ok. Scr stable  Pulmonary COPD, O2 dependant  Hematology / Oncology CBC stable  PTA Medication Issues Nexium PTA, not resumed  Best Practices Coumadin continues Consider starting Protonix 80mg  daily- patient was on Nexium PTA   Goal of Therapy:  INR 2-3   Plan:  1. Coumadin 1.5mg  PO x 1 today 2. F/up daily INR  Bradely Rudin K. Allena Katz, PharmD, BCPS.  Clinical Pharmacist Pager [phone]. 12/11/2011 10:43 AM

## 2011-12-11 NOTE — Progress Notes (Signed)
Patient ID: [name], female   DOB: Feb 19, 1924, 78 y.o.   MRN: [id number] Subjective:  My breathing is shallow.  My abdomen is distended.  I have had abnormal LFTs in the past, the medicine I'm on effects them.   Objective: Weight change:   Intake/Output Summary (Last 24 hours) at 12/11/11 1444 Last data filed at 12/11/11 1400  Gross per 24 hour  Intake    360 ml  Output    450 ml  Net    -90 ml   Blood pressure 128/74, pulse 61, temperature 98.5 F (36.9 C), temperature source Oral, resp. rate 20, height 5' 2.5" (1.588 m), weight 59.5 kg (131 lb 2.8 oz), SpO2 97.00%. Filed Vitals:   12/10/11 2350 12/11/11 0542 12/11/11 0816 12/11/11 1400  BP:  129/64  128/74  Pulse:  61  61  Temp:  98.1 F (36.7 C)  98.5 F (36.9 C)  TempSrc:  Oral  Oral  Resp:  20  20  Height:      Weight:      SpO2: 96% 96% 95% 97%    Physical Exam: General: No acute distress Lungs: Minimal wheeze posteriorly. Cardiovascular: Regular rate and rhythm with systolic murmur, no gallop or rub normal S1 and S2 Abdomen: distended, slightly tight,Nontender, soft, bowel sounds positive Extremities: No significant cyanosis, clubbing, or edema bilateral lower extremities  Basic Metabolic Panel:  Lab 12/11/11 4098 12/10/11 1436  NA 132* 135  K 3.9 4.3  CL 94* 96  CO2 28 30  GLUCOSE 173* 122*  BUN 14 15  CREATININE 0.52 0.57  CALCIUM 9.5 9.7  MG -- --  PHOS -- --   Liver Function Tests:  Lab 12/11/11 0633  AST 22  ALT 25  ALKPHOS 50  BILITOT 0.4  PROT 6.8  ALBUMIN 3.0*   CBC:  Lab 12/11/11 0633 12/10/11 1437 12/06/11 1505  WBC 8.1 9.2 --  NEUTROABS -- 7.4 9.6*  HGB 12.3 12.5 --  HCT 38.2 38.4 --  MCV 84.1 83.7 --  PLT 212 204 --   Cardiac Enzymes:  Lab 12/10/11 1437  CKTOTAL --  CKMB --  CKMBINDEX --  TROPONINI <0.30   BNP: Coagulation:  Lab 12/11/11 0633 12/10/11 1833 12/10/11 1436  LABPROT 22.3* 22.2* 22.8*  INR 1.92* 1.91* 1.97*    Studies/Results: Scheduled  Meds:   . albuterol  5 mg Nebulization Once  . digoxin  125 mcg Oral Daily  . diltiazem  240 mg Oral Daily  . diphenhydrAMINE  12.5 mg Oral QHS  . ezetimibe  10 mg Oral Daily  . ezetimibe  10 mg Oral Once  . furosemide  20 mg Oral BID  . ipratropium  0.5 mg Nebulization Once  . ipratropium  0.5 mg Nebulization Q4H  . levalbuterol  0.63 mg Nebulization Q4H  . methylPREDNISolone (SOLU-MEDROL) injection  125 mg Intravenous Once  . methylPREDNISolone (SOLU-MEDROL) injection  60 mg Intravenous Q6H  . mometasone-formoterol  2 puff Inhalation BID  . montelukast  10 mg Oral QHS  . piperacillin-tazobactam (ZOSYN)  IV  3.375 g Intravenous Once  . piperacillin-tazobactam (ZOSYN)  IV  3.375 g Intravenous Q8H  . potassium chloride SA  20 mEq Oral BID  . senna-docusate  1 tablet Oral BID  . sertraline  50 mg Oral Daily  . sodium chloride  3 mL Intravenous Q12H  . sotalol  80 mg Oral BID  . vancomycin  500 mg Intravenous Q12H  . vancomycin  1,000 mg Intravenous Once  .  warfarin  1 mg Oral To Major  . warfarin  1.5 mg Oral ONCE-1800  . DISCONTD: warfarin  0.5-1 mg Oral Daily   Continuous Infusions:  PRN Meds:.magic mouthwash  Anti-infectives:  Anti-infectives     Start     Dose/Rate Route Frequency Ordered Stop   12/11/11 0500   vancomycin (VANCOCIN) 500 mg in sodium chloride 0.9 % 100 mL IVPB        500 mg 100 mL/hr over 60 Minutes Intravenous Every 12 hours 12/10/11 1822     12/11/11 0100  piperacillin-tazobactam (ZOSYN) IVPB 3.375 g       3.375 g 12.5 mL/hr over 240 Minutes Intravenous Every 8 hours 12/10/11 1822     12/10/11 1630   vancomycin (VANCOCIN) IVPB 1000 mg/200 mL premix        1,000 mg 200 mL/hr over 60 Minutes Intravenous  Once 12/10/11 1629 12/10/11 1814   12/10/11 1630  piperacillin-tazobactam (ZOSYN) IVPB 3.375 g       3.375 g 12.5 mL/hr over 240 Minutes Intravenous  Once 12/10/11 1629 12/10/11 2046          Assessment/Plan: Active Problems:  COPD with  exacerbation  Pneumonia  CONSTIPATION  Aortic stenosis  IBS  Pacemaker  Chronic anticoagulation  1.  Pneumonia - HCAP on broad spectrum antibiotics.  Depending on blood culture results will de-escalate antibiotics tomorrow. 2.  COPD exacerbation- Nebulizers, Iv solumedrol, Flutter Valve, Antibiotics.  Patient appears improved from admission. 3.  Slightly elevated BNP? History of diastolic CHF continue PO lasix 4.  Has history of Valve replacement and Afib on coumadin. Last Echo 09/2011 4.  Constipation - will start Senna/S 5.  Abdominal distension - will add Senna/S and check abdominal U/S ? Ascites? 6.  Dispo:  Patient is from home, lives alone, but was in [place] for Rehab in November - December 2012.  PT/OT eval ordered.     LOS: 1 day   Stephani Police 12/11/2011, 2:44 PM [phone] Pt seen and examined agree with above For abd distension agree with senokot, check KuB

## 2011-12-12 ENCOUNTER — Inpatient Hospital Stay (HOSPITAL_COMMUNITY): Payer: Medicare Other

## 2011-12-12 LAB — PROTIME-INR: Prothrombin Time: 30.1 seconds — ABNORMAL HIGH (ref 11.6–15.2)

## 2011-12-12 MED ORDER — LEVALBUTEROL HCL 0.63 MG/3ML IN NEBU
0.6300 mg | INHALATION_SOLUTION | Freq: Three times a day (TID) | RESPIRATORY_TRACT | Status: DC
  Administered 2011-12-12: 0.63 mg via RESPIRATORY_TRACT
  Filled 2011-12-12 (×2): qty 3

## 2011-12-12 MED ORDER — GUAIFENESIN ER 600 MG PO TB12
600.0000 mg | ORAL_TABLET | Freq: Two times a day (BID) | ORAL | Status: DC
  Administered 2011-12-13: 600 mg via ORAL
  Filled 2011-12-12 (×3): qty 1

## 2011-12-12 MED ORDER — METHYLPREDNISOLONE SODIUM SUCC 125 MG IJ SOLR
60.0000 mg | Freq: Two times a day (BID) | INTRAMUSCULAR | Status: DC
  Administered 2011-12-13 (×2): 60 mg via INTRAVENOUS
  Filled 2011-12-12 (×2): qty 0.96

## 2011-12-12 MED ORDER — IPRATROPIUM BROMIDE 0.02 % IN SOLN
0.5000 mg | Freq: Three times a day (TID) | RESPIRATORY_TRACT | Status: DC
  Administered 2011-12-12: 0.5 mg via RESPIRATORY_TRACT
  Filled 2011-12-12: qty 2.5

## 2011-12-12 NOTE — Progress Notes (Signed)
Tech obtained ambulating O2 saturations with 3.5L O2 on. Lowest sat was 87% and highest was 98%. Will continue to monitor.

## 2011-12-12 NOTE — Progress Notes (Signed)
Pt was seen and examined at bedside. I have reviewed labs and vitals which are stable and pt is hemodynamically stable. I agree with the above's assessment and plan.   MAGICK-MYERS, ISKRA  [phone]  

## 2011-12-12 NOTE — Evaluation (Signed)
Occupational Therapy Evaluation Patient Details Name: RUDINE RIEGER MRN: [id number] DOB: 01/04/1924 Today's Date: 12/12/2011 10:56-11:30 Co-session with PT Alferd Apa)  Problem List:  Patient Active Problem List  Diagnoses  . UNSPECIFIED ANEMIA  . Atrial fibrillation  . ASTHMATIC BRONCHITIS, ACUTE  . CHRONIC OBSTRUCTIVE ASTHMA UNSPECIFIED  . GASTROENTERITIS  . CONSTIPATION  . IBS  . PRURITIC DISORDER NOS  . OCCIPITAL NEURALGIA  . OSTEOPOROSIS  . ORTHOSTATIC DIZZINESS  . SLEEP APNEA, MILD  . FATIGUE, CHRONIC  . DYSPNEA  . HEMOPTYSIS UNSPECIFIED  . FLATULENCE-GAS-BLOATING  . POSTURAL HYPOTENSION  . OTHER SPECIFIED DISEASE OF NAIL  . PARESTHESIA, HANDS  . Allergic rhinitis due to pollen  . Aortic stenosis  . Pacemaker  . Chronic anticoagulation  . COPD with exacerbation  . Pneumonia    Past Medical History:  Past Medical History  Diagnosis Date  . Asthmatic bronchitis   . Allergic rhinitis   . Atrial fibrillation   . History of aortic valve replacement Dr. Tyrone Sage 2006    Has severe residual gradient and will be managed conservatively  . Sleep apnea   . Chronic obstructive asthma   . Osteoporosis   . Pruritic disorder   . Diverticulosis   . Hemorrhoids   . GERD (gastroesophageal reflux disease)   . IBS (irritable bowel syndrome)   . Pacemaker 2010  . Oxygen dependent   . Atrial flutter     on Betapace  . High risk medication use     on Betapace & Coumadin  . Chronic anticoagulation   . COPD (chronic obstructive pulmonary disease)   . Pneumonia   . Anemia   . Arthritis   . Hypertension   . Coronary artery disease   . Pneumonia 09/2011    in [facility] for [facility] post discharge until 12/18   Past Surgical History:  Past Surgical History  Procedure Date  . Back surgery 1994  . Tonsillectomy   . Appendectomy   . Ganglion cyst excision   . Vocal cord polyps 1975 and 1984  . Aortic valve replacement 2006    Bioprosthetic  . Pacemaker insertion 2010  .  Cardiac electrophysiology mapping and ablation   . Coronary artery bypass graft   . Insert / replace / remove pacemaker   . Eye surgery   . Cardiac catheterization   . Coronary angioplasty   . Cardiac valve replacement     OT Assessment/Plan/Recommendation OT Assessment Clinical Impression Statement: This 78 yo female presents with SOB and found to have CAP. Presents with problems below thus affecting pt's PLOF at min A to I. Will benefit from acute OT with followup at [facility] or SNF(depending on progress)  to get to an I/Mod I level. OT Recommendation/Assessment: Patient will need skilled OT in the acute care venue OT Problem List: Decreased strength;Decreased activity tolerance;Impaired balance (sitting and/or standing);Decreased knowledge of use of DME or AE Barriers to Discharge: Decreased caregiver support OT Therapy Diagnosis : Generalized weakness OT Plan OT Frequency: Min 2X/week OT Treatment/Interventions: Self-care/ADL training;Energy conservation;DME and/or AE instruction;Patient/family education;Balance training OT Recommendation Follow Up Recommendations: Home health OT;Skilled nursing facility;Other (comment) (depending on progress) Equipment Recommended: None recommended by OT Individuals Consulted Consulted and Agree with Results and Recommendations: Patient OT Goals Acute Rehab OT Goals OT Goal Formulation: With patient  ADL Goals  Pt Will Perform Grooming: with set-up;with supervision;Unsupported;Other (comment);Standing at sink (at least 2 tasks) ADL Goal: Grooming - Progress: Goal set today  Pt Will Perform Lower Body Dressing: with  set-up;with adaptive equipment;Unsupported;Sit to stand from bed;Sit to stand from chair ADL Goal: Lower Body Dressing - Progress: Goal set today  Pt Will Transfer to Toilet: with supervision;Ambulation;3-in-1 ADL Goal: Toilet Transfer - Progress: Goal set today  Miscellaneous OT Goals  Miscellaneous OT Goal #1: Pt will use pursed  lipped breathing prn without cues OT Goal: Miscellaneous Goal #1 - Progress: Goal set today  OT Evaluation Precautions/Restrictions  Precautions Precautions: Fall Precaution Comments: Sats with activity Required Braces or Orthoses: No Restrictions Weight Bearing Restrictions: No Prior Functioning Home Living Lives With: Alone Receives Help From: Personal care attendant;Other (Comment) (5 hours a day ) Type of Home: House Home Layout: One level Home Access: Stairs to enter Entrance Stairs-Rails: Doctor, general practice of Steps: 6 Bathroom Shower/Tub: Forensic scientist: Standard Bathroom Accessibility: No Home Adaptive Equipment: Walker - rolling;Walker - four wheeled;Grab bars around toilet;Grab bars in shower Prior Function Level of Independence: Needs assistance with ADLs;Needs assistance with homemaking Bath: Minimal Dressing: Minimal Meal Prep: Moderate Light Housekeeping: Moderate Able to Take Stairs?: Yes Driving: Yes Vocation: Retired Leisure: Hobbies-no ADL ADL Eating/Feeding: Simulated;Independent Where Assessed - Eating/Feeding: Edge of bed Grooming: Performed;Wash/dry hands;Set up;Supervision/safety Where Assessed - Grooming: Unsupported;Standing at sink Upper Body Bathing: Simulated;Supervision/safety;Set up Where Assessed - Upper Body Bathing: Unsupported;Sit to stand from chair;Sit to stand from bed Lower Body Bathing: Simulated;Moderate assistance Where Assessed - Lower Body Bathing: Unsupported;Sit to stand from chair;Sit to stand from bed Upper Body Dressing: Simulated;Set up;Supervision/safety Where Assessed - Upper Body Dressing: Sitting, bed;Unsupported;Sitting, chair Lower Body Dressing: Simulated;Moderate assistance Where Assessed - Lower Body Dressing: Unsupported;Sitting, bed;Sitting, chair Toilet Transfer: Performed;Other (comment) (min guard A) Toilet Transfer Method: Proofreader:  Raised toilet seat with arms (or 3-in-1 over toilet) Toileting - Clothing Manipulation: Simulated;Independent Where Assessed - Toileting Clothing Manipulation: Standing Toileting - Hygiene: Simulated;Independent Where Assessed - Toileting Hygiene: Sit on 3-in-1 or toilet Tub/Shower Transfer: Not assessed Tub/Shower Transfer Method: Not assessed Equipment Used: Rolling walker;Other (comment) (O2 at 3 liters) Ambulation Related to ADLs: min A Vision/Perception    Cognition Cognition Arousal/Alertness: Awake/alert Overall Cognitive Status: Appears within functional limits for tasks assessed Sensation/Coordination   Extremity Assessment RUE Assessment RUE Assessment: Within Functional Limits LUE Assessment LUE Assessment: Within Functional Limits Mobility  Bed Mobility Bed Mobility: Yes Rolling Right: 5: Supervision Right Sidelying to Sit: 5: Supervision Right Sidelying to Sit Details (indicate cue type and reason): with min VCs Sitting - Scoot to Edge of Bed: 7: Independent Transfers Transfers: Yes Sit to Stand: With upper extremity assist;From bed;Other (comment) (min guard A) Stand to Sit: With armrests;With upper extremity assist;To chair/3-in-1;Other (comment) (min guard A) Exercises   End of Session OT - End of Session Equipment Utilized During Treatment: Gait belt Activity Tolerance: Other (comment) (mild limitation due to DOE) Patient left: in chair;with call bell in reach;Other (comment) (phone within reach) Nurse Communication: Mobility status for transfers;Mobility status for ambulation General Behavior During Session: [facility] for tasks performed Cognition: [facility] for tasks performed   Evette Georges [phone] 12/12/2011, 2:21 PM

## 2011-12-12 NOTE — Progress Notes (Signed)
Patient ID: [name], female   DOB: 1924/05/08, 78 y.o.   MRN: [id number]  Subjective:  My breathing is still shallow.  My chest usually opens up on treatment but I don't feel like its opening up.  Objective: Weight change:   Intake/Output Summary (Last 24 hours) at 12/12/11 1525 Last data filed at 12/12/11 0933  Gross per 24 hour  Intake    246 ml  Output    300 ml  Net    -54 ml   Blood pressure 125/65, pulse 66, temperature 97.9 F (36.6 C), temperature source Oral, resp. rate 18, height 5' 2.5" (1.588 m), weight 59.5 kg (131 lb 2.8 oz), SpO2 95.00%. Filed Vitals:   12/12/11 0933 12/12/11 1059 12/12/11 1221 12/12/11 1335  BP: 110/64   125/65  Pulse: 76   66  Temp:    97.9 F (36.6 C)  TempSrc:    Oral  Resp:    18  Height:      Weight:      SpO2:  93% 96% 95%    Physical Exam: General: No acute distress, becomes sob when speaking Lungs: Minimal wheeze anteriorly and posteriorly. Cardiovascular: Regular rate and rhythm with systolic murmur, no gallop or rub normal S1 and S2 Abdomen: distended, slightly tight,Nontender, soft, bowel sounds positive Extremities: No significant cyanosis, clubbing, or edema bilateral lower extremities.  Multiple small bruises on LEs bilaterally.  Basic Metabolic Panel:  Lab 12/11/11 1324 12/10/11 1436  NA 132* 135  K 3.9 4.3  CL 94* 96  CO2 28 30  GLUCOSE 173* 122*  BUN 14 15  CREATININE 0.52 0.57  CALCIUM 9.5 9.7  MG -- --  PHOS -- --   Liver Function Tests:  Lab 12/11/11 0633  AST 22  ALT 25  ALKPHOS 50  BILITOT 0.4  PROT 6.8  ALBUMIN 3.0*   CBC:  Lab 12/11/11 0633 12/10/11 1437 12/06/11 1505  WBC 8.1 9.2 --  NEUTROABS -- 7.4 9.6*  HGB 12.3 12.5 --  HCT 38.2 38.4 --  MCV 84.1 83.7 --  PLT 212 204 --   Cardiac Enzymes:  Lab 12/10/11 1437  CKTOTAL --  CKMB --  CKMBINDEX --  TROPONINI <0.30   BNP: Coagulation:  Lab 12/12/11 0545 12/11/11 0633 12/10/11 1833 12/10/11 1436  LABPROT 30.1* 22.3* 22.2*  22.8*  INR 2.82* 1.92* 1.91* 1.97*    Studies/Results: Scheduled Meds:    . digoxin  125 mcg Oral Daily  . diltiazem  240 mg Oral Daily  . diphenhydrAMINE  12.5 mg Oral QHS  . esomeprazole  40 mg Oral Q1200  . ezetimibe  10 mg Oral Daily  . furosemide  20 mg Oral BID  . ipratropium  0.5 mg Nebulization Q4H  . levalbuterol  0.63 mg Nebulization Q4H  . methylPREDNISolone (SOLU-MEDROL) injection  60 mg Intravenous Q6H  . mometasone-formoterol  2 puff Inhalation BID  . montelukast  10 mg Oral QHS  . piperacillin-tazobactam (ZOSYN)  IV  3.375 g Intravenous Q8H  . potassium chloride SA  20 mEq Oral BID  . senna-docusate  1 tablet Oral BID  . sertraline  50 mg Oral Daily  . sodium chloride  3 mL Intravenous Q12H  . sotalol  80 mg Oral BID  . vancomycin  500 mg Intravenous Q12H  . warfarin  1.5 mg Oral ONCE-1800  . DISCONTD: NON FORMULARY 40 mg  40 mg Oral Daily   Continuous Infusions:  PRN Meds:.magic mouthwash  Anti-infectives:  Anti-infectives  Start     Dose/Rate Route Frequency Ordered Stop   12/11/11 0500   vancomycin (VANCOCIN) 500 mg in sodium chloride 0.9 % 100 mL IVPB        500 mg 100 mL/hr over 60 Minutes Intravenous Every 12 hours 12/10/11 1822     12/11/11 0100   piperacillin-tazobactam (ZOSYN) IVPB 3.375 g        3.375 g 12.5 mL/hr over 240 Minutes Intravenous Every 8 hours 12/10/11 1822     12/10/11 1630   vancomycin (VANCOCIN) IVPB 1000 mg/200 mL premix        1,000 mg 200 mL/hr over 60 Minutes Intravenous  Once 12/10/11 1629 12/10/11 1814   12/10/11 1630   piperacillin-tazobactam (ZOSYN) IVPB 3.375 g        3.375 g 12.5 mL/hr over 240 Minutes Intravenous  Once 12/10/11 1629 12/10/11 2046          Assessment/Plan: Active Problems:  COPD with exacerbation  Pneumonia  CONSTIPATION  Aortic stenosis  IBS  Pacemaker  Chronic anticoagulation  1.  Pneumonia -  HCA PNA on broad spectrum antibiotics Vanc/Zosyn D3.  Bld cultures are pending but show  no growth preliminarily.  Need to discuss antibiotic therapy with attending.   2.  COPD - Patient lives on 3.5 Liters of Oxygen at home.  Will check ambulating Oxygen sats on 3.5 liters.  Continue Nebulizers, Flutter Valve, Antibiotics, 1/2 solumedrol, and change to prednisone tomorrow.  Patient appears improved from admission.  3.  Slightly elevated BNP - on lasix.Marland Kitchen  Has history of Valve replacement and Afib on coumadin. Last Echo 09/2011.  Does not appear to have any fluid overload.  4.  Constipation -  start Senna/S bid on 2/5.  5.  Abdominal distension - will add Senna/S.  Abdominal U/S WNL.  6.  Dispo:  Patient is from home, lives alone, but was in [place]  for Rehab in November - December 2012.  PT/OT recommend SNF.     LOS: 2 days   Stephani Police 12/12/2011, 3:25 PM [phone]

## 2011-12-12 NOTE — Evaluation (Signed)
Physical Therapy Evaluation Patient Details Name: [name] MRN: [id number] DOB: [birth year] Today's Date: 12/12/2011  Problem List:  Patient Active Problem List  Diagnoses  . UNSPECIFIED ANEMIA  . Atrial fibrillation  . ASTHMATIC BRONCHITIS, ACUTE  . CHRONIC OBSTRUCTIVE ASTHMA UNSPECIFIED  . GASTROENTERITIS  . CONSTIPATION  . IBS  . PRURITIC DISORDER NOS  . OCCIPITAL NEURALGIA  . OSTEOPOROSIS  . ORTHOSTATIC DIZZINESS  . SLEEP APNEA, MILD  . FATIGUE, CHRONIC  . DYSPNEA  . HEMOPTYSIS UNSPECIFIED  . FLATULENCE-GAS-BLOATING  . POSTURAL HYPOTENSION  . OTHER SPECIFIED DISEASE OF NAIL  . PARESTHESIA, HANDS  . Allergic rhinitis due to pollen  . Aortic stenosis  . Pacemaker  . Chronic anticoagulation  . COPD with exacerbation  . Pneumonia    Past Medical History:  Past Medical History  Diagnosis Date  . Asthmatic bronchitis   . Allergic rhinitis   . Atrial fibrillation   . History of aortic valve replacement Dr. Tyrone Sage 2006    Has severe residual gradient and will be managed conservatively  . Sleep apnea   . Chronic obstructive asthma   . Osteoporosis   . Pruritic disorder   . Diverticulosis   . Hemorrhoids   . GERD (gastroesophageal reflux disease)   . IBS (irritable bowel syndrome)   . Pacemaker 2010  . Oxygen dependent   . Atrial flutter     on Betapace  . High risk medication use     on Betapace & Coumadin  . Chronic anticoagulation   . COPD (chronic obstructive pulmonary disease)   . Pneumonia   . Anemia   . Arthritis   . Hypertension   . Coronary artery disease   . Pneumonia 09/2011    in NH for Rehab post discharge until 12/18   Past Surgical History:  Past Surgical History  Procedure Date  . Back surgery 1994  . Tonsillectomy   . Appendectomy   . Ganglion cyst excision   . Vocal cord polyps 1975 and 1984  . Aortic valve replacement 2006    Bioprosthetic  . Pacemaker insertion 2010  . Cardiac electrophysiology mapping and ablation     . Coronary artery bypass graft   . Insert / replace / remove pacemaker   . Eye surgery   . Cardiac catheterization   . Coronary angioplasty   . Cardiac valve replacement     PT Assessment/Plan/Recommendation PT Assessment Clinical Impression Statement: Pt is an 78 y/o female who presents with decline in functional mobility likely due to weakness from current illness (pneumonia).  At this time pt would require 24 hour supervision at home for safety.  If 24 hour supervison is not available pt may benefit from short term SNF placement.  Acute PT will continue to follow pt and assess for most appropriate discharge setting.  Suggest Case Manager consult to explain pt's options.  PT Recommendation/Assessment: Patient will need skilled PT in the acute care venue PT Problem List: Decreased strength;Decreased activity tolerance;Decreased mobility;Cardiopulmonary status limiting activity Problem List Comments: O2 sat decrease with activity.   Barriers to Discharge: Decreased caregiver support PT Therapy Diagnosis : Abnormality of gait;Generalized weakness PT Plan PT Frequency: Min 2X/week PT Treatment/Interventions: Gait training;Functional mobility training;Therapeutic activities;Stair training;Therapeutic exercise;Balance training;Patient/family education;DME instruction PT Recommendation Follow Up Recommendations: Supervision/Assistance - 24 hour;Skilled nursing facility (to be determined based on pt's progress.  ) Equipment Recommended: None recommended by OT PT Goals  Acute Rehab PT Goals PT Goal Formulation: With patient Time For Goal Achievement: 7  days Pt will go Sit to Stand: Independently;with upper extremity assist PT Goal: Sit to Stand - Progress: Goal set today Pt will go Stand to Sit: Independently;with upper extremity assist (demonstrating safe technique) PT Goal: Stand to Sit - Progress: Goal set today Pt will Ambulate: >150 feet;with modified independence;with least restrictive  assistive device PT Goal: Ambulate - Progress: Goal set today Pt will Go Up / Down Stairs: 3-5 stairs;with supervision;with rail(s) PT Goal: Up/Down Stairs - Progress: Goal set today Additional Goals Additional Goal #1: Pt will perform standardized balance test within safe limits.  PT Goal: Additional Goal #1 - Progress: Goal set today  PT Evaluation Precautions/Restrictions  Precautions Precautions: Fall Precaution Comments: Sats with activity Required Braces or Orthoses: No Restrictions Weight Bearing Restrictions: No Prior Functioning  Home Living Lives With: Alone Receives Help From: Personal care attendant;Other (Comment) (5 hours a day ) Type of Home: House Home Layout: One level Home Access: Stairs to enter Entrance Stairs-Rails: Doctor, general practice of Steps: 6 Bathroom Shower/Tub: Forensic scientist: Standard Bathroom Accessibility: No Home Adaptive Equipment: Walker - rolling;Walker - four wheeled;Grab bars around toilet;Grab bars in shower Prior Function Level of Independence: Needs assistance with ADLs;Needs assistance with homemaking Bath: Minimal Dressing: Minimal Meal Prep: Moderate Light Housekeeping: Moderate Able to Take Stairs?: Yes Driving: Yes Vocation: Retired Leisure: Hobbies-no Cognition Cognition Arousal/Alertness: Awake/alert Overall Cognitive Status: Appears within functional limits for tasks assessed Sensation/Coordination   Extremity Assessment RUE Assessment RUE Assessment: Within Functional Limits LUE Assessment LUE Assessment: Within Functional Limits RLE Assessment RLE Assessment: Within Functional Limits LLE Assessment LLE Assessment: Within Functional Limits Mobility (including Balance) Bed Mobility Bed Mobility: Yes Rolling Right: 5: Supervision Right Sidelying to Sit: 5: Supervision Right Sidelying to Sit Details (indicate cue type and reason): with min VCs Sitting - Scoot to Edge of Bed:  7: Independent Transfers Transfers: Yes Sit to Stand: With upper extremity assist;From bed;Other (comment) (min guard A) Sit to Stand Details (indicate cue type and reason): Supervision for safety Stand to Sit: With armrests;With upper extremity assist;To chair/3-in-1;Other (comment) (min guard A) Stand to Sit Details: Repeated verbal and visual cues for hand placement on armrest of chair.  Pt states that she prefers to keep one hand on the walker and one hand on the chair.  Explained to pt why her preferred technique is unsafe demonstrating how the walker can tip over.   Ambulation/Gait Ambulation/Gait: Yes Ambulation/Gait Assistance: 5: Supervision Ambulation Distance (Feet): 150 Feet Assistive device: Rolling walker Gait Pattern: Within Functional Limits Stairs: No Wheelchair Mobility Wheelchair Mobility: No  Posture/Postural Control Posture/Postural Control: No significant limitations Balance Balance Assessed: Yes Static Sitting Balance Static Sitting - Balance Support: Feet supported;No upper extremity supported Static Sitting - Level of Assistance: 7: Independent Static Sitting - Comment/# of Minutes: several minutes while perfroming functional activities.   Static Standing Balance Static Standing - Balance Support: No upper extremity supported Static Standing - Level of Assistance: 5: Stand by assistance (20 seconds eyes open 20 sec eyes closed.  ) Exercise    End of Session PT - End of Session Equipment Utilized During Treatment: Gait belt Activity Tolerance: Patient tolerated treatment well Patient left: in chair;with call bell in reach Nurse Communication: Mobility status for transfers;Mobility status for ambulation General Behavior During Session: [facility] for tasks performed Cognition: [facility] for tasks performed  Jacquilyn Seldon 12/12/2011, 2:40 PM Betzalel Umbarger L. Aunika Kirsten DPT [phone]

## 2011-12-12 NOTE — Progress Notes (Signed)
Utilization review completed. [name] 12/12/2011 

## 2011-12-12 NOTE — Progress Notes (Signed)
ANTICOAGULATION CONSULT NOTE - Follow Up Consult  Pharmacy Consult for Coumadin Indication: atrial fibrillation and porcine aortic valve replacement  Allergies  Allergen Reactions  . Cephalexin     Unsure as to reaction.  ? Hives with Cephalexin; "I can take Penicillin"  . Atorvastatin     Forgetfulness   . Codeine Nausea And Vomiting  . Crestor (Rosuvastatin Calcium)     Forgetfulness    . Estradiol     Unknown reaction  . Hydrocortisone Hives    Flea bites were mistaken as rash from hydrocortisone  . Mevacor Other (See Comments)    forgetfullness,  . Moxifloxacin     Unknown reaction  . Sulfonamide Derivatives     itching    Patient Measurements: Height: 5' 2.5" (158.8 cm) Weight: 131 lb 2.8 oz (59.5 kg) IBW/kg (Calculated) : 51.25    Vital Signs: Temp: 97.8 F (36.6 C) (02/06 0507) BP: 110/64 mmHg (02/06 0933) Pulse Rate: 76  (02/06 0933)  Labs:  Basename 12/12/11 0545 12/11/11 1610 12/10/11 1833 12/10/11 1437 12/10/11 1436  HGB -- 12.3 -- 12.5 --  HCT -- 38.2 -- 38.4 --  PLT -- 212 -- 204 --  APTT -- -- -- -- --  LABPROT 30.1* 22.3* 22.2* -- --  INR 2.82* 1.92* 1.91* -- --  HEPARINUNFRC -- -- -- -- --  CREATININE -- 0.52 -- -- 0.57  CKTOTAL -- -- -- -- --  CKMB -- -- -- -- --  TROPONINI -- -- -- <0.30 --   Estimated Creatinine Clearance: 41.6 ml/min (by C-G formula based on Cr of 0.52).  Assessment: Admit Complaint: Shortness of breath Pharmacist System-Based Medication Review: Anticoagulation Hx porcine aortic valve replacement, Afib. Home Coumadin regimen was 1mg  daily except 0.5mg  on Thursdays per [place] [facility] notes, and reported by patient on admit as 1 mg daily except 0.5mg  Tue/Thu. Protime increased today by ~8 secl. CBC stable. Will hold coumadin today  Infectious Disease AECOPD, outpatient Levaquin x 3 days PTA. Now on empiric Vanc/Zosyn. Blood cx pending. Afebrile, WBC WNL. Scr stable. 2/4 Vanc >> 2/4 Zosyn >> No change to abx  doses.  Cardiovascular VSS, home meds resumed.  Endocrinology Solumedrol taper on board. No hx DM- whole blood glucose elevated  Consider SSI?  Gastrointestinal / Nutrition Low Na diet  Neurology Zoloft PTA, continued  Nephrology Na low, other lytes ok. Scr stable  Pulmonary COPD, O2 dependant  Hematology / Oncology CBC stable  PTA Medication Issues Nexium PTA, resumed  Best Practices Coumadin ,ppi    Goal of Therapy:  INR 2-3   Plan:  1. No coumadin today. 2. F/up daily INR  [facility] Clinical Pharmacist Pager [phone] 12/12/2011 11:14 AM

## 2011-12-13 ENCOUNTER — Encounter (HOSPITAL_COMMUNITY): Payer: Self-pay | Admitting: Radiology

## 2011-12-13 ENCOUNTER — Inpatient Hospital Stay (HOSPITAL_COMMUNITY): Payer: Medicare Other

## 2011-12-13 LAB — CBC
Hemoglobin: 11.6 g/dL — ABNORMAL LOW (ref 12.0–15.0)
MCH: 26.9 pg (ref 26.0–34.0)
MCHC: 31.7 g/dL (ref 30.0–36.0)
MCV: 84.9 fL (ref 78.0–100.0)
Platelets: 234 10*3/uL (ref 150–400)
RBC: 4.31 MIL/uL (ref 3.87–5.11)

## 2011-12-13 LAB — BASIC METABOLIC PANEL
BUN: 22 mg/dL (ref 6–23)
CO2: 34 mEq/L — ABNORMAL HIGH (ref 19–32)
Calcium: 8.6 mg/dL (ref 8.4–10.5)
Glucose, Bld: 177 mg/dL — ABNORMAL HIGH (ref 70–99)
Sodium: 135 mEq/L (ref 135–145)

## 2011-12-13 MED ORDER — LEVALBUTEROL HCL 0.63 MG/3ML IN NEBU
0.6300 mg | INHALATION_SOLUTION | Freq: Four times a day (QID) | RESPIRATORY_TRACT | Status: DC | PRN
  Administered 2011-12-14: 0.63 mg via RESPIRATORY_TRACT
  Filled 2011-12-13: qty 3

## 2011-12-13 MED ORDER — LEVALBUTEROL HCL 0.63 MG/3ML IN NEBU
0.6300 mg | INHALATION_SOLUTION | Freq: Four times a day (QID) | RESPIRATORY_TRACT | Status: DC
  Administered 2011-12-13 – 2011-12-17 (×19): 0.63 mg via RESPIRATORY_TRACT
  Filled 2011-12-13 (×22): qty 3

## 2011-12-13 MED ORDER — VANCOMYCIN HCL IN DEXTROSE 1-5 GM/200ML-% IV SOLN
1000.0000 mg | Freq: Two times a day (BID) | INTRAVENOUS | Status: DC
  Administered 2011-12-14: 1000 mg via INTRAVENOUS
  Filled 2011-12-13 (×2): qty 200

## 2011-12-13 MED ORDER — IPRATROPIUM BROMIDE 0.02 % IN SOLN
0.5000 mg | Freq: Four times a day (QID) | RESPIRATORY_TRACT | Status: DC
  Administered 2011-12-13 – 2011-12-17 (×19): 0.5 mg via RESPIRATORY_TRACT
  Filled 2011-12-13 (×20): qty 2.5

## 2011-12-13 MED ORDER — VANCOMYCIN HCL 500 MG IV SOLR
500.0000 mg | Freq: Once | INTRAVENOUS | Status: AC
  Administered 2011-12-13: 500 mg via INTRAVENOUS
  Filled 2011-12-13: qty 500

## 2011-12-13 MED ORDER — PREDNISONE 50 MG PO TABS
60.0000 mg | ORAL_TABLET | Freq: Every day | ORAL | Status: DC
  Administered 2011-12-13 – 2011-12-17 (×5): 60 mg via ORAL
  Filled 2011-12-13 (×5): qty 1

## 2011-12-13 MED ORDER — GUAIFENESIN ER 600 MG PO TB12
1200.0000 mg | ORAL_TABLET | Freq: Two times a day (BID) | ORAL | Status: DC
  Administered 2011-12-13 – 2011-12-17 (×8): 1200 mg via ORAL
  Filled 2011-12-13 (×9): qty 2

## 2011-12-13 NOTE — Progress Notes (Signed)
PHARMACY CONSULT NOTE - Follow Up Consult  Pharmacy Consult for Coumadin, Vancomycin Indication: atrial fibrillation and porcine aortic valve replacement  Allergies  Allergen Reactions  . Cephalexin     Unsure as to reaction.  ? Hives with Cephalexin; "I can take Penicillin"  . Atorvastatin     Forgetfulness   . Codeine Nausea And Vomiting  . Crestor (Rosuvastatin Calcium)     Forgetfulness    . Estradiol     Unknown reaction  . Hydrocortisone Hives    Flea bites were mistaken as rash from hydrocortisone  . Mevacor Other (See Comments)    forgetfullness,  . Moxifloxacin     Unknown reaction  . Sulfonamide Derivatives     itching    Patient Measurements: Height: 5' 2.5" (158.8 cm) Weight: 131 lb 2.8 oz (59.5 kg) IBW/kg (Calculated) : 51.25    Vital Signs: Temp: 97.1 F (36.2 C) (02/07 1536) Temp src: Axillary (02/07 1536) BP: 126/61 mmHg (02/07 1536) Pulse Rate: 60  (02/07 1536)  Labs:  Basename 12/13/11 0551 12/12/11 0545 12/11/11 0633  HGB 11.6* -- 12.3  HCT 36.6 -- 38.2  PLT 234 -- 212  APTT -- -- --  LABPROT 42.7* 30.1* 22.3*  INR 4.41* 2.82* 1.92*  HEPARINUNFRC -- -- --  CREATININE 0.70 -- 0.52  CKTOTAL -- -- --  CKMB -- -- --  TROPONINI -- -- --   Estimated Creatinine Clearance: 41.6 ml/min (by C-G formula based on Cr of 0.7).  Assessment:  Vancomycin trough = 8.4 mcg/ml on empiric Vancomycin 500mg  IV q12h and Zosyn Day#3 for ?HCAP. Patient is afebrile. WBC elevated to 17.4K.  Blood cultures NGTD.  Goal of Therapy:  Vanc trough 15-20 mcg/ml   Plan:  Increase Vancomycin dose to 1gm IV q12hrs. I spoke to patient's nurse who is about to give the 500mg  IV Vanc dose tonight. I will order extra 500mg  IV Vanc to be given tonight then 1gm IV q12hr.   Arman Filter, [place] 12/13/2011 5:46 PM

## 2011-12-13 NOTE — Progress Notes (Signed)
Physical Therapy Treatment Patient Details Name: [name] MRN: [id number] DOB: 03/27/1924 Today's Date: 12/13/2011  PT Assessment/Plan  PT - Assessment/Plan Comments on Treatment Session: Pt c/o mild SOB and dizziness while ambulating.  Pt reports feeling much better since chest PT this afternoon.    PT Plan: Discharge plan remains appropriate;Frequency needs to be updated PT Frequency: Min 3X/week Follow Up Recommendations: Supervision/Assistance - 24 hour;Skilled nursing facility Equipment Recommended: None recommended by PT PT Goals  Acute Rehab PT Goals PT Goal Formulation: With patient Time For Goal Achievement: 7 days Pt will go Sit to Stand: Independently;with upper extremity assist PT Goal: Sit to Stand - Progress: Progressing toward goal Pt will go Stand to Sit: Independently;with upper extremity assist PT Goal: Stand to Sit - Progress: Progressing toward goal Pt will Ambulate: >150 feet;with modified independence;with least restrictive assistive device PT Goal: Ambulate - Progress: Progressing toward goal Pt will Go Up / Down Stairs: 3-5 stairs;with supervision;with rail(s) PT Goal: Up/Down Stairs - Progress: Not met Additional Goals Additional Goal #1: Pt will perform standardized balance test within safe limits.  PT Goal: Additional Goal #1 - Progress: Not met  PT Treatment Precautions/Restrictions  Precautions Precautions: Fall Precaution Comments: Sats with activity Required Braces or Orthoses: No Restrictions Weight Bearing Restrictions: No Mobility (including Balance) Bed Mobility Bed Mobility: Yes Rolling Right: Not tested (comment) Rolling Left: Not tested (comment) Right Sidelying to Sit: Not tested (comment) Supine to Sit: 6: Modified independent (Device/Increase time);HOB elevated (Comment degrees) (50 degrees due to sob) Sitting - Scoot to Edge of Bed: 7: Independent Sit to Supine: Not Tested (comment) Transfers Transfers: Yes Sit to Stand:  6: Modified independent (Device/Increase time);From bed;With upper extremity assist Stand to Sit: 6: Modified independent (Device/Increase time);To chair/3-in-1;With upper extremity assist Ambulation/Gait Ambulation/Gait: Yes Ambulation/Gait Assistance: 6: Modified independent (Device/Increase time) Ambulation/Gait Assistance Details (indicate cue type and reason): Pt ambulated with rolling walker on 3L O2 via [place].   Ambulation Distance (Feet): 175 Feet Assistive device: Rolling walker Gait Pattern: Decreased stride length Gait velocity: wfl Stairs: No    Exercise    End of Session PT - End of Session Equipment Utilized During Treatment: Gait belt Activity Tolerance: Patient tolerated treatment well Patient left: in bed;with call bell in reach Nurse Communication: Mobility status for transfers;Mobility status for ambulation General Behavior During Session: [facility] for tasks performed Cognition: [facility] for tasks performed  Marshe Shrestha 12/13/2011, 5:12 PM Alwin Lanigan L. Karalynn Cottone DPT [phone]

## 2011-12-13 NOTE — Progress Notes (Signed)
Pt was seen and examined at bedside. I have reviewed labs and vitals which are stable and pt is hemodynamically stable. I agree with the above's assessment and plan.   MAGICK-Square Jowett  [phone]  

## 2011-12-13 NOTE — Progress Notes (Signed)
PHARMACY CONSULT NOTE - Follow Up Consult  Pharmacy Consult for Coumadin, Vancomycin, Zosyn Indication: atrial fibrillation and porcine aortic valve replacement  Allergies  Allergen Reactions  . Cephalexin     Unsure as to reaction.  ? Hives with Cephalexin; "I can take Penicillin"  . Atorvastatin     Forgetfulness   . Codeine Nausea And Vomiting  . Crestor (Rosuvastatin Calcium)     Forgetfulness    . Estradiol     Unknown reaction  . Hydrocortisone Hives    Flea bites were mistaken as rash from hydrocortisone  . Mevacor Other (See Comments)    forgetfullness,  . Moxifloxacin     Unknown reaction  . Sulfonamide Derivatives     itching    Patient Measurements: Height: 5' 2.5" (158.8 cm) Weight: 131 lb 2.8 oz (59.5 kg) IBW/kg (Calculated) : 51.25    Vital Signs: Temp: 97.3 F (36.3 C) (02/07 0632) Temp src: Oral (02/07 0632) BP: 130/70 mmHg (02/07 0632) Pulse Rate: 59  (02/07 0632)  Labs:  Basename 12/13/11 0551 12/12/11 0545 12/11/11 4098 12/10/11 1437 12/10/11 1436  HGB 11.6* -- 12.3 -- --  HCT 36.6 -- 38.2 38.4 --  PLT 234 -- 212 204 --  APTT -- -- -- -- --  LABPROT 42.7* 30.1* 22.3* -- --  INR 4.41* 2.82* 1.92* -- --  HEPARINUNFRC -- -- -- -- --  CREATININE 0.70 -- 0.52 -- 0.57  CKTOTAL -- -- -- -- --  CKMB -- -- -- -- --  TROPONINI -- -- -- <0.30 --   Estimated Creatinine Clearance: 41.6 ml/min (by C-G formula based on Cr of 0.7).  Assessment: Admit Complaint: Shortness of breath Pharmacist System-Based Medication Review: Anticoagulation Hx porcine aortic valve replacement, Afib. Home Coumadin regimen was 1mg  daily except 0.5mg  on Thursdays per [place] [facility] notes, and reported by patient on admit as 1 mg daily except 0.5mg  Tue/Thu. Protime continues to rise- supratherapeutic today, despite held dose last PM- no new interacting med identified. Will hold Coumadin today  Infectious Disease AECOPD, ?HCAP, outpatient Levaquin x 3 days PTA. Now  on empiric Vanc/Zosyn. Vanc at [facility]- unsure of abx plans. Blood cx ngtd. Afebrile, but WBC increased. Scr stable, UOP low. 2/4 Vanc >> 2/4 Zosyn >> No change to abx doses. Will plan to check trough this PM  Cardiovascular VSS, home meds resumed.  Endocrinology Solumedrol taper on board. No hx DM- whole blood glucose elevated. Consider SSI?  Gastrointestinal / Nutrition Low Na diet  Neurology Zoloft PTA, continued  Nephrology Scr stable, lytes ok- UOP low  Pulmonary COPD, O2 dependant. 3L [place]  Hematology / Oncology H/H and plts stable. WBC doubled.  PTA Medication Issues Nexium PTA, resumed  Best Practices Coumadin ,ppi   Goal of Therapy:  INR 2-3 Vanc trough 15-20 mcg/ml   Plan:  1. No coumadin today. 2. F/up daily INR 3. Continue Vanc and Zosyn at current doses. Will plan to check Vanc trough at 1630 today.   Juniel Groene K. Allena Katz, PharmD, BCPS.  Clinical Pharmacist Pager [phone]. 12/13/2011 8:17 AM

## 2011-12-13 NOTE — Progress Notes (Signed)
Physical Therapy Treatment Patient Details Name: [name] MRN: [id number] DOB: October 15, 1924 Today's Date: 12/13/2011  PT Assessment/Plan  PT - Assessment/Plan Comments on Treatment Session: Pt c/o difficulty breathing upon PT arrival. O2 sat in high 80s on 3L O2 via [place]. Pt presents with labored breathing c/o feelling congested and unable to cough anything up. Pt presents in diminished breathsounds in bilateral lower lobes, rhonchi breath sounds in bilateral Upper lobes. Performed postural drainiage with mild chest PT to Left anterior upper,and bilateral posterior upper in sitting, and bilateral lower lobes in sidelying. Pt tolerated well O2 sats improved to 99% pt able to cough up a small amount of thick brownish sputum. RN made aware.  PT Plan: Discharge plan remains appropriate;Frequency remains appropriate PT Frequency: Min 2X/week Follow Up Recommendations: Supervision/Assistance - 24 hour;Skilled nursing facility Equipment Recommended: None recommended by PT PT Goals  Acute Rehab PT Goals PT Goal Formulation: With patient Time For Goal Achievement: 7 days Pt will go Sit to Stand: Independently;with upper extremity assist PT Goal: Sit to Stand - Progress: Not met Pt will go Stand to Sit: Independently;with upper extremity assist PT Goal: Stand to Sit - Progress: Not met Pt will Ambulate: >150 feet;with modified independence;with least restrictive assistive device PT Goal: Ambulate - Progress: Not met Pt will Go Up / Down Stairs: 3-5 stairs;with supervision;with rail(s) PT Goal: Up/Down Stairs - Progress: Not met Additional Goals Additional Goal #1: Pt will perform standardized balance test within safe limits.  PT Goal: Additional Goal #1 - Progress: Not met  PT Treatment Precautions/Restrictions  Precautions Precautions: Fall Precaution Comments: Sats with activity Required Braces or Orthoses: No Restrictions Weight Bearing Restrictions: No Mobility (including  Balance) Bed Mobility Bed Mobility: Yes Rolling Right: 5: Supervision Rolling Right Details (indicate cue type and reason): verbal cues to bend contralateral knee to assist in hip rotation.  Rolling Left: 5: Supervision Rolling Left Details (indicate cue type and reason): verbal cues to bend contralateral knee to assist in hip rotation.  Right Sidelying to Sit: 7: Independent;HOB flat Sitting - Scoot to Edge of Bed: 7: Independent Sit to Supine: 7: Independent;HOB flat Transfers Transfers: No    Exercise    End of Session PT - End of Session Activity Tolerance: Patient tolerated treatment well Patient left: in bed;with call bell in reach Nurse Communication: Other (comment) (pt production of sputum with coughing.) General Behavior During Session: [facility] for tasks performed Cognition: [facility] for tasks performed  Fabiano Ginley 12/13/2011, 4:23 PM Verdean Murin L. Tramon Crescenzo DPT [phone]

## 2011-12-13 NOTE — Progress Notes (Signed)
Patient ID: [name], female   DOB: [birth year], 78 y.o.   MRN: [id number] Subjective: I'm finally bringing up some green stuff.    Objective: Weight change:   Intake/Output Summary (Last 24 hours) at 12/13/11 1447 Last data filed at 12/13/11 0900  Gross per 24 hour  Intake   1058 ml  Output   1301 ml  Net   -243 ml   Blood pressure 130/70, pulse 59, temperature 97.3 F (36.3 C), temperature source Oral, resp. rate 22, height 5' 2.5" (1.588 m), weight 59.5 kg (131 lb 2.8 oz), SpO2 97.00%. Filed Vitals:   12/12/11 2220 12/13/11 0632 12/13/11 0924 12/13/11 1208  BP: 145/71 130/70    Pulse: 70 59    Temp: 97.6 F (36.4 C) 97.3 F (36.3 C)    TempSrc: Oral Oral    Resp: 22 22    Height:      Weight:      SpO2: 96% 97% 96% 97%    Physical Exam: General: No acute distress Lungs: Increased wheeze bilaterally, better air movement. Cardiovascular: Regular rate and rhythm without murmur gallop or rub normal S1 and S2 Abdomen: Nontender,slight distended, soft, bowel sounds positive, no rebound, no ascites, no appreciable mass Extremities: No significant cyanosis, clubbing, or edema bilateral lower extremities  Basic Metabolic Panel:  Lab 12/13/11 4098 12/11/11 0633  NA 135 132*  K 3.6 3.9  CL 96 94*  CO2 34* 28  GLUCOSE 177* 173*  BUN 22 14  CREATININE 0.70 0.52  CALCIUM 8.6 9.5  MG -- --  PHOS -- --   Liver Function Tests:  Lab 12/11/11 0633  AST 22  ALT 25  ALKPHOS 50  BILITOT 0.4  PROT 6.8  ALBUMIN 3.0*   CBC:  Lab 12/13/11 0551 12/11/11 0633 12/10/11 1437 12/06/11 1505  WBC 17.4* 8.1 -- --  NEUTROABS -- -- 7.4 9.6*  HGB 11.6* 12.3 -- --  HCT 36.6 38.2 -- --  MCV 84.9 84.1 -- --  PLT 234 212 -- --   Cardiac Enzymes:  Lab 12/10/11 1437  CKTOTAL --  CKMB --  CKMBINDEX --  TROPONINI <0.30   Coagulation:  Lab 12/13/11 0551 12/12/11 0545 12/11/11 0633 12/10/11 1833  LABPROT 42.7* 30.1* 22.3* 22.2*  INR 4.41* 2.82* 1.92* 1.91*    Urinalysis:  Misc. Labs:  Micro Results: Recent Results (from the past 240 hour(s))  CULTURE, BLOOD (ROUTINE X 2)     Status: Normal (Preliminary result)   Collection Time   12/10/11  5:05 PM      Component Value Range Status Comment   Specimen Description BLOOD ARM LEFT   Final    Special Requests BOTTLES DRAWN AEROBIC AND ANAEROBIC 10CC   Final    Culture  Setup Time [id number]   Final    Culture     Final    Value:        BLOOD CULTURE RECEIVED NO GROWTH TO DATE CULTURE WILL BE HELD FOR 5 DAYS BEFORE ISSUING A FINAL NEGATIVE REPORT   Report Status PENDING   Incomplete   CULTURE, BLOOD (ROUTINE X 2)     Status: Normal (Preliminary result)   Collection Time   12/10/11  5:12 PM      Component Value Range Status Comment   Specimen Description BLOOD FOREARM LEFT   Final    Special Requests BOTTLES DRAWN AEROBIC AND ANAEROBIC 10CC   Final    Culture  Setup Time [id number]   Final    Culture  Final    Value:        BLOOD CULTURE RECEIVED NO GROWTH TO DATE CULTURE WILL BE HELD FOR 5 DAYS BEFORE ISSUING A FINAL NEGATIVE REPORT   Report Status PENDING   Incomplete     Studies/Results:  CT Chest 12/13/11:   Findings: The patient has severe emphysema particularly in the upper lobes. There is a prominent area of parenchymal scarring in the right upper lobe laterally with an adjacent 7 mm nodule. The  appearance is unchanged since 11/01/2011. There are no acute infiltrates or effusions. No acute osseous abnormality. Transvenous pacer in place. Extensive calcification of thoracic aorta and coronary arteries. Visualized portion of the upper abdomen is normal.   IMPRESSION: Severe emphysematous disease with a stable scarring in the right upper lobe. No acute abnormalities.  Scheduled Meds:   . digoxin  125 mcg Oral Daily  . diltiazem  240 mg Oral Daily  . diphenhydrAMINE  12.5 mg Oral QHS  . esomeprazole  40 mg Oral Q1200  . ezetimibe  10 mg Oral Daily  . furosemide  20 mg Oral BID   . guaiFENesin  600 mg Oral BID  . ipratropium  0.5 mg Nebulization QID  . levalbuterol  0.63 mg Nebulization QID  . methylPREDNISolone (SOLU-MEDROL) injection  60 mg Intravenous Q12H  . mometasone-formoterol  2 puff Inhalation BID  . montelukast  10 mg Oral QHS  . piperacillin-tazobactam (ZOSYN)  IV  3.375 g Intravenous Q8H  . potassium chloride SA  20 mEq Oral BID  . senna-docusate  1 tablet Oral BID  . sertraline  50 mg Oral Daily  . sodium chloride  3 mL Intravenous Q12H  . sotalol  80 mg Oral BID  . vancomycin  500 mg Intravenous Q12H  . DISCONTD: ipratropium  0.5 mg Nebulization Q4H  . DISCONTD: ipratropium  0.5 mg Nebulization Q8H  . DISCONTD: levalbuterol  0.63 mg Nebulization Q4H  . DISCONTD: levalbuterol  0.63 mg Nebulization Q8H  . DISCONTD: methylPREDNISolone (SOLU-MEDROL) injection  60 mg Intravenous Q6H   Continuous Infusions:  PRN Meds:.magic mouthwash  Anti-infectives:  Anti-infectives     Start     Dose/Rate Route Frequency Ordered Stop   12/11/11 0500   vancomycin (VANCOCIN) 500 mg in sodium chloride 0.9 % 100 mL IVPB        500 mg 100 mL/hr over 60 Minutes Intravenous Every 12 hours 12/10/11 1822     12/11/11 0100  piperacillin-tazobactam (ZOSYN) IVPB 3.375 g       3.375 g 12.5 mL/hr over 240 Minutes Intravenous Every 8 hours 12/10/11 1822     12/10/11 1630   vancomycin (VANCOCIN) IVPB 1000 mg/200 mL premix        1,000 mg 200 mL/hr over 60 Minutes Intravenous  Once 12/10/11 1629 12/10/11 1814   12/10/11 1630  piperacillin-tazobactam (ZOSYN) IVPB 3.375 g       3.375 g 12.5 mL/hr over 240 Minutes Intravenous  Once 12/10/11 1629 12/10/11 2046          Assessment/Plan: Active Problems:  COPD with exacerbation  Pneumonia  CONSTIPATION  Aortic stenosis  IBS  Pacemaker  Chronic anticoagulation  *  Pneumonia -  HCA PNA on broad spectrum antibiotics Vanc/Zosyn D4.  Bld cultures are pending but show no growth preliminarily.  Need to discuss  antibiotic therapy with attending.  Will check sputum culture.  * Severe Emphysema/COPD - Patient lives on 3.5 Liters of Oxygen at home.  Lowest  ambulating Oxygen sats on  3.5 liters was 87%.  Continue Nebulizers, Flutter Valve, Antibiotics, change solumedrol to prednisone 2/7.  Patient appears improved from admission.  CT Scan 12/13/11 shows severe emphysema.  Chest physiotherapy appears to have helped open her up - better air movement.  * Slightly elevated BNP - on lasix.Marland Kitchen  Has history of Valve replacement and Afib on coumadin. Last Echo 09/2011.  Does not appear to have any fluid overload.  * Constipation -  start Senna/S bid on 2/5.  Patient had 1st BM today (2/7)  * INR Supratheraputic - being managed per pharmacy.  * Dispo:  Patient is from home, lives alone, but was in [place]  for Rehab in November - December 2012.  PT/OT recommend SNF.  Patient will need SNF likely ready to go Friday / Sat.   LOS: 3 days   Stephani Police 12/13/2011, 2:47 PM [phone]

## 2011-12-14 ENCOUNTER — Telehealth: Payer: Self-pay | Admitting: Internal Medicine

## 2011-12-14 LAB — BASIC METABOLIC PANEL
CO2: 32 mEq/L (ref 19–32)
Glucose, Bld: 165 mg/dL — ABNORMAL HIGH (ref 70–99)
Potassium: 3.6 mEq/L (ref 3.5–5.1)
Sodium: 136 mEq/L (ref 135–145)

## 2011-12-14 LAB — CBC
Hemoglobin: 12 g/dL (ref 12.0–15.0)
MCH: 27.2 pg (ref 26.0–34.0)
MCV: 84.6 fL (ref 78.0–100.0)
RBC: 4.41 MIL/uL (ref 3.87–5.11)

## 2011-12-14 MED ORDER — LEVALBUTEROL HCL 1.25 MG/0.5ML IN NEBU
1.2500 mg | INHALATION_SOLUTION | Freq: Four times a day (QID) | RESPIRATORY_TRACT | Status: DC | PRN
  Administered 2011-12-17: 1.25 mg via RESPIRATORY_TRACT
  Filled 2011-12-14: qty 0.5

## 2011-12-14 MED ORDER — MOXIFLOXACIN HCL 400 MG PO TABS
400.0000 mg | ORAL_TABLET | Freq: Every day | ORAL | Status: DC
  Filled 2011-12-14: qty 1

## 2011-12-14 MED ORDER — AMOXICILLIN-POT CLAVULANATE 875-125 MG PO TABS
1.0000 | ORAL_TABLET | Freq: Two times a day (BID) | ORAL | Status: DC
  Administered 2011-12-14 – 2011-12-17 (×7): 1 via ORAL
  Filled 2011-12-14 (×8): qty 1

## 2011-12-14 NOTE — Telephone Encounter (Signed)
Error.  No message needed.  [name] ° °

## 2011-12-14 NOTE — Progress Notes (Signed)
Clinical Social Worker completed psychosocial assessment and placed in shadow chart. Pt agreeable to SNF placement for short term rehabilitation. Pt interested in returning to [facility] as she had previously completed short term rehabilitation there. Clinical Social  Worker initiated [employer] and contacted [facility] to inform of pt interest in returning. [facility] confirmed that they are able to offer pt bed and bed available on Monday. Clinical Social Worker notified pt and pt daughter of [facility] bed offer. Clinical Social Worker to facilitate pt discharge needs when pt medically stable for discharge.  Jacklynn Lewis, MSW, LCSWA  Clinical Social Work [phone]

## 2011-12-14 NOTE — Clinical Documentation Improvement (Signed)
CHF DOCUMENTATION CLARIFICATION QUERY  THIS DOCUMENT IS NOT A PERMANENT PART OF THE MEDICAL RECORD  TO RESPOND TO THE THIS QUERY, FOLLOW THE INSTRUCTIONS BELOW:  1. If needed, update documentation for the patient's encounter via the notes activity.  2. Access this query again and click edit on the In [employer].  3. After updating, or not, click F2 to complete all highlighted (required) fields concerning your review. Select "additional documentation in the medical record" OR "no additional documentation provided".  4. Click Sign note button.  5. The deficiency will fall out of your In Basket *Please let us know if you are not able to complete this workflow by phone or e-mail (listed below).  Please update your documentation within the medical record to reflect your response to this query.                                                                                    12/14/11  Dear Dr. Charline Bills [facility],  In a better effort to capture your patient's severity of illness, reflect appropriate length of stay and utilization of resources, a review of the patient medical record has revealed the following indicators the diagnosis of Heart Failure.    Based on your clinical judgment, please clarify and document in a progress note and/or discharge summary the clinical condition associated with the following supporting information:  In responding to this query please exercise your independent judgment.  The fact that a query is asked, does not imply that any particular answer is desired or expected.  Per H+P documentation patient with "questionable acute diastolic congestive heart failure"... with admitting BNP of 501.6 (12/10/11) down from 944.2 (09/18/11)... Patient continued on home dose of po Lasix 20 mg po bid .  Please clarify if this diagnosis is an active diagnosis being treated during this hospitalization or if it was resolved during the first 24-48 hours of this stay.  Thank  you    Possible Clinical Conditions:  Acute Diastolic Congestive Heart Failure  Acute Diastolic Congestive Heart Failure resolved   Chronic Diastolic Congestive Heart Failure  Chronic Diastolic  Congestive Heart Failure resolved  Other Condition________________________________________  Cannot Clinically Determine  Supporting Information: see note above  Treatment: Lasix po 20 mg bid    Reviewed: This is acute on chronic diastolic CHF and now resolved  Thank You,  Excell Seltzer  Clinical Documentation Specialist RN, BSN, CDS:  Pager [phone] // HIM [phone]   Health Information Management [place]

## 2011-12-14 NOTE — Progress Notes (Signed)
Patient ID: [name], female   DOB: Jun 06, 1924, 78 y.o.   MRN: [id number]  Subjective: From Patient:  "I cant bring up any green stuff today.  You're not going to send me anywhere any time soon are you?"  Spoke with Ms. [name] [phone]), daughter, at the request of the patient.  Ms. [name] conveyed that her mother values length of life over quality of life at this point.  She wants aggressive care to improve.  However, her mother does understand that she has a chronic life limiting illness.  Ms. [name] requests a quick daily phone call to update her on her mothers condition.  Objective: Weight change:   Intake/Output Summary (Last 24 hours) at 12/14/11 1228 Last data filed at 12/14/11 0513  Gross per 24 hour  Intake    640 ml  Output   2750 ml  Net  -2110 ml   Blood pressure 118/64, pulse 64, temperature 97.3 F (36.3 C), temperature source Oral, resp. rate 18, height 5' 2.5" (1.588 m), weight 59.5 kg (131 lb 2.8 oz), SpO2 95.00%. Filed Vitals:   12/14/11 0229 12/14/11 0518 12/14/11 0801 12/14/11 0913  BP:  118/64    Pulse: 79 60  64  Temp:  97.3 F (36.3 C)    TempSrc:  Oral    Resp: 16 18    Height:      Weight:      SpO2: 98% 96% 95%     Physical Exam: General: No acute distress, lying in bed Lungs: slight diffuse wheeze.  Decreased air movement Cardiovascular: Regular rate and rhythm without murmur gallop or rub normal S1 and S2 Abdomen: Nontender,slight distended, soft, bowel sounds positive, no rebound, no ascites, no appreciable mass Extremities: No significant cyanosis, clubbing, or edema bilateral lower extremities  Basic Metabolic Panel:  Lab 12/14/11 4401 12/13/11 0551  NA 136 135  K 3.6 3.6  CL 94* 96  CO2 32 34*  GLUCOSE 165* 177*  BUN 19 22  CREATININE 0.57 0.70  CALCIUM 8.7 8.6  MG -- --  PHOS -- --   Liver Function Tests:  Lab 12/11/11 0633  AST 22  ALT 25  ALKPHOS 50  BILITOT 0.4  PROT 6.8  ALBUMIN 3.0*   CBC:  Lab  12/14/11 0517 12/13/11 0551 12/10/11 1437  WBC 15.8* 17.4* --  NEUTROABS -- -- 7.4  HGB 12.0 11.6* --  HCT 37.3 36.6 --  MCV 84.6 84.9 --  PLT 247 234 --   Cardiac Enzymes:  Lab 12/10/11 1437  CKTOTAL --  CKMB --  CKMBINDEX --  TROPONINI <0.30   Coagulation:  Lab 12/14/11 0517 12/13/11 0551 12/12/11 0545 12/11/11 0633  LABPROT 43.9* 42.7* 30.1* 22.3*  INR 4.57* 4.41* 2.82* 1.92*   Urinalysis:  Misc. Labs:  Micro Results: Recent Results (from the past 240 hour(s))  CULTURE, BLOOD (ROUTINE X 2)     Status: Normal (Preliminary result)   Collection Time   12/10/11  5:05 PM      Component Value Range Status Comment   Specimen Description BLOOD ARM LEFT   Final    Special Requests BOTTLES DRAWN AEROBIC AND ANAEROBIC 10CC   Final    Culture  Setup Time [id number]   Final    Culture     Final    Value:        BLOOD CULTURE RECEIVED NO GROWTH TO DATE CULTURE WILL BE HELD FOR 5 DAYS BEFORE ISSUING A FINAL NEGATIVE REPORT   Report Status PENDING  Incomplete   CULTURE, BLOOD (ROUTINE X 2)     Status: Normal (Preliminary result)   Collection Time   12/10/11  5:12 PM      Component Value Range Status Comment   Specimen Description BLOOD FOREARM LEFT   Final    Special Requests BOTTLES DRAWN AEROBIC AND ANAEROBIC 10CC   Final    Culture  Setup Time [id number]   Final    Culture     Final    Value:        BLOOD CULTURE RECEIVED NO GROWTH TO DATE CULTURE WILL BE HELD FOR 5 DAYS BEFORE ISSUING A FINAL NEGATIVE REPORT   Report Status PENDING   Incomplete     Studies/Results:  CT Chest 12/13/11:   Findings: The patient has severe emphysema particularly in the upper lobes. There is a prominent area of parenchymal scarring in the right upper lobe laterally with an adjacent 7 mm nodule. The  appearance is unchanged since 11/01/2011. There are no acute infiltrates or effusions. No acute osseous abnormality. Transvenous pacer in place. Extensive calcification of thoracic aorta and  coronary arteries. Visualized portion of the upper abdomen is normal.   IMPRESSION: Severe emphysematous disease with a stable scarring in the right upper lobe. No acute abnormalities.  Scheduled Meds:    . amoxicillin-clavulanate  1 tablet Oral Q12H  . digoxin  125 mcg Oral Daily  . diltiazem  240 mg Oral Daily  . diphenhydrAMINE  12.5 mg Oral QHS  . esomeprazole  40 mg Oral Q1200  . ezetimibe  10 mg Oral Daily  . furosemide  20 mg Oral BID  . guaiFENesin  1,200 mg Oral BID  . ipratropium  0.5 mg Nebulization QID  . levalbuterol  0.63 mg Nebulization QID  . mometasone-formoterol  2 puff Inhalation BID  . montelukast  10 mg Oral QHS  . potassium chloride SA  20 mEq Oral BID  . predniSONE  60 mg Oral Daily  . senna-docusate  1 tablet Oral BID  . sertraline  50 mg Oral Daily  . sodium chloride  3 mL Intravenous Q12H  . sotalol  80 mg Oral BID  . vancomycin  500 mg Intravenous Once  . DISCONTD: guaiFENesin  600 mg Oral BID  . DISCONTD: methylPREDNISolone (SOLU-MEDROL) injection  60 mg Intravenous Q12H  . DISCONTD: moxifloxacin  400 mg Oral q1800  . DISCONTD: piperacillin-tazobactam (ZOSYN)  IV  3.375 g Intravenous Q8H  . DISCONTD: vancomycin  500 mg Intravenous Q12H  . DISCONTD: vancomycin  1,000 mg Intravenous Q12H   Continuous Infusions:  PRN Meds:.levalbuterol, magic mouthwash  Anti-infectives:  Anti-infectives     Start     Dose/Rate Route Frequency Ordered Stop   12/14/11 1800   moxifloxacin (AVELOX) tablet 400 mg  Status:  Discontinued        400 mg Oral Daily-1800 12/14/11 1124 12/14/11 1133   12/14/11 1145  amoxicillin-clavulanate (AUGMENTIN) 875-125 MG per tablet 1 tablet       1 tablet Oral Every 12 hours 12/14/11 1133     12/14/11 0600   vancomycin (VANCOCIN) IVPB 1000 mg/200 mL premix  Status:  Discontinued        1,000 mg 200 mL/hr over 60 Minutes Intravenous Every 12 hours 12/13/11 2013 12/14/11 1133   12/13/11 1915   vancomycin (VANCOCIN) 500 mg in sodium  chloride 0.9 % 100 mL IVPB        500 mg 100 mL/hr over 60 Minutes Intravenous  Once 12/13/11 1803  12/13/11 2119   12/11/11 0500   vancomycin (VANCOCIN) 500 mg in sodium chloride 0.9 % 100 mL IVPB  Status:  Discontinued        500 mg 100 mL/hr over 60 Minutes Intravenous Every 12 hours 12/10/11 1822 12/13/11 2012   12/11/11 0100   piperacillin-tazobactam (ZOSYN) IVPB 3.375 g  Status:  Discontinued        3.375 g 12.5 mL/hr over 240 Minutes Intravenous Every 8 hours 12/10/11 1822 12/14/11 1133   12/10/11 1630   vancomycin (VANCOCIN) IVPB 1000 mg/200 mL premix        1,000 mg 200 mL/hr over 60 Minutes Intravenous  Once 12/10/11 1629 12/10/11 1814   12/10/11 1630  piperacillin-tazobactam (ZOSYN) IVPB 3.375 g       3.375 g 12.5 mL/hr over 240 Minutes Intravenous  Once 12/10/11 1629 12/10/11 2046          Assessment/Plan: Active Problems:  COPD with exacerbation  Pneumonia  CONSTIPATION  Aortic stenosis  IBS  Pacemaker  Chronic anticoagulation  *  Pneumonia -  HCA PNA.  Changed to Augmentin after 4 full days of IV Vanc/Zosyn (patient allergic to Avelox).  Bld cultures are negative.    * Severe Emphysema/COPD - Patient lives on 3.5 Liters of Oxygen at home.  Lowest  ambulating Oxygen sats on 3.5 liters was 87%.  Continue Nebulizers, Flutter Valve, Antibiotics, change solumedrol to prednisone 2/7.  Patient appears improved from admission.  CT Scan 12/13/11 shows severe emphysema.  Chest physiotherapy appears to have helped open her up - better air movement. Follow up appointment scheduled with Dr. Maple Hudson 2/25.  * Slightly elevated BNP - on lasix.Marland Kitchen  Has history of Valve replacement, pace maker, and Afib on coumadin. Last Echo 09/2011.  Does not appear to have any fluid overload.  * Constipation -  Chronic IBS on stool softeners.  * INR Supratheraputic - being managed per pharmacy.  * Dispo:  Patient is from home, lives alone, but was in [place]  for Rehab in November - December  2012.  PT/OT recommend SNF.  Patient will need SNF likely ready to go Monday.   LOS: 4 days   Stephani Police 12/14/2011, 12:28 PM [phone]

## 2011-12-14 NOTE — Progress Notes (Signed)
OT Cancellation Note  Treatment cancelled today due to Pt and family currently meeting with MD.  Will check back as time permits.Marland Kitchen  Deajah Erkkila OTR/L Pager number [phone]  12/14/2011, 3:33 PM

## 2011-12-14 NOTE — Progress Notes (Signed)
Pt was seen and examined at bedside. I have reviewed labs and vitals which are stable and pt is hemodynamically stable. I agree with the above's assessment and plan. Placement to SNF pending.  Debbora Presto [phone]

## 2011-12-15 LAB — PROTIME-INR: INR: 4.42 — ABNORMAL HIGH (ref 0.00–1.49)

## 2011-12-15 NOTE — Progress Notes (Signed)
Patient ID: [name], female   DOB: 02-Oct-1924, 78 y.o.   MRN: [id number]  Subjective: No events overnight. Patient denies chest pain, shortness of breath, abdominal pain.  Objective:  Vital signs in last 24 hours:  Filed Vitals:   12/15/11 0911 12/15/11 1248 12/15/11 1430 12/15/11 1520  BP:   96/44 131/65  Pulse: 66  64 64  Temp:   97.2 F (36.2 C)   TempSrc:   Oral   Resp:   20   Height:      Weight:      SpO2:  97% 97%     Intake/Output from previous day:   Intake/Output Summary (Last 24 hours) at 12/15/11 1601 Last data filed at 12/15/11 0900  Gross per 24 hour  Intake    240 ml  Output    851 ml  Net   -611 ml    Physical Exam: General: Alert, awake, oriented x3, in no acute distress. HEENT: No bruits, no goiter. Moist mucous membranes, no scleral icterus, no conjunctival pallor. Heart: Regular rate and rhythm, S1/S2 +, no murmurs, rubs, gallops. Lungs: Clear to auscultation bilaterally with bibasilar crackles and end expiratory wheezing, no rhonchi, no rales.  Abdomen: Soft, nontender, nondistended, positive bowel sounds. Extremities: No clubbing or cyanosis, no pitting edema,  positive pedal pulses. Neuro: Grossly nonfocal.  Lab Results:  Basic Metabolic Panel:    Component Value Date/Time   NA 136 12/14/2011 0517   K 3.6 12/14/2011 0517   CL 94* 12/14/2011 0517   CO2 32 12/14/2011 0517   BUN 19 12/14/2011 0517   CREATININE 0.57 12/14/2011 0517   CREATININE 0.84 11/02/2011 1614   GLUCOSE 165* 12/14/2011 0517   CALCIUM 8.7 12/14/2011 0517   CBC:    Component Value Date/Time   WBC 15.8* 12/14/2011 0517   HGB 12.0 12/14/2011 0517   HCT 37.3 12/14/2011 0517   PLT 247 12/14/2011 0517   MCV 84.6 12/14/2011 0517   NEUTROABS 7.4 12/10/2011 1437   LYMPHSABS 1.0 12/10/2011 1437   MONOABS 0.9 12/10/2011 1437   EOSABS 0.0 12/10/2011 1437   BASOSABS 0.0 12/10/2011 1437      Lab 12/14/11 0517 12/13/11 0551 12/11/11 0633 12/10/11 1437  WBC 15.8* 17.4* 8.1 9.2  HGB 12.0 11.6*  12.3 12.5  HCT 37.3 36.6 38.2 38.4  PLT 247 234 212 204  MCV 84.6 84.9 84.1 83.7  MCH 27.2 26.9 27.1 27.2  MCHC 32.2 31.7 32.2 32.6  RDW 14.6 14.4 14.4 14.6  LYMPHSABS -- -- -- 1.0  MONOABS -- -- -- 0.9  EOSABS -- -- -- 0.0  BASOSABS -- -- -- 0.0  BANDABS -- -- -- --    Lab 12/14/11 0517 12/13/11 0551 12/11/11 0633 12/10/11 1436  NA 136 135 132* 135  K 3.6 3.6 3.9 4.3  CL 94* 96 94* 96  CO2 32 34* 28 30  GLUCOSE 165* 177* 173* 122*  BUN 19 22 14 15   CREATININE 0.57 0.70 0.52 0.57  CALCIUM 8.7 8.6 9.5 9.7  MG -- -- -- --    Lab 12/15/11 0500 12/14/11 0517 12/13/11 0551 12/12/11 0545 12/11/11 0633  INR 4.42* 4.57* 4.41* 2.82* 1.92*  PROTIME -- -- -- -- --   Cardiac markers:  Lab 12/10/11 1437  CKMB --  TROPONINI <0.30  MYOGLOBIN --    Recent Results (from the past 240 hour(s))  CULTURE, BLOOD (ROUTINE X 2)     Status: Normal (Preliminary result)   Collection Time   12/10/11  5:05 PM      Component Value Range Status Comment   Specimen Description BLOOD ARM LEFT   Final    Special Requests BOTTLES DRAWN AEROBIC AND ANAEROBIC 10CC   Final    Culture  Setup Time [id number]   Final    Culture     Final    Value:        BLOOD CULTURE RECEIVED NO GROWTH TO DATE CULTURE WILL BE HELD FOR 5 DAYS BEFORE ISSUING A FINAL NEGATIVE REPORT   Report Status PENDING   Incomplete   CULTURE, BLOOD (ROUTINE X 2)     Status: Normal (Preliminary result)   Collection Time   12/10/11  5:12 PM      Component Value Range Status Comment   Specimen Description BLOOD FOREARM LEFT   Final    Special Requests BOTTLES DRAWN AEROBIC AND ANAEROBIC 10CC   Final    Culture  Setup Time [id number]   Final    Culture     Final    Value:        BLOOD CULTURE RECEIVED NO GROWTH TO DATE CULTURE WILL BE HELD FOR 5 DAYS BEFORE ISSUING A FINAL NEGATIVE REPORT   Report Status PENDING   Incomplete     Studies/Results: No results found.  Medications: Scheduled Meds:   . amoxicillin-clavulanate  1  tablet Oral Q12H  . digoxin  125 mcg Oral Daily  . diltiazem  240 mg Oral Daily  . diphenhydrAMINE  12.5 mg Oral QHS  . esomeprazole  40 mg Oral Q1200  . ezetimibe  10 mg Oral Daily  . furosemide  20 mg Oral BID  . guaiFENesin  1,200 mg Oral BID  . ipratropium  0.5 mg Nebulization QID  . levalbuterol  0.63 mg Nebulization QID  . mometasone-formoterol  2 puff Inhalation BID  . montelukast  10 mg Oral QHS  . potassium chloride SA  20 mEq Oral BID  . predniSONE  60 mg Oral Daily  . senna-docusate  1 tablet Oral BID  . sertraline  50 mg Oral Daily  . sodium chloride  3 mL Intravenous Q12H  . sotalol  80 mg Oral BID   Continuous Infusions:  PRN Meds:.levalbuterol, magic mouthwash, DISCONTD: levalbuterol  Assessment/Plan:  Active Problems:  COPD with exacerbation  Pneumonia  CONSTIPATION  Aortic stenosis  IBS  Pacemaker  Chronic anticoagulation   * Pneumonia - [facility] PNA. Changed to Augmentin after 4 full days of IV Vanc/Zosyn (patient allergic to Avelox). Bld cultures are negative.  * Severe Emphysema/COPD - Patient lives on 3.5 Liters of Oxygen at home. Lowest ambulating Oxygen sats on 3.5 liters was 87%. Continue Nebulizers, Flutter Valve, Antibiotics, changed solumedrol to prednisone 2/7. Patient appears improved from admission. CT Scan 12/13/11 shows severe emphysema. Chest physiotherapy appears to have helped open her up - better air movement. Follow up appointment scheduled with Dr. Maple Hudson 2/25.  * Slightly elevated BNP - on lasix.Marland Kitchen Has history of Valve replacement, pace maker, and Afib on coumadin. Last Echo 09/2011. Does not appear to have any fluid overload.  * Constipation - Chronic IBS on stool softeners.  * INR Supratheraputic - being managed per pharmacy.  * Dispo: Patient is from home, lives alone, but was in [place] for Rehab in November - December 2012. PT/OT recommend SNF. Patient will need SNF likely ready to go Monday.    EDUCATION - test results and diagnostic  studies were discussed with patient and pt's family who was present at  the bedside (daughter) - patient and family have verbalized the understanding - questions were answered at the bedside and contact information was provided for additional questions or concerns   LOS: 5 days   MAGICK-MYERS, ISKRA 12/15/2011, 4:01 PM  [facility] Pager: [phone]

## 2011-12-15 NOTE — Progress Notes (Signed)
PHARMACY CONSULT NOTE - Follow Up Consult  Pharmacy Consult for Coumadin Indication: atrial fibrillation and porcine aortic valve replacement  Allergies  Allergen Reactions  . Cephalexin     Unsure as to reaction.  ? Hives with Cephalexin; "I can take Penicillin"  . Atorvastatin     Forgetfulness   . Codeine Nausea And Vomiting  . Crestor (Rosuvastatin Calcium)     Forgetfulness    . Estradiol     Unknown reaction  . Hydrocortisone Hives    Flea bites were mistaken as rash from hydrocortisone  . Mevacor Other (See Comments)    forgetfullness,  . Moxifloxacin     Unknown reaction  . Sulfonamide Derivatives     itching    Patient Measurements: Height: 5' 2.5" (158.8 cm) Weight: 131 lb 2.8 oz (59.5 kg) IBW/kg (Calculated) : 51.25    Vital Signs: Temp: 98.1 F (36.7 C) (02/09 0437) Temp src: Oral (02/09 0437) BP: 159/73 mmHg (02/09 0437) Pulse Rate: 66  (02/09 0911)  Labs:  Basename 12/15/11 0500 12/14/11 0517 12/13/11 0551  HGB -- 12.0 11.6*  HCT -- 37.3 36.6  PLT -- 247 234  APTT -- -- --  LABPROT 42.8* 43.9* 42.7*  INR 4.42* 4.57* 4.41*  HEPARINUNFRC -- -- --  CREATININE -- 0.57 0.70  CKTOTAL -- -- --  CKMB -- -- --  TROPONINI -- -- --   Estimated Creatinine Clearance: 41.6 ml/min (by C-G formula based on Cr of 0.57).  Assessment 78 yo female with Hx porcine aortic valve replacement, Afib. Home Coumadin regimen was 1mg  daily except 0.5mg  on Thursdays per [facility] notes, and reported by patient on admit as 1 mg daily except 0.5mg  Tue/Thu.  Protime continues  to be supratherapeutic today  Goal of Therapy:  INR=2-3   Plan:   -hold coumadin today  Benny Lennert, RPh 12/15/2011 2:21 PM

## 2011-12-16 LAB — PROTIME-INR: Prothrombin Time: 40.1 seconds — ABNORMAL HIGH (ref 11.6–15.2)

## 2011-12-16 NOTE — Progress Notes (Signed)
PHARMACY CONSULT NOTE - Follow Up Consult  Pharmacy Consult for Coumadin Indication: atrial fibrillation and porcine aortic valve replacement  Allergies  Allergen Reactions  . Cephalexin     Unsure as to reaction.  ? Hives with Cephalexin; "I can take Penicillin"  . Atorvastatin     Forgetfulness   . Codeine Nausea And Vomiting  . Crestor (Rosuvastatin Calcium)     Forgetfulness    . Estradiol     Unknown reaction  . Hydrocortisone Hives    Flea bites were mistaken as rash from hydrocortisone  . Mevacor Other (See Comments)    forgetfullness,  . Moxifloxacin     Unknown reaction  . Sulfonamide Derivatives     itching    Patient Measurements: Height: 5' 2.5" (158.8 cm) Weight: 131 lb 2.8 oz (59.5 kg) IBW/kg (Calculated) : 51.25    Vital Signs: Temp: 98 F (36.7 C) (02/10 0451) Temp src: Oral (02/10 0451) BP: 120/62 mmHg (02/10 0922) Pulse Rate: 80  (02/10 0922)  Labs:  Basename 12/16/11 0650 12/15/11 0500 12/14/11 0517  HGB -- -- 12.0  HCT -- -- 37.3  PLT -- -- 247  APTT -- -- --  LABPROT 40.1* 42.8* 43.9*  INR 4.07* 4.42* 4.57*  HEPARINUNFRC -- -- --  CREATININE -- -- 0.57  CKTOTAL -- -- --  CKMB -- -- --  TROPONINI -- -- --   Estimated Creatinine Clearance: 41.6 ml/min (by C-G formula based on Cr of 0.57).  Assessment 78 yo female with Hx porcine aortic valve replacement, Afib. Home Coumadin regimen was 1mg  daily except 0.5mg  on Thursdays per [facility] notes, and reported by patient on admit as 1 mg daily except 0.5mg  Tue/Thu.  Protime continues  to be supratherapeutic today possibly related to antibiotics.  Goal of Therapy:  INR=2-3   Plan:   -hold coumadin today  Benny Lennert, PharmD 12/16/2011 10:30 AM

## 2011-12-16 NOTE — Progress Notes (Signed)
Patient ID: [name], female   DOB: [birth year], 78 y.o.   MRN: [id number]  Subjective: No events overnight. Patient denies chest pain, shortness of breath, abdominal pain.  Objective:  Vital signs in last 24 hours:  Filed Vitals:   12/16/11 1500 12/16/11 1555 12/16/11 2058 12/16/11 2123  BP: 124/60   131/65  Pulse: 80   75  Temp: 98.2 F (36.8 C)   97 F (36.1 C)  TempSrc: Oral   Oral  Resp: 20   18  Height:      Weight:      SpO2: 97% 97% 98% 96%    Intake/Output from previous day:   Intake/Output Summary (Last 24 hours) at 12/16/11 2154 Last data filed at 12/16/11 1330  Gross per 24 hour  Intake    600 ml  Output   1550 ml  Net   -950 ml    Physical Exam: General: Alert, awake, oriented x3, in no acute distress. HEENT: No bruits, no goiter. Moist mucous membranes, no scleral icterus, no conjunctival pallor. Heart: Regular rate and rhythm, S1/S2 +, no murmurs, rubs, gallops. Lungs: Clear to auscultation bilaterally. No wheezing, no rhonchi, no rales.  Abdomen: Soft, nontender, nondistended, positive bowel sounds. Extremities: No clubbing or cyanosis, no pitting edema,  positive pedal pulses. Neuro: Grossly nonfocal.  Lab Results:  Basic Metabolic Panel:    Component Value Date/Time   NA 136 12/14/2011 0517   K 3.6 12/14/2011 0517   CL 94* 12/14/2011 0517   CO2 32 12/14/2011 0517   BUN 19 12/14/2011 0517   CREATININE 0.57 12/14/2011 0517   CREATININE 0.84 11/02/2011 1614   GLUCOSE 165* 12/14/2011 0517   CALCIUM 8.7 12/14/2011 0517   CBC:    Component Value Date/Time   WBC 15.8* 12/14/2011 0517   HGB 12.0 12/14/2011 0517   HCT 37.3 12/14/2011 0517   PLT 247 12/14/2011 0517   MCV 84.6 12/14/2011 0517   NEUTROABS 7.4 12/10/2011 1437   LYMPHSABS 1.0 12/10/2011 1437   MONOABS 0.9 12/10/2011 1437   EOSABS 0.0 12/10/2011 1437   BASOSABS 0.0 12/10/2011 1437      Lab 12/14/11 0517 12/13/11 0551 12/11/11 0633 12/10/11 1437  WBC 15.8* 17.4* 8.1 9.2  HGB 12.0 11.6* 12.3 12.5  HCT  37.3 36.6 38.2 38.4  PLT 247 234 212 204  MCV 84.6 84.9 84.1 83.7  MCH 27.2 26.9 27.1 27.2  MCHC 32.2 31.7 32.2 32.6  RDW 14.6 14.4 14.4 14.6  LYMPHSABS -- -- -- 1.0  MONOABS -- -- -- 0.9  EOSABS -- -- -- 0.0  BASOSABS -- -- -- 0.0  BANDABS -- -- -- --    Lab 12/14/11 0517 12/13/11 0551 12/11/11 0633 12/10/11 1436  NA 136 135 132* 135  K 3.6 3.6 3.9 4.3  CL 94* 96 94* 96  CO2 32 34* 28 30  GLUCOSE 165* 177* 173* 122*  BUN 19 22 14 15   CREATININE 0.57 0.70 0.52 0.57  CALCIUM 8.7 8.6 9.5 9.7  MG -- -- -- --    Lab 12/16/11 0650 12/15/11 0500 12/14/11 0517 12/13/11 0551 12/12/11 0545  INR 4.07* 4.42* 4.57* 4.41* 2.82*  PROTIME -- -- -- -- --   Cardiac markers:  Lab 12/10/11 1437  CKMB --  TROPONINI <0.30  MYOGLOBIN --   No components found with this basename: POCBNP:3 Recent Results (from the past 240 hour(s))  CULTURE, BLOOD (ROUTINE X 2)     Status: Normal (Preliminary result)   Collection Time  12/10/11  5:05 PM      Component Value Range Status Comment   Specimen Description BLOOD ARM LEFT   Final    Special Requests BOTTLES DRAWN AEROBIC AND ANAEROBIC 10CC   Final    Culture  Setup Time [id number]   Final    Culture     Final    Value:        BLOOD CULTURE RECEIVED NO GROWTH TO DATE CULTURE WILL BE HELD FOR 5 DAYS BEFORE ISSUING A FINAL NEGATIVE REPORT   Report Status PENDING   Incomplete   CULTURE, BLOOD (ROUTINE X 2)     Status: Normal (Preliminary result)   Collection Time   12/10/11  5:12 PM      Component Value Range Status Comment   Specimen Description BLOOD FOREARM LEFT   Final    Special Requests BOTTLES DRAWN AEROBIC AND ANAEROBIC 10CC   Final    Culture  Setup Time [id number]   Final    Culture     Final    Value:        BLOOD CULTURE RECEIVED NO GROWTH TO DATE CULTURE WILL BE HELD FOR 5 DAYS BEFORE ISSUING A FINAL NEGATIVE REPORT   Report Status PENDING   Incomplete     Studies/Results: No results found.  Medications: Scheduled Meds:   .  amoxicillin-clavulanate  1 tablet Oral Q12H  . digoxin  125 mcg Oral Daily  . diltiazem  240 mg Oral Daily  . diphenhydrAMINE  12.5 mg Oral QHS  . esomeprazole  40 mg Oral Q1200  . ezetimibe  10 mg Oral Daily  . furosemide  20 mg Oral BID  . guaiFENesin  1,200 mg Oral BID  . ipratropium  0.5 mg Nebulization QID  . levalbuterol  0.63 mg Nebulization QID  . mometasone-formoterol  2 puff Inhalation BID  . montelukast  10 mg Oral QHS  . potassium chloride SA  20 mEq Oral BID  . predniSONE  60 mg Oral Daily  . senna-docusate  1 tablet Oral BID  . sertraline  50 mg Oral Daily  . sodium chloride  3 mL Intravenous Q12H  . sotalol  80 mg Oral BID   Continuous Infusions:  PRN Meds:.levalbuterol, magic mouthwash  Assessment/Plan:  Active Problems:  COPD with exacerbation  Pneumonia  CONSTIPATION  Aortic stenosis  IBS  Pacemaker  Chronic anticoagulation   * Pneumonia - [facility] PNA. Changed to Augmentin after 4 full days of IV Vanc/Zosyn (patient allergic to Avelox). Bld cultures are negative.   * Severe Emphysema/COPD - Patient lives on 3.5 Liters of Oxygen at home. Lowest ambulating Oxygen sats on 3.5 liters was 87%. Continue Nebulizers, Flutter Valve, Antibiotics, changed solumedrol to prednisone 2/7. Patient appears improved from admission. CT Scan 12/13/11 shows severe emphysema. Chest physiotherapy appears to have helped open her up - better air movement. Follow up appointment scheduled with Dr. Maple Hudson 2/25.   * Slightly elevated BNP - on lasix.Marland Kitchen Has history of Valve replacement, pace maker, and Afib on coumadin. Last Echo 09/2011. Does not appear to have any fluid overload.   * Constipation - Chronic IBS on stool softeners.   * INR Supratheraputic - being managed per pharmacy.   * Dispo: Patient is from home, lives alone, but was in [place] for Rehab in November - December 2012. PT/OT recommend SNF. Patient will need SNF likely ready to go Monday.   EDUCATION  - test results and  diagnostic studies were discussed with patient and pt's  family who was present at the bedside (daughter)  - patient and family have verbalized the understanding  - questions were answered at the bedside and contact information was provided for additional questions or concerns   LOS: 6 days   MAGICK-Trigg Delarocha 12/16/2011, 9:54 PM  [facility] Pager: [phone]

## 2011-12-17 ENCOUNTER — Inpatient Hospital Stay (HOSPITAL_COMMUNITY): Payer: Medicare Other

## 2011-12-17 LAB — CBC
HCT: 35.4 % — ABNORMAL LOW (ref 36.0–46.0)
MCHC: 32.2 g/dL (ref 30.0–36.0)
Platelets: 217 10*3/uL (ref 150–400)
RDW: 14.4 % (ref 11.5–15.5)
WBC: 16.6 10*3/uL — ABNORMAL HIGH (ref 4.0–10.5)

## 2011-12-17 LAB — BASIC METABOLIC PANEL
Chloride: 91 mEq/L — ABNORMAL LOW (ref 96–112)
GFR calc Af Amer: >90 mL/min (ref >90)
GFR calc non Af Amer: 84 mL/min — ABNORMAL LOW (ref >90)
Potassium: 3.4 mEq/L — ABNORMAL LOW (ref 3.5–5.1)
Sodium: 136 mEq/L (ref 135–145)

## 2011-12-17 LAB — CULTURE, BLOOD (ROUTINE X 2)
Culture  Setup Time: 201302050033
Culture: NO GROWTH

## 2011-12-17 LAB — PROTIME-INR
INR: 2.42 — ABNORMAL HIGH (ref 0.00–1.49)
Prothrombin Time: 26.7 seconds — ABNORMAL HIGH (ref 11.6–15.2)

## 2011-12-17 MED ORDER — AMOXICILLIN-POT CLAVULANATE 875-125 MG PO TABS: 1.0000 | ORAL_TABLET | Freq: Two times a day (BID) | ORAL | Status: AC

## 2011-12-17 MED ORDER — GUAIFENESIN ER 1200 MG PO TB12: 1200.0000 mg | ORAL_TABLET | Freq: Two times a day (BID) | ORAL | Status: DC

## 2011-12-17 MED ORDER — PREDNISONE 20 MG PO TABS: 60.0000 mg | ORAL_TABLET | Freq: Every day | ORAL | Status: AC

## 2011-12-17 MED ORDER — MOMETASONE FUROATE 0.1 % EX CREA: TOPICAL_CREAM | Freq: Every day | CUTANEOUS | Status: DC

## 2011-12-17 MED ORDER — WARFARIN SODIUM 2 MG PO TABS
2.0000 mg | ORAL_TABLET | Freq: Once | ORAL | Status: DC
  Filled 2011-12-17: qty 1

## 2011-12-17 MED ORDER — MOMETASONE FURO-FORMOTEROL FUM 200-5 MCG/ACT IN AERO: 2.0000 | INHALATION_SPRAY | Freq: Two times a day (BID) | RESPIRATORY_TRACT | Status: DC

## 2011-12-17 MED ORDER — LEVALBUTEROL HCL 1.25 MG/3ML IN NEBU: 1.0000 | INHALATION_SOLUTION | Freq: Three times a day (TID) | RESPIRATORY_TRACT | Status: DC

## 2011-12-17 NOTE — Discharge Summary (Addendum)
Patient ID: [name] MRN: [id number] DOB/AGE: [birth year] 76 y.o.  Admit date: 12/10/2011 Discharge date: 12/17/2011  Primary Care Physician:  [name], MD, MD Pulmonologist:  Dr. Maple Hudson.  Discharge Diagnoses:   Present on Admission:  .COPD with exacerbation .Pneumonia - Health Care Associated. .CONSTIPATION .Aortic stenosis .Chronic anticoagulation .Pacemaker .IBS    Medication List  As of 12/17/2011  4:29 PM   STOP taking these medications         levofloxacin 500 MG tablet      methylPREDNISolone 4 MG tablet      mometasone-formoterol 100-5 MCG/ACT Aero         TAKE these medications         amoxicillin-clavulanate 875-125 MG per tablet   Commonly known as: AUGMENTIN   Take 1 tablet by mouth every 12 (twelve) hours.   Start taking on: 12/18/2011      CHILDRENS ALLERGY 12.5 MG/5ML liquid   Generic drug: diphenhydrAMINE   Take 12.5 mg by mouth at bedtime.      digoxin 0.125 MG tablet   Commonly known as: LANOXIN   Take 125 mcg by mouth daily.      diltiazem 240 MG 24 hr capsule   Commonly known as: CARDIZEM CD   Take 240 mg by mouth daily.      esomeprazole 40 MG capsule   Commonly known as: NEXIUM   Take 40 mg by mouth daily before breakfast.      ezetimibe 10 MG tablet   Commonly known as: ZETIA   Take 10 mg by mouth daily.      furosemide 40 MG tablet   Commonly known as: LASIX   Take 20 mg by mouth 2 (two) times daily.      Guaifenesin 1200 MG Tb12   Take 1 tablet (1,200 mg total) by mouth 2 (two) times daily at 10 AM and 5 PM.      IRON PO   Take 1 tablet by mouth daily.      levalbuterol 1.25 MG/3ML nebulizer solution   Commonly known as: XOPENEX   Take 1.25 mg by nebulization 3 (three) times daily. DX:  493.20      magic mouthwash Soln   Take 5 mLs by mouth 4 (four) times daily as needed. For mouth irritations      mometasone 0.1 % cream   Commonly known as: ELOCON   Apply topically daily.      Mometasone Furo-Formoterol  Fum 200-5 MCG/ACT Aero   Inhale 2 puffs into the lungs 2 (two) times daily.      montelukast 10 MG tablet   Commonly known as: SINGULAIR   Take 10 mg by mouth at bedtime.      potassium chloride SA 20 MEQ tablet   Commonly known as: K-DUR,KLOR-CON   Take 20 mEq by mouth 2 (two) times daily.      predniSONE 20 MG tablet   Commonly known as: DELTASONE   Take 3 tablets (60 mg total) by mouth daily.      sertraline 50 MG tablet   Commonly known as: ZOLOFT   Take 50 mg by mouth daily.      sotalol 80 MG tablet   Commonly known as: BETAPACE   Take 80 mg by mouth 2 (two) times daily.      STOOL SOFTENER PO   Take 100 mg by mouth daily.      tiotropium 18 MCG inhalation capsule   Commonly known as: SPIRIVA  Place 18 mcg into inhaler and inhale daily.      warfarin 1 MG tablet   Commonly known as: COUMADIN   Take 0.5-1 mg by mouth daily. Take 0.5 tablet on Tuesday & Thursday  Take 1 tablet on all other days      XOPENEX HFA 45 MCG/ACT inhaler   Generic drug: levalbuterol   Inhale 2 puffs into the lungs 2 (two) times daily.            Brief H and P: From the admission note:  78 year old female with a history of COPD, chronic respiratory failure, O2 dependent, atrial fibrillation, history of aortic valve replacement came to the hospital for worsening of shortness of breath for last 3 days. Patient usually follows pulmonary Dr. Maple Hudson as outpatient, she was given antibiotics last week but did not improve so he change antibiotics 3 days ago to Levaquin. Patient says that she has not felt better so she came to the hospital. She admits to be coughing, no fever, positive nausea but no vomiting. Patient also has a poor by mouth intake over the past 2 days. In the ER patient received antibiotics Solu-Medrol and nebulizer treatments, and she's feeling better at this time. She denies any chest pain.   Hospital Course:  As this was the second time within 3 months the patient had had  pneumonia it was considered healthcare associated pneumonia. The patient was admitted and started on IV vancomycin and Zosyn.  She was treated with IV steroids nebulizers and flutter valve.  She was very slow to improve consequently CT chest was done that showed simply severe emphysema.  After 4 days of IV antibiotics she was changed to Augmentin and will continue this for a full 10 day course of antibiotics which will end on February 13.  What seemed to help Ms. Thone in addition to her IV antibiotics and steroids was chest physiotherapy.  She received the vibration vest 4 times a day.  This allowed her to begin coughing up sputum and moving air much more easily.    After several days on IV antibiotics the patient's INR was elevated, it was closely monitored and has now returned to 2.4  well within normal range.  We recommend while she finishes her course of antibiotics her INR be monitored closely.  The patient's other comorbidities including coronary artery disease and IBS were quiet during this hospitalization. She has followup scheduled with Dr. Griffin Basil her pulmonologist, as well as her cardiologist.  As the patient is somewhat deconditioned it was felt by physical therapy that she should not be at home alone she'll be discharged to skilled nursing facility for short-term rehabilitation.  It is strongly recommended that the patient consider a consult for palliative care for symptom management. Her daughter [name] felt that this would best be presented by Dr. Maple Hudson (Pulmonologist) with whom she has an excellent relationship.     Physical Exam on Discharge: General: Alert, awake, oriented x3, in no acute distress, sitting in chair HEENT: No bruits, no goiter. Heart: Regular rate and rhythm, without murmurs, rubs, gallops. Lungs: minimal wheeze on posterior right.  O/w CTA no increased work of breathing. Coughing up small amounts of  green/brown of mucous Abdomen: Soft, nontender, nondistended,  positive bowel sounds. Extremities: No clubbing cyanosis or edema with positive pedal pulses. Neuro: Grossly intact, nonfocal.  Filed Vitals:   12/17/11 0900 12/17/11 0952 12/17/11 1100 12/17/11 1500  BP:    140/70  Pulse:  85  65  Temp:    97.6 F (36.4 C)  TempSrc:    Oral  Resp:    20  Height:      Weight:      SpO2: 97% 91% 98% 98%     Intake/Output Summary (Last 24 hours) at 12/17/11 1629 Last data filed at 12/17/11 1500  Gross per 24 hour  Intake    480 ml  Output   2700 ml  Net  -2220 ml    Basic Metabolic Panel:  Lab 12/17/11 1610 12/14/11 0517  NA 136 136  K 3.4* 3.6  CL 91* 94*  CO2 36* 32  GLUCOSE 109* 165*  BUN 14 19  CREATININE 0.53 0.57  CALCIUM 9.0 8.7  MG -- --  PHOS -- --   Liver Function Tests:  Lab 12/11/11 0633  AST 22  ALT 25  ALKPHOS 50  BILITOT 0.4  PROT 6.8  ALBUMIN 3.0*   CBC:  Lab 12/17/11 0543 12/14/11 0517  WBC 16.6* 15.8*  NEUTROABS -- --  HGB 11.4* 12.0  HCT 35.4* 37.3  MCV 84.7 84.6  PLT 217 247   Cardiac Enzymes: No results found for this basename: CKTOTAL:3,CKMB:3,CKMBINDEX:3,TROPONINI:3 in the last 168 hours  Coagulation:  Lab 12/17/11 0543 12/16/11 0650 12/15/11 0500 12/14/11 0517  LABPROT 26.7* 40.1* 42.8* 43.9*  INR 2.42* 4.07* 4.42* 4.57*     Other Pertinent Lab Results:    Significant Diagnostic Studies:   Dg Chest 2 View  12/06/2011  *RADIOLOGY REPORT*    IMPRESSION:  1.  Some increase in perihilar and bibasilar infiltrates or edema. 2.  Persistent focal peripheral airspace opacity in the right upper lobe. 3.  Stable cardiomegaly and postop changes.    Dg Abd 1 View  12/12/2011  *RADIOLOGY REPORT*  Clinical Data: Abdominal distention, evaluate for ascites  ABDOMEN - 1 VIEW  Comparison: Portable abdomen of 03/10/2009  Findings: A supine film of the abdomen shows no evidence of bowel obstruction.  No definite plain film evidence of constipation is seen.  Opacity in the region of the stomach in the left  upper quadrant may be ingested, not being present on the prior plain film.  Calcification cannot be excluded.  The bones are diffusely osteopenic.  IMPRESSION: No bowel obstruction.  No plain film evidence of constipation.   Ct Chest Wo Contrast  12/13/2011  *RADIOLOGY REPORT*  Clinical Data: Increasing shortness of breath.  Chest x-ray dated 12/10/2011 and chest CT dated 11/01/2011  CT CHEST WITHOUT CONTRAST   IMPRESSION: Severe emphysematous disease with a stable scarring in the right upper lobe.  No acute abnormalities.    US Abdomen Limited  12/12/2011  *RADIOLOGY REPORT*  Clinical Data: Abdominal distention.  Question ascites.  LIMITED ABDOMINAL ULTRASOUND  Comparison:  None.  Findings: Evaluation of the four quadrants of the abdomen demonstrates no ascites.  IMPRESSION: Negative for ascites.    Dg Chest 2 View  12/10/2011  *RADIOLOGY REPORT*  Clinical Data: Weakness and shortness of breath.  CHEST - 2 VIEW  Comparison: .  IMPRESSION:  1.  Slight improved lung aeration with some persistent patchy infiltrates. 2.  Stable right upper lobe lung opacity. 3.  Stable emphysematous changes.     Dg Chest 2 View  12/17/2011  *RADIOLOGY REPORT*  Clinical Data: Evaluate pneumonia  CHEST - 2 VIEW  Comparison: CT from 12/10/2011  Findings: There is a right chest wall pacer device with lead in the right atrial appendage and right ventricle.  The heart size appears normal.  No pleural effusion or edema identified.  There are coarsened interstitial markings identified bilaterally consistent with COPD.  Right upper lobe subpleural opacity is obscured by the right chest wall pacer device.  IMPRESSION:  1.  No change compared with previous exam. 2.  Emphysema.     Disposition and Follow-up: much improved stable for discharge to skilled nursing facility  Discharge Orders    Future Appointments: Provider: Department: Dept Phone: Center:   12/27/2011 9:25 AM Amber Caryl Bis, RN [facility] [phone]  [facility]   12/31/2011 11:00 AM Waymon Budge, MD [facility] [phone] None   03/20/2012 3:00 PM Duke Salvia, MD [facility] [phone] [facility]     Future Orders Please Complete By Expires   Diet - low sodium heart healthy           Increase activity slowly per Physical Therapy     Discharge instructions      Comments:   Check INR on Wednesday 2/13 as it fluctuates while patient is on her antibiotic. Follow up with Dr. Maple Hudson Pulmonologist on 2/25   Increase activity slowly      Discharge instructions      Comments:   PLEASE NOTE  MEDICATIONS AND DO NOT SUBSTITUTE.  (XOPENEX AND MUCINEX)     Follow-up Information    Follow up with Waymon Budge, MD on 12/31/2011. (11:00 am )    Contact information:   [address] [employer] [place] [address] [phone]       Please follow up. (PLEASE PERFORM CHEST PHYSIOTHERAPY  4 TIMES A DAY as needed starting 12/18/11)       Please follow up. (PLEASE SEE PRIMARY MD AT NURSING FACILITY  ON OR BEFORE 12/20/11)          Time spent on Discharge: 40 min.  SignedStephani Police 12/17/2011, 4:29 PM [phone]

## 2011-12-17 NOTE — Progress Notes (Signed)
Clinical Social Worker facilitated pt discharge needs including contacting facility and arranging ambulance transportation. Pt daughter at bedside and notified of transportation arranged and questions clarified. No further social work needs at this time. Clinical Social Worker signing off.  Jacklynn Lewis, MSW, LCSWA  Clinical Social Work [phone]

## 2011-12-17 NOTE — Progress Notes (Signed)
Physical Therapy Treatment Patient Details Name: [name] MRN: [id number] DOB: 1924-07-03 Today's Date: 12/17/2011  PT Assessment/Plan  PT - Assessment/Plan Comments on Treatment Session: Pt adm with COPD exacerbation and PNA.  Pt making steady progress but still with low activity tolerance. Follow Up Recommendations: Skilled nursing facility Equipment Recommended: None recommended by PT PT Goals  Acute Rehab PT Goals PT Goal: Sit to Stand - Progress: Progressing toward goal PT Goal: Stand to Sit - Progress: Progressing toward goal PT Goal: Ambulate - Progress: Progressing toward goal  PT Treatment Precautions/Restrictions  Precautions Precautions: Fall Precaution Comments: Sats with activity Required Braces or Orthoses: No Restrictions Weight Bearing Restrictions: No Mobility (including Balance) Transfers Sit to Stand: From toilet;With upper extremity assist;6: Modified independent (Device/Increase time) Sit to Stand Details (indicate cue type and reason): Pt up in bathroom. Stand to Sit: 6: Modified independent (Device/Increase time);With upper extremity assist;To chair/3-in-1;With armrests Ambulation/Gait Ambulation/Gait Assistance: 5: Supervision Ambulation/Gait Assistance Details (indicate cue type and reason): 1 standing rest break Ambulation Distance (Feet): 150 Feet Assistive device: Rolling walker Gait Pattern: Decreased stride length Gait velocity: 1.62 ft/sec which is appropriate for neighborhood Engineer, petroleum Standing - Balance Support: No upper extremity supported Static Standing - Level of Assistance: 5: Stand by assistance Dynamic Standing Balance Dynamic Standing - Balance Support: Bilateral upper extremity supported (on walker) Dynamic Standing - Level of Assistance: 5: Stand by assistance Exercise    End of Session PT - End of Session Activity Tolerance: Patient limited by fatigue Patient left: in chair;with call bell  in reach Nurse Communication: Mobility status for ambulation General Behavior During Session: [facility] for tasks performed Cognition: [facility] for tasks performed  [facility] 12/17/2011, 11:23 AM  [facility] PT [phone]

## 2011-12-17 NOTE — Discharge Summary (Addendum)
Pt was seen and examined at bedside. I have reviewed labs and vitals which are stable and pt is hemodynamically stable. I agree with the above's assessment and plan.   [name]  [phone]  ACUTE ON CHRONIC DIASTOLIC CHF

## 2011-12-17 NOTE — Progress Notes (Signed)
[name] to be D/C'd Skilled nursing facility per MD order.  Discussed with the patient and all questions fully answered.   [name], Been  Home Medication Instructions ZOX:[id number]   Printed on:12/17/11 1405  Medication Information                    diphenhydrAMINE (CHILDRENS ALLERGY) 12.5 MG/5ML liquid Take 12.5 mg by mouth at bedtime.            Docusate Calcium (STOOL SOFTENER PO) Take 100 mg by mouth daily.            warfarin (COUMADIN) 1 MG tablet Take 0.5-1 mg by mouth daily. Take 0.5 tablet on Tuesday & Thursday Take 1 tablet on all other days           levalbuterol (XOPENEX HFA) 45 MCG/ACT inhaler Inhale 2 puffs into the lungs 2 (two) times daily.            Alum & Mag Hydroxide-Simeth (MAGIC MOUTHWASH) SOLN Take 5 mLs by mouth 4 (four) times daily as needed. For mouth irritations            potassium chloride SA (K-DUR,KLOR-CON) 20 MEQ tablet Take 20 mEq by mouth 2 (two) times daily.            furosemide (LASIX) 40 MG tablet Take 20 mg by mouth 2 (two) times daily.             IRON PO Take 1 tablet by mouth daily.             montelukast (SINGULAIR) 10 MG tablet Take 10 mg by mouth at bedtime.           tiotropium (SPIRIVA) 18 MCG inhalation capsule Place 18 mcg into inhaler and inhale daily.           esomeprazole (NEXIUM) 40 MG capsule Take 40 mg by mouth daily before breakfast.           ezetimibe (ZETIA) 10 MG tablet Take 10 mg by mouth daily.           digoxin (LANOXIN) 0.125 MG tablet Take 125 mcg by mouth daily.           diltiazem (CARDIZEM CD) 240 MG 24 hr capsule Take 240 mg by mouth daily.           sertraline (ZOLOFT) 50 MG tablet Take 50 mg by mouth daily.           sotalol (BETAPACE) 80 MG tablet Take 80 mg by mouth 2 (two) times daily.           mometasone-formoterol (DULERA) 100-5 MCG/ACT AERO Inhale 2 puffs into the lungs 2 (two) times daily.           predniSONE (DELTASONE) 20 MG tablet Take 3 tablets (60 mg total)  by mouth daily.           amoxicillin-clavulanate (AUGMENTIN) 875-125 MG per tablet Take 1 tablet by mouth every 12 (twelve) hours.           levalbuterol (XOPENEX) 1.25 MG/3ML nebulizer solution Take 1.25 mg by nebulization 3 (three) times daily. DX:  493.20           Guaifenesin (MUCINEX MAXIMUM STRENGTH) 1200 MG TB12 Take 1 tablet (1,200 mg total) by mouth 2 (two) times daily at 10 AM and 5 PM.             VVS, Skin clean, dry and intact without evidence  of skin break down, no evidence of skin tears noted. IV catheter discontinued intact. Site without signs and symptoms of complications. Dressing and pressure applied.  An After Visit Summary was printed and given to the patient. Patient escorted via PTAR, and D/C to SNF.  [name] Ray 12/17/2011 2:05 PM

## 2011-12-17 NOTE — Progress Notes (Signed)
PHARMACY CONSULT NOTE - Follow Up Consult  Pharmacy Consult for Coumadin Indication: atrial fibrillation and porcine aortic valve replacement  Allergies  Allergen Reactions  . Cephalexin     Unsure as to reaction.  ? Hives with Cephalexin; "I can take Penicillin"  . Atorvastatin     Forgetfulness   . Codeine Nausea And Vomiting  . Crestor (Rosuvastatin Calcium)     Forgetfulness    . Estradiol     Unknown reaction  . Hydrocortisone Hives    Flea bites were mistaken as rash from hydrocortisone  . Mevacor Other (See Comments)    forgetfullness,  . Moxifloxacin     Unknown reaction  . Sulfonamide Derivatives     itching    Patient Measurements: Height: 5' 2.5" (158.8 cm) Weight: 131 lb 2.8 oz (59.5 kg) IBW/kg (Calculated) : 51.25    Vital Signs: Temp: 97.5 F (36.4 C) (02/11 0531) Temp src: Oral (02/11 0531) BP: 143/74 mmHg (02/11 0531) Pulse Rate: 63  (02/11 0531)  Labs:  Basename 12/17/11 0543 12/16/11 0650 12/15/11 0500  HGB 11.4* -- --  HCT 35.4* -- --  PLT 217 -- --  APTT -- -- --  LABPROT 26.7* 40.1* 42.8*  INR 2.42* 4.07* 4.42*  HEPARINUNFRC -- -- --  CREATININE 0.53 -- --  CKTOTAL -- -- --  CKMB -- -- --  TROPONINI -- -- --   Estimated Creatinine Clearance: 41.6 ml/min (by C-G formula based on Cr of 0.53).  Assessment 78 yo female with Hx porcine aortic valve replacement, Afib. Home Coumadin regimen was 1mg  daily except 0.5mg  on Thursdays per [facility] notes, and reported by patient on admit as 1 mg daily except 0.5mg  Tue/Thu.  INR now sub-therapeutic at 2.42  Goal of Therapy:  INR=2-3   Plan:   -Coumadin 2 mg po x 1 today -Follow up AM INR  Elwin Sleight, PharmD 12/17/2011 10:36 AM

## 2011-12-17 NOTE — Progress Notes (Signed)
Occupational Therapy Treatment Patient Details Name: [name] MRN: [id number] DOB: 10-20-1924 Today's Date: 12/17/2011 [id number]  OT Assessment/Plan OT Assessment/Plan Comments on Treatment Session: This 78 yo female making progress slowly. Will continue to benefit from acute OT with follow-up OT at SNF. OT Plan: Discharge plan needs to be updated OT Frequency: Min 2X/week Follow Up Recommendations: Skilled nursing facility Equipment Recommended: Defer to next venue OT Goals ADL Goals ADL Goal: Lower Body Dressing - Progress: Progressing toward goals Miscellaneous OT Goals OT Goal: Miscellaneous Goal #1 - Progress: Progressing toward goals  OT Treatment Precautions/Restrictions  Precautions Precautions: Fall Precaution Comments: O2 dependent pta Required Braces or Orthoses: No Restrictions Weight Bearing Restrictions: No   ADL ADL Eating/Feeding: Not assessed Grooming: Not assessed Upper Body Bathing: Not assessed Lower Body Bathing: Simulated;Supervision/safety;Set up Lower Body Bathing Details (indicate cue type and reason): with AE (LH sponge) Where Assessed - Lower Body Bathing: Supported;Sitting, chair Upper Body Dressing: Not assessed Lower Body Dressing: Performed;Supervision/safety;Set up Lower Body Dressing Details (indicate cue type and reason): with AE (reacher and sock aide) Where Assessed - Lower Body Dressing: Supported;Sitting, chair Toilet Transfer: Not assessed Toilet Transfer Method: Not assessed Toileting - Clothing Manipulation: Not assessed Toileting - Hygiene: Not assessed Tub/Shower Transfer: Not assessed Equipment Used: Reacher;Sock aid;Long-handled sponge ADL Comments: Educated pt on pursed lipped breathing, pt states she is aware of this from a cardiac rehab standpoint; however do see her using it as consistently as she should. Made pt aware she can get AE kit from the gift shop. Mobility  Bed Mobility Bed Mobility:  No Transfers Transfers: No Sit to Stand: From toilet;With upper extremity assist;6: Modified independent (Device/Increase time) Sit to Stand Details (indicate cue type and reason): Pt up in bathroom. Stand to Sit: 6: Modified independent (Device/Increase time);With upper extremity assist;To chair/3-in-1;With armrests Exercises    End of Session OT - End of Session Equipment Utilized During Treatment: Other (comment) (AE) Activity Tolerance: Patient limited by fatigue Patient left: in chair;with call bell in reach;Other (comment) (phone within reach) General Behavior During Session: [facility] for tasks performed Cognition: [facility] for tasks performed  Evette Georges [phone] 12/17/2011, 12:20 PM

## 2011-12-18 DIAGNOSIS — I5033 Acute on chronic diastolic (congestive) heart failure: Secondary | ICD-10-CM | POA: Diagnosis present

## 2011-12-18 NOTE — Progress Notes (Signed)
12/18/2011 Jonah Nestle SPARKS Case Management Note [phone]  Utilization review completed.  

## 2011-12-19 ENCOUNTER — Telehealth: Payer: Self-pay | Admitting: Internal Medicine

## 2011-12-20 NOTE — Discharge Summary (Signed)
Pt was seen and examined at bedside. I have reviewed labs and vitals which are stable and pt is hemodynamically stable. I agree with the above's assessment and plan.   MAGICK-Nusayba Cadenas  [phone]  

## 2011-12-21 ENCOUNTER — Telehealth: Payer: Self-pay | Admitting: Internal Medicine

## 2011-12-21 NOTE — Telephone Encounter (Signed)
Left message to call office call was in reference to a fax we received.

## 2011-12-21 NOTE — Telephone Encounter (Signed)
Patient's daughter, Wyvonnia Dusky, states our office called the patient and left a message. She does not know what it was regarding. Requesting we call patient's other daughter, Ezra Sites, to discuss medical issues b/c she has better understanding of medical terminology.

## 2011-12-24 NOTE — Telephone Encounter (Signed)
Left message to call office. Per fax received from [facility] home health Dr hopper would like to verify which physician is monitoring the PT/INR results last in chart was on 12-17-11.   Pt daughter return call Per Pt daughter Pt is now at [facility] and they are monitoring Pt INR level.

## 2011-12-27 ENCOUNTER — Encounter: Payer: Medicare Other | Admitting: *Deleted

## 2011-12-31 ENCOUNTER — Encounter: Payer: Self-pay | Admitting: Internal Medicine

## 2011-12-31 ENCOUNTER — Encounter: Payer: Self-pay | Admitting: *Deleted

## 2011-12-31 ENCOUNTER — Other Ambulatory Visit (INDEPENDENT_AMBULATORY_CARE_PROVIDER_SITE_OTHER): Payer: Medicare Other

## 2011-12-31 ENCOUNTER — Ambulatory Visit (INDEPENDENT_AMBULATORY_CARE_PROVIDER_SITE_OTHER): Payer: Medicare Other

## 2011-12-31 ENCOUNTER — Ambulatory Visit (INDEPENDENT_AMBULATORY_CARE_PROVIDER_SITE_OTHER): Payer: Medicare Other | Admitting: Internal Medicine

## 2011-12-31 VITALS — BP 120/70 | HR 73 | Wt 129.4 lb

## 2011-12-31 DIAGNOSIS — J301 Allergic rhinitis due to pollen: Secondary | ICD-10-CM

## 2011-12-31 DIAGNOSIS — J309 Allergic rhinitis, unspecified: Secondary | ICD-10-CM

## 2011-12-31 DIAGNOSIS — J42 Unspecified chronic bronchitis: Secondary | ICD-10-CM

## 2011-12-31 DIAGNOSIS — I4891 Unspecified atrial fibrillation: Secondary | ICD-10-CM

## 2011-12-31 DIAGNOSIS — J449 Chronic obstructive pulmonary disease, unspecified: Secondary | ICD-10-CM

## 2011-12-31 LAB — CBC WITH DIFFERENTIAL/PLATELET
Basophils Relative: 0 % (ref 0.0–3.0)
Eosinophils Absolute: 0 10*3/uL (ref 0.0–0.7)
Eosinophils Relative: 0.2 % (ref 0.0–5.0)
HCT: 35.4 % — ABNORMAL LOW (ref 36.0–46.0)
Lymphs Abs: 0.5 10*3/uL — ABNORMAL LOW (ref 0.7–4.0)
MCHC: 32.3 g/dL (ref 30.0–36.0)
MCV: 84.9 fl (ref 78.0–100.0)
Monocytes Absolute: 0.5 10*3/uL (ref 0.1–1.0)
Neutrophils Relative %: 90.1 % — ABNORMAL HIGH (ref 43.0–77.0)
RBC: 4.17 Mil/uL (ref 3.87–5.11)
WBC: 10.4 10*3/uL (ref 4.5–10.5)

## 2011-12-31 MED ORDER — HYDROCODONE-HOMATROPINE 5-1.5 MG/5ML PO SYRP: 2.5000 mL | ORAL_SOLUTION | Freq: Four times a day (QID) | ORAL | Status: AC | PRN

## 2011-12-31 NOTE — Progress Notes (Signed)
Patient ID: [name], female    DOB: 12-07-1923, 78 y.o.   MRN: [id number]  HPI  07/23/2011-86 yoF former smoker followed for COPD, chronic hypoxic respiratory failure, complicated by atrial fibrillation, valvular heart disease and intermittent hemoptysis. She blames rain and pollen this week for nasal congestion. Minor epistaxis despite saline spray. Short of breath with her reading but that's not really different. Coughing a little scant yellow sputum, not bad. This happened about the time she changed to generic Singulair so she's not sure if it's a medication effect but does not feel as if she has cold. Continues oxygen at 3-3.5 L.  09/25/11- 86 yoF former smoker followed for COPD, chronic hypoxic respiratory failure, complicated by atrial fibrillation, valvular heart disease and intermittent hemoptysis Post hospital- 11/10-11/16/12- she was discharged with diagnoses COPD with exacerbation, acute on chronic respiratory failure on chronic oxygen, presumed community-acquired pneumonia, acute diastolic congestive heart failure, atrial fibrillation, history of aortic valve replacement, hypertension, history of coronary artery disease. She had had some blood in her sputum again. Chest x-ray had shown previous CABG and a right chest wall pacemaker, normal heart size, peripheral opacity within the right mid lung which was new with left pleural effusion and left lower lobe airspace consolidation. The concern was for multifocal pneumonia. She still feels especially unsteady if she tries to walk with her walker. There still is some blood streak in her sputum after treatment with broad-spectrum antibiotics in hospital. She expresses strong preference for brand-name Xopenex nebulizer solution rather than generic which she does not consider is effective.   10/25/11- 63 yoF former smoker followed for COPD, chronic hypoxic respiratory failure, complicated by atrial fibrillation, valvular heart disease and  intermittent hemoptysis After last hosp, she spent 4 weeks in rehab SNF. Feels much stronger. Some edema of feet. Last CT had indicated chronic pneumonia/ infiltrates- reviewed with her.  CT chest 09/25/11- IMPRESSION:  1. There are bilateral areas of peripheral and subpleural  consolidation within the right upper lobe, right middle lobe and  posterior medial left lung base. The appearance is nonspecific  favoring inflammatory or infectious etiologies. Underlying  malignancy cannot be excluded and interval follow-up is can be  necessary to ensure complete resolution. Recommend follow-up  imaging and 3-6 weeks after appropriate antibiotic therapy. The  study of choice would be a noncontrast CT of the chest.  2. Advanced emphysema.  Original Report Authenticated By: Rosealee Albee, M.D.   12/06/11- 36 yoF former smoker followed for COPD, chronic hypoxic respiratory failure, complicated by atrial fibrillation, valvular heart disease and intermittent hemoptysis   Daughter here She has not felt particularly in the last several days. We had sent Augmentin January 29 and she has taken about 4 doses. She is blowing her nose a lot, mostly clear mucus. Cough productive of yellow sputum now turning darker. Thinks she might have had some fever this morning with no chills. Has felt weak, somewhat nauseated. Describes a sharp pain intermittently in the left lower anterolateral chest and left upper quadrant of the abdomen, related to deep breath and movement but not to meals.  12/31/11  43 yoF former smoker followed for COPD, chronic hypoxic respiratory failure, complicated by atrial fibrillation/ coumadin, valvular heart disease and intermittent hemoptysis   Daughters here Post hosp visit 12/10/11- 12/17/11- for AECOPD, Rx'd Vanc/ Zosyn and discharged on Augmentin ending 12/19/11. Denies fever now. Cough productive sticky green to yellow sputum with no blood. Denies chest pain. Doing physical therapy At [facility]  Burn. Using VEST 3x/day, Flutter device. These do help clear secretions. Continues Xopenex nebulizer An inhaler with Dulera 200 and Spiriva. She would like to continue allergy shots but is in a nursing facility now for rehabilitation after her pneumonia.  ROS-see HPI Constitutional:   No-   weight loss, night sweats,  ?fevers, chills,  +fatigue, lassitude. HEENT:   No-  headaches, difficulty swallowing, tooth/dental problems, sore throat,       No-  sneezing, itching, ear ache,   +nasal congestion, post nasal drip,  CV:  +  chest pain, orthopnea, PND, swelling in lower extremities, anasarca, dizziness, palpitations Resp: + shortness of breath with exertion or at rest.              +  productive cough,  No non-productive cough,  No- coughing up of blood.              + change in color of mucus.  No- wheezing.   Skin: No-   rash or lesions. GI:  No-   heartburn, indigestion, abdominal pain, nausea, vomiting, diarrhea,                 change in bowel habits, loss of appetite GU:  MS:  No- acute  joint pain or swelling.  No- decreased range of motion.  No- back pain. Neuro-     nothing unusual Psych:  No- change in mood or affect. + depression or anxiety.  No memory loss.  Objective:   Physical ExamSATURATION QUALIFICATIONS: Patient Saturations on Room Air at Rest = 83% Patient Saturations on Room Air while Ambulating = 82% Patient Saturations on 3 Liters of oxygen while Ambulating = 92%---Katie Welchel,CMA General- Alert, Oriented, Affect-appropriate, Distress- none acute   O2 3 L/M, wheelchair. Talkative. Skin- rash-none, lesions- none, excoriation- none Lymphadenopathy- none Head- atraumatic            Eyes- Gross vision intact, PERRLA, conjunctivae clear secretions            Ears- Hearing, canals-normal for age            Nose- Clear, no-Septal dev, mucus, polyps, erosion, perforation             Throat- Mallampati II-III , mucosa clear , drainage- none, tonsils- atrophic Neck-  flexible , trachea midline, no stridor , thyroid nl, carotid no bruit Chest - symmetrical excursion , unlabored           Heart/CV- RRR , 2-3/6 systolic LSB murmur , no gallop  , no rub, nl s1 s2                           - JVD- 1cm , edema- trace, stasis changes- none, varices- none           Lung- bilateral wheeze, harsh cough , dullness-none, rub- none,  Bilateral mild rhonchi           Chest wall- not tender left lower ribs Abd- tender-no, distended-no, bowel sounds-present, HSM- no Br/ Gen/ Rectal- Not done, not indicated Extrem- cyanosis- none, clubbing, none, atrophy- none, strength- nl Neuro- grossly intact to observation

## 2011-12-31 NOTE — Patient Instructions (Addendum)
Order- lab- PT/ INR, CBC w/ diff     Dx Atrial fib                     Sputum for C&S, gram stain    Dx chronic bronchitis  Use Oxygen 3-4 L/M   Sample nasonex  1-2 puffs each nostril once daily as needed  Try saline nasal gel as needed

## 2012-01-01 ENCOUNTER — Other Ambulatory Visit: Payer: Medicare Other

## 2012-01-01 ENCOUNTER — Ambulatory Visit (INDEPENDENT_AMBULATORY_CARE_PROVIDER_SITE_OTHER): Payer: Medicare Other | Admitting: *Deleted

## 2012-01-01 ENCOUNTER — Encounter: Payer: Self-pay | Admitting: Internal Medicine

## 2012-01-01 DIAGNOSIS — I495 Sick sinus syndrome: Secondary | ICD-10-CM

## 2012-01-01 DIAGNOSIS — I4891 Unspecified atrial fibrillation: Secondary | ICD-10-CM

## 2012-01-03 ENCOUNTER — Telehealth: Payer: Self-pay | Admitting: *Deleted

## 2012-01-03 NOTE — Telephone Encounter (Signed)
I let Dr.Young know yesterday that [name] is in a nursing home and she'd run out of vac.Marland Kitchen He said not to make anymore until she came home. Pt. Was still coming in for shots as she was able. I called her daughter [name] to let her know; she was going to see her mother she said she would tell her then.

## 2012-01-03 NOTE — Progress Notes (Signed)
Quick Note:  LMTCB ______ 

## 2012-01-04 ENCOUNTER — Telehealth: Payer: Self-pay | Admitting: Internal Medicine

## 2012-01-04 LAB — REMOTE PACEMAKER DEVICE
AL AMPLITUDE: 1.4 mv
AL IMPEDENCE PM: 488 Ohm
BAMS-0001: 170 {beats}/min
BATTERY VOLTAGE: 3 V
RV LEAD AMPLITUDE: 5.6 mv
RV LEAD IMPEDENCE PM: 408 Ohm

## 2012-01-04 LAB — RESPIRATORY CULTURE OR RESPIRATORY AND SPUTUM CULTURE

## 2012-01-04 NOTE — Assessment & Plan Note (Signed)
Controlled ventricular response rate. Plan-coagulation labs, CBC, sputum culture.

## 2012-01-04 NOTE — Assessment & Plan Note (Signed)
We're going to hold allergy vaccine until she is back at her regular environment. At her age I am not convinced the allergy shots or making much difference.

## 2012-01-04 NOTE — Telephone Encounter (Signed)
Returned call. [phone]. [name]

## 2012-01-04 NOTE — Telephone Encounter (Signed)
Hemoglobin is stable, insignificantly low at 11.4 ( NL 12). WBC is normal, not showing infection. INR is in treatment range at 2.8   LMTCB.

## 2012-01-04 NOTE — Assessment & Plan Note (Signed)
We discussed oxygen management. Plan-oxygen 4 L with exertion, 3 L at rest and sleep.

## 2012-01-07 ENCOUNTER — Other Ambulatory Visit: Payer: Self-pay | Admitting: *Deleted

## 2012-01-07 MED ORDER — EZETIMIBE 10 MG PO TABS: 10.0000 mg | ORAL_TABLET | Freq: Every day | ORAL | Status: DC

## 2012-01-07 NOTE — Telephone Encounter (Signed)
Per CY-ok to live with family if they can handle her and He can't predict life expectancy-maybe 6 months to 1 year. I spoke with [facility] regarding CY's response. She is aware and will call if there is anything we can do.

## 2012-01-07 NOTE — Telephone Encounter (Signed)
Hemoglobin is stable, insignificantly low at 11.4 ( NL 12). WBC is normal, not showing infection. INR is in treatment range at 2.8   Pts daughter aware of results. Neysa Bonito would like to know if CY recommends patient having clock care or okay with pt living with daughter; also Neysa Bonito mentioned that patient would like to know how long CY thinks she has to live (will help with her decision to go to assisted living or stay with daughter and how much of a rebound she may have when the weather gets warmer. Christy aware I will send to CY to address and call back with answer/response.

## 2012-01-07 NOTE — Telephone Encounter (Signed)
[name] (daughter)  Please cb at  [phone] [name]

## 2012-01-09 NOTE — Progress Notes (Signed)
Quick Note:  Pt's daughter is aware of results. ______ 

## 2012-01-15 ENCOUNTER — Encounter: Payer: Self-pay | Admitting: *Deleted

## 2012-01-18 ENCOUNTER — Telehealth: Payer: Self-pay | Admitting: Internal Medicine

## 2012-01-18 NOTE — Telephone Encounter (Signed)
Daughter [name] had called for results of last sputum cx and to let us know Mom had fallen at nursing home. CXR showed persistent densities c/w pneumonia. She has been startred on levaquin I responded LMOM [id number]. - Sputum cx pseudomonas sens to levquin. Asked they let us know how she does.

## 2012-01-23 ENCOUNTER — Telehealth: Payer: Self-pay | Admitting: Internal Medicine

## 2012-01-23 DIAGNOSIS — J449 Chronic obstructive pulmonary disease, unspecified: Secondary | ICD-10-CM

## 2012-01-23 NOTE — Telephone Encounter (Signed)
Spoke with [place] Caster is questioning if CY feels that patient will need round the clock care or if it is okay for the family to leave her at home occasionally to go to the bank or store as needed. Also, pt is scheduled to leave nursing home April 3rd and needs referral to [facility] hospice. Please advise. [name] is aware we will have response Thursday morning as Cy is out of the office this afternoon.

## 2012-01-23 NOTE — Telephone Encounter (Signed)
lmomtcb x1 forkathy 

## 2012-01-23 NOTE — Telephone Encounter (Signed)
Pt's daughter, [name],  returned call & can be reached at [phone].  [name]

## 2012-01-24 ENCOUNTER — Telehealth: Payer: Self-pay | Admitting: Internal Medicine

## 2012-01-24 NOTE — Telephone Encounter (Signed)
I suggest Dr Alwyn Ren as her general doctor. Please refer these questions to him.

## 2012-01-24 NOTE — Telephone Encounter (Signed)
Received today Hospice referral from dr. Fannie Knee for this Patient , Need to Know if Dr Alwyn Ren will be attending Physician Also needs to know if you would like their Physicians help with system management. Please call when you can   Phone # [phone] [name])

## 2012-01-24 NOTE — Telephone Encounter (Signed)
Yes to both questions.

## 2012-01-24 NOTE — Telephone Encounter (Signed)
Dr.Hopper please advise 

## 2012-01-24 NOTE — Telephone Encounter (Signed)
[name] (pt's daughter) is aware of recs from [facility] and aware that referral for Hospice has been placed.

## 2012-01-24 NOTE — Telephone Encounter (Signed)
[name] with Hospice aware that Dr. Maple Hudson does not wish to be attending for Hospice care and to contact pt's PCP, Dr. Alwyn Ren.

## 2012-01-24 NOTE — Telephone Encounter (Signed)
Dr. Maple Hudson, do you want to be the attending physician for hospice or defer this to Dr. Alwyn Ren, pt's PCP? Pls advise. Also, if you are attending, is it okay for hospice doctor to help with symptom management?

## 2012-01-24 NOTE — Telephone Encounter (Signed)
At this point I would think she could be left alone at times. That is a day by day judgment call by the family and patient.  Order- [facility]- please refer to [facility] for dx COPD, anticipate less than 6 months survival.

## 2012-01-24 NOTE — Telephone Encounter (Signed)
.  left message to have patient return my call.  

## 2012-01-25 NOTE — Telephone Encounter (Signed)
Office Message [address] [place], [place] [address] p. [phone] f. [phone] To: [place] Fax: [phone] From: [facility] Date/ Time: 01/25/2012 11:29 AM Taken By: Yehuda Savannah, CSR Caller: [name] Facility: not collected Patient: [name], [name] DOB: Mar 23, 1924 Phone: [phone] Reason for Call: ATTENTION:!!!!!!!!!!! [name] (Daughter) is Returning [name]'s call she left a voice message on [name]'s (DOB: October 25, 1924) voice mail, but she is in Rehab right now, so [name] called to see what [name] needed. [name] can be reached today (January 25, 2012) at [phone] Work. Regarding Appointment: Appt Date: Appt Time: Unknown Provider: Reason: Details: Outcome:

## 2012-01-25 NOTE — Progress Notes (Signed)
Remote pacer check  

## 2012-01-28 ENCOUNTER — Ambulatory Visit (INDEPENDENT_AMBULATORY_CARE_PROVIDER_SITE_OTHER): Payer: Medicare Other | Admitting: Internal Medicine

## 2012-01-28 ENCOUNTER — Encounter: Payer: Self-pay | Admitting: Internal Medicine

## 2012-01-28 VITALS — BP 108/62 | HR 63 | Ht 62.5 in | Wt 134.2 lb

## 2012-01-28 DIAGNOSIS — J449 Chronic obstructive pulmonary disease, unspecified: Secondary | ICD-10-CM

## 2012-01-28 DIAGNOSIS — J301 Allergic rhinitis due to pollen: Secondary | ICD-10-CM

## 2012-01-28 DIAGNOSIS — I509 Heart failure, unspecified: Secondary | ICD-10-CM

## 2012-01-28 DIAGNOSIS — I5033 Acute on chronic diastolic (congestive) heart failure: Secondary | ICD-10-CM

## 2012-01-28 NOTE — Patient Instructions (Signed)
Please call as needed  The bacterium cultured from your sputum was a highly resistant organism called Pseudomonas

## 2012-01-28 NOTE — Progress Notes (Signed)
Patient ID: [name], female    DOB: 12-07-1923, 78 y.o.   MRN: [id number]  HPI  07/23/2011-78 yoF former smoker followed for COPD, chronic hypoxic respiratory failure, complicated by atrial fibrillation, valvular heart disease and intermittent hemoptysis. She blames rain and pollen this week for nasal congestion. Minor epistaxis despite saline spray. Short of breath with her reading but that's not really different. Coughing a little scant yellow sputum, not bad. This happened about the time she changed to generic Singulair so she's not sure if it's a medication effect but does not feel as if she has cold. Continues oxygen at 3-3.5 L.  09/25/11- 78 yoF former smoker followed for COPD, chronic hypoxic respiratory failure, complicated by atrial fibrillation, valvular heart disease and intermittent hemoptysis Post hospital- 11/10-11/16/12- she was discharged with diagnoses COPD with exacerbation, acute on chronic respiratory failure on chronic oxygen, presumed community-acquired pneumonia, acute diastolic congestive heart failure, atrial fibrillation, history of aortic valve replacement, hypertension, history of coronary artery disease. She had had some blood in her sputum again. Chest x-ray had shown previous CABG and a right chest wall pacemaker, normal heart size, peripheral opacity within the right mid lung which was new with left pleural effusion and left lower lobe airspace consolidation. The concern was for multifocal pneumonia. She still feels especially unsteady if she tries to walk with her walker. There still is some blood streak in her sputum after treatment with broad-spectrum antibiotics in hospital. She expresses strong preference for brand-name Xopenex nebulizer solution rather than generic which she does not consider is effective.   10/25/11- 78 yoF former smoker followed for COPD, chronic hypoxic respiratory failure, complicated by atrial fibrillation, valvular heart disease and  intermittent hemoptysis After last hosp, she spent 4 weeks in rehab SNF. Feels much stronger. Some edema of feet. Last CT had indicated chronic pneumonia/ infiltrates- reviewed with her.  CT chest 09/25/11- IMPRESSION:  1. There are bilateral areas of peripheral and subpleural  consolidation within the right upper lobe, right middle lobe and  posterior medial left lung base. The appearance is nonspecific  favoring inflammatory or infectious etiologies. Underlying  malignancy cannot be excluded and interval follow-up is can be  necessary to ensure complete resolution. Recommend follow-up  imaging and 3-6 weeks after appropriate antibiotic therapy. The  study of choice would be a noncontrast CT of the chest.  2. Advanced emphysema.  Original Report Authenticated By: Rosealee Albee, M.D.   12/06/11- 78 yoF former smoker followed for COPD, chronic hypoxic respiratory failure, complicated by atrial fibrillation, valvular heart disease and intermittent hemoptysis   Daughter here She has not felt particularly in the last several days. We had sent Augmentin January 29 and she has taken about 4 doses. She is blowing her nose a lot, mostly clear mucus. Cough productive of yellow sputum now turning darker. Thinks she might have had some fever this morning with no chills. Has felt weak, somewhat nauseated. Describes a sharp pain intermittently in the left lower anterolateral chest and left upper quadrant of the abdomen, related to deep breath and movement but not to meals.  12/31/11  78 yoF former smoker followed for COPD, chronic hypoxic respiratory failure, complicated by atrial fibrillation/ coumadin, valvular heart disease and intermittent hemoptysis   Daughters here Post hosp visit 12/10/11- 12/17/11- for AECOPD, Rx'd Vanc/ Zosyn and discharged on Augmentin ending 12/19/11. Denies fever now. Cough productive sticky green to yellow sputum with no blood. Denies chest pain. Doing physical therapy At [facility]  Burn. Using VEST 3x/day, Flutter device. These do help clear secretions. Continues Xopenex nebulizer An inhaler with Dulera 200 and Spiriva. She would like to continue allergy shots but is in a nursing facility now for rehabilitation after her pneumonia.  01/28/12- 78 yoF former smoker followed for COPD, chronic hypoxic respiratory failure, complicated by atrial fibrillation/ coumadin, valvular heart disease and intermittent hemoptysis. Daughter here Discharge from hospital 12/17/2011 with a primary diagnosis of COPD exacerbation, HCAP, AS Has been living at [facility]. Sputum for 26 2013 had grown Pseudomonas. Nursing home started Levaquin which he took for 7 days despite nausea. Nursing home reports chest x-ray 3 or 4 days ago was now "clear". She has chronic cough and is using Vest and Flutter for pulmonary toilet. Uses oxygen 2-4 L per minute. She and daughter had questions about Hospice and we discussed end-of-life.  ROS-see HPI Constitutional:   No-   weight loss, night sweats,  ?fevers, chills,  +fatigue, lassitude. HEENT:   No-  headaches, difficulty swallowing, tooth/dental problems, sore throat,       No-  sneezing, itching, ear ache,   +nasal congestion, post nasal drip,  CV:  +  chest pain, orthopnea, PND, swelling in lower extremities, anasarca, dizziness, palpitations Resp: + shortness of breath with exertion or at rest.              +  productive cough,  No non-productive cough,  No- coughing up of blood.              + change in color of mucus.  No- wheezing.   Skin: No-   rash or lesions. GI:  + heartburn, indigestion,  No-abdominal pain, nausea, vomiting,  GU:  MS:  No- acute  joint pain or swelling.  Neuro-     nothing unusual Psych:  No- change in mood or affect. + depression or anxiety.  No memory loss.  Objective:   Physical ExamSATURATION QUALIFICATIONS: Patient Saturations on Room Air at Rest = 83% Patient Saturations on Room Air while Ambulating =  82% Patient Saturations on 3 Liters of oxygen while Ambulating = 92%---Katie Welchel,CMA General- Alert, Oriented, Affect-appropriate, Distress- none acute   O2 3 L/M, wheelchair. Talkative. Skin- rash-none, lesions- none, excoriation- none Lymphadenopathy- none Head- atraumatic            Eyes- Gross vision intact, PERRLA, conjunctivae clear secretions            Ears- Hearing, canals-normal for age            Nose- Clear, no-Septal dev, mucus, polyps, erosion, perforation             Throat- Mallampati II-III , mucosa clear , drainage- none, tonsils- atrophic Neck- flexible , trachea midline, no stridor , thyroid nl, carotid no bruit Chest - symmetrical excursion , unlabored           Heart/CV- RRR , 1-2/6 systolic LSB murmur , no gallop  , no rub, nl s1 s2                           - JVD- 1cm , edema+ 1-2+/ elastic hose, stasis changes- none, varices- none           Lung- faintl wheeze, harsh cough , dullness-none, rub- none,  Bilateral mild rhonchi           Chest wall- not tender left lower ribs Abd- tender-no, distended-no, bowel sounds-present, HSM- no Br/  Gen/ Rectal- Not done, not indicated Extrem- cyanosis- none, clubbing, none, atrophy- none, strength- nl Neuro- grossly intact to observation

## 2012-01-29 ENCOUNTER — Telehealth: Payer: Self-pay | Admitting: *Deleted

## 2012-01-29 NOTE — Telephone Encounter (Signed)
Noted vm from daughter [name] [phone]  advising that her mother received a vm and wanted a clarification as to the contents of that vm, per notes MD Alwyn Ren was to be setting pt up for a hospice referral, note in chart on 01-24-12, please advise

## 2012-01-29 NOTE — Telephone Encounter (Signed)
Discuss with patient daughter 

## 2012-01-29 NOTE — Telephone Encounter (Signed)
It is my understanding that Dr. Maple Hudson had referred her to hospice. I will be happy to work with their physicians in any capacity needed.

## 2012-01-31 NOTE — Assessment & Plan Note (Signed)
Peripheral edema is multifactorial. There is probably some cor pulmonale as well as dependent edema from prolonged sitting. She has had marginal renal function and will not tolerate aggressive diuresis.

## 2012-01-31 NOTE — Assessment & Plan Note (Addendum)
She has a chronic bronchitis pattern. Last chest x-ray reportedly was clear, taken at nursing home. She has pulmonary toilet assistance if needed with her Vest and Flutter. I recommended that he talk with hospice, in order to understand available services and decided they want to enroll at this time. She will not return to independent living.

## 2012-01-31 NOTE — Assessment & Plan Note (Signed)
Allergy vaccine stopped and course complete.

## 2012-02-04 ENCOUNTER — Telehealth: Payer: Self-pay | Admitting: Internal Medicine

## 2012-02-04 ENCOUNTER — Ambulatory Visit: Payer: Medicare Other | Admitting: Pulmonary Disease

## 2012-02-04 NOTE — Telephone Encounter (Signed)
Ok to have Hospice dr write orders and CY be attending physician; ok to place order for hospice and O2 for [facility] to give-O2 is 3.5L/M continuous and portable.

## 2012-02-04 NOTE — Telephone Encounter (Signed)
[name] returned call.  [name]

## 2012-02-04 NOTE — Telephone Encounter (Signed)
LMTCB

## 2012-02-04 NOTE — Telephone Encounter (Signed)
Also need Rx for O2.  Would like to have equipment delivered tomorrow 02/05/12.  [name]

## 2012-02-04 NOTE — Telephone Encounter (Signed)
Called number for Hospice Referrals - closes at 5pm.  Was rec'd by recording to call 24H nurse line at [phone] for new hospice orders/referrals - called this number and was rec'd to call the referral number back tomorrow.  Will do so.

## 2012-02-04 NOTE — Telephone Encounter (Signed)
Spoke with [name]. She states that originally Dr Alwyn Ren was to be the attending for Hopsice, but after recent ov with [name] they had apparently decided [name] would be the attending. She states that is this is true, they need order for Hospice. Also she is requesting that we fax her order for o2 so she can send to [facility]. Please advise, thanks!

## 2012-02-05 NOTE — Telephone Encounter (Signed)
Called above # - was told everyone out of office until 10 am.  wcb

## 2012-02-05 NOTE — Telephone Encounter (Signed)
Called, spoke with [name].  Advised [name] will be the attending physician and ok for hospice MDs to write orders.  Per [name], she already has a referral - does not need an order for this; only needing VO for above.  Also, advised [name] ok with [name] sending order to [name] for o2 3.5 LPM continuous and portable.  However, per [name], since the last conversation with our office, she has spoken with pt's daughter who reports pt does have an o2 concentrator, portable tanks, and liquid o2.  [name] does not want order sent to [name] at this time.  Nothing further needed but she wcb if anything further is needed.

## 2012-02-12 ENCOUNTER — Ambulatory Visit (INDEPENDENT_AMBULATORY_CARE_PROVIDER_SITE_OTHER): Payer: Self-pay | Admitting: Cardiology

## 2012-02-12 ENCOUNTER — Telehealth: Payer: Self-pay | Admitting: Internal Medicine

## 2012-02-12 DIAGNOSIS — I4891 Unspecified atrial fibrillation: Secondary | ICD-10-CM

## 2012-02-12 NOTE — Telephone Encounter (Signed)
LMTCB

## 2012-02-12 NOTE — Telephone Encounter (Signed)
[name] with hospice states the pt and her daughter want clarification on a few things.  1) does pt need to continue on Methylprednisolone 4mg , 2 po daily?  2) continue wearing TED hose?  3) Okay to refill the Mucinex 1200mg  BID?  Pls advise. Allergies  Allergen Reactions  . Cephalexin     Unsure as to reaction.  ? Hives with Cephalexin; "I can take Penicillin"  . Atorvastatin     Forgetfulness   . Codeine Nausea And Vomiting  . Crestor (Rosuvastatin Calcium)     Forgetfulness    . Estradiol     Unknown reaction  . Hydrocortisone Hives    Flea bites were mistaken as rash from hydrocortisone  . Lovastatin Other (See Comments)    forgetfullness,  . Moxifloxacin     Unknown reaction  . Sulfonamide Derivatives     itching

## 2012-02-12 NOTE — Telephone Encounter (Signed)
1) Suggest try reducing medrol to 4 mg once daily ok to send script # 30 w/ 2 refills if needed  2) Continue TEDS if legs keep swelling. When not wearing TEDs, need to elevate legs  3) Ok to continue Mucinex. Could try reducing it to 600 mg twice daily

## 2012-02-12 NOTE — Telephone Encounter (Signed)
[name] aware of [name] recs and says no prescription is needed at this time. Pt has plenty of the Medrol and Mucinex. She did want to let [name] know about the vitamins the pt is taking. They are: Calcium Citrate + D 600-500 daily, CoQ10 100mg  daily, Vitamin C 500mg  daily, MVI and Fish Oil 1200mg . Pt's medication list has been updated. Dr. Maple Hudson, pls advise if these vitamins are okay for the pt to be taking. [name] only wants a return phone call if pt needs to stop any of these.

## 2012-02-13 NOTE — Telephone Encounter (Signed)
I spoke with [name] and she voiced her understanding and had no questions

## 2012-02-13 NOTE — Telephone Encounter (Signed)
Per CY-she may stop them but doesn't have too.

## 2012-02-19 ENCOUNTER — Telehealth: Payer: Self-pay | Admitting: Internal Medicine

## 2012-02-19 NOTE — Telephone Encounter (Signed)
lmomtcb x1 for wendy 

## 2012-02-20 NOTE — Telephone Encounter (Signed)
Spoke with [name], pt's Hospice nurse. She states that since [name] told pt to decrease mucinex from 1200 bid to 600 bid, it is much harder for her to produce sputum and she is trying so hard to cough it up it is causing sputum to be bloody. Is it okay to increase mucinex back to 1200 mg bid?  Pt also requesting recs from [name] on something that she can use to soften ear wax.   [name] also wants to let us know that she is taking an otc antacid pill.  Please advise recs thanks! Allergies  Allergen Reactions  . Cephalexin     Unsure as to reaction.  ? Hives with Cephalexin; "I can take Penicillin"  . Atorvastatin     Forgetfulness   . Codeine Nausea And Vomiting  . Crestor (Rosuvastatin Calcium)     Forgetfulness    . Estradiol     Unknown reaction  . Hydrocortisone Hives    Flea bites were mistaken as rash from hydrocortisone  . Lovastatin Other (See Comments)    forgetfullness,  . Moxifloxacin     Unknown reaction  . Sulfonamide Derivatives     itching

## 2012-02-20 NOTE — Telephone Encounter (Signed)
Per CY-"yes".

## 2012-02-20 NOTE — Telephone Encounter (Signed)
Per CY-all of these are okay.

## 2012-02-20 NOTE — Telephone Encounter (Signed)
Dr Maple Hudson, Cherre Huger please be advised that debrox may be used to soften pt's ear wax and may pt continue the OTC antacid medication that she is taking?  Also, please advise if it's okay that pt is wearing her Percussion Vest per day.  Thanks.

## 2012-02-20 NOTE — Telephone Encounter (Signed)
[name] returned triage's call.  Advised CY stated "yes" for increase in mucinex .  Still looking for recs for softening ear wax, OTC antacid ok?   Also, pt is only able to wear Percussion Vest 10 minutes per day.  Is this okay w/ CY?  [name]

## 2012-02-20 NOTE — Telephone Encounter (Signed)
[name] returned triage's call.  Holly D Pryor ° °

## 2012-02-20 NOTE — Telephone Encounter (Signed)
Called and spoke with hospice nurse, [name].  Informed her of CY's response.  Nothing further needed.

## 2012-02-20 NOTE — Telephone Encounter (Signed)
LMTCB

## 2012-02-26 ENCOUNTER — Ambulatory Visit: Payer: Self-pay | Admitting: Cardiovascular Disease

## 2012-02-26 DIAGNOSIS — I4891 Unspecified atrial fibrillation: Secondary | ICD-10-CM

## 2012-02-27 ENCOUNTER — Other Ambulatory Visit: Payer: Self-pay | Admitting: *Deleted

## 2012-02-27 ENCOUNTER — Telehealth: Payer: Self-pay | Admitting: Internal Medicine

## 2012-02-27 NOTE — Telephone Encounter (Signed)
Opened in Error.

## 2012-02-27 NOTE — Telephone Encounter (Signed)
Pt 's daughter reports that pt is using vest 2 times a day for 20 minutes at a time and this is wearing her out.  She wants to know if pt can break this up and use it 4 times a day for 10 minutes at a time.  Informed her that this would be fine as long as she is getting her time in each day.  She verbalized understanding.

## 2012-03-11 ENCOUNTER — Ambulatory Visit: Payer: Self-pay | Admitting: Cardiology

## 2012-03-11 ENCOUNTER — Telehealth: Payer: Self-pay | Admitting: Internal Medicine

## 2012-03-11 DIAGNOSIS — I4891 Unspecified atrial fibrillation: Secondary | ICD-10-CM

## 2012-03-11 LAB — POCT INR: INR: 2.3

## 2012-03-11 NOTE — Telephone Encounter (Signed)
lmomtcb to inform [place] of below.

## 2012-03-11 NOTE — Telephone Encounter (Signed)
I spoke with Erskine Squibb from hospice and gave recs fot pt. Erskine Squibb called instead of Angelica Chessman because Angelica Chessman is with patients.  Carron Curie, CMA

## 2012-03-11 NOTE — Telephone Encounter (Signed)
Per CY-Give Lasix 40 mg twice today then go back to 20 mg bid; stop Warfarin;resume on Friday call them if still bleeding.

## 2012-03-11 NOTE — Telephone Encounter (Signed)
I spoke with [facility] hospice nurse and she states x Friday pt's condition has worsened. She states pt is coughing up about a tablespoon of blood continuous all day long (could not tell exactly how many times a day), listened to lungs--scattered wheezing, rubbing on left side, upper resp rails on right side. Pt is doing her breathing tx and her meds as directed. Sunday was the pt's worse day yet. Angelica Chessman is requesting recs from [name]. Please advise thanks  Allergies  Allergen Reactions  . Cephalexin     Unsure as to reaction.  ? Hives with Cephalexin; "I can take Penicillin"  . Atorvastatin     Forgetfulness   . Codeine Nausea And Vomiting  . Crestor (Rosuvastatin Calcium)     Forgetfulness    . Estradiol     Unknown reaction  . Hydrocortisone Hives    Flea bites were mistaken as rash from hydrocortisone  . Lovastatin Other (See Comments)    forgetfullness,  . Moxifloxacin     Unknown reaction  . Sulfonamide Derivatives     itching

## 2012-03-12 ENCOUNTER — Encounter: Payer: Self-pay | Admitting: Cardiovascular Disease

## 2012-03-12 ENCOUNTER — Ambulatory Visit (INDEPENDENT_AMBULATORY_CARE_PROVIDER_SITE_OTHER): Payer: Medicare Other | Admitting: Cardiovascular Disease

## 2012-03-12 VITALS — BP 120/54 | HR 64 | Ht 62.5 in | Wt 125.1 lb

## 2012-03-12 DIAGNOSIS — R0602 Shortness of breath: Secondary | ICD-10-CM

## 2012-03-12 DIAGNOSIS — I359 Nonrheumatic aortic valve disorder, unspecified: Secondary | ICD-10-CM

## 2012-03-12 DIAGNOSIS — I350 Nonrheumatic aortic (valve) stenosis: Secondary | ICD-10-CM

## 2012-03-12 DIAGNOSIS — E785 Hyperlipidemia, unspecified: Secondary | ICD-10-CM

## 2012-03-12 LAB — BASIC METABOLIC PANEL
CO2: 33 mEq/L — ABNORMAL HIGH (ref 19–32)
Chloride: 93 mEq/L — ABNORMAL LOW (ref 96–112)
Creatinine, Ser: 0.8 mg/dL (ref 0.4–1.2)
Glucose, Bld: 134 mg/dL — ABNORMAL HIGH (ref 70–99)

## 2012-03-12 LAB — HEPATIC FUNCTION PANEL
Albumin: 3.7 g/dL (ref 3.5–5.2)
Alkaline Phosphatase: 46 U/L (ref 39–117)
Total Bilirubin: 0.6 mg/dL (ref 0.3–1.2)

## 2012-03-12 LAB — LIPID PANEL
LDL Cholesterol: 117 mg/dL — ABNORMAL HIGH (ref 0–99)
Total CHOL/HDL Ratio: 4
Triglycerides: 131 mg/dL (ref 0.0–149.0)

## 2012-03-12 NOTE — Assessment & Plan Note (Signed)
Her aortic valve seems to be fairly stable. We'll consider getting an echocardiogram at her next visit. I'll see her again in 6 months.

## 2012-03-12 NOTE — Progress Notes (Signed)
[name] Date of Birth  16-Nov-1923       [facility]    [place] [address]  [address], [address] [place], [place]  [address]   [place], [place]  [address] [phone]     [phone]   Fax  [phone]    Fax [phone]  Problem List: 1. Severe COPD 2. Status post aortic valve replacement-residual gradient of 30 mmHg 3. History of atrial flutter with chronic anticoagulation 4. Dual-chamber pacemaker 5. Mild coronary artery disease by heart catheterization in 2006  History of Present Illness:  [name] has had problems with COPD / pneumonia x 3.  She has history of aortic stenosis but her main limitation seems to be COPD. The hospice nurse visits her on a regular basis.  Current Outpatient Prescriptions on File Prior to Visit  Medication Sig Dispense Refill  . ALPRAZolam (XANAX) 0.25 MG tablet Take 0.25 mg by mouth at bedtime as needed.       . Alum & Mag Hydroxide-Simeth (MAGIC MOUTHWASH) SOLN Take 5 mLs by mouth 4 (four) times daily as needed. For mouth irritations       . Calcium Citrate-Vitamin D (CALCIUM CITRATE + D PO) Take 1 tablet by mouth daily.      . digoxin (LANOXIN) 0.125 MG tablet Take 125 mcg by mouth daily.      Marland Kitchen diltiazem (CARDIZEM CD) 240 MG 24 hr capsule Take 240 mg by mouth daily.      . diphenhydrAMINE (CHILDRENS ALLERGY) 12.5 MG/5ML liquid Take 12.5 mg by mouth 4 (four) times daily as needed.       [name] Calcium (STOOL SOFTENER PO) Take 100 mg by mouth 2 (two) times daily.       Marland Kitchen esomeprazole (NEXIUM) 40 MG capsule Take 40 mg by mouth daily before breakfast.      . ezetimibe (ZETIA) 10 MG tablet Take 1 tablet (10 mg total) by mouth daily.  30 tablet  6  . furosemide (LASIX) 40 MG tablet Take 20 mg by mouth 2 (two) times daily.        . Guaifenesin (MUCINEX MAXIMUM STRENGTH) 1200 MG TB12 Take 1 tablet (1,200 mg total) by mouth 2 (two) times daily at 10 AM and 5 PM.  14 each  0  . IRON PO Take 1  tablet by mouth daily.        Marland Kitchen levalbuterol (XOPENEX HFA) 45 MCG/ACT inhaler Inhale 2 puffs into the lungs 2 (two) times daily.       Marland Kitchen levalbuterol (XOPENEX) 1.25 MG/3ML nebulizer solution Take 1.25 mg by nebulization 3 (three) times daily. DX:  493.20  72 mL  0  . Mometasone Furo-Formoterol Fum (DULERA) 200-5 MCG/ACT AERO Inhale 2 puffs into the lungs 2 (two) times daily.      . montelukast (SINGULAIR) 10 MG tablet Take 10 mg by mouth at bedtime.      . Multiple Vitamins-Minerals ([facility] SPECTRAVITE SENIOR) TABS Take 1 tablet by mouth daily.      . Omega-3 Fatty Acids (FISH OIL) 1200 MG CAPS Take 1 capsule by mouth daily.      . potassium chloride SA (K-DUR,KLOR-CON) 20 MEQ tablet Take 20 mEq by mouth 2 (two) times daily.       . predniSONE (DELTASONE) 10 MG tablet Take 10 mg by mouth daily.      . sertraline (ZOLOFT) 50 MG tablet Take 50 mg by mouth daily.      Marland Kitchen  sotalol (BETAPACE) 80 MG tablet Take 80 mg by mouth 2 (two) times daily.      Marland Kitchen tiotropium (SPIRIVA) 18 MCG inhalation capsule Place 18 mcg into inhaler and inhale daily.      . vitamin C (ASCORBIC ACID) 500 MG tablet Take 500 mg by mouth daily.      Marland Kitchen warfarin (COUMADIN) 1 MG tablet Take 0.5-1 mg by mouth daily. Take 0.5 tablet on Tuesday & Thursday Take 1 tablet on all other days        Allergies  Allergen Reactions  . Cephalexin     Unsure as to reaction.  ? Hives with Cephalexin; "I can take Penicillin"  . Atorvastatin     Forgetfulness   . Codeine Nausea And Vomiting  . Crestor (Rosuvastatin Calcium)     Forgetfulness    . Estradiol     Unknown reaction  . Hydrocortisone Hives    Flea bites were mistaken as rash from hydrocortisone  . Lovastatin Other (See Comments)    forgetfullness,  . Moxifloxacin     Unknown reaction  . Sulfonamide Derivatives     itching    Past Medical History  Diagnosis Date  . Asthmatic bronchitis   . Allergic rhinitis   . Atrial fibrillation   . History of aortic valve replacement  Dr. Tyrone Sage 2006    Has severe residual gradient and will be managed conservatively  . Sleep apnea   . Chronic obstructive asthma   . Osteoporosis   . Pruritic disorder   . Diverticulosis   . Hemorrhoids   . GERD (gastroesophageal reflux disease)   . IBS (irritable bowel syndrome)   . Pacemaker 2010  . Oxygen dependent   . Atrial flutter     on Betapace  . High risk medication use     on Betapace & Coumadin  . Chronic anticoagulation   . COPD (chronic obstructive pulmonary disease)   . Pneumonia   . Anemia   . Arthritis   . Hypertension   . Coronary artery disease   . Pneumonia 09/2011    in [facility] for Rehab post discharge until 12/18    Past Surgical History  Procedure Date  . Back surgery 1994  . Tonsillectomy   . Appendectomy   . Ganglion cyst excision   . Vocal cord polyps 1975 and 1984  . Aortic valve replacement 2006    Bioprosthetic  . Pacemaker insertion 2010  . Cardiac electrophysiology mapping and ablation   . Coronary artery bypass graft   . Insert / replace / remove pacemaker   . Eye surgery   . Cardiac catheterization   . Coronary angioplasty   . Cardiac valve replacement     History  Smoking status  . Former Smoker -- 1.0 packs/day for 50 years  . Types: Cigarettes  . Quit date: 11/05/1993  Smokeless tobacco  . Never Used    History  Alcohol Use No    Family History  Problem Relation Age of Onset  . Kidney failure Father   . Colon cancer Paternal Aunt   . Heart disease Maternal Grandfather   . Heart attack Paternal Grandfather   . Breast cancer Daughter   . Stroke Maternal Grandmother   . Heart disease Mother     Reviw of Systems:  Reviewed in the HPI.  All other systems are negative.  Physical Exam: Blood pressure 120/54, pulse 64, height 5' 2.5" (1.588 m), weight 125 lb 1.9 oz (56.754 kg). General: Well developed, well  nourished, in no acute distress.  Head: Normocephalic, atraumatic, sclera non-icteric, mucus membranes are  moist,   Neck: Supple. Carotids are 2 + without bruits. No JVD  Lungs: she has a few bilateral wheezes  Heart: regular rate.  normal  S1 S2. There is a soft systolic murmur.  Abdomen: Soft, non-tender, non-distended with normal bowel sounds. No hepatomegaly. No rebound/guarding. No masses.  Msk:  Strength and tone are normal  Extremities: No clubbing or cyanosis. No edema.  Distal pedal pulses are 2+ and equal bilaterally.  Neuro: Alert and oriented X 3. Moves all extremities spontaneously.  Psych:  Responds to questions appropriately with a normal affect.  ECG:   Assessment / Plan: ulses are 2+ and equal bilaterally.

## 2012-03-12 NOTE — Patient Instructions (Signed)
Your physician wants you to follow-up in: 6 MONTHS You will receive a reminder letter in the mail two months in advance. If you don't receive a letter, please call our office to schedule the follow-up appointment.  Your physician recommends that you return for lab work in: TODAY, BMET LIPID HEPATIC CBC  TRY TO KEEP APP WITH DR Graciela Husbands FOR PACER CHECK

## 2012-03-12 NOTE — Assessment & Plan Note (Signed)
Mrs. Vullo continues to have episodes of shortness breath but I suspect that most of this is due to COPD. She also has aortic stenosis.  Her last mean aortic valve gradient was 35 mmHg.  I've asked her to with eating some salt. She eats soup several times a week. I told her that if she eats less salt we probably would be able to decrease her Lasix and potassium dose. She did tell me about one episode of lightheadedness that was probably due to hypotension.

## 2012-03-13 ENCOUNTER — Telehealth: Payer: Self-pay | Admitting: Internal Medicine

## 2012-03-13 LAB — CBC WITH DIFFERENTIAL/PLATELET
Eosinophils Relative: 0.5 % (ref 0.0–5.0)
HCT: 39.5 % (ref 36.0–46.0)
Hemoglobin: 13 g/dL (ref 12.0–15.0)
Lymphs Abs: 0.9 10*3/uL (ref 0.7–4.0)
MCV: 83.2 fl (ref 78.0–100.0)
Monocytes Relative: 7.1 % (ref 3.0–12.0)
Neutro Abs: 11.5 10*3/uL — ABNORMAL HIGH (ref 1.4–7.7)
RDW: 15.7 % — ABNORMAL HIGH (ref 11.5–14.6)
WBC: 13.4 10*3/uL — ABNORMAL HIGH (ref 4.5–10.5)

## 2012-03-13 NOTE — Telephone Encounter (Signed)
I spoke with pt and is aware of [name] recs. She voiced her understanding and had no questions 

## 2012-03-13 NOTE — Telephone Encounter (Signed)
Per CY---can she use the neti pot or squeeze bottle to rinse her nose so we can avoid more abx?  thanks

## 2012-03-13 NOTE — Telephone Encounter (Signed)
Called and spoke with pt. She states that for the past wk has had terrible time with allergies- only c/o nasal symptoms. She states that her nose has been bleeding occ and she has constant rhinitis with clear to green mucus. She states has appt with [name] coming up on 03-24-12 and wants to know if [name] would like to get her started back on allergy vaccine at that time. Please advise, thanks!

## 2012-03-14 ENCOUNTER — Telehealth: Payer: Self-pay | Admitting: Internal Medicine

## 2012-03-14 ENCOUNTER — Encounter: Payer: Self-pay | Admitting: Internal Medicine

## 2012-03-14 MED ORDER — PREDNISONE 10 MG PO TABS: ORAL_TABLET | ORAL | Status: DC

## 2012-03-14 NOTE — Telephone Encounter (Signed)
LMTCB x 1 

## 2012-03-14 NOTE — Telephone Encounter (Signed)
Per CY-okay to give patient Prednisone 10 mg #20 take 4 x 2 days, 3 x 2 days, 2 x 2 days, 1 x 2 days, then stop no refills.  

## 2012-03-14 NOTE — Telephone Encounter (Signed)
Pt having increased trouble with breathing due to increase in thick mucus.  Pt had episode of coughing to clear mucus last night and sats dropped to 70's.  Hospice nurse reports that pt does not have signs of infection.  Pt is requesting a Pred taper .  Please advise.   ([facility] Allergies  Allergen Reactions  . Cephalexin     Unsure as to reaction.  ? Hives with Cephalexin; "I can take Penicillin"  . Atorvastatin     Forgetfulness   . Codeine Nausea And Vomiting  . Crestor (Rosuvastatin Calcium)     Forgetfulness    . Estradiol     Unknown reaction  . Hydrocortisone Hives    Flea bites were mistaken as rash from hydrocortisone  . Lovastatin Other (See Comments)    forgetfullness,  . Moxifloxacin     Unknown reaction  . Sulfonamide Derivatives     itching

## 2012-03-14 NOTE — Telephone Encounter (Signed)
LMTCB

## 2012-03-14 NOTE — Telephone Encounter (Signed)
There are 2 messages on patient: Please have patient restart at previous maintenance dose.

## 2012-03-14 NOTE — Telephone Encounter (Signed)
Pt has not had any hemoptysis in several days per hospice nurse.  She wants to know if Dr Maple Hudson would like to restart Coumadin and what dose.  Please advise.

## 2012-03-14 NOTE — Telephone Encounter (Signed)
Called, spoke with French Ana, Hospice nurse.  Informed her of below per Dr. Maple Hudson.  She verbalized understanding of this, aware rx sent to [facility] [address], and will inform pt.  Also, French Ana states the hemoptysis has resolved.  She would like to know if pt can resume coumadin.  Dr. Maple Hudson, pls advise.  Thank you.

## 2012-03-14 NOTE — Telephone Encounter (Signed)
traci with hospice called back again. Per leigh I advised nurse that a nurse from triage will call back after getting orders / recs from Dr. Maple Hudson.

## 2012-03-14 NOTE — Telephone Encounter (Signed)
Called and spoke with tracey from hospice and she is aware that per CY   Ok to restart on the previous maintenance dose.  tracey voiced her understanding and stated that they are to recheck the pt on Monday.

## 2012-03-14 NOTE — Telephone Encounter (Signed)
PER CY-Restart at previous maintenance dose.

## 2012-03-17 ENCOUNTER — Telehealth: Payer: Self-pay | Admitting: Internal Medicine

## 2012-03-17 NOTE — Telephone Encounter (Signed)
CY, are you okay with waiting until Tuesday to have patient get PT/INR checked? Thanks.

## 2012-03-17 NOTE — Telephone Encounter (Signed)
Per CY-okay to wait until Tuesday for PT/INR. Traci with hospice is aware.

## 2012-03-17 NOTE — Telephone Encounter (Signed)
Per previous phone note: Reynaldo Minium, CMA 03/14/2012 2:30 PM Signed  PER CY-Restart at previous maintenance dose.   French Ana was advised. Carron Curie, CMA

## 2012-03-18 ENCOUNTER — Telehealth: Payer: Self-pay | Admitting: Internal Medicine

## 2012-03-18 NOTE — Telephone Encounter (Signed)
Per CY-please make sure patient is not bleeding anymore; if not then have patient take 1mg  QD and check accordingly(as scheduled); if bleeding then do not increase dose.

## 2012-03-18 NOTE — Telephone Encounter (Signed)
[name] returned call. Says that pt currently takes 1mg  daily except on tuesdays when pt takes .5mg . Please LMOM re: dosage for pt so that nurses don't have to continue missing each other. Thanks.

## 2012-03-18 NOTE — Telephone Encounter (Signed)
LMTCB-we need to confirm patients current dose of coumadin.

## 2012-03-18 NOTE — Telephone Encounter (Signed)
Spoke with Traci at hospice-pt is not having any bleeding now and will increased her coumadin to 1mg  daily.

## 2012-03-18 NOTE — Telephone Encounter (Signed)
Per tracey pt's PT 25.4 and INR 2.1. Please advise Dr. Maple Hudson, thanks

## 2012-03-20 ENCOUNTER — Ambulatory Visit (INDEPENDENT_AMBULATORY_CARE_PROVIDER_SITE_OTHER): Payer: Medicare Other | Admitting: *Deleted

## 2012-03-20 ENCOUNTER — Encounter: Payer: Self-pay | Admitting: Internal Medicine

## 2012-03-20 ENCOUNTER — Telehealth: Payer: Self-pay | Admitting: Internal Medicine

## 2012-03-20 ENCOUNTER — Encounter: Payer: Medicare Other | Admitting: Internal Medicine

## 2012-03-20 DIAGNOSIS — I4891 Unspecified atrial fibrillation: Secondary | ICD-10-CM

## 2012-03-20 NOTE — Telephone Encounter (Signed)
[name] called to let us know that pt will finish her pred taper tomorrow and will resume all meds as before; also states that patient is telling Hospice that she MUST have brand name only for her Xopenex. Hospice would like to know if CY agrees with this or if there is something else that the patient can use. [name] is aware that CY is out of the office until Friday morning but will advise then.

## 2012-03-21 NOTE — Telephone Encounter (Signed)
Spoke with wendy at hospice per CY ok for pt to use xopenex 4 times a day .

## 2012-03-21 NOTE — Telephone Encounter (Signed)
Spoke with wendy at hospice advise her that Per CY PT could have name brand xopenex howeer  She has to be willing to do self pay for meds. Prior [employer] cover this.[name] would let Pt know.   <>> ALSO pt stated that she is to be on xopenex 4 times a day. Dr Maple Hudson is she suppose to being doing this 3 times a day or 4 times a day Call wendy at [phone]. Dr Maple Hudson Please advise  Thank you

## 2012-03-24 ENCOUNTER — Ambulatory Visit (INDEPENDENT_AMBULATORY_CARE_PROVIDER_SITE_OTHER): Payer: Medicare Other | Admitting: Internal Medicine

## 2012-03-24 ENCOUNTER — Encounter: Payer: Self-pay | Admitting: Internal Medicine

## 2012-03-24 ENCOUNTER — Telehealth: Payer: Self-pay | Admitting: Internal Medicine

## 2012-03-24 VITALS — BP 146/72 | HR 66 | Ht 62.5 in | Wt 127.8 lb

## 2012-03-24 DIAGNOSIS — J449 Chronic obstructive pulmonary disease, unspecified: Secondary | ICD-10-CM

## 2012-03-24 DIAGNOSIS — I359 Nonrheumatic aortic valve disorder, unspecified: Secondary | ICD-10-CM

## 2012-03-24 DIAGNOSIS — J301 Allergic rhinitis due to pollen: Secondary | ICD-10-CM

## 2012-03-24 DIAGNOSIS — I350 Nonrheumatic aortic (valve) stenosis: Secondary | ICD-10-CM

## 2012-03-24 LAB — REMOTE PACEMAKER DEVICE
BATTERY VOLTAGE: 2.99 V
VENTRICULAR PACING PM: 12

## 2012-03-24 MED ORDER — METHYLPREDNISOLONE 8 MG PO TABS: 8.0000 mg | ORAL_TABLET | Freq: Every day | ORAL | Status: AC

## 2012-03-24 NOTE — Patient Instructions (Signed)
We will have the allergy lab work with your Hospice nurse to try to get allergy vaccine restarted  Script to change your maintenance steroid to methylprednisolone/ Medrol  8 mg daily  You have the lab copies

## 2012-03-24 NOTE — Telephone Encounter (Signed)
Called spoke with patient's daughter who is requesting clarification on the recs today regarding diet restrictions for pt.  [name] is aware that this was discussed today at ov, but states that family is requesting clarification/confirmation.  ie can pt eat at her favorite restaurants and meals that "she's enjoyed her whole life."  [name] is aware [name] is gone for the evening and is happy with a call back in the morning.    Dr Maple Hudson please advise, thanks.

## 2012-03-24 NOTE — Progress Notes (Signed)
Patient ID: STEPHANIE LITTMAN, female    DOB: 12-07-1923, 78 y.o.   MRN: [id number]  HPI  07/23/2011-86 yoF former smoker followed for COPD, chronic hypoxic respiratory failure, complicated by atrial fibrillation, valvular heart disease and intermittent hemoptysis. She blames rain and pollen this week for nasal congestion. Minor epistaxis despite saline spray. Short of breath with her reading but that's not really different. Coughing a little scant yellow sputum, not bad. This happened about the time she changed to generic Singulair so she's not sure if it's a medication effect but does not feel as if she has cold. Continues oxygen at 3-3.5 L.  09/25/11- 86 yoF former smoker followed for COPD, chronic hypoxic respiratory failure, complicated by atrial fibrillation, valvular heart disease and intermittent hemoptysis Post hospital- 11/10-11/16/12- she was discharged with diagnoses COPD with exacerbation, acute on chronic respiratory failure on chronic oxygen, presumed community-acquired pneumonia, acute diastolic congestive heart failure, atrial fibrillation, history of aortic valve replacement, hypertension, history of coronary artery disease. She had had some blood in her sputum again. Chest x-ray had shown previous CABG and a right chest wall pacemaker, normal heart size, peripheral opacity within the right mid lung which was new with left pleural effusion and left lower lobe airspace consolidation. The concern was for multifocal pneumonia. She still feels especially unsteady if she tries to walk with her walker. There still is some blood streak in her sputum after treatment with broad-spectrum antibiotics in hospital. She expresses strong preference for brand-name Xopenex nebulizer solution rather than generic which she does not consider is effective.   10/25/11- 63 yoF former smoker followed for COPD, chronic hypoxic respiratory failure, complicated by atrial fibrillation, valvular heart disease and  intermittent hemoptysis After last hosp, she spent 4 weeks in rehab SNF. Feels much stronger. Some edema of feet. Last CT had indicated chronic pneumonia/ infiltrates- reviewed with her.  CT chest 09/25/11- IMPRESSION:  1. There are bilateral areas of peripheral and subpleural  consolidation within the right upper lobe, right middle lobe and  posterior medial left lung base. The appearance is nonspecific  favoring inflammatory or infectious etiologies. Underlying  malignancy cannot be excluded and interval follow-up is can be  necessary to ensure complete resolution. Recommend follow-up  imaging and 3-6 weeks after appropriate antibiotic therapy. The  study of choice would be a noncontrast CT of the chest.  2. Advanced emphysema.  Original Report Authenticated By: Rosealee Albee, M.D.   12/06/11- 36 yoF former smoker followed for COPD, chronic hypoxic respiratory failure, complicated by atrial fibrillation, valvular heart disease and intermittent hemoptysis   Daughter here She has not felt particularly in the last several days. We had sent Augmentin January 29 and she has taken about 4 doses. She is blowing her nose a lot, mostly clear mucus. Cough productive of yellow sputum now turning darker. Thinks she might have had some fever this morning with no chills. Has felt weak, somewhat nauseated. Describes a sharp pain intermittently in the left lower anterolateral chest and left upper quadrant of the abdomen, related to deep breath and movement but not to meals.  12/31/11  43 yoF former smoker followed for COPD, chronic hypoxic respiratory failure, complicated by atrial fibrillation/ coumadin, valvular heart disease and intermittent hemoptysis   Daughters here Post hosp visit 12/10/11- 12/17/11- for AECOPD, Rx'd Vanc/ Zosyn and discharged on Augmentin ending 12/19/11. Denies fever now. Cough productive sticky green to yellow sputum with no blood. Denies chest pain. Doing physical therapy At [facility]  Burn. Using VEST 3x/day, Flutter device. These do help clear secretions. Continues Xopenex nebulizer An inhaler with Dulera 200 and Spiriva. She would like to continue allergy shots but is in a nursing facility now for rehabilitation after her pneumonia.  03/24/12- 86 yoF former smoker followed for COPD, chronic hypoxic respiratory failure, complicated by atrial fibrillation/ coumadin, valvular heart disease and intermittent hemoptysis   Daughters here  Pt states increased sob,wheezing, productive cough with increase activity Now working with home hospice. Alprazolam helps her sleep. Prednisone 10 mg daily maintenance helps stabilize her breathing. Medrol 4 mg was not enough. We compared methylprednisolone with prednisone and discussed long-term side effects.  She believes allergy vaccine has made a difference for her even though she is not outside very much and is now quite elderly. I am less impressed but she believes in it strongly and wants to restart. We discussed whether the hospice nurse could give her shots at home. She has brought this issue up several times.  ROS-see HPI Constitutional:   No-   weight loss, night sweats,  ?fevers, chills,  +fatigue, lassitude. HEENT:   No-  headaches, difficulty swallowing, tooth/dental problems, sore throat, +episodic epistaxis.      No-  sneezing, itching, ear ache,   +nasal congestion, post nasal drip,  CV:  + No acute chest pain,  No-orthopnea, PND, swelling in lower extremities, anasarca, dizziness, palpitations Resp: + shortness of breath with exertion or at rest.              +  productive cough,  No non-productive cough,  No- coughing up of blood.              + change in color of mucus.  No- wheezing.   Skin: No-   rash or lesions. GI:  No-   heartburn, indigestion, abdominal pain, nausea, vomiting,  GU:  MS:  No- acute  joint pain or swelling.   Neuro-     nothing unusual Psych:  No- change in mood or affect. + depression or anxiety.  No  memory loss.  Objective:   Physical ExamSATURATION QUALIFICATIONS: Patient Saturations on Room Air at Rest = 83% Patient Saturations on Room Air while Ambulating = 82% Patient Saturations on 3 Liters of oxygen while Ambulating = 92%---Katie Welchel,CMA General- Alert, Oriented, Affect-appropriate, Distress- none acute   O2 3 L/M, wheelchair. Talkative. Very sweet/very frail. Skin- rash-none, lesions- none, excoriation- none Lymphadenopathy- none Head- atraumatic            Eyes- Gross vision intact, PERRLA, conjunctivae clear secretions            Ears- Hearing, canals-normal for age            Nose- Clear, no-Septal dev, mucus, polyps, erosion, perforation. No anterior clot seen today.             Throat- Mallampati II-III , mucosa clear , drainage- none, tonsils- atrophic Neck- flexible , trachea midline, no stridor , thyroid nl, carotid no bruit Chest - symmetrical excursion , unlabored           Heart/CV- RRR , 2-3/6 systolic LSB murmur , no gallop  , no rub, nl s1 s2                           - JVD- 1cm , edema- trace, stasis changes- none, varices- none           Lung- bilateral wheeze, harsh cough ,  dullness-none, rub- none,  Bilateral mild rhonchi           Chest wall- not tender left lower ribs. Pacemaker. Abd- tender-no, distended-no, bowel sounds-present, HSM- no Br/ Gen/ Rectal- Not done, not indicated Extrem- cyanosis- none, clubbing, none, atrophy- none, strength- nl for her Neuro- grossly intact to observation

## 2012-03-25 ENCOUNTER — Other Ambulatory Visit: Payer: Self-pay | Admitting: Cardiovascular Disease

## 2012-03-25 ENCOUNTER — Telehealth: Payer: Self-pay | Admitting: Internal Medicine

## 2012-03-25 MED ORDER — POTASSIUM CHLORIDE CRYS ER 20 MEQ PO TBCR: 20.0000 meq | EXTENDED_RELEASE_TABLET | Freq: Two times a day (BID) | ORAL | Status: AC

## 2012-03-25 MED ORDER — WARFARIN SODIUM 1 MG PO TABS: 1.0000 mg | ORAL_TABLET | ORAL | Status: DC

## 2012-03-25 NOTE — Telephone Encounter (Signed)
lmomtcb  

## 2012-03-25 NOTE — Telephone Encounter (Signed)
She can eat what ever she likes. Need to follow guidance on warfarin, and not too heavy on the salt to avoid fluid retention.

## 2012-03-25 NOTE — Telephone Encounter (Signed)
I spoke with wendy and she states pt's INR was 2.4. Also she states pt is wanting to know if they can give pt her allergy shots. [name] also wanted to make [name] awre that they added tylenol extra strength 1 po q6hrs prn for her headaches. I advised will make cdy aware of that. Please advise regarding INR results and allergy shots Dr. Maple Hudson, thanks

## 2012-03-25 NOTE — Telephone Encounter (Signed)
[name]- Patient feels strongly that she wants to be back on her allergy shots. Ok to restart at 0.1 of 1:50 and maybe hold there, once weekly. If you call the home and make arrangement to discuss with the Hospice nurse, that nurse could give the shots at home.

## 2012-03-25 NOTE — Telephone Encounter (Signed)
I spoke with [name] and is aware of [name] recs. She voiced her understanding and had no questions

## 2012-03-26 ENCOUNTER — Ambulatory Visit: Payer: Self-pay | Admitting: Cardiology

## 2012-03-26 DIAGNOSIS — Z7901 Long term (current) use of anticoagulants: Secondary | ICD-10-CM

## 2012-03-26 DIAGNOSIS — I4891 Unspecified atrial fibrillation: Secondary | ICD-10-CM

## 2012-03-26 NOTE — Telephone Encounter (Signed)
I called [name],[name] wasn't there so she gave me the number for hospice. I called and had to leave [name]  a message on her voice mail to call me back.

## 2012-03-26 NOTE — Telephone Encounter (Signed)
Pt's coumadin has been dosed by the coumadin previously.  [name] not in the office this afternoon.  Would call the coumadin clinic with INR results for dosing.  Left detailed message on [facility] voicemail apologizing for the tardiness in getting with her.  Recommended she call the coumadin clinic with pt's INR results for dosing.  Encouraged her to call if anything further is needed.  Will sign off and forward to [name] as FYI.

## 2012-03-26 NOTE — Telephone Encounter (Addendum)
[name] of Hospice called back & can be reached at [phone].  Stated that she gave pt's INR yesterday, but was unsure what to do regarding Coumadin.  Also, would like to know if she should continue calling INR results to CY, or if this should be returned to calling this in w/ the Coumadin Clinic.  [name]

## 2012-03-27 ENCOUNTER — Ambulatory Visit (INDEPENDENT_AMBULATORY_CARE_PROVIDER_SITE_OTHER): Payer: Medicare Other

## 2012-03-27 DIAGNOSIS — J309 Allergic rhinitis, unspecified: Secondary | ICD-10-CM

## 2012-03-29 NOTE — Assessment & Plan Note (Signed)
Intensity of her murmur sounds about the same.

## 2012-03-29 NOTE — Assessment & Plan Note (Signed)
She is focused on this form of therapy and strongly believes it helps her. If the hospice nurse is able to give her allergy shots at home at a fixed dose then we agreed to restart.

## 2012-03-29 NOTE — Assessment & Plan Note (Signed)
She remains on continuous oxygen at 3-1/2 L. She is probably steroid dependent partly because of adrenal insufficiency now but she is best served by maintaining maintenance therapy. She prefers methylprednisolone and we will use 8 mg daily.

## 2012-04-01 ENCOUNTER — Telehealth: Payer: Self-pay | Admitting: Internal Medicine

## 2012-04-01 MED ORDER — LEVOFLOXACIN 500 MG PO TABS: 500.0000 mg | ORAL_TABLET | Freq: Every day | ORAL | Status: AC

## 2012-04-01 NOTE — Telephone Encounter (Signed)
Per CY-if they can figure out how, they can adjust for comfort.

## 2012-04-01 NOTE — Telephone Encounter (Signed)
I spoke with [name] and advised her of recs and rx for Levaquin sent to pharmacy. Pt has one more question. They want to know can they decrease the setting on the pt percussion vest? Please advise. Carron Curie, CMA

## 2012-04-01 NOTE — Telephone Encounter (Signed)
Called, spoke with [name], hospice nurse.  She is at pt's house now visiting.  States pt's had a "rough 4-5 days."  Reports pt having difficulty talking and breathing at the same time.  Temp 99.9 yesterday.  Has prod cough with green mucus, expiratory wheezes, rhonchi in left lobe, and audible crackles but no crackles in lungs when listening.  [name] has a few questions for Dr. Maple Hudson: 1.  Pt is supposed to start allergy shots -- Would Dr. Maple Hudson like to hold off on this for now with above symptoms? 2.  Requesting recs for above symptoms. 3.  On Saturday, pt's o2 sat in the 70's while on 3.5L with exertion.  Would Dr. Maple Hudson like to increase o2 to 4L continuous? 4.  Senokot S started today.  This is FYI only as Dr. Maple Hudson signed standing orders for this.    Dr. Maple Hudson, pls advise.  Thank you.  Allergies  Allergen Reactions  . Cephalexin     Unsure as to reaction.  ? Hives with Cephalexin; "I can take Penicillin"  . Atorvastatin     Forgetfulness   . Codeine Nausea And Vomiting  . Crestor (Rosuvastatin Calcium)     Forgetfulness    . Estradiol     Unknown reaction  . Hydrocortisone Hives    Flea bites were mistaken as rash from hydrocortisone  . Lovastatin Other (See Comments)    forgetfullness,  . Moxifloxacin     Unknown reaction  . Sulfonamide Derivatives     itching

## 2012-04-01 NOTE — Telephone Encounter (Signed)
[name] is aware. Carron Curie, CMA

## 2012-04-01 NOTE — Telephone Encounter (Signed)
Per CY-okay HOLD off allergy shots, check next week, Okay to increase O2 to 4L/M and have patient take Levaquin 500 mg #7 take 1 po qd no refills.

## 2012-04-07 ENCOUNTER — Ambulatory Visit: Payer: Self-pay | Admitting: Cardiology

## 2012-04-07 ENCOUNTER — Encounter: Payer: Self-pay | Admitting: *Deleted

## 2012-04-07 ENCOUNTER — Telehealth: Payer: Self-pay | Admitting: Internal Medicine

## 2012-04-07 DIAGNOSIS — I4891 Unspecified atrial fibrillation: Secondary | ICD-10-CM

## 2012-04-07 LAB — POCT INR: INR: 3.8

## 2012-04-07 NOTE — Telephone Encounter (Signed)
LMTCB

## 2012-04-07 NOTE — Telephone Encounter (Signed)
CY--hospice calling with INR results of 3.8.  Please advise.   Also requesting ok to refill the xopenex(brand name only) and singulair.  Please advise. thanks

## 2012-04-07 NOTE — Telephone Encounter (Signed)
Hold coumadin x 3 days, then recheck INR. Finish levaquin on schedule.

## 2012-04-08 NOTE — Telephone Encounter (Signed)
Per CY-since her coumadin is on hold for 3 days, just wait 2 days then restart VEST.

## 2012-04-08 NOTE — Telephone Encounter (Signed)
Spoke with [name] and notified of recs per [name] and also advised ok for refills on xopenex HFA and singulair (she states will take care of this).    [name] states that she pt has hemmorage in her left eye and she thinks that the pt should hold of on using percussion vest until this has resolved, but the family worries that this will worsen respiratory status. Please advise thanks!

## 2012-04-08 NOTE — Telephone Encounter (Signed)
[name] from hospice returned call. [name]

## 2012-04-08 NOTE — Telephone Encounter (Signed)
lmomtcb x1 for wendy 

## 2012-04-08 NOTE — Telephone Encounter (Signed)
lmomtcb x2 for wendy 

## 2012-04-09 NOTE — Telephone Encounter (Signed)
Per CY-ok to start VEST now. [name] at hospice is aware.

## 2012-04-09 NOTE — Telephone Encounter (Signed)
Spoke with Memory Argue stated that patient's coumadin was only held for 1 day not 3 days; pt had PT/INR checked on Monday and has current dose of 1mg  1 tablet on Tuesdays and Friday; 1/2 tablet all other days. Order was given by coumadin clinic to have PT/INR rechecked on Monday 6-10. CY-please advise if you are okay with patient going ahead and use the VEST now; pt restarted her coumadin on Tuesday 04-08-12. Thanks.

## 2012-04-11 ENCOUNTER — Telehealth: Payer: Self-pay | Admitting: Internal Medicine

## 2012-04-11 MED ORDER — EPINEPHRINE 0.15 MG/0.3ML IJ DEVI: 0.1500 mg | INTRAMUSCULAR | Status: AC | PRN

## 2012-04-11 NOTE — Telephone Encounter (Signed)
Called and spoke with [name] from hospice and she is aware that per CY---pt will need to keep an epipen on hand since she is doing her allergy vaccines at home.  [name] is aware that this has been sent in to the pts pharmacy.

## 2012-04-11 NOTE — Telephone Encounter (Signed)
Policy now is for anybody getting allergy vaccine outside a medical office to have epipen.  Please send Rx Epipen Jr     #1, ref prn   For severe allergic reaction.

## 2012-04-11 NOTE — Telephone Encounter (Signed)
Alan Ripper from [facility] states she was speaking with someone from this office regarding a home health cert from 16/10/96. Alan Ripper states we were supposed to be faxing it over to her, but she did not receive it. She does not know with whom she was speaking. fx # [phone]

## 2012-04-11 NOTE — Telephone Encounter (Signed)
CY please advise if there needs to be an epi-pen sent in for the pt to keep at home.  Pt stated that she has never had an epi-pen and she did get her allergy shot today.  Please advise.  thanks

## 2012-04-14 ENCOUNTER — Ambulatory Visit: Payer: Self-pay | Admitting: Cardiology

## 2012-04-14 DIAGNOSIS — I4891 Unspecified atrial fibrillation: Secondary | ICD-10-CM

## 2012-04-21 ENCOUNTER — Ambulatory Visit (INDEPENDENT_AMBULATORY_CARE_PROVIDER_SITE_OTHER): Payer: Medicare Other | Admitting: Cardiology

## 2012-04-21 ENCOUNTER — Telehealth: Payer: Self-pay | Admitting: Internal Medicine

## 2012-04-21 DIAGNOSIS — I4891 Unspecified atrial fibrillation: Secondary | ICD-10-CM

## 2012-04-21 NOTE — Telephone Encounter (Signed)
Per [name]- rec that we just watch if symptomatically unchanged.  LMTCB for [employer]

## 2012-04-21 NOTE — Telephone Encounter (Signed)
Spoke with [name]. She states calling to let [name] know that on 6-11 pt weighed 121.8 lbs and today she weighed 127 lbs. She only has trace of edema in her feet and ankles. She states that she does not appear to be in any distress. Lungs sounds diminished. She is currently taking lasix 20 mg bid. She states that her BP has been low- around 102/52, so she is afraid to have lasix increased. [name], please advise, thanks!

## 2012-04-21 NOTE — Telephone Encounter (Signed)
lmomtcb x1 for wendy 

## 2012-04-21 NOTE — Telephone Encounter (Signed)
[name] returned call.  [name]

## 2012-04-22 NOTE — Telephone Encounter (Signed)
[name] returned call.  Advised of [name]'s recs.  [name] verbalized her understanding.  Will sign off.

## 2012-04-24 NOTE — Telephone Encounter (Signed)
I have reviewed this patient's chart and is unable to locate correspondence from 10/24/11. I called Clarie and left message requesting that she re-fax information that she is referring to from December 19,2012 to our main fax (number was left). Clarie instructed to call if questions or concerns.

## 2012-04-25 ENCOUNTER — Telehealth: Payer: Self-pay | Admitting: Internal Medicine

## 2012-04-25 NOTE — Telephone Encounter (Signed)
New problem;  Patient daughter [name], mother under hospice  care wanted to know can they be set up for remote transmit versus coming into the office.

## 2012-04-25 NOTE — Telephone Encounter (Signed)
Spoke with [name]. She states that the pt's family is wanting to know how much sodium the pt should be allowed on a daily basis. [name] advised to not add salt to food and things like that, but the pt's family is requesting a concrete answer to this question. [name] aware that [name] is out of the office until next wk and is fine with a call back then. Please advise, thanks!

## 2012-04-25 NOTE — Telephone Encounter (Signed)
Pt scheduled for remote check on 06-26-12. Daughter aware of this.

## 2012-04-28 ENCOUNTER — Other Ambulatory Visit: Payer: Self-pay | Admitting: *Deleted

## 2012-04-28 MED ORDER — FUROSEMIDE 40 MG PO TABS: 20.0000 mg | ORAL_TABLET | Freq: Two times a day (BID) | ORAL | Status: DC

## 2012-04-28 NOTE — Telephone Encounter (Signed)
Ok to try 2 gram sodium, with occasional "comfort food"

## 2012-04-28 NOTE — Telephone Encounter (Signed)
Refilled furosemide

## 2012-04-28 NOTE — Telephone Encounter (Signed)
I spoke with wendy and is aware of [name] recs. She voiced her understanding and needed nothing further

## 2012-05-02 ENCOUNTER — Ambulatory Visit: Payer: Self-pay | Admitting: Cardiology

## 2012-05-02 DIAGNOSIS — I4891 Unspecified atrial fibrillation: Secondary | ICD-10-CM

## 2012-05-02 LAB — POCT INR: INR: 2.5

## 2012-05-04 ENCOUNTER — Encounter (HOSPITAL_COMMUNITY): Payer: Self-pay | Admitting: Emergency Medicine

## 2012-05-04 ENCOUNTER — Emergency Department (HOSPITAL_COMMUNITY)

## 2012-05-04 ENCOUNTER — Inpatient Hospital Stay (HOSPITAL_COMMUNITY)
Admission: EM | Admit: 2012-05-04 | Discharge: 2012-05-10 | DRG: 193 | Disposition: A | Discharge status: Hospice | Attending: Internal Medicine | Admitting: Internal Medicine

## 2012-05-04 DIAGNOSIS — I2510 Atherosclerotic heart disease of native coronary artery without angina pectoris: Secondary | ICD-10-CM | POA: Diagnosis present

## 2012-05-04 DIAGNOSIS — J151 Pneumonia due to Pseudomonas: Secondary | ICD-10-CM | POA: Diagnosis present

## 2012-05-04 DIAGNOSIS — D696 Thrombocytopenia, unspecified: Secondary | ICD-10-CM

## 2012-05-04 DIAGNOSIS — Z7901 Long term (current) use of anticoagulants: Secondary | ICD-10-CM

## 2012-05-04 DIAGNOSIS — I509 Heart failure, unspecified: Secondary | ICD-10-CM | POA: Diagnosis present

## 2012-05-04 DIAGNOSIS — J189 Pneumonia, unspecified organism: Principal | ICD-10-CM | POA: Diagnosis present

## 2012-05-04 DIAGNOSIS — I4891 Unspecified atrial fibrillation: Secondary | ICD-10-CM | POA: Diagnosis present

## 2012-05-04 DIAGNOSIS — Z954 Presence of other heart-valve replacement: Secondary | ICD-10-CM

## 2012-05-04 DIAGNOSIS — I5033 Acute on chronic diastolic (congestive) heart failure: Secondary | ICD-10-CM | POA: Diagnosis present

## 2012-05-04 DIAGNOSIS — Z87891 Personal history of nicotine dependence: Secondary | ICD-10-CM

## 2012-05-04 DIAGNOSIS — Z951 Presence of aortocoronary bypass graft: Secondary | ICD-10-CM

## 2012-05-04 DIAGNOSIS — J209 Acute bronchitis, unspecified: Secondary | ICD-10-CM

## 2012-05-04 DIAGNOSIS — R05 Cough: Secondary | ICD-10-CM

## 2012-05-04 DIAGNOSIS — D649 Anemia, unspecified: Secondary | ICD-10-CM

## 2012-05-04 DIAGNOSIS — B965 Pseudomonas (aeruginosa) (mallei) (pseudomallei) as the cause of diseases classified elsewhere: Secondary | ICD-10-CM | POA: Diagnosis present

## 2012-05-04 DIAGNOSIS — J449 Chronic obstructive pulmonary disease, unspecified: Secondary | ICD-10-CM | POA: Diagnosis present

## 2012-05-04 DIAGNOSIS — R0602 Shortness of breath: Secondary | ICD-10-CM

## 2012-05-04 DIAGNOSIS — D72829 Elevated white blood cell count, unspecified: Secondary | ICD-10-CM | POA: Diagnosis present

## 2012-05-04 DIAGNOSIS — J4489 Other specified chronic obstructive pulmonary disease: Secondary | ICD-10-CM | POA: Diagnosis present

## 2012-05-04 DIAGNOSIS — Z9981 Dependence on supplemental oxygen: Secondary | ICD-10-CM

## 2012-05-04 DIAGNOSIS — J9610 Chronic respiratory failure, unspecified whether with hypoxia or hypercapnia: Secondary | ICD-10-CM | POA: Diagnosis present

## 2012-05-04 DIAGNOSIS — R042 Hemoptysis: Secondary | ICD-10-CM

## 2012-05-04 DIAGNOSIS — Z950 Presence of cardiac pacemaker: Secondary | ICD-10-CM

## 2012-05-04 LAB — CBC
HCT: 35.3 % — ABNORMAL LOW (ref 36.0–46.0)
MCV: 82.1 fL (ref 78.0–100.0)
RBC: 4.3 MIL/uL (ref 3.87–5.11)
WBC: 21.6 10*3/uL — ABNORMAL HIGH (ref 4.0–10.5)

## 2012-05-04 LAB — CBC WITH DIFFERENTIAL/PLATELET
Basophils Absolute: 0 10*3/uL (ref 0.0–0.1)
HCT: 37.3 % (ref 36.0–46.0)
Hemoglobin: 12 g/dL (ref 12.0–15.0)
Lymphocytes Relative: 3 % — ABNORMAL LOW (ref 12–46)
Monocytes Absolute: 1 10*3/uL (ref 0.1–1.0)
Monocytes Relative: 4 % (ref 3–12)
Neutro Abs: 21.3 10*3/uL — ABNORMAL HIGH (ref 1.7–7.7)
Neutrophils Relative %: 93 % — ABNORMAL HIGH (ref 43–77)
WBC: 23 10*3/uL — ABNORMAL HIGH (ref 4.0–10.5)

## 2012-05-04 LAB — POCT I-STAT, CHEM 8
BUN: 11 mg/dL (ref 6–23)
Calcium, Ion: 1.12 mmol/L (ref 1.12–1.32)
Chloride: 96 mEq/L (ref 96–112)
Glucose, Bld: 104 mg/dL — ABNORMAL HIGH (ref 70–99)
HCT: 39 % (ref 36.0–46.0)
Potassium: 3.6 mEq/L (ref 3.5–5.1)

## 2012-05-04 LAB — PROTIME-INR
INR: 2.83 — ABNORMAL HIGH (ref 0.00–1.49)
Prothrombin Time: 30.2 seconds — ABNORMAL HIGH (ref 11.6–15.2)

## 2012-05-04 LAB — CREATININE, SERUM: GFR calc Af Amer: >90 mL/min (ref >90)

## 2012-05-04 MED ORDER — SENNA 8.6 MG PO TABS
1.0000 | ORAL_TABLET | Freq: Every day | ORAL | Status: DC
  Administered 2012-05-05 – 2012-05-09 (×5): 8.6 mg via ORAL
  Filled 2012-05-04 (×6): qty 1

## 2012-05-04 MED ORDER — SENNOSIDES 8.6 MG PO TABS: 1.0000 | ORAL_TABLET | Freq: Every evening | ORAL | Status: DC

## 2012-05-04 MED ORDER — TIOTROPIUM BROMIDE MONOHYDRATE 18 MCG IN CAPS
18.0000 ug | ORAL_CAPSULE | Freq: Every day | RESPIRATORY_TRACT | Status: DC
  Administered 2012-05-05 – 2012-05-10 (×6): 18 ug via RESPIRATORY_TRACT
  Filled 2012-05-04 (×2): qty 5

## 2012-05-04 MED ORDER — ENOXAPARIN SODIUM 30 MG/0.3ML ~~LOC~~ SOLN
30.0000 mg | SUBCUTANEOUS | Status: DC
  Filled 2012-05-04: qty 0.3

## 2012-05-04 MED ORDER — EZETIMIBE 10 MG PO TABS
10.0000 mg | ORAL_TABLET | Freq: Every evening | ORAL | Status: DC
  Administered 2012-05-04 – 2012-05-09 (×6): 10 mg via ORAL
  Filled 2012-05-04 (×7): qty 1

## 2012-05-04 MED ORDER — DIGOXIN 125 MCG PO TABS
125.0000 ug | ORAL_TABLET | Freq: Every evening | ORAL | Status: DC
  Administered 2012-05-04 – 2012-05-09 (×6): 125 ug via ORAL
  Filled 2012-05-04 (×7): qty 1

## 2012-05-04 MED ORDER — POTASSIUM CHLORIDE CRYS ER 20 MEQ PO TBCR
20.0000 meq | EXTENDED_RELEASE_TABLET | Freq: Two times a day (BID) | ORAL | Status: DC
  Administered 2012-05-04 – 2012-05-10 (×12): 20 meq via ORAL
  Filled 2012-05-04 (×15): qty 1

## 2012-05-04 MED ORDER — WARFARIN - PHARMACIST DOSING INPATIENT: Freq: Every day | Status: DC

## 2012-05-04 MED ORDER — VITAMIN C 500 MG PO TABS
500.0000 mg | ORAL_TABLET | Freq: Every day | ORAL | Status: DC
  Administered 2012-05-05 – 2012-05-10 (×6): 500 mg via ORAL
  Filled 2012-05-04 (×6): qty 1

## 2012-05-04 MED ORDER — LEVOFLOXACIN IN D5W 250 MG/50ML IV SOLN
250.0000 mg | INTRAVENOUS | Status: DC
  Administered 2012-05-05 – 2012-05-07 (×3): 250 mg via INTRAVENOUS
  Filled 2012-05-04 (×4): qty 50

## 2012-05-04 MED ORDER — WARFARIN 0.5 MG HALF TABLET
0.5000 mg | ORAL_TABLET | Freq: Once | ORAL | Status: DC
  Filled 2012-05-04 (×2): qty 1

## 2012-05-04 MED ORDER — MAGIC MOUTHWASH
5.0000 mL | Freq: Four times a day (QID) | ORAL | Status: DC | PRN
  Filled 2012-05-04: qty 5

## 2012-05-04 MED ORDER — DEXTROSE 5 % IV SOLN: 500.0000 mg | Freq: Once | INTRAVENOUS | Status: DC

## 2012-05-04 MED ORDER — MOMETASONE FURO-FORMOTEROL FUM 200-5 MCG/ACT IN AERO: 2.0000 | INHALATION_SPRAY | Freq: Two times a day (BID) | RESPIRATORY_TRACT | Status: DC

## 2012-05-04 MED ORDER — LEVOFLOXACIN IN D5W 500 MG/100ML IV SOLN
500.0000 mg | INTRAVENOUS | Status: DC
  Administered 2012-05-04: 500 mg via INTRAVENOUS
  Filled 2012-05-04: qty 100

## 2012-05-04 MED ORDER — SODIUM CHLORIDE 0.9 % IV SOLN: 250.0000 mL | INTRAVENOUS | Status: DC | PRN

## 2012-05-04 MED ORDER — FUROSEMIDE 20 MG PO TABS
20.0000 mg | ORAL_TABLET | Freq: Two times a day (BID) | ORAL | Status: DC
  Administered 2012-05-05 – 2012-05-10 (×11): 20 mg via ORAL
  Filled 2012-05-04 (×16): qty 1

## 2012-05-04 MED ORDER — MOMETASONE FUROATE 0.1 % EX CREA: 1.0000 "application " | TOPICAL_CREAM | CUTANEOUS | Status: DC | PRN

## 2012-05-04 MED ORDER — LEVALBUTEROL HCL 1.25 MG/0.5ML IN NEBU
1.2500 mg | INHALATION_SOLUTION | Freq: Four times a day (QID) | RESPIRATORY_TRACT | Status: DC
  Administered 2012-05-04 – 2012-05-05 (×3): 1.25 mg via RESPIRATORY_TRACT
  Filled 2012-05-04 (×8): qty 0.5

## 2012-05-04 MED ORDER — MONTELUKAST SODIUM 10 MG PO TABS
10.0000 mg | ORAL_TABLET | Freq: Every day | ORAL | Status: DC
  Administered 2012-05-04 – 2012-05-09 (×5): 10 mg via ORAL
  Filled 2012-05-04 (×7): qty 1

## 2012-05-04 MED ORDER — LEVALBUTEROL HCL 1.25 MG/3ML IN NEBU: 1.2500 mg | INHALATION_SOLUTION | Freq: Four times a day (QID) | RESPIRATORY_TRACT | Status: DC

## 2012-05-04 MED ORDER — PANTOPRAZOLE SODIUM 40 MG PO TBEC
40.0000 mg | DELAYED_RELEASE_TABLET | Freq: Every day | ORAL | Status: DC
  Administered 2012-05-05 – 2012-05-10 (×6): 40 mg via ORAL
  Filled 2012-05-04 (×8): qty 1

## 2012-05-04 MED ORDER — ALPRAZOLAM 0.25 MG PO TABS
0.2500 mg | ORAL_TABLET | Freq: Every evening | ORAL | Status: DC | PRN
  Administered 2012-05-05 – 2012-05-07 (×4): 0.25 mg via ORAL
  Filled 2012-05-04 (×4): qty 1

## 2012-05-04 MED ORDER — CALCIUM CITRATE-VITAMIN D 315-200 MG-UNIT PO TABS: 2.0000 | ORAL_TABLET | ORAL | Status: DC

## 2012-05-04 MED ORDER — SODIUM CHLORIDE 0.9 % IJ SOLN
3.0000 mL | INTRAMUSCULAR | Status: DC | PRN
  Administered 2012-05-07: 3 mL via INTRAVENOUS

## 2012-05-04 MED ORDER — CALCIUM CARBONATE-VITAMIN D 500-200 MG-UNIT PO TABS
2.0000 | ORAL_TABLET | Freq: Every day | ORAL | Status: DC
  Administered 2012-05-05 – 2012-05-10 (×6): 2 via ORAL
  Filled 2012-05-04 (×6): qty 2

## 2012-05-04 MED ORDER — GUAIFENESIN ER 600 MG PO TB12
1200.0000 mg | ORAL_TABLET | Freq: Two times a day (BID) | ORAL | Status: DC
  Administered 2012-05-04 – 2012-05-10 (×12): 1200 mg via ORAL
  Filled 2012-05-04 (×14): qty 2

## 2012-05-04 MED ORDER — DEXTROSE 5 % IV SOLN
1.0000 g | Freq: Once | INTRAVENOUS | Status: DC
  Filled 2012-05-04: qty 10

## 2012-05-04 MED ORDER — SOTALOL HCL 80 MG PO TABS
80.0000 mg | ORAL_TABLET | Freq: Two times a day (BID) | ORAL | Status: DC
  Administered 2012-05-04 – 2012-05-10 (×12): 80 mg via ORAL
  Filled 2012-05-04 (×16): qty 1

## 2012-05-04 MED ORDER — GUAIFENESIN ER 1200 MG PO TB12: 1200.0000 mg | ORAL_TABLET | Freq: Two times a day (BID) | ORAL | Status: DC

## 2012-05-04 MED ORDER — METHYLPREDNISOLONE 16 MG PO TABS
16.0000 mg | ORAL_TABLET | Freq: Every day | ORAL | Status: DC
  Administered 2012-05-05 – 2012-05-10 (×6): 16 mg via ORAL
  Filled 2012-05-04 (×8): qty 1

## 2012-05-04 MED ORDER — FERROUS SULFATE 325 (65 FE) MG PO TABS
325.0000 mg | ORAL_TABLET | Freq: Every day | ORAL | Status: DC
  Administered 2012-05-05 – 2012-05-10 (×6): 325 mg via ORAL
  Filled 2012-05-04 (×6): qty 1

## 2012-05-04 MED ORDER — DILTIAZEM HCL ER COATED BEADS 240 MG PO CP24
240.0000 mg | ORAL_CAPSULE | Freq: Every day | ORAL | Status: DC
  Administered 2012-05-05 – 2012-05-10 (×6): 240 mg via ORAL
  Filled 2012-05-04 (×9): qty 1

## 2012-05-04 MED ORDER — OMEGA-3-ACID ETHYL ESTERS 1 G PO CAPS
1.0000 g | ORAL_CAPSULE | Freq: Every day | ORAL | Status: DC
  Administered 2012-05-04 – 2012-05-10 (×7): 1 g via ORAL
  Filled 2012-05-04 (×7): qty 1

## 2012-05-04 MED ORDER — SODIUM CHLORIDE 0.9 % IJ SOLN
3.0000 mL | Freq: Two times a day (BID) | INTRAMUSCULAR | Status: DC
  Administered 2012-05-04 – 2012-05-10 (×11): 3 mL via INTRAVENOUS

## 2012-05-04 MED ORDER — SERTRALINE HCL 50 MG PO TABS
50.0000 mg | ORAL_TABLET | Freq: Every evening | ORAL | Status: DC
  Administered 2012-05-04 – 2012-05-09 (×6): 50 mg via ORAL
  Filled 2012-05-04 (×7): qty 1

## 2012-05-04 NOTE — Progress Notes (Signed)
  Pharmacy Note (Brief) - Levaquin Renal Adjustment  78 yo F started on Levaquin 500mg  IV q24h for CAP. Current CrCl ~ 41 ml/min. Will reduce dose to 250mg  IV q24h for CAP with CrCl 20-49 ml/min.  Darrol Angel, PharmD Pager: [phone] 05/04/2012 9:17 PM

## 2012-05-04 NOTE — ED Notes (Signed)
Received call from April Rn with Hospice [phone]

## 2012-05-04 NOTE — ED Notes (Signed)
Attempt to call floor to verify room availability, RN un available at this time, will return call 

## 2012-05-04 NOTE — ED Notes (Signed)
Report called to [facility] Rn awaiting bed to be ready in cleaning status at this time per Rn waiting for bed to be zapped

## 2012-05-04 NOTE — Progress Notes (Signed)
ANTICOAGULATION CONSULT NOTE - Initial Consult  Pharmacy Consult for Warfarin Indication: Afib  Allergies  Allergen Reactions  . Cephalexin     Unsure as to reaction.  ? Hives with Cephalexin; "I can take Penicillin"  . Atorvastatin     Forgetfulness   . Azithromycin Nausea And Vomiting  . Codeine Nausea And Vomiting  . Crestor (Rosuvastatin Calcium)     Forgetfulness    . Estradiol     Unknown reaction  . Hydrocortisone Hives    Flea bites were mistaken as rash from hydrocortisone  . Lovastatin Other (See Comments)    forgetfullness,  . Moxifloxacin     Unknown reaction  . Sulfonamide Derivatives     itching    Patient Measurements:    Vital Signs: Temp: 97.6 F (36.4 C) (06/30 1923) Temp src: Oral (06/30 1923) BP: 110/44 mmHg (06/30 1923) Pulse Rate: 72  (06/30 1923)  Labs:  Basename 05/04/12 1521 05/04/12 1513 05/02/12  HGB 13.3 12.0 --  HCT 39.0 37.3 --  PLT -- 152 --  APTT -- -- --  LABPROT -- 30.2* --  INR -- 2.83* 2.5  HEPARINUNFRC -- -- --  CREATININE 0.80 -- --  CKTOTAL -- -- --  CKMB -- -- --  TROPONINI -- -- --    The CrCl is unknown because both a height and weight (above a minimum accepted value) are required for this calculation.   Medical History: Past Medical History  Diagnosis Date  . Asthmatic bronchitis   . Allergic rhinitis   . Atrial fibrillation   . History of aortic valve replacement Dr. Tyrone Sage 2006    Has severe residual gradient and will be managed conservatively  . Sleep apnea   . Chronic obstructive asthma   . Osteoporosis   . Pruritic disorder   . Diverticulosis   . Hemorrhoids   . GERD (gastroesophageal reflux disease)   . IBS (irritable bowel syndrome)   . Pacemaker 2010  . Oxygen dependent   . Atrial flutter     on Betapace  . High risk medication use     on Betapace & Coumadin  . Chronic anticoagulation   . COPD (chronic obstructive pulmonary disease)   . Pneumonia   . Anemia   . Arthritis   .  Hypertension   . Coronary artery disease   . Pneumonia 09/2011    in NH for Rehab post discharge until 12/18    Medications:  Scheduled:    . calcium citrate-vitamin D  2 tablet Oral PC lunch  . digoxin  125 mcg Oral QPM  . diltiazem  240 mg Oral Daily  . enoxaparin  30 mg Subcutaneous Q24H  . ezetimibe  10 mg Oral QPM  . ferrous sulfate  325 mg Oral Q lunch  . furosemide  20 mg Oral BID  . Guaifenesin  1,200 mg Oral BID  . levalbuterol  1.25 mg Nebulization Q6H  . levofloxacin (LEVAQUIN) IV  500 mg Intravenous Q24H  . methylPREDNISolone  16 mg Oral Q breakfast  . Mometasone Furo-Formoterol Fum  2 puff Inhalation BID  . montelukast  10 mg Oral QHS  . omega-3 acid ethyl esters  1 g Oral Daily  . pantoprazole  40 mg Oral Daily  . potassium chloride SA  20 mEq Oral BID  . senna  1 tablet Oral QPM  . sertraline  50 mg Oral QPM  . sodium chloride  3 mL Intravenous Q12H  . sotalol  80 mg Oral BID  .  tiotropium  18 mcg Inhalation Daily  . vitamin C  500 mg Oral Daily  . DISCONTD: azithromycin  500 mg Intravenous Once  . DISCONTD: cefTRIAXone (ROCEPHIN)  IV  1 g Intravenous Once   Infusions:   PRN: sodium chloride, ALPRAZolam, magic mouthwash, mometasone, sodium chloride  Assessment: 78 yo F admitted 6/30 with PNA and hemoptysis, on chronic warfarin for Hx of Afib (also has bioprosthetic AVR). Home dose of warfarin reported as 1mg  PO on Sun, Mon, Wed, Thurs, Sat, and takes 0.5mg  Tue and Fri (last dose 6/29 per Med Rec). Pt informed physician that she is very sensitive to warfarin and antibiotics. Current INR is therapeutic, but at upper end of goal range. Since pt has been started on Levaquin (possible increase in INR) and since pt reported hemoptysis, will use lower-than-normal warfarin dose tonight.  Goal of Therapy:  INR 2-3 (hx Afib)   Plan:  1) Warfarin 0.5mg  PO x1 tonight 2) Daily INR  Darrol Angel, PharmD Pager: [phone] 05/04/2012,8:35 PM

## 2012-05-04 NOTE — ED Provider Notes (Addendum)
History     CSN: [id number]  Arrival date & time 05/04/12  1304   First MD Initiated Contact with Patient 05/04/12 1401      Chief Complaint  Patient presents with  . Shortness of Breath  . Hemoptysis    (Consider location/radiation/quality/duration/timing/severity/associated sxs/prior treatment) HPI Of progressively worsening shortness of breath onset yesterday. Patient also coughed up large amount of blood today. No fever no other complaint. Treated by EMS with supplemental oxygen, nonrebreather mask at 100%. No pain. Past Medical History  Diagnosis Date  . Asthmatic bronchitis   . Allergic rhinitis   . Atrial fibrillation   . History of aortic valve replacement Dr. Tyrone Sage 2006    Has severe residual gradient and will be managed conservatively  . Sleep apnea   . Chronic obstructive asthma   . Osteoporosis   . Pruritic disorder   . Diverticulosis   . Hemorrhoids   . GERD (gastroesophageal reflux disease)   . IBS (irritable bowel syndrome)   . Pacemaker 2010  . Oxygen dependent   . Atrial flutter     on Betapace  . High risk medication use     on Betapace & Coumadin  . Chronic anticoagulation   . COPD (chronic obstructive pulmonary disease)   . Pneumonia   . Anemia   . Arthritis   . Hypertension   . Coronary artery disease   . Pneumonia 09/2011    in NH for Rehab post discharge until 12/18    Past Surgical History  Procedure Date  . Back surgery 1994  . Tonsillectomy   . Appendectomy   . Ganglion cyst excision   . Vocal cord polyps 1975 and 1984  . Aortic valve replacement 2006    Bioprosthetic  . Pacemaker insertion 2010  . Cardiac electrophysiology mapping and ablation   . Coronary artery bypass graft   . Insert / replace / remove pacemaker   . Eye surgery   . Cardiac catheterization   . Coronary angioplasty   . Cardiac valve replacement     Family History  Problem Relation Age of Onset  . Kidney failure Father   . Colon cancer Paternal Aunt    . Heart disease Maternal Grandfather   . Heart attack Paternal Grandfather   . Breast cancer Daughter   . Stroke Maternal Grandmother   . Heart disease Mother     History  Substance Use Topics  . Smoking status: Former Smoker -- 1.0 packs/day for 50 years    Types: Cigarettes    Quit date: 11/05/1993  . Smokeless tobacco: Never Used  . Alcohol Use: No    OB History    Grav Para Term Preterm Abortions TAB SAB Ect Mult Living                  Review of Systems  Constitutional: Negative.   HENT: Negative.   Respiratory: Positive for cough and shortness of breath.        Hemoptysis  Cardiovascular: Negative.   Gastrointestinal: Negative.   Musculoskeletal: Negative.   Skin: Negative.   Neurological: Negative.   Hematological: Negative.   Psychiatric/Behavioral: Negative.   All other systems reviewed and are negative.    Allergies  Cephalexin; Atorvastatin; Codeine; Crestor; Estradiol; Hydrocortisone; Lovastatin; Moxifloxacin; and Sulfonamide derivatives  Home Medications   Current Outpatient Rx  Name Route Sig Dispense Refill  . ALPRAZOLAM 0.25 MG PO TABS Oral Take 0.25 mg by mouth at bedtime as needed. For anxiety    .  MAGIC MOUTHWASH Oral Take 5 mLs by mouth 4 (four) times daily as needed. For mouth irritations     . CALCIUM CITRATE + D PO Oral Take 1 tablet by mouth daily.    Marland Kitchen DIGOXIN 0.125 MG PO TABS Oral Take 125 mcg by mouth every evening.     Marland Kitchen DILTIAZEM HCL ER COATED BEADS 240 MG PO CP24 Oral Take 240 mg by mouth daily.    Marland Kitchen DIPHENHYDRAMINE HCL 12.5 MG/5ML PO LIQD Oral Take 12.5 mg by mouth 4 (four) times daily as needed. For allergies    . ESOMEPRAZOLE MAGNESIUM 40 MG PO CPDR Oral Take 40 mg by mouth daily before breakfast.    . EZETIMIBE 10 MG PO TABS Oral Take 10 mg by mouth every evening.    Marland Kitchen FERROUS SULFATE 325 (65 FE) MG PO TABS Oral Take 325 mg by mouth daily with breakfast.    . FUROSEMIDE 40 MG PO TABS Oral Take 0.5 tablets (20 mg total) by  mouth 2 (two) times daily. 60 tablet 5  . GUAIFENESIN ER 1200 MG PO TB12 Oral Take 1 tablet (1,200 mg total) by mouth 2 (two) times daily at 10 AM and 5 PM. 14 each 0    PLEASE NOTE DOSAGE - PATIENT TAKES 1200 MG BID.  Marland Kitchen LEVALBUTEROL TARTRATE 45 MCG/ACT IN AERO Inhalation Inhale 2 puffs into the lungs every 4 (four) hours as needed. For shortness of breath    . LEVALBUTEROL HCL 1.25 MG/3ML IN NEBU Nebulization Take 1.25 mg by nebulization every 6 (six) hours.    . METHYLPREDNISOLONE 8 MG PO TABS Oral Take 8 mg by mouth daily.    . MOMETASONE FUROATE 0.1 % EX CREA Topical Apply 1 application topically as needed. For dry skin    . MOMETASONE FURO-FORMOTEROL FUM 200-5 MCG/ACT IN AERO Inhalation Inhale 2 puffs into the lungs 2 (two) times daily.    Marland Kitchen MONTELUKAST SODIUM 10 MG PO TABS Oral Take 10 mg by mouth at bedtime.    Marland Kitchen FISH OIL 1200 MG PO CAPS Oral Take 1 capsule by mouth daily.    Marland Kitchen POTASSIUM CHLORIDE CRYS ER 20 MEQ PO TBCR Oral Take 1 tablet (20 mEq total) by mouth 2 (two) times daily. 60 tablet 11    90 day supply is acceptable  . SENNOSIDES 8.6 MG PO TABS Oral Take 1 tablet by mouth every evening.    Marland Kitchen SERTRALINE HCL 50 MG PO TABS Oral Take 50 mg by mouth every evening.     Marland Kitchen SOTALOL HCL 80 MG PO TABS Oral Take 80 mg by mouth 2 (two) times daily.    Marland Kitchen TIOTROPIUM BROMIDE MONOHYDRATE 18 MCG IN CAPS Inhalation Place 18 mcg into inhaler and inhale daily.    Marland Kitchen VITAMIN C 500 MG PO TABS Oral Take 500 mg by mouth daily.    . WARFARIN SODIUM 1 MG PO TABS Oral Take 1 mg by mouth as directed. Take 1 mg on Sunday, Monday, Wednesday, Thursdays and Saturday. Take 0.5 mg on Tuesdays and Fridays.    Marland Kitchen EPINEPHRINE 0.15 MG/0.3ML IJ DEVI Intramuscular Inject 0.3 mLs (0.15 mg total) into the muscle as needed for anaphylaxis. 1 each prn    BP 108/79  Pulse 76  Temp 98.8 F (37.1 C) (Oral)  Resp 22  SpO2 90%  Physical Exam  Nursing note and vitals reviewed. Constitutional: She appears well-developed and  well-nourished.  HENT:  Head: Normocephalic and atraumatic.  Eyes: Conjunctivae are normal. Pupils are equal, round,  and reactive to light.  Neck: Neck supple. No tracheal deviation present. No thyromegaly present.  Cardiovascular: Normal rate and regular rhythm.   No murmur heard. Pulmonary/Chest: Effort normal and breath sounds normal.       Mildly dyspneic speaks in sentences  Abdominal: Soft. Bowel sounds are normal. She exhibits no distension. There is no tenderness.  Musculoskeletal: Normal range of motion. She exhibits no edema and no tenderness.  Neurological: She is alert. Coordination normal.  Skin: Skin is warm and dry. No rash noted.  Psychiatric: She has a normal mood and affect.    ED Course  Procedures (including critical care time)  Labs Reviewed - No data to display No results found.  Date: 05/04/2012  Rate: 80  Rhythm: normal sinus rhythm  QRS Axis: left  Intervals: normal  ST/T Wave abnormalities: normal and nonspecific T wave changes  Conduction Disutrbances:none  Narrative Interpretation:   Old EKG Reviewed: No significant change from 12/10/2011 interpreted by me   No diagnosis found. Results for orders placed during the hospital encounter of 05/04/12  PROTIME-INR      Component Value Range   Prothrombin Time 30.2 (*) 11.6 - 15.2 seconds   INR 2.83 (*) 0.00 - 1.49  CBC WITH DIFFERENTIAL      Component Value Range   WBC 23.0 (*) 4.0 - 10.5 K/uL   RBC 4.54  3.87 - 5.11 MIL/uL   Hemoglobin 12.0  12.0 - 15.0 g/dL   HCT 11.9  14.7 - 82.9 %   MCV 82.2  78.0 - 100.0 fL   MCH 26.4  26.0 - 34.0 pg   MCHC 32.2  30.0 - 36.0 g/dL   RDW 56.2 (*) 13.0 - 86.5 %   Platelets 152  150 - 400 K/uL   Neutrophils Relative 93 (*) 43 - 77 %   Neutro Abs 21.3 (*) 1.7 - 7.7 K/uL   Lymphocytes Relative 3 (*) 12 - 46 %   Lymphs Abs 0.7  0.7 - 4.0 K/uL   Monocytes Relative 4  3 - 12 %   Monocytes Absolute 1.0  0.1 - 1.0 K/uL   Eosinophils Relative 0  0 - 5 %    Eosinophils Absolute 0.0  0.0 - 0.7 K/uL   Basophils Relative 0  0 - 1 %   Basophils Absolute 0.0  0.0 - 0.1 K/uL  POCT I-STAT, CHEM 8      Component Value Range   Sodium 134 (*) 135 - 145 mEq/L   Potassium 3.6  3.5 - 5.1 mEq/L   Chloride 96  96 - 112 mEq/L   BUN 11  6 - 23 mg/dL   Creatinine, Ser 7.84  0.50 - 1.10 mg/dL   Glucose, Bld 696 (*) 70 - 99 mg/dL   Calcium, Ion 2.95  2.84 - 1.32 mmol/L   TCO2 27  0 - 100 mmol/L   Hemoglobin 13.3  12.0 - 15.0 g/dL   HCT 13.2  44.0 - 10.2 %   Dg Chest 2 View  05/04/2012  *RADIOLOGY REPORT*  Clinical Data: Shortness of breath and hemoptysis  CHEST - 2 VIEW  Comparison: 12/17/2011  Findings: There is a right chest wall pacer device with lead in the right atrial appendage and right ventricle.  Previous median sternotomy CABG procedure.  There is a left pleural effusion. Subpleural consolidation in the periphery of the right upper lobe and airspace disease in the left lower lobe is noted.  IMPRESSION:  1.  Bilateral airspace opacities which  may represent multifocal infection. 2.  Suspect left pleural effusion.  Original Report Authenticated By: Rosealee Albee, M.D.    Chest xray reviewed by me. Case discussed with Dr. Benjamine Mola  MDM  Plan admission to palliative care floor supplemental oxygen Antibiotics. Levaquin ordered as patient reports that she's done well with Levaquin in the past and has no adverse reaction to Levaquin At this point patient is full code Diagnosis #1 community-acquired pneumonia #2 hemoptysis #3 sepsis       Doug Sou, MD 05/04/12 1625  Doug Sou, MD 05/04/12 1610  Doug Sou, MD 05/04/12 1635

## 2012-05-04 NOTE — ED Notes (Signed)
Pt presenting to ed with c/o shortness of breath pt states she thinks she has pneumonia again. Pt states she thinks she over exhausted herself earlier in the week and yesterday she was as weak as water and she noticed blood in her sputum x 2 days ago

## 2012-05-04 NOTE — H&P (Signed)
Patient's PCP: [name], MD  Chief Complaint: hemoptysis  History of Present Illness: [name] is a 78 y.o. white female who is followed by hospice.  She has been having increasing fatigue and SOB along with more hemoptysis than usual.  She has a complicated medical history.  Despite being on hospice she is a full code.   No CP. C/o fever- subjective.  Follow with Dr. Maple Hudson pulmonologist.  On 4L of O2 at home.  In ER she was found to have a PNA that could be causing her hempotysis.  Er wanted admitted for further work up   Meds: Scheduled Meds:    . levofloxacin (LEVAQUIN) IV  500 mg Intravenous Q24H  . DISCONTD: azithromycin  500 mg Intravenous Once  . DISCONTD: cefTRIAXone (ROCEPHIN)  IV  1 g Intravenous Once   Continuous Infusions:  PRN Meds:. Allergies: Cephalexin; Atorvastatin; Codeine; Crestor; Estradiol; Hydrocortisone; Lovastatin; Moxifloxacin; and Sulfonamide derivatives Past Medical History  Diagnosis Date  . Asthmatic bronchitis   . Allergic rhinitis   . Atrial fibrillation   . History of aortic valve replacement Dr. Tyrone Sage 2006    Has severe residual gradient and will be managed conservatively  . Sleep apnea   . Chronic obstructive asthma   . Osteoporosis   . Pruritic disorder   . Diverticulosis   . Hemorrhoids   . GERD (gastroesophageal reflux disease)   . IBS (irritable bowel syndrome)   . Pacemaker 2010  . Oxygen dependent   . Atrial flutter     on Betapace  . High risk medication use     on Betapace & Coumadin  . Chronic anticoagulation   . COPD (chronic obstructive pulmonary disease)   . Pneumonia   . Anemia   . Arthritis   . Hypertension   . Coronary artery disease   . Pneumonia 09/2011    in NH for Rehab post discharge until 12/18   Past Surgical History  Procedure Date  . Back surgery 1994  . Tonsillectomy   . Appendectomy   . Ganglion cyst excision   . Vocal cord polyps 1975 and 1984  . Aortic valve replacement 2006   Bioprosthetic  . Pacemaker insertion 2010  . Cardiac electrophysiology mapping and ablation   . Coronary artery bypass graft   . Insert / replace / remove pacemaker   . Eye surgery   . Cardiac catheterization   . Coronary angioplasty   . Cardiac valve replacement    Family History  Problem Relation Age of Onset  . Kidney failure Father   . Colon cancer Paternal Aunt   . Heart disease Maternal Grandfather   . Heart attack Paternal Grandfather   . Breast cancer Daughter   . Stroke Maternal Grandmother   . Heart disease Mother    History   Social History  . Marital Status: Married    Spouse Name: N/A    Number of Children: N/A  . Years of Education: N/A   Occupational History  . retired    Social History Main Topics  . Smoking status: Former Smoker -- 1.0 packs/day for 50 years    Types: Cigarettes    Quit date: 11/05/1993  . Smokeless tobacco: Never Used  . Alcohol Use: No  . Drug Use: No  . Sexually Active: No   Other Topics Concern  . Not on file   Social History Narrative  . No narrative on file   Review of Systems: All systems reviewed with the patient and  positive as per history of present illness, otherwise all other systems are negative.  Physical Exam: Blood pressure 104/83, pulse 72, temperature 98.6 F (37 C), temperature source Oral, resp. rate 21, SpO2 89.00%. General: Awake, Oriented x3, No acute distress. HEENT: EOMI, dry mucous membranes Neck: Supple CV: S1 and S2, irregular Lungs: Dec BS B/L Abdomen: Soft, Nontender, Nondistended, +bowel sounds. Ext: Good pulses. Trace edema. No clubbing or cyanosis noted. Neuro: Cranial Nerves II-XII grossly intact. Has 5/5 motor strength in upper and lower extremities. Skin: multiple areas of bruising   Lab results:  Basename 05/04/12 1521  NA 134*  K 3.6  CL 96  CO2 --  GLUCOSE 104*  BUN 11  CREATININE 0.80  CALCIUM --  MG --  PHOS --   No results found for this basename:  AST:2,ALT:2,ALKPHOS:2,BILITOT:2,PROT:2,ALBUMIN:2 in the last 72 hours No results found for this basename: LIPASE:2,AMYLASE:2 in the last 72 hours  Basename 05/04/12 1521 05/04/12 1513  WBC -- 23.0*  NEUTROABS -- 21.3*  HGB 13.3 12.0  HCT 39.0 37.3  MCV -- 82.2  PLT -- 152   No results found for this basename: CKTOTAL:3,CKMB:3,CKMBINDEX:3,TROPONINI:3 in the last 72 hours No components found with this basename: POCBNP:3 No results found for this basename: DDIMER in the last 72 hours No results found for this basename: HGBA1C:2 in the last 72 hours No results found for this basename: CHOL:2,HDL:2,LDLCALC:2,TRIG:2,CHOLHDL:2,LDLDIRECT:2 in the last 72 hours No results found for this basename: TSH,T4TOTAL,FREET3,T3FREE,THYROIDAB in the last 72 hours No results found for this basename: VITAMINB12:2,FOLATE:2,FERRITIN:2,TIBC:2,IRON:2,RETICCTPCT:2 in the last 72 hours Imaging results:  Dg Chest 2 View  05/04/2012  *RADIOLOGY REPORT*  Clinical Data: Shortness of breath and hemoptysis  CHEST - 2 VIEW  Comparison: 12/17/2011  Findings: There is a right chest wall pacer device with lead in the right atrial appendage and right ventricle.  Previous median sternotomy CABG procedure.  There is a left pleural effusion. Subpleural consolidation in the periphery of the right upper lobe and airspace disease in the left lower lobe is noted.  IMPRESSION:  1.  Bilateral airspace opacities which may represent multifocal infection. 2.  Suspect left pleural effusion.  Original Report Authenticated By: Rosealee Albee, M.D.    Assessment & Plan by Problem:   *Pneumonia- levaquin, has not been in hospital recently.  Increase steroids, nebs   HEMOPTYSIS UNSPECIFIED- on coumadin, ? Related to cough, if worsens may need pulmonary consulted and bronch, hgb stable thus far   Leukocytosis- continue abx and trend, baseline about 13   SOB (shortness of breath)- improved in ER  A fib- rate controlled, coumadin per  pharmacy  Chronic Diastolic CHF- lasix  Patient and family are very particular about how her medicines are given as she takes at home, I did let them know that this may not be possible in the hospital but I will try to get them as close as possible  Full code     Time spent on admission, talking to the patient, and coordinating care was: 65 mins.  Azrael Huss, DO 05/04/2012, 5:30 PM

## 2012-05-04 NOTE — ED Notes (Signed)
GNF:AOZH<YQ> Expected date:05/04/12<BR> Expected time:12:52 PM<BR> Means of arrival:Ambulance<BR> Comments:<BR> Spitting up large amt of blood

## 2012-05-05 DIAGNOSIS — J189 Pneumonia, unspecified organism: Principal | ICD-10-CM

## 2012-05-05 DIAGNOSIS — D72829 Elevated white blood cell count, unspecified: Secondary | ICD-10-CM

## 2012-05-05 DIAGNOSIS — R042 Hemoptysis: Secondary | ICD-10-CM

## 2012-05-05 DIAGNOSIS — I4891 Unspecified atrial fibrillation: Secondary | ICD-10-CM

## 2012-05-05 LAB — PROTIME-INR: Prothrombin Time: 31.7 seconds — ABNORMAL HIGH (ref 11.6–15.2)

## 2012-05-05 LAB — BASIC METABOLIC PANEL
GFR calc Af Amer: >90 mL/min (ref >90)
GFR calc non Af Amer: 83 mL/min — ABNORMAL LOW (ref >90)
Glucose, Bld: 113 mg/dL — ABNORMAL HIGH (ref 70–99)
Potassium: 4 mEq/L (ref 3.5–5.1)
Sodium: 132 mEq/L — ABNORMAL LOW (ref 135–145)

## 2012-05-05 LAB — EXPECTORATED SPUTUM ASSESSMENT W GRAM STAIN, RFLX TO RESP C

## 2012-05-05 LAB — CBC
Hemoglobin: 11 g/dL — ABNORMAL LOW (ref 12.0–15.0)
RBC: 4.19 MIL/uL (ref 3.87–5.11)

## 2012-05-05 MED ORDER — LEVALBUTEROL HCL 1.25 MG/0.5ML IN NEBU
1.2500 mg | INHALATION_SOLUTION | RESPIRATORY_TRACT | Status: DC | PRN
  Administered 2012-05-05: 1.25 mg via RESPIRATORY_TRACT
  Filled 2012-05-05: qty 0.5

## 2012-05-05 MED ORDER — LEVALBUTEROL HCL 1.25 MG/0.5ML IN NEBU
1.2500 mg | INHALATION_SOLUTION | Freq: Four times a day (QID) | RESPIRATORY_TRACT | Status: DC
  Administered 2012-05-05 – 2012-05-08 (×12): 1.25 mg via RESPIRATORY_TRACT
  Filled 2012-05-05 (×15): qty 0.5

## 2012-05-05 NOTE — Progress Notes (Signed)
[facility]:Sw note  Chart reviewed. Pt has been under hospice services since Spring 2013, Pt does remain a full code as she states "I am not quite ready for that yet." Pt was sitting in bed eating her lunch. Pt reported feeling much better since hospital admission. Pt voiced that she has avoided hospitalization twice in the last few months but stated "I just felt so awful that I had to come to the hospital this time. " Pt plan at this time is to return home with caregivers. Sw provided active/reflective listening as Pt talked about how she was feeling. Sw will continue to follow for support during hospital stay.  Lorain Childes. LCSW 05/05/12 12:30pm

## 2012-05-05 NOTE — Progress Notes (Addendum)
ANTICOAGULATION CONSULT NOTE - Follow Up Consult  Pharmacy Consult for:  Coumadin Indication: Atrial fibrillation  Allergies  Allergen Reactions  . Cephalexin     Unsure as to reaction.  ? Hives with Cephalexin; "I can take Penicillin"  . Atorvastatin     Forgetfulness   . Azithromycin Nausea And Vomiting  . Codeine Nausea And Vomiting  . Crestor (Rosuvastatin Calcium)     Forgetfulness    . Estradiol     Unknown reaction  . Hydrocortisone Hives    Flea bites were mistaken as rash from hydrocortisone  . Lovastatin Other (See Comments)    forgetfullness,  . Moxifloxacin     Unknown reaction  . Sulfonamide Derivatives     itching    Patient Measurements: Height: 5\' 2"  (157.5 cm) Weight: 121 lb 6.4 oz (55.067 kg) IBW/kg (Calculated) : 50.1   Vital Signs: Temp: 97.6 F (36.4 C) (07/01 0600) Temp src: Oral (07/01 0600) BP: 105/62 mmHg (07/01 0600) Pulse Rate: 64  (07/01 0600)  Labs:  Basename 05/05/12 0458 05/04/12 2122 05/04/12 1521 05/04/12 1513  HGB 11.0* 11.2* -- --  HCT 34.5* 35.3* 39.0 --  PLT 145* 146* -- 152  APTT -- -- -- --  LABPROT 31.7* -- -- 30.2*  INR 3.01* -- -- 2.83*  HEPARINUNFRC -- -- -- --  CREATININE 0.56 0.64 0.80 --  CKTOTAL -- -- -- --  CKMB -- -- -- --  TROPONINI -- -- -- --    Estimated Creatinine Clearance: 40.7 ml/min (by C-G formula based on Cr of 0.56).   Medications:  Scheduled:    . calcium-vitamin D  2 tablet Oral Q1200  . digoxin  125 mcg Oral QPM  . diltiazem  240 mg Oral Daily  . enoxaparin  30 mg Subcutaneous Q24H  . ezetimibe  10 mg Oral QPM  . ferrous sulfate  325 mg Oral Q lunch  . furosemide  20 mg Oral BID  . guaiFENesin  1,200 mg Oral BID  . levalbuterol  1.25 mg Nebulization Q6H  . levofloxacin (LEVAQUIN) IV  250 mg Intravenous Q24H  . methylPREDNISolone  16 mg Oral Q breakfast  . montelukast  10 mg Oral QHS  . omega-3 acid ethyl esters  1 g Oral Daily  . pantoprazole  40 mg Oral Daily  . potassium  chloride SA  20 mEq Oral BID  . senna  1 tablet Oral QHS  . sertraline  50 mg Oral QPM  . sodium chloride  3 mL Intravenous Q12H  . sotalol  80 mg Oral BID  . tiotropium  18 mcg Inhalation Daily  . vitamin C  500 mg Oral Daily  . warfarin  0.5 mg Oral Once  . Warfarin - Pharmacist Dosing Inpatient   Does not apply q1800  . DISCONTD: azithromycin  500 mg Intravenous Once  . DISCONTD: calcium citrate-vitamin D  2 tablet Oral PC lunch  . DISCONTD: cefTRIAXone (ROCEPHIN)  IV  1 g Intravenous Once  . DISCONTD: Guaifenesin  1,200 mg Oral BID  . DISCONTD: levalbuterol  1.25 mg Nebulization Q6H  . DISCONTD: levofloxacin (LEVAQUIN) IV  500 mg Intravenous Q24H  . DISCONTD: Mometasone Furo-Formoterol Fum  2 puff Inhalation BID  . DISCONTD: Mometasone Furo-Formoterol Fum  2 puff Inhalation BID  . DISCONTD: senna  1 tablet Oral QPM    Assessment:  Assisting with Coumadin therapy for this 78 year-old female on chronic anticoagulation for atrial fibrillation (also has bioprosthetic AVR).  PTA dose documented as  1mg  on Sun, Mon, Wed, Thurs, Sat, and  0.5mg  on Tues and Fri.  Hemoptysis reported on admission, with pneumonia as possible cause.  RN reports very small amount of blood-tinged sputum obtained while collecting sputum for cultures.  Today's INR is supratherapeutic at 3.01 after 0.5 mg dose on 6/30.  Receiving Levaquin, which may increase the anticoagulant effect of Coumadin.  Goal of Therapy:  INR 2-3   Plan:   Hold Coumadin dose today.  Follow PT/INR daily.  Recommend discontinuing Lovenox.  Polo Riley R.Ph. 05/05/2012,10:01 AM

## 2012-05-05 NOTE — Progress Notes (Addendum)
Per RN, Pt from home, not an ALF, as stated in Nursing Admin Summary.

## 2012-05-05 NOTE — Progress Notes (Signed)
[facility] Chaplain visit:  Pt awake in bed, O2 in use, open and thankful for visit of encouragement, referring to SW [place] visiting earlier.  Pt open about her having a 5th pneumonia, and how she did not want to come to the hospital at first, "but I was feeling so bad, and my O2 sats were so low, that I felt like I was dying, and I didn't want that."  Pt open about her faith as a resource for comfort and strength, and how her family "needs a break too."  Pt thankful for encouragement and prayer. Lovenia Shuck, [facility] Chaplain

## 2012-05-05 NOTE — Progress Notes (Signed)
CARE MANAGEMENT NOTE 05/05/2012  Patient:  [name], [name]   Account Number:  [id number]  Date Initiated:  05/05/2012  Documentation initiated by:  Maccoy Haubner  Subjective/Objective Assessment:   pt with histoory of lung ca, admitted with sob and confirmed pna     Action/Plan:   home with hospice   Anticipated DC Date:  05/08/2012   Anticipated DC Plan:  HOME W HOSPICE CARE  In-house referral  NA      DC Planning Services  CM consult      PAC Choice  NA   Choice offered to / List presented to:  NA   DME arranged  NA      DME agency  NA     HH arranged  NA      HH agency  NA   Status of service:  In process, will continue to follow Medicare Important Message given?  NA - LOS <3 / Initial given by admissions (If response is "NO", the following Medicare IM given date fields will be blank) Date Medicare IM given:   Date Additional Medicare IM given:    Discharge Disposition:    Per UR Regulation:  Reviewed for med. necessity/level of care/duration of stay  If discussed at Long Length of Stay Meetings, dates discussed:    Comments:  [id number] Marcelle Smiling, RN, BSN, CCM No discharge needs present at time of this review Case Management [phone]

## 2012-05-05 NOTE — Progress Notes (Signed)
[facility] PROGRESS NOTE  [name] WUJ:[id number] DOB: 01-Oct-1924 DOA: 05/04/2012 PCP: [name], MD  Assessment/Plan: 1. Pneumonia- levaquin, has not been in hospital recently. Increase steroids, nebs   2. HEMOPTYSIS UNSPECIFIED- on coumadin, ? Related to cough, hgb stable thus far - seems to be decreased  3. Leukocytosis- continue abx and trend, baseline about 13 - improved  4. SOB (shortness of breath)- improved in ER   5. A fib- rate controlled, coumadin per pharmacy   6. Chronic Diastolic CHF- lasix  7. Chronic respiratory failure- 4L O2 at home   Code Status: full Family Communication: daughter at bedside Disposition Plan:  Home with caregivers  [name], D.O.  [facility] Pager [phone]  05/05/2012, 10:13 AM  LOS: 1 day      Subjective: Patient says breathing better but coughing not No CP Eating well- appetite increased   Objective: Filed Vitals:   05/04/12 2034 05/04/12 2311 05/05/12 0545 05/05/12 0600  BP: 118/66   105/62  Pulse: 73 60 62 64  Temp: 98.1 F (36.7 C)   97.6 F (36.4 C)  TempSrc: Oral   Oral  Resp: 20 18 18 18   Height: 5\' 2"  (1.575 m)     Weight: 55.067 kg (121 lb 6.4 oz)     SpO2: 92% 90% 90% 93%   No intake or output data in the 24 hours ending 05/05/12 1013  Exam:  General: Awake, Oriented x3, No acute distress.  HEENT: EOMI, dry mucous membranes  Neck: Supple  CV: S1 and S2, irregular  Lungs: Dec BS B/L  Abdomen: Soft, Nontender, Nondistended, +bowel sounds.  Ext: Good pulses. Trace edema. No clubbing or cyanosis noted.  Neuro: Cranial Nerves II-XII grossly intact. Has 5/5 motor strength in upper and lower extremities.  Skin: multiple areas of bruising   Data Reviewed: Basic Metabolic Panel:  Lab 05/05/12 8657 05/04/12 2122 05/04/12 1521  NA 132* -- 134*  K 4.0 -- 3.6  CL 97 -- 96  CO2 28 -- --  GLUCOSE 113* -- 104*  BUN 12 -- 11  CREATININE 0.56 0.64 0.80  CALCIUM  8.9 -- --  MG -- -- --  PHOS -- -- --   Liver Function Tests: No results found for this basename: AST:5,ALT:5,ALKPHOS:5,BILITOT:5,PROT:5,ALBUMIN:5 in the last 168 hours No results found for this basename: LIPASE:5,AMYLASE:5 in the last 168 hours No results found for this basename: AMMONIA:5 in the last 168 hours CBC:  Lab 05/05/12 0458 05/04/12 2122 05/04/12 1521 05/04/12 1513  WBC 16.0* 21.6* -- 23.0*  NEUTROABS -- -- -- 21.3*  HGB 11.0* 11.2* 13.3 12.0  HCT 34.5* 35.3* 39.0 37.3  MCV 82.3 82.1 -- 82.2  PLT 145* 146* -- 152   Cardiac Enzymes: No results found for this basename: CKTOTAL:5,CKMB:5,CKMBINDEX:5,TROPONINI:5 in the last 168 hours BNP: No components found with this basename: POCBNP:5 CBG: No results found for this basename: GLUCAP:5 in the last 168 hours  No results found for this or any previous visit (from the past 240 hour(s)).   Studies: Dg Chest 2 View  05/04/2012  *RADIOLOGY REPORT*  Clinical Data: Shortness of breath and hemoptysis  CHEST - 2 VIEW  Comparison: 12/17/2011  Findings: There is a right chest wall pacer device with lead in the right atrial appendage and right ventricle.  Previous median sternotomy CABG procedure.  There is a left pleural effusion. Subpleural consolidation in the periphery of the right upper lobe and airspace disease in the left lower lobe is noted.  IMPRESSION:  1.  Bilateral airspace opacities which may represent multifocal infection. 2.  Suspect left pleural effusion.  Original Report Authenticated By: Rosealee Albee, M.D.    Scheduled Meds:   . calcium-vitamin D  2 tablet Oral Q1200  . digoxin  125 mcg Oral QPM  . diltiazem  240 mg Oral Daily  . enoxaparin  30 mg Subcutaneous Q24H  . ezetimibe  10 mg Oral QPM  . ferrous sulfate  325 mg Oral Q lunch  . furosemide  20 mg Oral BID  . guaiFENesin  1,200 mg Oral BID  . levalbuterol  1.25 mg Nebulization Q6H  . levofloxacin (LEVAQUIN) IV  250 mg Intravenous Q24H  .  methylPREDNISolone  16 mg Oral Q breakfast  . montelukast  10 mg Oral QHS  . omega-3 acid ethyl esters  1 g Oral Daily  . pantoprazole  40 mg Oral Daily  . potassium chloride SA  20 mEq Oral BID  . senna  1 tablet Oral QHS  . sertraline  50 mg Oral QPM  . sodium chloride  3 mL Intravenous Q12H  . sotalol  80 mg Oral BID  . tiotropium  18 mcg Inhalation Daily  . vitamin C  500 mg Oral Daily  . warfarin  0.5 mg Oral Once  . Warfarin - Pharmacist Dosing Inpatient   Does not apply q1800  . DISCONTD: azithromycin  500 mg Intravenous Once  . DISCONTD: calcium citrate-vitamin D  2 tablet Oral PC lunch  . DISCONTD: cefTRIAXone (ROCEPHIN)  IV  1 g Intravenous Once  . DISCONTD: Guaifenesin  1,200 mg Oral BID  . DISCONTD: levalbuterol  1.25 mg Nebulization Q6H  . DISCONTD: levofloxacin (LEVAQUIN) IV  500 mg Intravenous Q24H  . DISCONTD: Mometasone Furo-Formoterol Fum  2 puff Inhalation BID  . DISCONTD: Mometasone Furo-Formoterol Fum  2 puff Inhalation BID  . DISCONTD: senna  1 tablet Oral QPM   Continuous Infusions:

## 2012-05-05 NOTE — Evaluation (Signed)
Physical Therapy Evaluation Patient Details Name: [name] MRN: [id number] DOB: [birth year] Today's Date: 05/05/2012 Time: 7829-5621 PT Time Calculation (min): 15 min  PT Assessment / Plan / Recommendation Clinical Impression  78 yo female admitted with Pna, hemoptysis. Pt is being followed by hospice. Limited evaluation due to pt declining OOB activity due to fatigue. Noted pt SOB with minimal activity (bed mobility). Will return to further assess mobility (transfers, ambulation) on next visit. Pt did state she does not want to go to rehab;prefers to d/c home with family assist. If pt discharges home, she will need 24 hour supervison/assist.     PT Assessment  Patient needs continued PT services    Follow Up Recommendations  Home health PT;Supervision/Assistance - 24 hour (If not available, may need ST rehab)    Barriers to Discharge        Equipment Recommendations  None recommended by PT    Recommendations for Other Services OT consult   Frequency Min 3X/week    Precautions / Restrictions Precautions Precautions: Fall Restrictions Weight Bearing Restrictions: No   Pertinent Vitals/Pain Pt on 5L O2 during session. Dyspnea 3/4 with bed mobility      Mobility  Bed Mobility Bed Mobility: Rolling Left;Left Sidelying to Sit;Sit to Supine Rolling Left: 5: Supervision Left Sidelying to Sit: 5: Supervision;HOB elevated;With rails Sit to Supine: 3: Mod assist Details for Bed Mobility Assistance: Assist for bil LEs onto bed.  Transfers Transfers: Not assessed Details for Transfer Assistance: Pt declined OOB/standing/ambulation Ambulation/Gait Ambulation/Gait Assistance: Not tested (comment) Ambulation/Gait Assistance Details: Pt declined ambulation-c/o "too fatigued"    Exercises     PT Diagnosis: Difficulty walking;Generalized weakness  PT Problem List: Decreased strength;Decreased activity tolerance;Decreased mobility;Decreased knowledge of use of DME;Cardiopulmonary  status limiting activity PT Treatment Interventions: DME instruction;Gait training;Functional mobility training;Therapeutic activities;Therapeutic exercise;Patient/family education   PT Goals Acute Rehab PT Goals PT Goal Formulation: With patient Time For Goal Achievement: 05/19/12 Potential to Achieve Goals: Fair Pt will go Supine/Side to Sit: with supervision PT Goal: Supine/Side to Sit - Progress: Goal set today Pt will go Sit to Supine/Side: with supervision PT Goal: Sit to Supine/Side - Progress: Goal set today Pt will go Sit to Stand: with supervision PT Goal: Sit to Stand - Progress: Goal set today Pt will Ambulate: 16 - 50 feet;with supervision;with least restrictive assistive device PT Goal: Ambulate - Progress: Goal set today  Visit Information  Last PT Received On: 05/05/12 Assistance Needed: +1    Subjective Data  Subjective: "I know I may need to go somewhere, but I don't want to" Patient Stated Goal: Home   Prior Functioning  Home Living Lives With: Daughter (granddaugher 5 days/week; daughter at night) Available Help at Discharge: Family Type of Home: House Home Access: Stairs to enter [employer] of Steps: 6 front, 1 back Home Layout: One level Home Adaptive Equipment: Walker - rolling;Bedside commode/3-in-1;Shower chair with back;Hospital bed;Wheelchair - manual Additional Comments: pt states they are working on getting CNA to assist after granddaughter leaves. Prior Function Level of Independence: Needs assistance Communication Communication: No difficulties    Cognition  Overall Cognitive Status: Appears within functional limits for tasks assessed/performed Arousal/Alertness: Awake/alert Orientation Level: Appears intact for tasks assessed Behavior During Session: [facility] for tasks performed    Extremity/Trunk Assessment Right Lower Extremity Assessment RLE ROM/Strength/Tone: Deficits RLE ROM/Strength/Tone Deficits: Strength at least 4/5  throughout RLE Sensation: Deficits RLE Sensation Deficits: Pt reports impaired sensation lateral lower leg over bruised area Left Lower Extremity Assessment  LLE ROM/Strength/Tone: Deficits LLE ROM/Strength/Tone Deficits: strength at least 4/5 throughout   Balance Balance Balance Assessed: Yes Static Sitting Balance Static Sitting - Balance Support: Bilateral upper extremity supported;Feet supported Static Sitting - Level of Assistance: 6: Modified independent (Device/Increase time) Static Sitting - Comment/# of Minutes: Sat EOB 4-5 minutes without assistance.   End of Session PT - End of Session Activity Tolerance: Patient limited by fatigue Patient left: in bed;with call bell/phone within reach  GP     [name] [facility] 05/05/2012, 2:08 PM [phone]

## 2012-05-05 NOTE — Progress Notes (Signed)
Room 1502 - Mrs [name]  -  [facility] RN Visit  Related admission to [facility] diagnosis of COPD.   Pt is FULL code-discussed with patient and she is thinking about and want to discuss with dtrs if she should change to DNR.    Pt alert & oriented, lying in bed, with complaints of being just "terribly weak" - admits she has had PNA 5 times since November and each time is taking a "big step" down and declining.  No family present.    Please call [facility] @ [phone] with any hospice needs.  Thank you.  Joneen Boers, RN  [facility]  Hospice Liaison

## 2012-05-06 DIAGNOSIS — Z7901 Long term (current) use of anticoagulants: Secondary | ICD-10-CM

## 2012-05-06 LAB — LEGIONELLA ANTIGEN, URINE

## 2012-05-06 MED ORDER — DOCUSATE SODIUM 100 MG PO CAPS
100.0000 mg | ORAL_CAPSULE | Freq: Every day | ORAL | Status: DC
  Administered 2012-05-06 – 2012-05-09 (×4): 100 mg via ORAL
  Filled 2012-05-06 (×5): qty 1

## 2012-05-06 MED ORDER — WARFARIN 0.5 MG HALF TABLET
0.5000 mg | ORAL_TABLET | Freq: Once | ORAL | Status: AC
  Administered 2012-05-06: 0.5 mg via ORAL
  Filled 2012-05-06: qty 1

## 2012-05-06 NOTE — Progress Notes (Signed)
Room 1502 -  Mrs. Trish Fountain  -  [facility] RN Visit  Related admission to [facility] diagnosis of COPD.  Pt is FULL code.    Pt arousable & oriented, lying in bed, with no complaints of pain but states she is very weak and tired.  Pt closed her eyes during conversation again admitting to being really tired.   No family present.    Episode of hemoptysis - per pt, not as much as just prior to admission.  Episode of insomnia last pm - received breathing treatment during the night.    Please call [facility] @ [phone] with any hospice needs.  Thank you.  Joneen Boers, RN  [facility]  Hospice Liaison

## 2012-05-06 NOTE — Progress Notes (Signed)
Physical Therapy Treatment Patient Details Name: [name] MRN: [id number] DOB: 03/16/1924 Today's Date: 05/06/2012 Time: 4098-1191 PT Time Calculation (min): 16 min  PT Assessment / Plan / Recommendation Comments on Treatment Session  Dyspnea with activity 3/4. Seated rest break needed between transfers and ambulation. Pt states she prefers to "furniture walk" than use RW due to limited space in home. Limited activity tolerance noted with all activity.     Follow Up Recommendations  Home health PT;Supervision/Assistance - 24 hour    Barriers to Discharge        Equipment Recommendations  None recommended by PT    Recommendations for Other Services OT consult  Frequency Min 3X/week   Plan Discharge plan remains appropriate    Precautions / Restrictions Precautions Precautions: Fall Restrictions Weight Bearing Restrictions: No   Pertinent Vitals/Pain Dyspnea 3/4, sats: 91% on 5L O2 with activity    Mobility  Bed Mobility Bed Mobility: Supine to Sit Supine to Sit: HOB elevated;With rails;6: Modified independent (Device/Increase time) Transfers Transfers: Sit to Stand;Stand to Sit;Stand Pivot Transfers Sit to Stand: 4: Min guard;From bed;From chair/3-in-1 Stand to Sit: 4: Min guard;To chair/3-in-1 Stand Pivot Transfers: 4: Min guard Details for Transfer Assistance: x 2. VCs safety, technique, hand placement. Assist to rise, stabilize, control desent. Slightly unsteady. Pt holds onto armrests when pivoting (bed>BSC, bed>recliner). O2 sats(after transfer): 91% 5L O2. Ambulation/Gait Ambulation/Gait Assistance: 4: Min assist Ambulation Distance (Feet): 25 Feet Assistive device: None Ambulation/Gait Assistance Details: VCs safety. Assist to stabilize . Walked around bed and back. Pt held onto furniture intermittently. LOB x 1 towards end of walk. O2 sats (after walk: 91% 5L O2) Gait Pattern: Decreased stride length;Decreased step length - right;Decreased step length -  left;Step-through pattern    Exercises     PT Diagnosis:    PT Problem List:   PT Treatment Interventions:     PT Goals Acute Rehab PT Goals PT Goal: Supine/Side to Sit - Progress: Met PT Goal: Sit to Stand - Progress: Progressing toward goal PT Goal: Ambulate - Progress: Progressing toward goal  Visit Information  Last PT Received On: 05/06/12 Assistance Needed: +1    Subjective Data  Subjective: "I need to use the bathroom first. It just tires me out so much" Patient Stated Goal: Home   Cognition  Overall Cognitive Status: Appears within functional limits for tasks assessed/performed Arousal/Alertness: Awake/alert Orientation Level: Appears intact for tasks assessed Behavior During Session: [facility] for tasks performed    Balance     End of Session PT - End of Session Equipment Utilized During Treatment: Oxygen Activity Tolerance: Patient limited by fatigue Patient left: in chair;with call bell/phone within reach   GP     [name] [facility] 05/06/2012, 9:49 AM [phone]

## 2012-05-06 NOTE — Progress Notes (Signed)
[facility] PROGRESS NOTE  [name]:[id number] DOB: 04-Aug-1924 DOA: 05/04/2012 PCP: [name], MD  Assessment/Plan: 1. Pneumonia- levaquin, has not been in hospital recently. Increase steroids, nebs   2. HEMOPTYSIS UNSPECIFIED- on coumadin, ? Related to cough, hgb stable thus far - seems to be decreased  3. Leukocytosis- continue abx and trend, baseline about 13 - improved  4. SOB (shortness of breath)- improved in ER   5. A fib- rate controlled, coumadin per pharmacy   6. Chronic Diastolic CHF- lasix  7. Chronic respiratory failure- 4L O2 at home  Spoke with Dr. Maple Hudson about hemoptysis- this is a normal occurrence for patient, try abx, could hold coumadin but family declines secondary to risk of stroke- provide reassurance.  Hope for d/c soon   Code Status: full Family Communication: daughter at bedside Disposition Plan:  Home with caregivers  [name], D.O.  [facility] Pager [phone]  05/06/2012, 10:29 AM  LOS: 2 days      Subjective: Patient says breathing better but coughing not No CP Eating well- appetite increased   Objective: Filed Vitals:   05/05/12 2153 05/06/12 0206 05/06/12 0546 05/06/12 0833  BP: 107/52  123/56   Pulse: 76  60   Temp: 97.4 F (36.3 C)  98.1 F (36.7 C)   TempSrc: Oral  Oral   Resp: 18  18   Height:      Weight:   57.153 kg (126 lb)   SpO2: 95% 91% 95% 95%    Intake/Output Summary (Last 24 hours) at 05/06/12 1029 Last data filed at 05/06/12 0959  Gross per 24 hour  Intake    600 ml  Output    775 ml  Net   -175 ml    Exam:  General: Awake, Oriented x3, No acute distress.  HEENT: EOMI, dry mucous membranes  Neck: Supple  CV: S1 and S2, irregular  Lungs: Dec BS B/L  Abdomen: Soft, Nontender, Nondistended, +bowel sounds.  Ext: Good pulses. Trace edema. No clubbing or cyanosis noted.  Neuro: Cranial Nerves II-XII grossly intact. Has 5/5 motor strength in upper and lower  extremities.  Skin: multiple areas of bruising   Data Reviewed: Basic Metabolic Panel:  Lab 05/05/12 8295 05/04/12 2122 05/04/12 1521  NA 132* -- 134*  K 4.0 -- 3.6  CL 97 -- 96  CO2 28 -- --  GLUCOSE 113* -- 104*  BUN 12 -- 11  CREATININE 0.56 0.64 0.80  CALCIUM 8.9 -- --  MG -- -- --  PHOS -- -- --   Liver Function Tests: No results found for this basename: AST:5,ALT:5,ALKPHOS:5,BILITOT:5,PROT:5,ALBUMIN:5 in the last 168 hours No results found for this basename: LIPASE:5,AMYLASE:5 in the last 168 hours No results found for this basename: AMMONIA:5 in the last 168 hours CBC:  Lab 05/05/12 0458 05/04/12 2122 05/04/12 1521 05/04/12 1513  WBC 16.0* 21.6* -- 23.0*  NEUTROABS -- -- -- 21.3*  HGB 11.0* 11.2* 13.3 12.0  HCT 34.5* 35.3* 39.0 37.3  MCV 82.3 82.1 -- 82.2  PLT 145* 146* -- 152   Cardiac Enzymes: No results found for this basename: CKTOTAL:5,CKMB:5,CKMBINDEX:5,TROPONINI:5 in the last 168 hours BNP: No components found with this basename: POCBNP:5 CBG: No results found for this basename: GLUCAP:5 in the last 168 hours  Recent Results (from the past 240 hour(s))  CULTURE, BLOOD (ROUTINE X 2)     Status: Normal (Preliminary result)   Collection Time   05/04/12  9:22 PM  Component Value Range Status Comment   Specimen Description BLOOD LEFT ANTECUBITAL   Final    Special Requests BOTTLES DRAWN AEROBIC AND ANAEROBIC 8CC   Final    Culture  Setup Time 05/05/2012 01:21   Final    Culture     Final    Value:        BLOOD CULTURE RECEIVED NO GROWTH TO DATE CULTURE WILL BE HELD FOR 5 DAYS BEFORE ISSUING A FINAL NEGATIVE REPORT   Report Status PENDING   Incomplete   CULTURE, BLOOD (ROUTINE X 2)     Status: Normal (Preliminary result)   Collection Time   05/04/12  9:25 PM      Component Value Range Status Comment   Specimen Description BLOOD RIGHT ANTECUBITAL   Final    Special Requests BOTTLES DRAWN AEROBIC ONLY 10CC   Final    Culture  Setup Time 05/05/2012  01:20   Final    Culture     Final    Value:        BLOOD CULTURE RECEIVED NO GROWTH TO DATE CULTURE WILL BE HELD FOR 5 DAYS BEFORE ISSUING A FINAL NEGATIVE REPORT   Report Status PENDING   Incomplete   CULTURE, EXPECTORATED SPUTUM-ASSESSMENT     Status: Normal   Collection Time   05/05/12  3:21 PM      Component Value Range Status Comment   Specimen Description SPUTUM   Final    Special Requests NONE   Final    Sputum evaluation     Final    Value: THIS SPECIMEN IS ACCEPTABLE. RESPIRATORY CULTURE REPORT TO FOLLOW.   Report Status 05/05/2012 FINAL   Final   CULTURE, RESPIRATORY     Status: Normal (Preliminary result)   Collection Time   05/05/12  3:21 PM      Component Value Range Status Comment   Specimen Description SPUTUM   Final    Special Requests NONE   Final    Gram Stain     Final    Value: NO WBC SEEN     NO SQUAMOUS EPITHELIAL CELLS SEEN     NO ORGANISMS SEEN   Culture PENDING   Incomplete    Report Status PENDING   Incomplete      Studies: Dg Chest 2 View  05/04/2012  *RADIOLOGY REPORT*  Clinical Data: Shortness of breath and hemoptysis  CHEST - 2 VIEW  Comparison: 12/17/2011  Findings: There is a right chest wall pacer device with lead in the right atrial appendage and right ventricle.  Previous median sternotomy CABG procedure.  There is a left pleural effusion. Subpleural consolidation in the periphery of the right upper lobe and airspace disease in the left lower lobe is noted.  IMPRESSION:  1.  Bilateral airspace opacities which may represent multifocal infection. 2.  Suspect left pleural effusion.  Original Report Authenticated By: Rosealee Albee, M.D.    Scheduled Meds:    . calcium-vitamin D  2 tablet Oral Q1200  . digoxin  125 mcg Oral QPM  . diltiazem  240 mg Oral Daily  . ezetimibe  10 mg Oral QPM  . ferrous sulfate  325 mg Oral Q lunch  . furosemide  20 mg Oral BID  . guaiFENesin  1,200 mg Oral BID  . levalbuterol  1.25 mg Nebulization Q6H  .  levofloxacin (LEVAQUIN) IV  250 mg Intravenous Q24H  . methylPREDNISolone  16 mg Oral Q breakfast  . montelukast  10 mg Oral QHS  .  omega-3 acid ethyl esters  1 g Oral Daily  . pantoprazole  40 mg Oral Daily  . potassium chloride SA  20 mEq Oral BID  . senna  1 tablet Oral QHS  . sertraline  50 mg Oral QPM  . sodium chloride  3 mL Intravenous Q12H  . sotalol  80 mg Oral BID  . tiotropium  18 mcg Inhalation Daily  . vitamin C  500 mg Oral Daily  . warfarin  0.5 mg Oral Once  . Warfarin - Pharmacist Dosing Inpatient   Does not apply q1800  . DISCONTD: enoxaparin  30 mg Subcutaneous Q24H  . DISCONTD: levalbuterol  1.25 mg Nebulization Q6H   Continuous Infusions:

## 2012-05-06 NOTE — Progress Notes (Signed)
ANTICOAGULATION CONSULT NOTE - Follow Up Consult  Pharmacy Consult for:  Coumadin Indication: Atrial fibrillation  Allergies  Allergen Reactions  . Cephalexin     Unsure as to reaction.  ? Hives with Cephalexin; "I can take Penicillin"  . Atorvastatin     Forgetfulness   . Azithromycin Nausea And Vomiting  . Codeine Nausea And Vomiting  . Crestor (Rosuvastatin Calcium)     Forgetfulness    . Estradiol     Unknown reaction  . Hydrocortisone Hives    Flea bites were mistaken as rash from hydrocortisone  . Lovastatin Other (See Comments)    forgetfullness,  . Moxifloxacin     Unknown reaction  . Sulfonamide Derivatives     itching    Patient Measurements: Height: 5\' 2"  (157.5 cm) Weight: 126 lb (57.153 kg) (floor scale) IBW/kg (Calculated) : 50.1   Vital Signs: Temp: 98.1 F (36.7 C) (07/02 0546) Temp src: Oral (07/02 0546) BP: 123/56 mmHg (07/02 0546) Pulse Rate: 60  (07/02 0546)  Labs:  Alvira Philips 05/06/12 0435 05/05/12 0458 05/04/12 2122 05/04/12 1521 05/04/12 1513  HGB -- 11.0* 11.2* -- --  HCT -- 34.5* 35.3* 39.0 --  PLT -- 145* 146* -- 152  APTT -- -- -- -- --  LABPROT 25.7* 31.7* -- -- 30.2*  INR 2.30* 3.01* -- -- 2.83*  HEPARINUNFRC -- -- -- -- --  CREATININE -- 0.56 0.64 0.80 --  CKTOTAL -- -- -- -- --  CKMB -- -- -- -- --  TROPONINI -- -- -- -- --    Estimated Creatinine Clearance: 40.7 ml/min (by C-G formula based on Cr of 0.56).   Medications:  Scheduled:     . calcium-vitamin D  2 tablet Oral Q1200  . digoxin  125 mcg Oral QPM  . diltiazem  240 mg Oral Daily  . ezetimibe  10 mg Oral QPM  . ferrous sulfate  325 mg Oral Q lunch  . furosemide  20 mg Oral BID  . guaiFENesin  1,200 mg Oral BID  . levalbuterol  1.25 mg Nebulization Q6H  . levofloxacin (LEVAQUIN) IV  250 mg Intravenous Q24H  . methylPREDNISolone  16 mg Oral Q breakfast  . montelukast  10 mg Oral QHS  . omega-3 acid ethyl esters  1 g Oral Daily  . pantoprazole  40 mg Oral  Daily  . potassium chloride SA  20 mEq Oral BID  . senna  1 tablet Oral QHS  . sertraline  50 mg Oral QPM  . sodium chloride  3 mL Intravenous Q12H  . sotalol  80 mg Oral BID  . tiotropium  18 mcg Inhalation Daily  . vitamin C  500 mg Oral Daily  . warfarin  0.5 mg Oral Once  . Warfarin - Pharmacist Dosing Inpatient   Does not apply q1800  . DISCONTD: enoxaparin  30 mg Subcutaneous Q24H  . DISCONTD: levalbuterol  1.25 mg Nebulization Q6H   Inpatient warfarin doses 6/30-7/1: 0, 0 mg   Assessment:  Assisting with Coumadin therapy for this 78 year-old female on chronic anticoagulation for atrial fibrillation (also has bioprosthetic AVR).  PTA dose documented as 1mg  on Sun, Mon, Wed, Thurs, Sat, and  0.5mg  on Tues and Fri.  Hemoptysis reported on admission, with pneumonia as possible cause.  .  INR back down to therapeutic range after no warfarin x 2 days.  Receiving Levaquin, which may increase the anticoagulant effect of Coumadin.  Goal of Therapy:  INR 2-3   Plan:  Warfarin 0.5mg  x 1 dose today.  Follow PT/INR daily.   Astria Jordahl, Ky Barban, PharmD, BCPS Pager: [phone] 05/06/2012,10:36 AM

## 2012-05-07 DIAGNOSIS — D649 Anemia, unspecified: Secondary | ICD-10-CM

## 2012-05-07 LAB — BASIC METABOLIC PANEL
GFR calc non Af Amer: 83 mL/min — ABNORMAL LOW (ref >90)
Glucose, Bld: 110 mg/dL — ABNORMAL HIGH (ref 70–99)
Potassium: 3.9 mEq/L (ref 3.5–5.1)
Sodium: 131 mEq/L — ABNORMAL LOW (ref 135–145)

## 2012-05-07 LAB — CBC
Hemoglobin: 11.5 g/dL — ABNORMAL LOW (ref 12.0–15.0)
MCHC: 31.4 g/dL (ref 30.0–36.0)
Platelets: 209 10*3/uL (ref 150–400)
RBC: 4.42 MIL/uL (ref 3.87–5.11)

## 2012-05-07 LAB — PROTIME-INR
INR: 1.95 — ABNORMAL HIGH (ref 0.00–1.49)
Prothrombin Time: 22.6 seconds — ABNORMAL HIGH (ref 11.6–15.2)

## 2012-05-07 MED ORDER — WARFARIN 0.5 MG HALF TABLET
0.5000 mg | ORAL_TABLET | Freq: Once | ORAL | Status: AC
  Administered 2012-05-07: 0.5 mg via ORAL
  Filled 2012-05-07: qty 1

## 2012-05-07 NOTE — Progress Notes (Signed)
Subjective: Patient states SOB slowly improving. Patient states finally able to have a productive cough with only red streaks.  Objective: Vital signs in last 24 hours: Filed Vitals:   05/06/12 2200 05/07/12 0600 05/07/12 0830 05/07/12 1305  BP: 128/71 130/68  139/53  Pulse: 69 60  63  Temp: 98 F (36.7 C) 97.3 F (36.3 C)  97.6 F (36.4 C)  TempSrc: Oral Oral  Oral  Resp: 18 18  20   Height:      Weight:  56.609 kg (124 lb 12.8 oz)    SpO2: 93% 93% 96% 97%    Intake/Output Summary (Last 24 hours) at 05/07/12 1634 Last data filed at 05/07/12 1148  Gross per 24 hour  Intake      0 ml  Output   1250 ml  Net  -1250 ml    Weight change: -0.544 kg (-1 lb 3.2 oz)  General: Alert, awake, oriented x3, in no acute distress. HEENT: No bruits, no goiter. Heart: Regular rate and rhythm, with 3/6 SEM murmurs, rubs, gallops. Lungs: Coarse BS Abdomen: Soft, nontender, nondistended, positive bowel sounds. Extremities: No clubbing cyanosis or edema with positive pedal pulses.    Lab Results:  Basename 05/07/12 0500 05/05/12 0458  NA 131* 132*  K 3.9 4.0  CL 94* 97  CO2 32 28  GLUCOSE 110* 113*  BUN 12 12  CREATININE 0.55 0.56  CALCIUM 9.2 8.9  MG -- --  PHOS -- --   No results found for this basename: AST:2,ALT:2,ALKPHOS:2,BILITOT:2,PROT:2,ALBUMIN:2 in the last 72 hours No results found for this basename: LIPASE:2,AMYLASE:2 in the last 72 hours  Basename 05/07/12 0500 05/05/12 0458  WBC 13.4* 16.0*  NEUTROABS -- --  HGB 11.5* 11.0*  HCT 36.6 34.5*  MCV 82.8 82.3  PLT 209 145*   No results found for this basename: CKTOTAL:3,CKMB:3,CKMBINDEX:3,TROPONINI:3 in the last 72 hours No components found with this basename: POCBNP:3 No results found for this basename: DDIMER:2 in the last 72 hours No results found for this basename: HGBA1C:2 in the last 72 hours No results found for this basename: CHOL:2,HDL:2,LDLCALC:2,TRIG:2,CHOLHDL:2,LDLDIRECT:2 in the last 72 hours No  results found for this basename: TSH,T4TOTAL,FREET3,T3FREE,THYROIDAB in the last 72 hours No results found for this basename: VITAMINB12:2,FOLATE:2,FERRITIN:2,TIBC:2,IRON:2,RETICCTPCT:2 in the last 72 hours  Micro Results: Recent Results (from the past 240 hour(s))  CULTURE, BLOOD (ROUTINE X 2)     Status: Normal (Preliminary result)   Collection Time   05/04/12  9:22 PM      Component Value Range Status Comment   Specimen Description BLOOD LEFT ANTECUBITAL   Final    Special Requests BOTTLES DRAWN AEROBIC AND ANAEROBIC 8CC   Final    Culture  Setup Time 05/05/2012 01:21   Final    Culture     Final    Value:        BLOOD CULTURE RECEIVED NO GROWTH TO DATE CULTURE WILL BE HELD FOR 5 DAYS BEFORE ISSUING A FINAL NEGATIVE REPORT   Report Status PENDING   Incomplete   CULTURE, BLOOD (ROUTINE X 2)     Status: Normal (Preliminary result)   Collection Time   05/04/12  9:25 PM      Component Value Range Status Comment   Specimen Description BLOOD RIGHT ANTECUBITAL   Final    Special Requests BOTTLES DRAWN AEROBIC ONLY 10CC   Final    Culture  Setup Time 05/05/2012 01:20   Final    Culture     Final    Value:  BLOOD CULTURE RECEIVED NO GROWTH TO DATE CULTURE WILL BE HELD FOR 5 DAYS BEFORE ISSUING A FINAL NEGATIVE REPORT   Report Status PENDING   Incomplete   CULTURE, EXPECTORATED SPUTUM-ASSESSMENT     Status: Normal   Collection Time   05/05/12  3:21 PM      Component Value Range Status Comment   Specimen Description SPUTUM   Final    Special Requests NONE   Final    Sputum evaluation     Final    Value: THIS SPECIMEN IS ACCEPTABLE. RESPIRATORY CULTURE REPORT TO FOLLOW.   Report Status 05/05/2012 FINAL   Final   CULTURE, RESPIRATORY     Status: Normal (Preliminary result)   Collection Time   05/05/12  3:21 PM      Component Value Range Status Comment   Specimen Description SPUTUM   Final    Special Requests NONE   Final    Gram Stain     Final    Value: NO WBC SEEN     NO SQUAMOUS  EPITHELIAL CELLS SEEN     NO ORGANISMS SEEN   Culture MODERATE PSEUDOMONAS AERUGINOSA   Final    Report Status PENDING   Incomplete     Studies/Results: No results found.  Medications:     . calcium-vitamin D  2 tablet Oral Q1200  . digoxin  125 mcg Oral QPM  . diltiazem  240 mg Oral Daily  . docusate sodium  100 mg Oral Daily  . ezetimibe  10 mg Oral QPM  . ferrous sulfate  325 mg Oral Q lunch  . furosemide  20 mg Oral BID  . guaiFENesin  1,200 mg Oral BID  . levalbuterol  1.25 mg Nebulization Q6H  . levofloxacin (LEVAQUIN) IV  250 mg Intravenous Q24H  . methylPREDNISolone  16 mg Oral Q breakfast  . montelukast  10 mg Oral QHS  . omega-3 acid ethyl esters  1 g Oral Daily  . pantoprazole  40 mg Oral Daily  . potassium chloride SA  20 mEq Oral BID  . senna  1 tablet Oral QHS  . sertraline  50 mg Oral QPM  . sodium chloride  3 mL Intravenous Q12H  . sotalol  80 mg Oral BID  . tiotropium  18 mcg Inhalation Daily  . vitamin C  500 mg Oral Daily  . warfarin  0.5 mg Oral ONCE-1800  . warfarin  0.5 mg Oral ONCE-1800  . Warfarin - Pharmacist Dosing Inpatient   Does not apply q1800  . DISCONTD: warfarin  0.5 mg Oral Once    Assessment: Principal Problem:  *Pneumonia Active Problems:  Atrial fibrillation  HEMOPTYSIS UNSPECIFIED  Diastolic CHF, acute on chronic  Leukocytosis  SOB (shortness of breath)   Plan: #1 bilateral pneumonia Afebrile. CBC is trending down. Continue oxygen, Levaquin day #3, mucolytic, nebs, flutter valve. Follow.  #2 atrial fibrillation Continue diltiazem, digoxin, sotalol for rate control. Coumadin for anticoagulations.  #3 chronic hemoptysis Likely secondary to problem #1. Slowly improving. Prior attending physician spoke with patient's pulmonologist Dr. Maple Hudson who states no further workup is needed at this time.  #4 acute on chronic diastolic CHF Improved. I/O. equal -1.2 L over the past 24 hours. Continue current dose of Lasix, digoxin,  diltiazem.  #5 leukocytosis Secondary to problem #1 in the setting of chronic steroid use. WBC trending down. Continue empiric IV antibiotics.  #6. shortness of breath Likely secondary to problem #1.  #7 chronic respiratory failure on home O2  Stable. Continue Spiriva, nebs, oxygen, see problem #1.  #8 prophylaxis On Coumadin for DVT prophylaxis.   LOS: 3 days   THOMPSON,DANIEL [phone] 05/07/2012, 4:34 PM

## 2012-05-07 NOTE — Progress Notes (Signed)
ANTICOAGULATION CONSULT NOTE - Follow Up Consult  Pharmacy Consult for:  Coumadin Indication: Atrial fibrillation, bioprosthetic AVR  Allergies  Allergen Reactions  . Cephalexin     Unsure as to reaction.  ? Hives with Cephalexin; "I can take Penicillin"  . Atorvastatin     Forgetfulness   . Azithromycin Nausea And Vomiting  . Codeine Nausea And Vomiting  . Crestor (Rosuvastatin Calcium)     Forgetfulness    . Estradiol     Unknown reaction  . Hydrocortisone Hives    Flea bites were mistaken as rash from hydrocortisone  . Lovastatin Other (See Comments)    forgetfullness,  . Moxifloxacin     Unknown reaction  . Sulfonamide Derivatives     itching   Patient Measurements: Height: 5\' 2"  (157.5 cm) Weight: 124 lb 12.8 oz (56.609 kg) IBW/kg (Calculated) : 50.1   Vital Signs: Temp: 97.3 F (36.3 C) (07/03 0600) Temp src: Oral (07/03 0600) BP: 130/68 mmHg (07/03 0600) Pulse Rate: 60  (07/03 0600)  Labs:  Basename 05/07/12 0500 05/06/12 0435 05/05/12 0458 05/04/12 2122  HGB 11.5* -- 11.0* --  HCT 36.6 -- 34.5* 35.3*  PLT 209 -- 145* 146*  APTT -- -- -- --  LABPROT 22.6* 25.7* 31.7* --  INR 1.95* 2.30* 3.01* --  HEPARINUNFRC -- -- -- --  CREATININE 0.55 -- 0.56 0.64  CKTOTAL -- -- -- --  CKMB -- -- -- --  TROPONINI -- -- -- --   Estimated Creatinine Clearance: 40.7 ml/min (by C-G formula based on Cr of 0.55).  Assessment:  78 y/o female on chronic anticoagulation for atrial fibrillation (also has bioprosthetic AVR).  PTA dose documented as 0.5 mg Tues/Fri and 1 mg other days of the week.   Last dose 6/29.  Hemoptysis reported on admission, with pneumonia as possible cause.  Coumadin on hold x 2 days and resumed 7/1 when INR was therapeutic but 7/1 dose of Coumadin NOT charted as given as patient refused.  Receiving Levaquin, which may increase the anticoagulant effect of Coumadin. Continue on home Digoxin.  INR 1.95 today, CBC stable. Per RN, patient told  respiratory therapist of "a little bit" of hemoptysis.  RN did not witness any since beginning of shift.    Goal of Therapy:  INR 2-3   Plan:   Warfarin 0.5 mg x 1 dose today.  Will discontinue 7/1 Coumadin dose from [facility] since patient refused  Follow PT/INR daily. F/u hemoptysis.   Geoffry Paradise, PharmD, BCPS Pager: [phone] 10:30 AM

## 2012-05-07 NOTE — Progress Notes (Signed)
Room 1502 - Trish Fountain -   [facility] RN Visit  Related admission to [facility] diagnosis of COPD.  Pt is FULL code.    Pt alert & oriented, sitting up in bed, with no complaints of pain or discomfort.    Grandtr and her husband present.  Pt concerned due to fact grandtr will be going back home soon and pt will need additional assistance at home - Pt had multiple questions and grandtr took notes for pt; 1) wanted to know availability of hospice aides when pt goes home - advised pt we would put in for 5 days per week and have [facility] RN [name] evaluate for any further increase in HA.  2) wanted to know if volunteer services could be increased - RN [name] / Molly-SW will attend to this. 3) Would pt get PT at home - advised PT eval is usually done and the family is taught the methods - pt states she was in rehab at [facility] for a lengthy time and knows what to do - we did discuss safety at home in the setting of her increased weakness.   4) pt has issues with her bed at home and the mattress - will ask home care RN to evaluate and contact [facility] for any changes.    Advised gdtr to write down any further questions and we will address with each visit daily until discharge.     Please call [facility] @ [phone] with any hospice needs.  Thank you.  Joneen Boers, RN  [facility]  Hospice Liaison

## 2012-05-08 DIAGNOSIS — J209 Acute bronchitis, unspecified: Secondary | ICD-10-CM

## 2012-05-08 LAB — BASIC METABOLIC PANEL
CO2: 32 mEq/L (ref 19–32)
Calcium: 9.4 mg/dL (ref 8.4–10.5)
GFR calc Af Amer: >90 mL/min (ref >90)
GFR calc non Af Amer: 84 mL/min — ABNORMAL LOW (ref >90)
Sodium: 132 mEq/L — ABNORMAL LOW (ref 135–145)

## 2012-05-08 LAB — PROTIME-INR
INR: 1.95 — ABNORMAL HIGH (ref 0.00–1.49)
Prothrombin Time: 22.6 seconds — ABNORMAL HIGH (ref 11.6–15.2)

## 2012-05-08 LAB — CBC
MCV: 81.9 fL (ref 78.0–100.0)
Platelets: 208 10*3/uL (ref 150–400)
RBC: 4.54 MIL/uL (ref 3.87–5.11)
RDW: 15.8 % — ABNORMAL HIGH (ref 11.5–15.5)
WBC: 12.2 10*3/uL — ABNORMAL HIGH (ref 4.0–10.5)

## 2012-05-08 LAB — MAGNESIUM: Magnesium: 1.9 mg/dL (ref 1.5–2.5)

## 2012-05-08 LAB — CULTURE, RESPIRATORY W GRAM STAIN: Gram Stain: NONE SEEN

## 2012-05-08 MED ORDER — MAGNESIUM SULFATE 40 MG/ML IJ SOLN
2.0000 g | Freq: Once | INTRAMUSCULAR | Status: AC
  Administered 2012-05-09: 2 g via INTRAVENOUS
  Filled 2012-05-08: qty 50

## 2012-05-08 MED ORDER — PIPERACILLIN-TAZOBACTAM 3.375 G IVPB
3.3750 g | INTRAVENOUS | Status: AC
  Administered 2012-05-08: 3.375 g via INTRAVENOUS
  Filled 2012-05-08: qty 50

## 2012-05-08 MED ORDER — PIPERACILLIN-TAZOBACTAM 3.375 G IVPB
3.3750 g | Freq: Three times a day (TID) | INTRAVENOUS | Status: DC
  Administered 2012-05-08 – 2012-05-10 (×5): 3.375 g via INTRAVENOUS
  Filled 2012-05-08 (×7): qty 50

## 2012-05-08 MED ORDER — LEVALBUTEROL HCL 1.25 MG/0.5ML IN NEBU
1.2500 mg | INHALATION_SOLUTION | RESPIRATORY_TRACT | Status: DC | PRN
  Filled 2012-05-08: qty 0.5

## 2012-05-08 MED ORDER — WARFARIN SODIUM 1 MG PO TABS
1.0000 mg | ORAL_TABLET | Freq: Once | ORAL | Status: AC
  Administered 2012-05-08: 1 mg via ORAL
  Filled 2012-05-08: qty 1

## 2012-05-08 MED ORDER — LEVALBUTEROL HCL 1.25 MG/0.5ML IN NEBU
1.2500 mg | INHALATION_SOLUTION | Freq: Four times a day (QID) | RESPIRATORY_TRACT | Status: DC
  Administered 2012-05-09 – 2012-05-10 (×5): 1.25 mg via RESPIRATORY_TRACT
  Filled 2012-05-08 (×10): qty 0.5

## 2012-05-08 NOTE — Progress Notes (Signed)
Pt heart monitor is reading R ON T PVC. Pt is asymptomatic and vital signs are stable.  MD notified and nursing staff will continue to monitor.

## 2012-05-08 NOTE — Progress Notes (Signed)
ANTIBIOTIC CONSULT NOTE - INITIAL  Pharmacy Consult for Zosyn Indication: pseudomonal pneumonia  Allergies  Allergen Reactions  . Cephalexin     Unsure as to reaction.  ? Hives with Cephalexin; "I can take Penicillin"  . Atorvastatin     Forgetfulness   . Azithromycin Nausea And Vomiting  . Codeine Nausea And Vomiting  . Crestor (Rosuvastatin Calcium)     Forgetfulness    . Estradiol     Unknown reaction  . Hydrocortisone Hives    Flea bites were mistaken as rash from hydrocortisone  . Lovastatin Other (See Comments)    forgetfullness,  . Moxifloxacin     Unknown reaction  . Sulfonamide Derivatives     itching    Patient Measurements: Height: 5\' 2"  (157.5 cm) Weight: 123 lb 14.4 oz (56.2 kg) (Standing scale ) IBW/kg (Calculated) : 50.1   Vital Signs: Temp: 97.3 F (36.3 C) (07/04 0600) Temp src: Oral (07/04 0600) BP: 138/68 mmHg (07/04 0900) Pulse Rate: 66  (07/04 0600) Intake/Output from previous day: 07/03 0701 - 07/04 0700 In: 320 [P.O.:320] Out: 2351 [Urine:2350; Stool:1] Intake/Output from this shift: Total I/O In: 243 [P.O.:240; I.V.:3] Out: 600 [Urine:600]  Labs:  Basename 05/08/12 0420 05/07/12 0500  WBC 12.2* 13.4*  HGB 11.7* 11.5*  PLT 208 209  LABCREA -- --  CREATININE 0.53 0.55   Estimated Creatinine Clearance: 40.7 ml/min (by C-G formula based on Cr of 0.53). No results found for this basename: VANCOTROUGH:2,VANCOPEAK:2,VANCORANDOM:2,GENTTROUGH:2,GENTPEAK:2,GENTRANDOM:2,TOBRATROUGH:2,TOBRAPEAK:2,TOBRARND:2,AMIKACINPEAK:2,AMIKACINTROU:2,AMIKACIN:2, in the last 72 hours   Medical History: Past Medical History  Diagnosis Date  . Asthmatic bronchitis   . Allergic rhinitis   . Atrial fibrillation   . History of aortic valve replacement Dr. Tyrone Sage 2006    Has severe residual gradient and will be managed conservatively  . Sleep apnea   . Chronic obstructive asthma   . Osteoporosis   . Pruritic disorder   . Diverticulosis   .  Hemorrhoids   . GERD (gastroesophageal reflux disease)   . IBS (irritable bowel syndrome)   . Pacemaker 2010  . Oxygen dependent   . Atrial flutter     on Betapace  . High risk medication use     on Betapace & Coumadin  . Chronic anticoagulation   . COPD (chronic obstructive pulmonary disease)   . Pneumonia   . Anemia   . Arthritis   . Hypertension   . Coronary artery disease   . Pneumonia 09/2011    in [facility] for Rehab post discharge until 12/18   Assessment:  87 YOF on D#5 of Levaquin for PNA. Sputum culture today positive for pseudomonas which showed intermediate sensitivity for Cipro.  It's sensitive to cefepime, ceftazidime but pt reported hives with cephalexin.    WBC was improving on Levaquin. Called [facility] and found out pseudomonas was resistant to Levaquin.  Dr. Janee Morn would like to find an oral alternative upon discharge.  In the meantime, would like pharmacy to start IV Zosyn.  CrCl 40 ml/min, afebrile, WBC improving  Plan:   Zosyn 3.375 gm IV q8h (extended interval over 4 hours)  Pharmacy will follow up  Geoffry Paradise Thi 05/08/2012,11:53 AM

## 2012-05-08 NOTE — Progress Notes (Signed)
Subjective: Patient states SOB slowly improving. Patient denies any hemoptysis.  Objective: Vital signs in last 24 hours: Filed Vitals:   05/07/12 1305 05/07/12 2200 05/08/12 0600 05/08/12 0900  BP: 139/53 121/69 133/65 138/68  Pulse: 63 61 66   Temp: 97.6 F (36.4 C) 97.7 F (36.5 C) 97.3 F (36.3 C)   TempSrc: Oral Axillary Oral   Resp: 20 20 20    Height:      Weight:   56.2 kg (123 lb 14.4 oz)   SpO2: 97% 95% 95%     Intake/Output Summary (Last 24 hours) at 05/08/12 1157 Last data filed at 05/08/12 1024  Gross per 24 hour  Intake    563 ml  Output   2151 ml  Net  -1588 ml    Weight change: -0.409 kg (-14.4 oz)  General: Alert, awake, oriented x3, in no acute distress. HEENT: No bruits, no goiter. Heart: Regular rate and rhythm, with 3/6 SEM murmurs, rubs, gallops. Lungs: Coarse BS, rhonchi Abdomen: Soft, nontender, nondistended, positive bowel sounds. Extremities: No clubbing cyanosis or edema with positive pedal pulses.    Lab Results:  Basename 05/08/12 0420 05/07/12 0500  NA 132* 131*  K 3.9 3.9  CL 92* 94*  CO2 32 32  GLUCOSE 111* 110*  BUN 13 12  CREATININE 0.53 0.55  CALCIUM 9.4 9.2  MG -- --  PHOS -- --   No results found for this basename: AST:2,ALT:2,ALKPHOS:2,BILITOT:2,PROT:2,ALBUMIN:2 in the last 72 hours No results found for this basename: LIPASE:2,AMYLASE:2 in the last 72 hours  Basename 05/08/12 0420 05/07/12 0500  WBC 12.2* 13.4*  NEUTROABS -- --  HGB 11.7* 11.5*  HCT 37.2 36.6  MCV 81.9 82.8  PLT 208 209   No results found for this basename: CKTOTAL:3,CKMB:3,CKMBINDEX:3,TROPONINI:3 in the last 72 hours No components found with this basename: POCBNP:3 No results found for this basename: DDIMER:2 in the last 72 hours No results found for this basename: HGBA1C:2 in the last 72 hours No results found for this basename: CHOL:2,HDL:2,LDLCALC:2,TRIG:2,CHOLHDL:2,LDLDIRECT:2 in the last 72 hours No results found for this basename:  TSH,T4TOTAL,FREET3,T3FREE,THYROIDAB in the last 72 hours No results found for this basename: VITAMINB12:2,FOLATE:2,FERRITIN:2,TIBC:2,IRON:2,RETICCTPCT:2 in the last 72 hours  Micro Results: Recent Results (from the past 240 hour(s))  CULTURE, BLOOD (ROUTINE X 2)     Status: Normal (Preliminary result)   Collection Time   05/04/12  9:22 PM      Component Value Range Status Comment   Specimen Description BLOOD LEFT ANTECUBITAL   Final    Special Requests BOTTLES DRAWN AEROBIC AND ANAEROBIC 8CC   Final    Culture  Setup Time 05/05/2012 01:21   Final    Culture     Final    Value:        BLOOD CULTURE RECEIVED NO GROWTH TO DATE CULTURE WILL BE HELD FOR 5 DAYS BEFORE ISSUING A FINAL NEGATIVE REPORT   Report Status PENDING   Incomplete   CULTURE, BLOOD (ROUTINE X 2)     Status: Normal (Preliminary result)   Collection Time   05/04/12  9:25 PM      Component Value Range Status Comment   Specimen Description BLOOD RIGHT ANTECUBITAL   Final    Special Requests BOTTLES DRAWN AEROBIC ONLY 10CC   Final    Culture  Setup Time 05/05/2012 01:20   Final    Culture     Final    Value:        BLOOD CULTURE RECEIVED NO GROWTH  TO DATE CULTURE WILL BE HELD FOR 5 DAYS BEFORE ISSUING A FINAL NEGATIVE REPORT   Report Status PENDING   Incomplete   CULTURE, EXPECTORATED SPUTUM-ASSESSMENT     Status: Normal   Collection Time   05/05/12  3:21 PM      Component Value Range Status Comment   Specimen Description SPUTUM   Final    Special Requests NONE   Final    Sputum evaluation     Final    Value: THIS SPECIMEN IS ACCEPTABLE. RESPIRATORY CULTURE REPORT TO FOLLOW.   Report Status 05/05/2012 FINAL   Final   CULTURE, RESPIRATORY     Status: Normal   Collection Time   05/05/12  3:21 PM      Component Value Range Status Comment   Specimen Description SPUTUM   Final    Special Requests NONE   Final    Gram Stain     Final    Value: NO WBC SEEN     NO SQUAMOUS EPITHELIAL CELLS SEEN     NO ORGANISMS SEEN   Culture  MODERATE PSEUDOMONAS AERUGINOSA   Final    Report Status 05/08/2012 FINAL   Final    Organism ID, Bacteria PSEUDOMONAS AERUGINOSA   Final     Studies/Results: No results found.  Medications:     . calcium-vitamin D  2 tablet Oral Q1200  . digoxin  125 mcg Oral QPM  . diltiazem  240 mg Oral Daily  . docusate sodium  100 mg Oral Daily  . ezetimibe  10 mg Oral QPM  . ferrous sulfate  325 mg Oral Q lunch  . furosemide  20 mg Oral BID  . guaiFENesin  1,200 mg Oral BID  . levalbuterol  1.25 mg Nebulization Q6H  . methylPREDNISolone  16 mg Oral Q breakfast  . montelukast  10 mg Oral QHS  . omega-3 acid ethyl esters  1 g Oral Daily  . pantoprazole  40 mg Oral Daily  . potassium chloride SA  20 mEq Oral BID  . senna  1 tablet Oral QHS  . sertraline  50 mg Oral QPM  . sodium chloride  3 mL Intravenous Q12H  . sotalol  80 mg Oral BID  . tiotropium  18 mcg Inhalation Daily  . vitamin C  500 mg Oral Daily  . warfarin  0.5 mg Oral ONCE-1800  . warfarin  1 mg Oral ONCE-1800  . Warfarin - Pharmacist Dosing Inpatient   Does not apply q1800  . DISCONTD: levofloxacin (LEVAQUIN) IV  250 mg Intravenous Q24H    Assessment: Principal Problem:  *Pneumonia Active Problems:  Atrial fibrillation  HEMOPTYSIS UNSPECIFIED  Diastolic CHF, acute on chronic  Leukocytosis  SOB (shortness of breath)   Plan: #1 Pseudomonas aeruginosa bilateral pneumonia Afebrile. CBC is trending down. Sputum culture positive for Pseudomonas aeruginosa. Continue oxygen, will DC Levaquin and start patient on IV Zosyn antibiotic day #4. Continue mucolytics, nebs, flutter valve. Follow.  #2 atrial fibrillation Continue diltiazem, digoxin, sotalol for rate control. Coumadin for anticoagulations.  #3 chronic hemoptysis Likely secondary to problem #1. Slowly improving. Prior attending physician spoke with patient's pulmonologist Dr. Maple Hudson who states no further workup is needed at this time.  #4 acute on chronic  diastolic CHF Improved. I/O. equal -1.2 L over the past 24 hours. Continue current dose of Lasix, digoxin, diltiazem.  #5 leukocytosis Secondary to problem #1 in the setting of chronic steroid use. WBC trending down. Continue empiric IV antibiotics.  #6. shortness  of breath Likely secondary to problem #1.  #7 chronic respiratory failure on home O2 Stable. Continue Spiriva, nebs, oxygen, see problem #1.  #8 prophylaxis On Coumadin for DVT prophylaxis.   LOS: 4 days   THOMPSON,DANIEL [phone] 05/08/2012, 11:57 AM

## 2012-05-08 NOTE — Progress Notes (Signed)
Room 1502  Patient [facility] [place] [facility] note Support visit completed with patient, no family present.  She denied pain and said she was being treated for pneumonia (fifith episode since last Nov. According to patient).  She desires to return home, has hired caregiver and supportive daughters and grandchildren.  She reports feeling better and very appreciative of opportunity to talk and all of hospice staff support. Jenna Luo, LCSW [facility] [place], [place] [phone]

## 2012-05-08 NOTE — Progress Notes (Signed)
ANTICOAGULATION CONSULT NOTE - Follow Up Consult  Pharmacy Consult for:  Coumadin Indication: Atrial fibrillation, bioprosthetic AVR  Allergies  Allergen Reactions  . Cephalexin     Unsure as to reaction.  ? Hives with Cephalexin; "I can take Penicillin"  . Atorvastatin     Forgetfulness   . Azithromycin Nausea And Vomiting  . Codeine Nausea And Vomiting  . Crestor (Rosuvastatin Calcium)     Forgetfulness    . Estradiol     Unknown reaction  . Hydrocortisone Hives    Flea bites were mistaken as rash from hydrocortisone  . Lovastatin Other (See Comments)    forgetfullness,  . Moxifloxacin     Unknown reaction  . Sulfonamide Derivatives     itching   Patient Measurements: Height: 5\' 2"  (157.5 cm) Weight: 123 lb 14.4 oz (56.2 kg) (Standing scale ) IBW/kg (Calculated) : 50.1   Vital Signs: Temp: 97.3 F (36.3 C) (07/04 0600) Temp src: Oral (07/04 0600) BP: 133/65 mmHg (07/04 0600) Pulse Rate: 66  (07/04 0600)  Labs:  Basename 05/08/12 0420 05/07/12 0500 05/06/12 0435  HGB 11.7* 11.5* --  HCT 37.2 36.6 --  PLT 208 209 --  APTT -- -- --  LABPROT 22.6* 22.6* 25.7*  INR 1.95* 1.95* 2.30*  HEPARINUNFRC -- -- --  CREATININE 0.53 0.55 --  CKTOTAL -- -- --  CKMB -- -- --  TROPONINI -- -- --   Estimated Creatinine Clearance: 40.7 ml/min (by C-G formula based on Cr of 0.53).  Assessment:  78 y/o female on chronic anticoagulation for atrial fibrillation (also has bioprosthetic AVR).  PTA dose documented as 0.5 mg Tues/Fri and 1 mg other days of the week.   Last dose 6/29.  Hemoptysis reported on admission, with pneumonia as possible cause.  Coumadin on hold x 2 days and resumed 7/1 when INR was therapeutic but 7/1 dose of Coumadin NOT charted as given as patient refused.  Receiving Levaquin, which may increase the anticoagulant effect of Coumadin.  INR 1.95 (same as yesterday), CBC stable.  No evidence of hemoptysis continuing per RN (last episode 7/2)  Goal of  Therapy:  INR 2-3   Plan:   Warfarin 1 mg x 1 dose today.  Follow PT/INR daily. F/u if hemoptysis returns  Geoffry Paradise, PharmD, BCPS Pager: [phone] 7:43 AM

## 2012-05-08 NOTE — Progress Notes (Signed)
CARE MANAGE MENT UTILIZATION REVIEW NOTE 05/08/2012     Patient:  [name], [name]   Account Number:  [id number]  Documented by:  Bjorn Loser Apryl Brymer   Per Ur Regulation Reviewed for med. necessity/level of care/duration of stay

## 2012-05-09 LAB — BASIC METABOLIC PANEL
CO2: 34 mEq/L — ABNORMAL HIGH (ref 19–32)
Calcium: 9.5 mg/dL (ref 8.4–10.5)
Chloride: 92 mEq/L — ABNORMAL LOW (ref 96–112)
Glucose, Bld: 95 mg/dL (ref 70–99)
Potassium: 4.1 mEq/L (ref 3.5–5.1)
Sodium: 133 mEq/L — ABNORMAL LOW (ref 135–145)

## 2012-05-09 LAB — CBC
Hemoglobin: 12.9 g/dL (ref 12.0–15.0)
MCH: 25.7 pg — ABNORMAL LOW (ref 26.0–34.0)
Platelets: 213 10*3/uL (ref 150–400)
RBC: 5.01 MIL/uL (ref 3.87–5.11)
WBC: 11.9 10*3/uL — ABNORMAL HIGH (ref 4.0–10.5)

## 2012-05-09 LAB — PROTIME-INR
INR: 1.83 — ABNORMAL HIGH (ref 0.00–1.49)
Prothrombin Time: 21.5 seconds — ABNORMAL HIGH (ref 11.6–15.2)

## 2012-05-09 MED ORDER — ALPRAZOLAM 0.25 MG PO TABS
0.2500 mg | ORAL_TABLET | Freq: Every day | ORAL | Status: DC
  Administered 2012-05-09: 0.25 mg via ORAL
  Filled 2012-05-09: qty 1

## 2012-05-09 MED ORDER — WARFARIN SODIUM 1 MG PO TABS
1.0000 mg | ORAL_TABLET | Freq: Once | ORAL | Status: AC
  Administered 2012-05-09: 1 mg via ORAL
  Filled 2012-05-09: qty 1

## 2012-05-09 NOTE — Progress Notes (Signed)
ANTIBIOTIC CONSULT NOTE   Pharmacy Consult for Zosyn Indication: pseudomonal pneumonia  Allergies  Allergen Reactions  . Cephalexin     Unsure as to reaction.  ? Hives with Cephalexin; "I can take Penicillin"  . Atorvastatin     Forgetfulness   . Azithromycin Nausea And Vomiting  . Codeine Nausea And Vomiting  . Crestor (Rosuvastatin Calcium)     Forgetfulness    . Estradiol     Unknown reaction  . Hydrocortisone Hives    Flea bites were mistaken as rash from hydrocortisone  . Lovastatin Other (See Comments)    forgetfullness,  . Moxifloxacin     Unknown reaction  . Sulfonamide Derivatives     itching    Patient Measurements: Height: 5\' 2"  (157.5 cm) Weight: 123 lb 1.6 oz (55.838 kg) IBW/kg (Calculated) : 50.1   Vital Signs: Temp: 97.2 F (36.2 C) (07/05 0544) Temp src: Oral (07/05 0544) BP: 154/76 mmHg (07/05 0544) Pulse Rate: 60  (07/05 0544) Intake/Output from previous day: 07/04 0701 - 07/05 0700 In: 533 [P.O.:480; I.V.:3; IV Piggyback:50] Out: 3550 [Urine:3550] Intake/Output from this shift: Total I/O In: -  Out: 200 [Urine:200]  Labs:  [facility] 05/09/12 0445 05/08/12 0420 05/07/12 0500  WBC 11.9* 12.2* 13.4*  HGB 12.9 11.7* 11.5*  PLT 213 208 209  LABCREA -- -- --  CREATININE 0.65 0.53 0.55   Estimated Creatinine Clearance: 40.7 ml/min (by C-G formula based on Cr of 0.65). No results found for this basename: VANCOTROUGH:2,VANCOPEAK:2,VANCORANDOM:2,GENTTROUGH:2,GENTPEAK:2,GENTRANDOM:2,TOBRATROUGH:2,TOBRAPEAK:2,TOBRARND:2,AMIKACINPEAK:2,AMIKACINTROU:2,AMIKACIN:2, in the last 72 hours   Medical History: Past Medical History  Diagnosis Date  . Asthmatic bronchitis   . Allergic rhinitis   . Atrial fibrillation   . History of aortic valve replacement Dr. Tyrone Sage 2006    Has severe residual gradient and will be managed conservatively  . Sleep apnea   . Chronic obstructive asthma   . Osteoporosis   . Pruritic disorder   . Diverticulosis   .  Hemorrhoids   . GERD (gastroesophageal reflux disease)   . IBS (irritable bowel syndrome)   . Pacemaker 2010  . Oxygen dependent   . Atrial flutter     on Betapace  . High risk medication use     on Betapace & Coumadin  . Chronic anticoagulation   . COPD (chronic obstructive pulmonary disease)   . Pneumonia   . Anemia   . Arthritis   . Hypertension   . Coronary artery disease   . Pneumonia 09/2011    in NH for Rehab post discharge until 12/18   Assessment:  87 YOF on D#6 antibiotics, D#2 Zosyn for PNA - changed from Levaquin after sputum culture from 7/1 reported as growing Pseudomonas aeruginosa, R to Levaquin (per telephone conversation with microlab) and I to Cipro.  Dr. Janee Morn requested recommendation for oral antibiotic options at discharge.  Only PO option that could cover P.aeruginosa is a quinolone; would favor using Cipro since isolate reported as I vs this agent and R vs Levaquin.   Since patient was clinically improving on quinolone, hopefully this will be sufficient.  Plan:   Continue Zosyn 3.375 gm IV q8h (extended interval over 4 hours) while inpatient.  Consider transition to oral Cipro when discharged.  Hope Budds, PharmD, BCPS Pager: [phone] 05/09/2012,8:47 AM

## 2012-05-09 NOTE — Progress Notes (Signed)
[facility]: Sw note  Pt's chart reviewed. Pt was sitting up in bed eating lunch, paid caregiver, Willette at Pt's bedside. Pt stated that she was feeling "better than yesterday." Pt stated that she still remains hopeful to return home soon and is hoping this new antibiotic "will get rid of this stuff." Pt denied any concerns or needs at this time, her only complaint was "I have banana pudding everyday since I have been here and I can't get any" Pt stated other than that she was ok. Sw will continue to follow for support during this hospital stay and will continue to follow Pt at home.  Lorain Childes, LCSW 05/09/12.

## 2012-05-09 NOTE — Progress Notes (Signed)
2045:  Notified by RN that pt has had mx R on T PVC's through-out the day (per monitor) Subjective: At bedside pt voicing no c/o's CP. Some SOB w/ exertion which is not new. Objective: Pt noted sitting and conversing w/ daughter. 12 lead EKG reveals Atrial flutter w/  4:1 AV conduction w/ rate of 67 (Not new). Unable to capture PVC's. Rhythm strips w/ complexes suspicious for R on T PVC's but difficult to assess given underlying A-Flutter and occasional paced beats.  BP- 122/69, T-98.2, P-68, R-18 w/ 02 sats of 99% on 3L 02 via [place]. BBS w/ rhonchi bil R>L that clears w/ coughing. Mag level drawn and shown to be slightly low at 1.9.  Assessment plan:  1. Possible R on T PVC's in clinical setting of pt w/ bil CAP, COPD, A-Fib/flutter, hx of Aortic valve replacement 2006, CABG, pacer and cardiac cath : Asymptomatic. VSS. Of note pt is on several medications that can prolong Q-T interval.  2. Low normal magnesium: (repleted) Consultation w/ pt's cardiologist considered but given pt asymptomatic and very stable will continue to monitor closely unless complexes become more persistent and or pt becomes symptomatic. Discussed the concern w/ pt and daughter and plan to continue closely monitor.  I have discussed pt w/ Dr Verta Ellen who is agreeable w/ plan.

## 2012-05-09 NOTE — Progress Notes (Signed)
Room 6736 - [name] -   [facility] RN Visit  Related admission to [facility] diagnosis of COPD.   Pt is FULL code and we discussed, she will continue to discuss with family to possibly change to limited code.    Pt alert & oriented, sitting up in bed, with complaints of weakness and concern over heart issues that surfaced yesterday.    No family present.    Pt concerned she did not get Xanax for the past 2 nights until last pm when she questioned the staff RN.  Discussed with staff RN on shift and she has addressed as scheduled med.   Please call [facility] @ [phone] with any hospice needs.  Thank you.  Joneen Boers, RN  [facility]  Hospice Liaison

## 2012-05-09 NOTE — Progress Notes (Signed)
ANTICOAGULATION CONSULT NOTE - Follow Up Consult  Pharmacy Consult for:  Coumadin Indication: Atrial fibrillation, bioprosthetic AVR  Allergies  Allergen Reactions  . Cephalexin     Unsure as to reaction.  ? Hives with Cephalexin; "I can take Penicillin"  . Atorvastatin     Forgetfulness   . Azithromycin Nausea And Vomiting  . Codeine Nausea And Vomiting  . Crestor (Rosuvastatin Calcium)     Forgetfulness    . Estradiol     Unknown reaction  . Hydrocortisone Hives    Flea bites were mistaken as rash from hydrocortisone  . Lovastatin Other (See Comments)    forgetfullness,  . Moxifloxacin     Unknown reaction  . Sulfonamide Derivatives     itching   Patient Measurements: Height: 5\' 2"  (157.5 cm) Weight: 123 lb 1.6 oz (55.838 kg) IBW/kg (Calculated) : 50.1   Vital Signs: Temp: 97.2 F (36.2 C) (07/05 0544) Temp src: Oral (07/05 0544) BP: 154/76 mmHg (07/05 0544) Pulse Rate: 60  (07/05 0544)  Labs:  Basename 05/09/12 0445 05/08/12 0420 05/07/12 0500  HGB 12.9 11.7* --  HCT 41.2 37.2 36.6  PLT 213 208 209  APTT -- -- --  LABPROT 21.5* 22.6* 22.6*  INR 1.83* 1.95* 1.95*  HEPARINUNFRC -- -- --  CREATININE 0.65 0.53 0.55  CKTOTAL -- -- --  CKMB -- -- --  TROPONINI -- -- --   Estimated Creatinine Clearance: 40.7 ml/min (by C-G formula based on Cr of 0.65).  Inpatient warfarin doses 6/29 - 7/4: 0, 0, 0, 0.5, 0.5, 1mg   Assessment:  78 y/o female on chronic anticoagulation for atrial fibrillation (also has bioprosthetic AVR).  PTA dose documented as 0.5 mg Tues/Fri and 1 mg other days of the week.    Hemoptysis reported on admission, with pneumonia as possible cause.  Coumadin held x 2 days and resumed 7/1 when INR was therapeutic but patient refused restart until 7/2.  Levaquin discontinued, so Coumadin requirements might increase closer to PTA dosage.  Patient states one episode mild hemoptysis yesterday and small amount dried blood in sputum this AM.    She says this is improving.  INR subtherapeutic and falling.    Goal of Therapy:  INR 2-3   Plan:   Warfarin 1 mg PO x 1 dose today.  Follow PT/INR daily.   F/U on hemoptysis.  Hope Budds, PharmD, BCPS Pager: [phone] 05/09/2012 8:16 AM

## 2012-05-09 NOTE — Progress Notes (Signed)
Physical Therapy Treatment Patient Details Name: [name] MRN: [id number] DOB: May 04, 1924 Today's Date: 05/09/2012 Time: 7829-5621 PT Time Calculation (min): 17 min  PT Assessment / Plan / Recommendation Comments on Treatment Session  Progressing well with activity tolerance. Still unsteady with ambulation.    Follow Up Recommendations  Home health PT;Supervision/Assistance - 24 hour    Barriers to Discharge        Equipment Recommendations  None recommended by PT    Recommendations for Other Services OT consult  Frequency Min 3X/week   Plan Discharge plan remains appropriate    Precautions / Restrictions Precautions Precautions: Fall Restrictions Weight Bearing Restrictions: No   Pertinent Vitals/Pain     Mobility  Bed Mobility Bed Mobility: Supine to Sit Supine to Sit: HOB elevated;With rails;6: Modified independent (Device/Increase time) Transfers Transfers: Sit to Stand;Stand to Sit Sit to Stand: 4: Min guard;From bed Stand to Sit: 4: Min guard;To bed;To chair/3-in-1 Details for Transfer Assistance: VCs safety, hand placement.  Ambulation/Gait Ambulation/Gait Assistance: 4: Min assist Ambulation Distance (Feet): 80 Feet (40'x1, 80'x1) Assistive device: None Ambulation/Gait Assistance Details: VCs safety. Slow gait speed. Pt intermittently reaching out for handrail. O2 sats 91% on 6L O2 after ambulation. Seated rest break between walks. Dyspnea 2-3/4 Gait Pattern: Decreased stride length;Decreased step length - right;Decreased step length - left;Step-through pattern    Exercises     PT Diagnosis:    PT Problem List:   PT Treatment Interventions:     PT Goals Acute Rehab PT Goals PT Goal: Supine/Side to Sit - Progress: Met PT Goal: Sit to Stand - Progress: Progressing toward goal PT Goal: Ambulate - Progress: Progressing toward goal  Visit Information  Last PT Received On: 05/09/12 Assistance Needed: +1    Subjective Data  Subjective: "I just  don't want to go under 80%" Patient Stated Goal: Home   Cognition  Overall Cognitive Status: Appears within functional limits for tasks assessed/performed Arousal/Alertness: Awake/alert Orientation Level: Appears intact for tasks assessed Behavior During Session: [facility] for tasks performed    Balance  Balance Balance Assessed: Yes Dynamic Standing Balance Dynamic Standing - Balance Support: No upper extremity supported Dynamic Standing - Level of Assistance: 4: Min assist  End of Session PT - End of Session Equipment Utilized During Treatment: Gait belt;Oxygen Activity Tolerance: Patient limited by fatigue Patient left: in chair;with call bell/phone within reach   GP     [name] [facility] 05/09/2012, 10:07 AM [phone]

## 2012-05-09 NOTE — Progress Notes (Signed)
[facility] Chaplain:  Brief visit with pt in follow-up for ongoing spiritual care.  Pt smiling and "feeling better," joined in room by hired [employer].  Pt shared about hoping to have been home by now, but understanding that she is now getting another antibiotic before she returns home. Willette will allow for family to take trips out of town.  Pt open about faith issues and thankful for prayer. Lovenia Shuck, [facility] Chaplain

## 2012-05-09 NOTE — Progress Notes (Signed)
Subjective: R. on T. PVCs noted per telemetry. Patient however asymptomatic. Patient's heart rate has been less than 100. Denies any chest pain. Patient states SOB slowly improving. Patient with some brownish sputum today.  Objective: Vital signs in last 24 hours: Filed Vitals:   05/09/12 0221 05/09/12 0544 05/09/12 0824 05/09/12 1520  BP: 133/76 154/76  120/65  Pulse: 60 60  67  Temp: 97.5 F (36.4 C) 97.2 F (36.2 C)  97.6 F (36.4 C)  TempSrc: Oral Oral  Oral  Resp: 20 18  20   Height:      Weight:  55.838 kg (123 lb 1.6 oz)    SpO2: 100% 96% 97% 92%    Intake/Output Summary (Last 24 hours) at 05/09/12 1649 Last data filed at 05/09/12 1332  Gross per 24 hour  Intake      0 ml  Output   3300 ml  Net  -3300 ml    Weight change: -0.362 kg (-12.8 oz)  General: Alert, awake, oriented x3, in no acute distress. HEENT: No bruits, no goiter. Heart: Regular rate and rhythm, with 3/6 SEM murmurs, rubs, gallops. Lungs: Coarse BS, Abdomen: Soft, nontender, nondistended, positive bowel sounds. Extremities: No clubbing cyanosis or edema with positive pedal pulses.    Lab Results:  Basename 05/09/12 0445 05/08/12 2145 05/08/12 0420  NA 133* -- 132*  K 4.1 -- 3.9  CL 92* -- 92*  CO2 34* -- 32  GLUCOSE 95 -- 111*  BUN 13 -- 13  CREATININE 0.65 -- 0.53  CALCIUM 9.5 -- 9.4  MG -- 1.9 --  PHOS -- -- --   No results found for this basename: AST:2,ALT:2,ALKPHOS:2,BILITOT:2,PROT:2,ALBUMIN:2 in the last 72 hours No results found for this basename: LIPASE:2,AMYLASE:2 in the last 72 hours  Basename 05/09/12 0445 05/08/12 0420  WBC 11.9* 12.2*  NEUTROABS -- --  HGB 12.9 11.7*  HCT 41.2 37.2  MCV 82.2 81.9  PLT 213 208   No results found for this basename: CKTOTAL:3,CKMB:3,CKMBINDEX:3,TROPONINI:3 in the last 72 hours No components found with this basename: POCBNP:3 No results found for this basename: DDIMER:2 in the last 72 hours No results found for this basename: HGBA1C:2 in  the last 72 hours No results found for this basename: CHOL:2,HDL:2,LDLCALC:2,TRIG:2,CHOLHDL:2,LDLDIRECT:2 in the last 72 hours No results found for this basename: TSH,T4TOTAL,FREET3,T3FREE,THYROIDAB in the last 72 hours No results found for this basename: VITAMINB12:2,FOLATE:2,FERRITIN:2,TIBC:2,IRON:2,RETICCTPCT:2 in the last 72 hours  Micro Results: Recent Results (from the past 240 hour(s))  CULTURE, BLOOD (ROUTINE X 2)     Status: Normal (Preliminary result)   Collection Time   05/04/12  9:22 PM      Component Value Range Status Comment   Specimen Description BLOOD LEFT ANTECUBITAL   Final    Special Requests BOTTLES DRAWN AEROBIC AND ANAEROBIC 8CC   Final    Culture  Setup Time 05/05/2012 01:21   Final    Culture     Final    Value:        BLOOD CULTURE RECEIVED NO GROWTH TO DATE CULTURE WILL BE HELD FOR 5 DAYS BEFORE ISSUING A FINAL NEGATIVE REPORT   Report Status PENDING   Incomplete   CULTURE, BLOOD (ROUTINE X 2)     Status: Normal (Preliminary result)   Collection Time   05/04/12  9:25 PM      Component Value Range Status Comment   Specimen Description BLOOD RIGHT ANTECUBITAL   Final    Special Requests BOTTLES DRAWN AEROBIC ONLY 10CC   Final  Culture  Setup Time 05/05/2012 01:20   Final    Culture     Final    Value:        BLOOD CULTURE RECEIVED NO GROWTH TO DATE CULTURE WILL BE HELD FOR 5 DAYS BEFORE ISSUING A FINAL NEGATIVE REPORT   Report Status PENDING   Incomplete   CULTURE, EXPECTORATED SPUTUM-ASSESSMENT     Status: Normal   Collection Time   05/05/12  3:21 PM      Component Value Range Status Comment   Specimen Description SPUTUM   Final    Special Requests NONE   Final    Sputum evaluation     Final    Value: THIS SPECIMEN IS ACCEPTABLE. RESPIRATORY CULTURE REPORT TO FOLLOW.   Report Status 05/05/2012 FINAL   Final   CULTURE, RESPIRATORY     Status: Normal   Collection Time   05/05/12  3:21 PM      Component Value Range Status Comment   Specimen Description  SPUTUM   Final    Special Requests NONE   Final    Gram Stain     Final    Value: NO WBC SEEN     NO SQUAMOUS EPITHELIAL CELLS SEEN     NO ORGANISMS SEEN   Culture MODERATE PSEUDOMONAS AERUGINOSA   Final    Report Status 05/08/2012 FINAL   Final    Organism ID, Bacteria PSEUDOMONAS AERUGINOSA   Final     Studies/Results: No results found.  Medications:     . ALPRAZolam  0.25 mg Oral QHS  . calcium-vitamin D  2 tablet Oral Q1200  . digoxin  125 mcg Oral QPM  . diltiazem  240 mg Oral Daily  . docusate sodium  100 mg Oral Daily  . ezetimibe  10 mg Oral QPM  . ferrous sulfate  325 mg Oral Q lunch  . furosemide  20 mg Oral BID  . guaiFENesin  1,200 mg Oral BID  . levalbuterol  1.25 mg Nebulization QID  . magnesium sulfate 1 - 4 g bolus IVPB  2 g Intravenous Once  . methylPREDNISolone  16 mg Oral Q breakfast  . montelukast  10 mg Oral QHS  . omega-3 acid ethyl esters  1 g Oral Daily  . pantoprazole  40 mg Oral Daily  . piperacillin-tazobactam (ZOSYN)  IV  3.375 g Intravenous Q8H  . piperacillin-tazobactam (ZOSYN)  IV  3.375 g Intravenous NOW  . potassium chloride SA  20 mEq Oral BID  . senna  1 tablet Oral QHS  . sertraline  50 mg Oral QPM  . sodium chloride  3 mL Intravenous Q12H  . sotalol  80 mg Oral BID  . tiotropium  18 mcg Inhalation Daily  . vitamin C  500 mg Oral Daily  . warfarin  1 mg Oral ONCE-1800  . warfarin  1 mg Oral ONCE-1800  . Warfarin - Pharmacist Dosing Inpatient   Does not apply q1800  . DISCONTD: levalbuterol  1.25 mg Nebulization Q6H    Assessment: Principal Problem:  *Pneumonia Active Problems:  Atrial fibrillation  HEMOPTYSIS UNSPECIFIED  Diastolic CHF, acute on chronic  Leukocytosis  SOB (shortness of breath)   Plan: #1 Pseudomonas aeruginosa bilateral pneumonia Afebrile. CBC is trending down. Sputum culture positive for Pseudomonas aeruginosa. Continue oxygen,  IV Zosyn antibiotic day #5. Continue mucolytics, nebs, flutter valve.  Follow. On discharge will change to oral ciprofloxacin to complete a course of antibiotic therapy.  #2 atrial fibrillation Overnight and during  the day it was noted that patient did have some R. on T. PVCs. Complexes are narrow. Heart rate has been controlled less than 100. Patient does have a pacemaker. Patient is currently asymptomatic. And stable. Will monitor for now. Continue diltiazem, digoxin, sotalol for rate control. Coumadin for anticoagulations.  #3 chronic hemoptysis Likely secondary to problem #1. Slowly improving. Prior attending physician spoke with patient's pulmonologist Dr. Maple Hudson who states no further workup is needed at this time.  #4 acute on chronic diastolic CHF Improved. I/O. equal -3. L over the past 24 hours. Continue current dose of Lasix, digoxin, diltiazem.  #5 leukocytosis Secondary to problem #1 in the setting of chronic steroid use. WBC trending down. Continue empiric IV antibiotics.  #6. shortness of breath Likely secondary to problem #1.  #7 chronic respiratory failure on home O2 Stable. Continue Spiriva, nebs, oxygen, see problem #1.  #8 prophylaxis On Coumadin for DVT prophylaxis.   LOS: 5 days   Shaneisha Burkel [phone] 05/09/2012, 4:49 PM

## 2012-05-10 LAB — MAGNESIUM: Magnesium: 2.2 mg/dL (ref 1.5–2.5)

## 2012-05-10 LAB — BASIC METABOLIC PANEL
BUN: 14 mg/dL (ref 6–23)
Chloride: 93 mEq/L — ABNORMAL LOW (ref 96–112)
GFR calc Af Amer: >90 mL/min (ref >90)
GFR calc non Af Amer: 80 mL/min — ABNORMAL LOW (ref >90)
Potassium: 4.2 mEq/L (ref 3.5–5.1)

## 2012-05-10 LAB — CBC
Hemoglobin: 12.7 g/dL (ref 12.0–15.0)
MCHC: 31.1 g/dL (ref 30.0–36.0)
RDW: 15.9 % — ABNORMAL HIGH (ref 11.5–15.5)
WBC: 12.4 10*3/uL — ABNORMAL HIGH (ref 4.0–10.5)

## 2012-05-10 LAB — PROTIME-INR
INR: 1.93 — ABNORMAL HIGH (ref 0.00–1.49)
Prothrombin Time: 22.4 seconds — ABNORMAL HIGH (ref 11.6–15.2)

## 2012-05-10 MED ORDER — WARFARIN SODIUM 1 MG PO TABS
1.0000 mg | ORAL_TABLET | Freq: Once | ORAL | Status: DC
  Filled 2012-05-10: qty 1

## 2012-05-10 MED ORDER — CIPROFLOXACIN HCL 500 MG PO TABS: 500.0000 mg | ORAL_TABLET | Freq: Two times a day (BID) | ORAL | Status: AC

## 2012-05-10 MED ORDER — GUAIFENESIN ER 1200 MG PO TB12: 1200.0000 mg | ORAL_TABLET | Freq: Two times a day (BID) | ORAL | Status: DC

## 2012-05-10 MED ORDER — LEVALBUTEROL TARTRATE 45 MCG/ACT IN AERO: 2.0000 | INHALATION_SPRAY | RESPIRATORY_TRACT | Status: AC | PRN

## 2012-05-10 MED ORDER — METHYLPREDNISOLONE 8 MG PO TABS: 8.0000 mg | ORAL_TABLET | Freq: Every day | ORAL | Status: DC

## 2012-05-10 NOTE — Progress Notes (Addendum)
Cm spoke with patient concerning dc planning. PT suggest HHPT upon discharge. Per pt choice [facility] to provide HHPT. Patient followed by [facility] following patient. Patient states having HHAs & family assisting with home care. No other DME or HH services requested. CM contacted [facility] @ [phone] to notify oncall RN of patient discharge. Awaiting dc summary to fax to [facility] [phone]  Leonie Green [phone]

## 2012-05-10 NOTE — Progress Notes (Signed)
Went over DC papers with patient and daughter.  Cipro faxed to [facility] at [facility] for pickup.  CM has set up Hospice to resume.  Stressed to patient that she must take complete course of Cipro, verbalized understanding.  Transported to front of hospital accompanied by tech and daughter.

## 2012-05-10 NOTE — Progress Notes (Signed)
ANTICOAGULATION CONSULT NOTE - Follow Up Consult  Pharmacy Consult for:  Coumadin Indication: Atrial fibrillation, bioprosthetic AVR  Allergies  Allergen Reactions  . Cephalexin     Unsure as to reaction.  ? Hives with Cephalexin; "I can take Penicillin"  . Atorvastatin     Forgetfulness   . Azithromycin Nausea And Vomiting  . Codeine Nausea And Vomiting  . Crestor (Rosuvastatin Calcium)     Forgetfulness    . Estradiol     Unknown reaction  . Hydrocortisone Hives    Flea bites were mistaken as rash from hydrocortisone  . Lovastatin Other (See Comments)    forgetfullness,  . Moxifloxacin     Unknown reaction  . Sulfonamide Derivatives     itching   Patient Measurements: Height: 5\' 2"  (157.5 cm) Weight: 123 lb 3.8 oz (55.9 kg) IBW/kg (Calculated) : 50.1   Vital Signs: Temp: 98 F (36.7 C) (07/06 0600) Temp src: Oral (07/06 0600) BP: 154/74 mmHg (07/06 0600) Pulse Rate: 62  (07/06 0600)  Labs:  Basename 05/10/12 0600 05/09/12 0445 05/08/12 0420  HGB 12.7 12.9 --  HCT 40.8 41.2 37.2  PLT 199 213 208  APTT -- -- --  LABPROT 22.4* 21.5* 22.6*  INR 1.93* 1.83* 1.95*  HEPARINUNFRC -- -- --  CREATININE 0.61 0.65 0.53  CKTOTAL -- -- --  CKMB -- -- --  TROPONINI -- -- --   Estimated Creatinine Clearance: 40.7 ml/min (by C-G formula based on Cr of 0.61).  Inpatient warfarin doses 6/29 - 7/5: 0, 0, 0, 0.5, 0.5, 1, 1mg   Assessment:  78 y/o female on chronic anticoagulation for atrial fibrillation (also has bioprosthetic AVR).  PTA dose documented as 0.5 mg Tues/Fri and 1 mg other days of the week.    Hemoptysis reported on admission, with pneumonia as possible cause.  Coumadin held x 2 days and resumed 7/1 when INR was therapeutic but patient refused restart until 7/2.  By report, hemoptysis has improved.   INR still subtherapeutic but rising.    Goal of Therapy:  INR 2-3   Plan:   Warfarin 1 mg PO x 1 dose today.  Follow PT/INR daily.   F/U on  hemoptysis.  Charolotte Eke, PharmD, pager [phone]. 05/10/2012,8:42 AM.

## 2012-05-10 NOTE — Plan of Care (Signed)
Problem: Discharge Progression Outcomes Goal: If EF < 40% ACEI/ARB addressed at discharge Outcome: Not Applicable Date Met:  05/10/12 Last EF 11/12 60%

## 2012-05-10 NOTE — Plan of Care (Signed)
Problem: Phase II Progression Outcomes Goal: O2 sats > equal to 90% on RA or at baseline Outcome: Adequate for Discharge Patient on 4 liters at baseline  Goal: Discharge plan remains appropriate-arrangements made Outcome: Completed/Met Date Met:  05/10/12 Home with hospice and daughter   Problem: Discharge Progression Outcomes Goal: Discharge plan in place and appropriate Outcome: Adequate for Discharge Home with hospice

## 2012-05-10 NOTE — Discharge Summary (Signed)
Discharge Summary  [name] MR#: [id number]  DOB:06-Sep-1924  Date of Admission: 05/04/2012 Date of Discharge: 05/10/2012  Patient's PCP: Andrea Melnick, MD/[name]  Attending Physician:House,Andrea  Consults: Treatment Team: None   Discharge Diagnoses: CAP (community acquired pneumonia) Present on Admission:  .HEMOPTYSIS UNSPECIFIED .CAP (community acquired pneumonia) .Leukocytosis .SOB (shortness of breath) .Atrial fibrillation .Diastolic CHF, acute on chronic   Brief Admitting History and Physical [name] is a 78 y.o. white female who is followed by hospice. She has been having increasing fatigue and SOB along with more hemoptysis than usual. She has a complicated medical history. Despite being on hospice she is a full code.  No CP. C/o fever- subjective. Follow with [name] pulmonologist. On 4L of O2 at home.  In ER she was found to have a PNA that could be causing her hempotysis. Er wanted admitted for further work up. For the rest of admission history and physical please see H&P dictated by [name].      Discharge Medications Medication List  As of 05/10/2012  1:35 PM   START taking these medications         ciprofloxacin 500 MG tablet   Commonly known as: CIPRO   Take 1 tablet (500 mg total) by mouth 2 (two) times daily. Take for 5 days then stop.         CHANGE how you take these medications         Guaifenesin 1200 MG Tb12   Take 1 tablet (1,200 mg total) by mouth 2 (two) times daily at 10 AM and 5 PM. Take for 7 days then as needed.   What changed: doctor's instructions      levalbuterol 45 MCG/ACT inhaler   Commonly known as: XOPENEX HFA   Inhale 2 puffs into the lungs every 4 (four) hours as needed for wheezing or shortness of breath. For shortness of breath   What changed: reasons to take the med      methylPREDNISolone 8 MG tablet   Commonly known as: MEDROL   Take 1 tablet (8 mg total) by mouth daily. Take 2 tablets  (16mg ) daily x 5 days, then back to home dose of 8 mg daily.   What changed: doctor's instructions         CONTINUE taking these medications         ALPRAZolam 0.25 MG tablet   Commonly known as: XANAX      CALCIUM CITRATE + D PO      CHILDRENS ALLERGY 12.5 MG/5ML liquid   Generic drug: diphenhydrAMINE      digoxin 0.125 MG tablet   Commonly known as: LANOXIN      diltiazem 240 MG 24 hr capsule   Commonly known as: CARDIZEM CD      EPINEPHrine 0.15 MG/0.3ML injection   Commonly known as: EPIPEN JR   Inject 0.3 mLs (0.15 mg total) into the muscle as needed for anaphylaxis.      esomeprazole 40 MG capsule   Commonly known as: NEXIUM      ezetimibe 10 MG tablet   Commonly known as: ZETIA      ferrous sulfate 325 (65 FE) MG tablet      Fish Oil 1200 MG Caps      furosemide 40 MG tablet   Commonly known as: LASIX   Take 0.5 tablets (20 mg total) by mouth 2 (two) times daily.      levalbuterol 1.25 MG/3ML nebulizer solution  Commonly known as: XOPENEX      magic mouthwash Soln      mometasone 0.1 % cream   Commonly known as: ELOCON      Mometasone Furo-Formoterol Fum 200-5 MCG/ACT Aero   Inhale 2 puffs into the lungs 2 (two) times daily.      montelukast 10 MG tablet   Commonly known as: SINGULAIR      potassium chloride SA 20 MEQ tablet   Commonly known as: K-DUR,KLOR-CON   Take 1 tablet (20 mEq total) by mouth 2 (two) times daily.      senna 8.6 MG tablet   Commonly known as: SENOKOT      sertraline 50 MG tablet   Commonly known as: ZOLOFT      sotalol 80 MG tablet   Commonly known as: BETAPACE      tiotropium 18 MCG inhalation capsule   Commonly known as: SPIRIVA      vitamin C 500 MG tablet   Commonly known as: ASCORBIC ACID      warfarin 1 MG tablet   Commonly known as: COUMADIN          Where to get your medications    These are the prescriptions that you need to pick up.   You may get these medications from any pharmacy.          ciprofloxacin 500 MG tablet   Guaifenesin 1200 MG Tb12   levalbuterol 45 MCG/ACT inhaler   methylPREDNISolone 8 MG tablet           Hospital Course: #1: CAP (community acquired pneumonia) Patient was admitted to the hospital and with concerns of a community acquired pneumonia as seen on chest x-ray shown to be bilateral. Patient had also presented with some hemoptysis which was felt likely secondary to her pneumonia. Patient was placed on IV Levaquin her home regimen of steroid dose was doubled and she was placed on nebulizer treatments as well as mucolytic. Hemostasis was discussed by attending physician [name] with her ophthalmologist [name] who felt no other further workup was needed. A sputum Gram stain and culture was obtained which grew out pseudomonas aeruginosa with intermediate sensitivity to fluoroquinolones and sensitive to Zosyn. Patient improved slowly and clinically during the hospitalization. Once sputum Gram stain and culture sensitivities have come back and IV Levaquin was subsequently transitioned to IV Zosyn which patient was maintained on. Patient's hemoptysis improved and she continued to improve clinically. Patient will be discharged home on oral ciprofloxacin twice daily for 5 more days to complete a ten-day course of antibiotic therapy. Patient will followup with [name] 1 week post discharge. Patient be discharged home back with home hospice. Patient was discharged in stable and improved condition.  #2 atrial fibrillation On admission patient was noted to have a history of A. fib/A. flutter and a pacemaker. Patient was maintained on her home regimen of sotalol, Cardizem, digoxin. She was maintained on Coumadin for anticoagulation. During the hospitalization patient was noted to have possible R. on T. PVCs which are likely secondary to her community acquired pneumonia which was bilateral history of COPD and history of replacement. Patient was asymptomatic  denied any chest pain and no significant change in her shortness of breath. Patient's heart rate was controlled and a such patient was monitored. Patient will be discharged home in stable condition. Her Coumadin was dosed per pharmacy during the hospitalization. Patient will need a PT/INR checked on Monday, 05/12/2012 per hospice and results called  in to her cardiologist or whomever has been managing her Coumadin. Patient was discharged in stable condition. Patient will followup with her cardiologist 2 weeks post discharge.  The rest of patient's chronic medical issues remained stable throughout the hospitalization.   Present on Admission:  .HEMOPTYSIS UNSPECIFIED .CAP (community acquired pneumonia) .Leukocytosis .SOB (shortness of breath) .Atrial fibrillation .Diastolic CHF, acute on chronic   Day of Discharge BP 154/74  Pulse 62  Temp 98 F (36.7 C) (Oral)  Resp 18  Ht 5\' 2"  (1.575 m)  Wt 55.9 kg (123 lb 3.8 oz)  BMI 22.54 kg/m2  SpO2 97% Subjective: No complaints. Patient still with little bit of hemoptysis however not significant. Patient states shortness of breath is improving. General: Alert, awake, oriented x3, in no acute distress. HEENT: No bruits, no goiter. Heart: Regular rate and rhythm, without murmurs, rubs, gallops. Lungs: Coarse breath sounds improved Abdomen: Soft, nontender, nondistended, positive bowel sounds. Extremities: No clubbing cyanosis or edema with positive pedal pulses. Neuro: Grossly intact, nonfocal.   Results for orders placed during the hospital encounter of 05/04/12 (from the past 48 hour(s))  MAGNESIUM     Status: Normal   Collection Time   05/08/12  9:45 PM      Component Value Range Comment   Magnesium 1.9  1.5 - 2.5 mg/dL   PROTIME-INR     Status: Abnormal   Collection Time   05/09/12  4:45 AM      Component Value Range Comment   Prothrombin Time 21.5 (*) 11.6 - 15.2 seconds    INR 1.83 (*) 0.00 - 1.49   CBC     Status: Abnormal    Collection Time   05/09/12  4:45 AM      Component Value Range Comment   WBC 11.9 (*) 4.0 - 10.5 K/uL    RBC 5.01  3.87 - 5.11 MIL/uL    Hemoglobin 12.9  12.0 - 15.0 g/dL    HCT 04.5  40.9 - 81.1 %    MCV 82.2  78.0 - 100.0 fL    MCH 25.7 (*) 26.0 - 34.0 pg    MCHC 31.3  30.0 - 36.0 g/dL    RDW 91.4 (*) 78.2 - 15.5 %    Platelets 213  150 - 400 K/uL   BASIC METABOLIC PANEL     Status: Abnormal   Collection Time   05/09/12  4:45 AM      Component Value Range Comment   Sodium 133 (*) 135 - 145 mEq/L    Potassium 4.1  3.5 - 5.1 mEq/L    Chloride 92 (*) 96 - 112 mEq/L    CO2 34 (*) 19 - 32 mEq/L    Glucose, Bld 95  70 - 99 mg/dL    BUN 13  6 - 23 mg/dL    Creatinine, Ser 9.56  0.50 - 1.10 mg/dL    Calcium 9.5  8.4 - 21.3 mg/dL    GFR calc non Af Amer 79 (*) >90 mL/min    GFR calc Af Amer >90  >90 mL/min   PROTIME-INR     Status: Abnormal   Collection Time   05/10/12  6:00 AM      Component Value Range Comment   Prothrombin Time 22.4 (*) 11.6 - 15.2 seconds    INR 1.93 (*) 0.00 - 1.49   BASIC METABOLIC PANEL     Status: Abnormal   Collection Time   05/10/12  6:00 AM      Component  Value Range Comment   Sodium 133 (*) 135 - 145 mEq/L    Potassium 4.2  3.5 - 5.1 mEq/L    Chloride 93 (*) 96 - 112 mEq/L    CO2 33 (*) 19 - 32 mEq/L    Glucose, Bld 88  70 - 99 mg/dL    BUN 14  6 - 23 mg/dL    Creatinine, Ser 1.61  0.50 - 1.10 mg/dL    Calcium 9.1  8.4 - 09.6 mg/dL    GFR calc non Af Amer 80 (*) >90 mL/min    GFR calc Af Amer >90  >90 mL/min   CBC     Status: Abnormal   Collection Time   05/10/12  6:00 AM      Component Value Range Comment   WBC 12.4 (*) 4.0 - 10.5 K/uL    RBC 4.94  3.87 - 5.11 MIL/uL    Hemoglobin 12.7  12.0 - 15.0 g/dL    HCT 04.5  40.9 - 81.1 %    MCV 82.6  78.0 - 100.0 fL    MCH 25.7 (*) 26.0 - 34.0 pg    MCHC 31.1  30.0 - 36.0 g/dL    RDW 91.4 (*) 78.2 - 15.5 %    Platelets 199  150 - 400 K/uL   MAGNESIUM     Status: Normal   Collection Time   05/10/12  6:00  AM      Component Value Range Comment   Magnesium 2.2  1.5 - 2.5 mg/dL     Dg Chest 2 View  9/56/2130  *RADIOLOGY REPORT*  Clinical Data: Shortness of breath and hemoptysis  CHEST - 2 VIEW  Comparison: 12/17/2011  Findings: There is a right chest wall pacer device with lead in the right atrial appendage and right ventricle.  Previous median sternotomy CABG procedure.  There is a left pleural effusion. Subpleural consolidation in the periphery of the right upper lobe and airspace disease in the left lower lobe is noted.  IMPRESSION:  1.  Bilateral airspace opacities which may represent multifocal infection. 2.  Suspect left pleural effusion.  Original Report Authenticated By: Rosealee Albee, M.D.     Disposition: Home with home health PT and hospice  Diet: Low sodium heart healthy  Activity: Increase activity slowly   Follow-up Appts: Discharge Orders    Future Appointments: Provider: Department: Dept Phone: Center:   05/27/2012 2:45 PM Waymon Budge, MD [facility] [phone] None   06/26/2012 9:25 AM [facility] [phone] [facility]     Future Orders Please Complete By Expires   Diet - low sodium heart healthy      Increase activity slowly      Discharge instructions      Comments:   Follow up with Dr [name] in 1 week. Follow up with Dr Elease Hashimoto in 2 weeks. Will need a PT/INR check on Monday 05/12/12 by hospice nurse.      TESTS THAT NEED FOLLOW-UP Will need a PT/INR check on Monday, 05/12/2012 by hospice nurse  Time spent on discharge, talking to the patient, and coordinating care: 45 mins.   SignedRamiro Harvest 05/10/2012, 1:35 PM

## 2012-05-11 LAB — CULTURE, BLOOD (ROUTINE X 2): Culture: NO GROWTH

## 2012-05-12 ENCOUNTER — Telehealth: Payer: Self-pay | Admitting: Internal Medicine

## 2012-05-12 ENCOUNTER — Ambulatory Visit: Payer: Self-pay | Admitting: Cardiology

## 2012-05-12 DIAGNOSIS — I4891 Unspecified atrial fibrillation: Secondary | ICD-10-CM

## 2012-05-12 LAB — POCT INR: INR: 2.2

## 2012-05-12 NOTE — Progress Notes (Signed)
Agree with assessment and plan Debbora Presto, MD  [facility] Pager [phone]  If 7PM-7AM, please contact night-coverage [email or web address] Password [email or web address]

## 2012-05-12 NOTE — Telephone Encounter (Signed)
Called, spoke with [name].  Advised of below per Dr. Maple Hudson.  She verbalized understanding of this.  She has a few more questions for Dr. Maple Hudson: 1.  Pt was d/c'd on guaifenesin 1200 mg bid x 7 days then prn -- would like to know if this sounds right? 2.  Would like orders to resume previous home care orders prior to admission. 3.  Prior to admission, [name] states pt had orders to take xopenex neb tid and an additional 4th time if needed.  Per Ursula Alert, this was changed to tid at d/c.  She would like order to resume the xopenex neb tid and an additional 4 th time needed  Dr. Maple Hudson, pls advise. Thank you.

## 2012-05-12 NOTE — Telephone Encounter (Signed)
Per CY-okay for all orders and requests as listed.

## 2012-05-12 NOTE — Telephone Encounter (Signed)
Per CY-Mucinex dose is too high;rec 600mg  bid or 1200mg  qd; all other orders/requests are okay as well.

## 2012-05-12 NOTE — Telephone Encounter (Signed)
lmomtcb x1 for wendi 

## 2012-05-12 NOTE — Telephone Encounter (Signed)
Spoke with [name] has several questions/concerns for CY to address:  1. Was in hospital 6/30 through 7/6  2. Hospitalist ordered PT/OT- CY okay to order this as attending MD  3. Okay to resume allergy injections on Fri 05-16-12 as patient has not had injections since being in hospital  4. Okay to resume O2 continuous at a rate of 4-5 L/M based on how she is feeling  5. Pt has follow up on 05-27-12 and will review post hospital then.    Please advise. Thanks.

## 2012-05-13 NOTE — Telephone Encounter (Signed)
lmomtcb x 2  

## 2012-05-13 NOTE — Telephone Encounter (Signed)
wendi w/ hospice returned call. [name]

## 2012-05-14 NOTE — Telephone Encounter (Signed)
[name] notified of [name] recs for Mucinex and that all the other orders at discharge are fine.

## 2012-05-19 ENCOUNTER — Other Ambulatory Visit: Payer: Self-pay | Admitting: Internal Medicine

## 2012-05-19 ENCOUNTER — Ambulatory Visit: Payer: Self-pay | Admitting: Internal Medicine

## 2012-05-19 DIAGNOSIS — I4891 Unspecified atrial fibrillation: Secondary | ICD-10-CM

## 2012-05-19 LAB — POCT INR: INR: 2.4

## 2012-05-26 ENCOUNTER — Ambulatory Visit: Payer: Self-pay | Admitting: Cardiovascular Disease

## 2012-05-26 DIAGNOSIS — I4891 Unspecified atrial fibrillation: Secondary | ICD-10-CM

## 2012-05-27 ENCOUNTER — Ambulatory Visit (INDEPENDENT_AMBULATORY_CARE_PROVIDER_SITE_OTHER): Payer: Medicare Other | Admitting: Internal Medicine

## 2012-05-27 ENCOUNTER — Encounter: Payer: Self-pay | Admitting: Internal Medicine

## 2012-05-27 ENCOUNTER — Ambulatory Visit (INDEPENDENT_AMBULATORY_CARE_PROVIDER_SITE_OTHER)
Admission: RE | Admit: 2012-05-27 | Discharge: 2012-05-27 | Disposition: A | Discharge status: Home / Self Care | Payer: Medicare Other | Source: Ambulatory Visit | Attending: Internal Medicine | Admitting: Internal Medicine

## 2012-05-27 VITALS — BP 112/60 | HR 66 | Ht 62.0 in | Wt 126.6 lb

## 2012-05-27 DIAGNOSIS — R0602 Shortness of breath: Secondary | ICD-10-CM

## 2012-05-27 DIAGNOSIS — J449 Chronic obstructive pulmonary disease, unspecified: Secondary | ICD-10-CM

## 2012-05-27 DIAGNOSIS — J4489 Other specified chronic obstructive pulmonary disease: Secondary | ICD-10-CM

## 2012-05-27 DIAGNOSIS — J189 Pneumonia, unspecified organism: Secondary | ICD-10-CM

## 2012-05-27 DIAGNOSIS — J441 Chronic obstructive pulmonary disease with (acute) exacerbation: Secondary | ICD-10-CM

## 2012-05-27 NOTE — Patient Instructions (Addendum)
Ok to use Mucinex dose 1200 to 1800 mg total daily dose as needed to thin mucus  Order- CXR  Dx COPD  Look for ways to keep pressure off your elbows.

## 2012-05-27 NOTE — Progress Notes (Signed)
Patient ID: [name], female    DOB: 12-07-1923, 78 y.o.   MRN: [id number]  HPI  07/23/2011-78 yoF former smoker followed for COPD, chronic hypoxic respiratory failure, complicated by atrial fibrillation, valvular heart disease and intermittent hemoptysis. She blames rain and pollen this week for nasal congestion. Minor epistaxis despite saline spray. Short of breath with her reading but that's not really different. Coughing a little scant yellow sputum, not bad. This happened about the time she changed to generic Singulair so she's not sure if it's a medication effect but does not feel as if she has cold. Continues oxygen at 3-3.5 L.  09/25/11- 78 yoF former smoker followed for COPD, chronic hypoxic respiratory failure, complicated by atrial fibrillation, valvular heart disease and intermittent hemoptysis Post hospital- 11/10-11/16/12- she was discharged with diagnoses COPD with exacerbation, acute on chronic respiratory failure on chronic oxygen, presumed community-acquired pneumonia, acute diastolic congestive heart failure, atrial fibrillation, history of aortic valve replacement, hypertension, history of coronary artery disease. She had had some blood in her sputum again. Chest x-ray had shown previous CABG and a right chest wall pacemaker, normal heart size, peripheral opacity within the right mid lung which was new with left pleural effusion and left lower lobe airspace consolidation. The concern was for multifocal pneumonia. She still feels especially unsteady if she tries to walk with her walker. There still is some blood streak in her sputum after treatment with broad-spectrum antibiotics in hospital. She expresses strong preference for brand-name Xopenex nebulizer solution rather than generic which she does not consider is effective.   10/25/11- 78 yoF former smoker followed for COPD, chronic hypoxic respiratory failure, complicated by atrial fibrillation, valvular heart disease and  intermittent hemoptysis After last hosp, she spent 4 weeks in rehab SNF. Feels much stronger. Some edema of feet. Last CT had indicated chronic pneumonia/ infiltrates- reviewed with her.  CT chest 09/25/11- IMPRESSION:  1. There are bilateral areas of peripheral and subpleural  consolidation within the right upper lobe, right middle lobe and  posterior medial left lung base. The appearance is nonspecific  favoring inflammatory or infectious etiologies. Underlying  malignancy cannot be excluded and interval follow-up is can be  necessary to ensure complete resolution. Recommend follow-up  imaging and 3-6 weeks after appropriate antibiotic therapy. The  study of choice would be a noncontrast CT of the chest.  2. Advanced emphysema.  Original Report Authenticated By: Rosealee Albee, M.D.   12/06/11- 78 yoF former smoker followed for COPD, chronic hypoxic respiratory failure, complicated by atrial fibrillation, valvular heart disease and intermittent hemoptysis   Daughter here She has not felt particularly in the last several days. We had sent Augmentin January 29 and she has taken about 4 doses. She is blowing her nose a lot, mostly clear mucus. Cough productive of yellow sputum now turning darker. Thinks she might have had some fever this morning with no chills. Has felt weak, somewhat nauseated. Describes a sharp pain intermittently in the left lower anterolateral chest and left upper quadrant of the abdomen, related to deep breath and movement but not to meals.  12/31/11  78 yoF former smoker followed for COPD, chronic hypoxic respiratory failure, complicated by atrial fibrillation/ coumadin, valvular heart disease and intermittent hemoptysis   Daughters here Post hosp visit 12/10/11- 12/17/11- for AECOPD, Rx'd Vanc/ Zosyn and discharged on Augmentin ending 12/19/11. Denies fever now. Cough productive sticky green to yellow sputum with no blood. Denies chest pain. Doing physical therapy At [facility]  Burn. Using VEST 3x/day, Flutter device. These do help clear secretions. Continues Xopenex nebulizer An inhaler with Dulera 200 and Spiriva. She would like to continue allergy shots but is in a nursing facility now for rehabilitation after her pneumonia.  03/24/12- 78 yoF former smoker followed for COPD, chronic hypoxic respiratory failure, complicated by atrial fibrillation/ coumadin, valvular heart disease and intermittent hemoptysis   Daughters here  Pt states increased sob,wheezing, productive cough with increase activity Now working with home hospice. Alprazolam helps her sleep. Prednisone 10 mg daily maintenance helps stabilize her breathing. Medrol 4 mg was not enough. We compared methylprednisolone with prednisone and discussed long-term side effects.  She believes allergy vaccine has made a difference for her even though she is not outside very much and is now quite elderly. I am less impressed but she believes in it strongly and wants to restart. We discussed whether the hospice nurse could give her shots at home. She has brought this issue up several times.  05/27/12- 78 yoF former smoker followed for COPD, chronic hypoxic respiratory failure, complicated by atrial fibrillation/ coumadin, valvular heart disease and intermittent hemoptysis   Daughter here Post hospital followup-05/04/2012 to 05/10/2012: Summary reviewed with her. DC diagnosis community-acquired pneumonia, hemoptysis, atrial fibrillation, diastolic CHF.CAP/ pseudomonas Rxd zosyn, then 5 days of Cipro.  Pt states since coming out of hospital breathing has gotten better .some wheezing,sob.sometimes  has a productive cough .Pt feels allergy injection is helping her. Says she feels the best that she has in a long time. COPD assessment test (CAT) 31/40 CXR- 05/04/12- images reviewed IMPRESSION:  1. Bilateral airspace opacities which may represent multifocal  infection.  2. Suspect left pleural effusion.  Original Report  Authenticated By: Rosealee Albee, M.D.   ROS-see HPI Constitutional:   No-   weight loss, night sweats,  fevers, chills,  +fatigue, lassitude. HEENT:   No-  headaches, difficulty swallowing, tooth/dental problems, sore throat, +episodic epistaxis.      No-  sneezing, itching, ear ache,   +nasal congestion, post nasal drip,  CV:  + No acute chest pain,  No-orthopnea, PND, swelling in lower extremities, anasarca, dizziness, palpitations Resp: + shortness of breath with exertion or at rest.              Little productive cough,  No non-productive cough,  No- coughing up of blood.              No- change in color of mucus.  No- wheezing.   Skin: No-   rash or lesions. GI:  No-   heartburn, indigestion, abdominal pain, nausea, vomiting,  GU:  MS:  No- acute  joint pain or swelling.   Neuro-     notices numbness in fingers-C7-8 distribution Psych:  No- change in mood or affect. + depression or anxiety.  No memory loss.  Objective:   General- Alert, Oriented, Affect-appropriate, Distress- none acute   O2 3 L/M, wheelchair. Talkative. Very sweet/very frail. Skin- rash-none, lesions- none, excoriation- none Lymphadenopathy- none Head- atraumatic            Eyes- Gross vision intact, PERRLA, conjunctivae clear secretions            Ears- Hearing, canals-normal for age            Nose- Clear, no-Septal dev, mucus, polyps, erosion, perforation.              Throat- Mallampati II-III , mucosa clear , drainage- none, tonsils- atrophic Neck- flexible , trachea  midline, no stridor , thyroid nl, carotid no bruit Chest - symmetrical excursion , unlabored           Heart/CV- RRR , 2-3/6 systolic LSB murmur , no gallop  , no rub, nl s1 s2                           - JVD- 1cm , edema- trace, stasis changes- none, varices- none           Lung- bilateral wheeze, harsh cough , dullness-none, rub- none,  Bilateral mild rhonchi           Chest wall- not tender, Pacemaker. Abd- tender-no, distended-no, bowel  sounds-present, HSM- no Br/ Gen/ Rectal- Not done, not indicated Extrem- cyanosis- none, clubbing, none, atrophy- none, strength- nl for her Neuro- grossly intact to observation

## 2012-05-28 NOTE — Progress Notes (Signed)
Quick Note:  Called and spoke with patient regarding CXR reults per Dr. Maple Hudson as listed below. Patient verbalized understanding and had no further questions or concerns at this time. ______

## 2012-05-30 ENCOUNTER — Other Ambulatory Visit: Payer: Self-pay | Admitting: Internal Medicine

## 2012-05-31 ENCOUNTER — Encounter: Payer: Self-pay | Admitting: Internal Medicine

## 2012-05-31 NOTE — Assessment & Plan Note (Signed)
Want to see pulmonary infiltrates resolve. Some of this may be scarring and edema Plan-chest x-ray, Mucinex

## 2012-05-31 NOTE — Assessment & Plan Note (Signed)
Subjectively improved but remains dependent on continuous oxygen.

## 2012-05-31 NOTE — Assessment & Plan Note (Signed)
Multifactorial-COPD, chronic pneumonia, aortic stenosis, and debilitation. Chronic hypoxic respiratory failure with continuous oxygen required.

## 2012-06-09 ENCOUNTER — Ambulatory Visit: Payer: Self-pay | Admitting: Pharmacist

## 2012-06-09 DIAGNOSIS — I4891 Unspecified atrial fibrillation: Secondary | ICD-10-CM

## 2012-06-09 LAB — POCT INR: INR: 2.1

## 2012-06-23 ENCOUNTER — Telehealth: Payer: Self-pay | Admitting: Pulmonary Disease

## 2012-06-23 ENCOUNTER — Ambulatory Visit: Payer: Self-pay | Admitting: Cardiovascular Disease

## 2012-06-23 DIAGNOSIS — I4891 Unspecified atrial fibrillation: Secondary | ICD-10-CM

## 2012-06-23 NOTE — Telephone Encounter (Signed)
I spoke with [name] and she was calling to inform Dr. Maple Hudson that over the weekend [name] was coughing up bloody sputum, sats were 93%, and INR 1.7. [name] reported to [name] that she is feeling much better today. She is coughing up less bloody sputum, oxygen level is at 98% and her INR is 1.8. Per [name] she believes the bloody sputum is coming from where [name] is coughing so much she busted a blood vessel in her lungs. Per [name] [name] refuses to take cough medications bc she stated when she did 1 time before it stopped her cough completely and did not feel like that was a good thing for her. [name] was not calling for any recs on [name] since she is doing better. Will forward to Dr. Maple Hudson so he is aware

## 2012-06-24 ENCOUNTER — Telehealth: Payer: Self-pay | Admitting: Internal Medicine

## 2012-06-24 NOTE — Telephone Encounter (Signed)
Caller: Kathy/Other; Patient Name: [name]; PCP: [name]; Best Callback Phone Number: [phone].  Right 4th and 5th fingers are getting numb.  Patient reports she has started dropping things as a result of the numbness/tingling.  Advised see in 72 hours per Numbness or Tingling protocol.  Caller declined appointment with Dr. Alwyn Ren on 06/25/12 due to conflict with home health.  She also declined appointment on 8/22 due to conflict with home health.  She states she will call on 8/21 to schedule the appointment.

## 2012-06-26 ENCOUNTER — Encounter: Payer: Medicare Other | Admitting: *Deleted

## 2012-06-27 ENCOUNTER — Telehealth: Payer: Self-pay | Admitting: Internal Medicine

## 2012-06-27 NOTE — Telephone Encounter (Signed)
Called refill to pharmacy-left on voicemail.

## 2012-06-27 NOTE — Telephone Encounter (Signed)
Per CY-okay to refill as requested. 

## 2012-06-27 NOTE — Telephone Encounter (Signed)
[facility] [address] REQUESTING HYDROCODONE-CHLORPHENIRAM SUSP >< take 1 tespoonful twice a day as needed for cough  240 ml X1 Allergies  Allergen Reactions  . Cephalexin     Unsure as to reaction.  ? Hives with Cephalexin; "I can take Penicillin"  . Atorvastatin     Forgetfulness   . Azithromycin Nausea And Vomiting  . Codeine Nausea And Vomiting  . Crestor (Rosuvastatin Calcium)     Forgetfulness    . Estradiol     Unknown reaction  . Hydrocortisone Hives    Flea bites were mistaken as rash from hydrocortisone  . Lovastatin Other (See Comments)    forgetfullness,  . Moxifloxacin     Unknown reaction  . Sulfonamide Derivatives     itching   Dr Maple Hudson please advise  Thank you

## 2012-06-30 ENCOUNTER — Encounter: Payer: Self-pay | Admitting: Internal Medicine

## 2012-06-30 ENCOUNTER — Ambulatory Visit (INDEPENDENT_AMBULATORY_CARE_PROVIDER_SITE_OTHER): Payer: Medicare Other | Admitting: Internal Medicine

## 2012-06-30 VITALS — BP 120/72 | HR 69 | Temp 97.9°F | Wt 129.4 lb

## 2012-06-30 DIAGNOSIS — M5412 Radiculopathy, cervical region: Secondary | ICD-10-CM

## 2012-06-30 NOTE — Progress Notes (Signed)
  Subjective:    Patient ID: [name], female    DOB: 10-14-1924, 78 y.o.   MRN: [id number]  HPI For the last 6 weeks she's noticed tingling in the fourth and fifth digits of the right hand and the lateral aspect of the right hand. There was no trauma or injury prior to the onset of symptoms. There is no other significant limb numbness, tingling, or weakness. There is variable in intensity to the tingling. The symptoms improve when she sits on her hand.  She is noted lateral deviation of the fifth right digit since the tingling started. She is unable to approximate the fifth digit, she flexes the finger.  She does have a past history of cervical degenerative disc disease      Review of Systems  She did have a left olecranon effusion in October of 2012. This was treated by the orthopedist. There was no associated abscess. Significantly she was in rehabilitation @ a nursing facility for the months of November-December, 2012 and again in February to April of this year. This was to recover from recurrent pneumonia. She's had 5 episodes of pneumonia since November 2012.     Objective:   Physical Exam  She is alert and interactive  Thyroid reveals no enlargement or nodularity  Neck ROM excellent  She has no lymphadenopathy about the neck, axilla, or epitrochlear areas  Radial artery pulses are intact and equal  There is demonstrable weakness of the fifth right digit. There is decreased flexion of the finger.  Deep tendon reflexes are normal at the biceps        Assessment & Plan:  #1 tingling and weakness in the right hand involving the ulnar aspect of the hand and the fourth and fifth digits.C7-8 radiculopathy vs ulnar nerve process. EMG/NCT may be necessary  Plan: Hand Surgery  assessment of these findings.

## 2012-06-30 NOTE — Patient Instructions (Addendum)
Review and correct the record as indicated. Please share record with all medical staff seen.  

## 2012-07-01 ENCOUNTER — Encounter: Payer: Self-pay | Admitting: *Deleted

## 2012-07-09 ENCOUNTER — Other Ambulatory Visit: Payer: Self-pay | Admitting: Cardiovascular Disease

## 2012-07-10 ENCOUNTER — Ambulatory Visit: Payer: Self-pay | Admitting: Cardiovascular Disease

## 2012-07-10 DIAGNOSIS — I4891 Unspecified atrial fibrillation: Secondary | ICD-10-CM

## 2012-07-10 NOTE — Telephone Encounter (Signed)
Fax Received. Refill Completed. [name] (R.M.A)   

## 2012-07-11 ENCOUNTER — Encounter: Payer: Self-pay | Admitting: Internal Medicine

## 2012-07-11 ENCOUNTER — Ambulatory Visit (INDEPENDENT_AMBULATORY_CARE_PROVIDER_SITE_OTHER): Payer: Medicare Other | Admitting: *Deleted

## 2012-07-11 DIAGNOSIS — I4891 Unspecified atrial fibrillation: Secondary | ICD-10-CM

## 2012-07-14 LAB — REMOTE PACEMAKER DEVICE
AL IMPEDENCE PM: 504 Ohm
ATRIAL PACING PM: 64.75
BAMS-0001: 170 {beats}/min
RV LEAD IMPEDENCE PM: 520 Ohm
VENTRICULAR PACING PM: 20.73

## 2012-07-17 ENCOUNTER — Telehealth: Payer: Self-pay | Admitting: Internal Medicine

## 2012-07-17 NOTE — Telephone Encounter (Signed)
Spoke with [name]- he states that this is fine to wait until appt. She is welcome to get sooner if she wishes, but no problem with her waiting until planned ov. Spoke with [name] and notified of this and she verbalized understanding, states nothing further needed.

## 2012-07-17 NOTE — Telephone Encounter (Signed)
Returning call can be reached at [phone].[name]

## 2012-07-17 NOTE — Telephone Encounter (Signed)
lmomtcb x1 

## 2012-07-29 ENCOUNTER — Encounter: Payer: Self-pay | Admitting: Internal Medicine

## 2012-07-29 ENCOUNTER — Ambulatory Visit (INDEPENDENT_AMBULATORY_CARE_PROVIDER_SITE_OTHER): Payer: Medicare Other | Admitting: Internal Medicine

## 2012-07-29 VITALS — BP 118/60 | HR 65 | Ht 62.0 in | Wt 129.4 lb

## 2012-07-29 DIAGNOSIS — J4489 Other specified chronic obstructive pulmonary disease: Secondary | ICD-10-CM

## 2012-07-29 DIAGNOSIS — J449 Chronic obstructive pulmonary disease, unspecified: Secondary | ICD-10-CM

## 2012-07-29 DIAGNOSIS — J189 Pneumonia, unspecified organism: Secondary | ICD-10-CM

## 2012-07-29 DIAGNOSIS — J441 Chronic obstructive pulmonary disease with (acute) exacerbation: Secondary | ICD-10-CM

## 2012-07-29 NOTE — Patient Instructions (Addendum)
Ok to try reducing VEST pressure from 8 to 6, and either continue the duration at 30 minutes, or reduce to 20 minutes, twice daily.  Try reducing daily maintenance prednisone to 6 mg daily ( 1.5 x 4 mg tab)  Get Flu vax as soon as you can.

## 2012-07-29 NOTE — Progress Notes (Signed)
Patient ID: [name], female    DOB: 12-07-1923, 78 y.o.   MRN: [id number]  HPI  07/23/2011-86 yoF former smoker followed for COPD, chronic hypoxic respiratory failure, complicated by atrial fibrillation, valvular heart disease and intermittent hemoptysis. She blames rain and pollen this week for nasal congestion. Minor epistaxis despite saline spray. Short of breath with her reading but that's not really different. Coughing a little scant yellow sputum, not bad. This happened about the time she changed to generic Singulair so she's not sure if it's a medication effect but does not feel as if she has cold. Continues oxygen at 3-3.5 L.  09/25/11- 86 yoF former smoker followed for COPD, chronic hypoxic respiratory failure, complicated by atrial fibrillation, valvular heart disease and intermittent hemoptysis Post hospital- 11/10-11/16/12- she was discharged with diagnoses COPD with exacerbation, acute on chronic respiratory failure on chronic oxygen, presumed community-acquired pneumonia, acute diastolic congestive heart failure, atrial fibrillation, history of aortic valve replacement, hypertension, history of coronary artery disease. She had had some blood in her sputum again. Chest x-ray had shown previous CABG and a right chest wall pacemaker, normal heart size, peripheral opacity within the right mid lung which was new with left pleural effusion and left lower lobe airspace consolidation. The concern was for multifocal pneumonia. She still feels especially unsteady if she tries to walk with her walker. There still is some blood streak in her sputum after treatment with broad-spectrum antibiotics in hospital. She expresses strong preference for brand-name Xopenex nebulizer solution rather than generic which she does not consider is effective.   10/25/11- 63 yoF former smoker followed for COPD, chronic hypoxic respiratory failure, complicated by atrial fibrillation, valvular heart disease and  intermittent hemoptysis After last hosp, she spent 4 weeks in rehab SNF. Feels much stronger. Some edema of feet. Last CT had indicated chronic pneumonia/ infiltrates- reviewed with her.  CT chest 09/25/11- IMPRESSION:  1. There are bilateral areas of peripheral and subpleural  consolidation within the right upper lobe, right middle lobe and  posterior medial left lung base. The appearance is nonspecific  favoring inflammatory or infectious etiologies. Underlying  malignancy cannot be excluded and interval follow-up is can be  necessary to ensure complete resolution. Recommend follow-up  imaging and 3-6 weeks after appropriate antibiotic therapy. The  study of choice would be a noncontrast CT of the chest.  2. Advanced emphysema.  Original Report Authenticated By: Rosealee Albee, M.D.   12/06/11- 36 yoF former smoker followed for COPD, chronic hypoxic respiratory failure, complicated by atrial fibrillation, valvular heart disease and intermittent hemoptysis   Daughter here She has not felt particularly in the last several days. We had sent Augmentin January 29 and she has taken about 4 doses. She is blowing her nose a lot, mostly clear mucus. Cough productive of yellow sputum now turning darker. Thinks she might have had some fever this morning with no chills. Has felt weak, somewhat nauseated. Describes a sharp pain intermittently in the left lower anterolateral chest and left upper quadrant of the abdomen, related to deep breath and movement but not to meals.  12/31/11  43 yoF former smoker followed for COPD, chronic hypoxic respiratory failure, complicated by atrial fibrillation/ coumadin, valvular heart disease and intermittent hemoptysis   Daughters here Post hosp visit 12/10/11- 12/17/11- for AECOPD, Rx'd Vanc/ Zosyn and discharged on Augmentin ending 12/19/11. Denies fever now. Cough productive sticky green to yellow sputum with no blood. Denies chest pain. Doing physical therapy At [facility]  [place]. Using VEST 3x/day, Flutter device. These do help clear secretions. Continues Xopenex nebulizer An inhaler with Dulera 200 and Spiriva. She would like to continue allergy shots but is in a nursing facility now for rehabilitation after her pneumonia.  03/24/12- 86 yoF former smoker followed for COPD, chronic hypoxic respiratory failure, complicated by atrial fibrillation/ coumadin, valvular heart disease and intermittent hemoptysis   Daughters here  Pt states increased sob,wheezing, productive cough with increase activity Now working with home hospice. Alprazolam helps her sleep. Prednisone 10 mg daily maintenance helps stabilize her breathing. Medrol 4 mg was not enough. We compared methylprednisolone with prednisone and discussed long-term side effects.  She believes allergy vaccine has made a difference for her even though she is not outside very much and is now quite elderly. I am less impressed but she believes in it strongly and wants to restart. We discussed whether the hospice nurse could give her shots at home. She has brought this issue up several times.  05/27/12- 86 yoF former smoker followed for COPD, chronic hypoxic respiratory failure, complicated by atrial fibrillation/ coumadin, valvular heart disease and intermittent hemoptysis   Daughter here Post hospital followup-05/04/2012 to 05/10/2012: Summary reviewed with her. DC diagnosis community-acquired pneumonia, hemoptysis, atrial fibrillation, diastolic CHF.CAP/ pseudomonas Rxd zosyn, then 5 days of Cipro.  Pt states since coming out of hospital breathing has gotten better .some wheezing,sob.sometimes  has a productive cough .Pt feels allergy injection is helping her. Says she feels the best that she has in a long time. COPD assessment test (CAT) 31/40 CXR- 05/04/12- images reviewed IMPRESSION:  1. Bilateral airspace opacities which may represent multifocal  infection.  2. Suspect left pleural effusion.  Original Report  Authenticated By: Rosealee Albee, M.D.   07/29/12- 9 yoF former smoker followed for COPD, chronic hypoxic respiratory failure, complicated by atrial fibrillation/ coumadin, valvular heart disease and intermittent hemoptysis   Daughter here States when resting breathing is good but when active still gets SOB. Also, has dizzy spells at any given time(unknown cause); Pt states she ia hving blood in her sputum and would like to discuss VEST pressure and how often/peroids of times to use the vest. If still notices occasional bloodstained sputum and she continues Coumadin. This is not new or progressive but is "more than a little, sometimes". She stopped using the Vest device for one week and bleeding slowed but when she resumed the vibration treatments, bleeding picked up again. The device does help her clear mucus from her chest. She is back living at home again with family checking closely. COPD assessment test (CAT) score 30/40 CXR 05/28/12 IMPRESSION:  COPD. Improving bibasilar infiltrates.  Original Report Authenticated By: Camelia Phenes, M.D.   ROS-see HPI Constitutional:   No-   weight loss, night sweats,  fevers, chills,  +fatigue, lassitude. HEENT:   No-  headaches, difficulty swallowing, tooth/dental problems, sore throat, +episodic epistaxis.      No-  sneezing, itching, ear ache,   +nasal congestion, post nasal drip,  CV:  + No acute chest pain,  No-orthopnea, PND, swelling in lower extremities, anasarca, dizziness, palpitations Resp: + shortness of breath with exertion or at rest.              + productive cough,  No non-productive cough,  + coughing up of blood.              No- change in color of mucus.  No- wheezing.   Skin: No-   rash or  lesions. GI:  No-   heartburn, indigestion, abdominal pain, nausea, vomiting,  GU:  MS:  No- acute  joint pain or swelling.   Neuro-     notices numbness in fingers-C7-8 distribution Psych:  No- change in mood or affect. + depression or anxiety.   No memory loss.  Objective:   BP 118/60  Pulse 65  Ht 5\' 2"  (1.575 m)  Wt 129 lb 6.4 oz (58.695 kg)  BMI 23.67 kg/m2  SpO2 97%  General- Alert, Oriented, Affect-appropriate, Distress- none acute   O2 3 L/M, wheelchair. Talkative. Very sweet/very frail. Skin- rash-none, lesions- none, excoriation- none Lymphadenopathy- none Head- atraumatic            Eyes- Gross vision intact, PERRLA, conjunctivae clear secretions            Ears- Hearing, canals-normal for age            Nose- Clear, no-Septal dev, mucus, polyps, erosion, perforation.              Throat- Mallampati II-III , mucosa clear , drainage- none, tonsils- atrophic Neck- flexible , trachea midline, no stridor , thyroid nl, carotid no bruit Chest - symmetrical excursion , unlabored           Heart/CV- RRR , +2-3/6 systolic LSB murmur , no gallop  , no rub, nl s1 s2                           - JVD- 1cm , edema- trace, stasis changes- none, varices- none           Lung-  dullness-none, rub- none,  +Bilateral mild rhonchi           Chest wall- not tender, Pacemaker. Abd-  Br/ Gen/ Rectal- Not done, not indicated Extrem- cyanosis- none, clubbing, none, atrophy- none, strength- nl for her Neuro- grossly intact to observation

## 2012-07-31 ENCOUNTER — Ambulatory Visit: Payer: Self-pay | Admitting: Cardiology

## 2012-07-31 DIAGNOSIS — I4891 Unspecified atrial fibrillation: Secondary | ICD-10-CM

## 2012-07-31 LAB — POCT INR: INR: 2.2

## 2012-08-02 ENCOUNTER — Other Ambulatory Visit: Payer: Self-pay | Admitting: Internal Medicine

## 2012-08-06 ENCOUNTER — Other Ambulatory Visit: Payer: Self-pay | Admitting: *Deleted

## 2012-08-06 MED ORDER — DIGOXIN 125 MCG PO TABS: 125.0000 ug | ORAL_TABLET | Freq: Every evening | ORAL | Status: DC

## 2012-08-06 NOTE — Assessment & Plan Note (Signed)
Plan-reduce vibratory pressure of her Vest from 8-6 for 20 minutes twice daily as a compromise increased use causes more bleeding but improves airway clearance.

## 2012-08-06 NOTE — Telephone Encounter (Signed)
Fax Received. Refill Completed. Aj Crunkleton Chowoe (R.M.A)   

## 2012-08-06 NOTE — Assessment & Plan Note (Signed)
Plan-reduce prednisolone to 6 mg daily (1.5 x 4 mg) Flu vaccine when available

## 2012-08-06 NOTE — Assessment & Plan Note (Signed)
Slowly clearing infiltrates

## 2012-08-07 ENCOUNTER — Other Ambulatory Visit: Payer: Self-pay | Admitting: Internal Medicine

## 2012-08-07 ENCOUNTER — Telehealth: Payer: Self-pay | Admitting: Internal Medicine

## 2012-08-07 MED ORDER — MOMETASONE FURO-FORMOTEROL FUM 200-5 MCG/ACT IN AERO: 2.0000 | INHALATION_SPRAY | Freq: Two times a day (BID) | RESPIRATORY_TRACT | Status: AC | PRN

## 2012-08-07 NOTE — Telephone Encounter (Signed)
lmomtcb for wendy from hospice to call back.

## 2012-08-07 NOTE — Telephone Encounter (Signed)
[name] called back and she stated that the pt will need refill of the dulera and this has been sent in to the pharmacy.  [name] stated that the pt recently was decreased on the  Prednisone from 8 mg to 6 mg and now pt is c/o increase SOB but sats are staying at 95% or better.  Pt is wanting to know if she needs to increase the pred back to 8 mg daily?  CY please advise. Thanks  Allergies  Allergen Reactions  . Cephalexin     Unsure as to reaction.  ? Hives with Cephalexin; "I can take Penicillin"  . Atorvastatin     Forgetfulness   . Azithromycin Nausea And Vomiting  . Codeine Nausea And Vomiting  . Crestor (Rosuvastatin Calcium)     Forgetfulness    . Estradiol     Unknown reaction  . Hydrocortisone Hives    Flea bites were mistaken as rash from hydrocortisone  . Lovastatin Other (See Comments)    forgetfullness,  . Moxifloxacin     Unknown reaction  . Sulfonamide Derivatives     itching

## 2012-08-07 NOTE — Telephone Encounter (Signed)
Per CY-try alternate 6 mg with 8 mg every other day.

## 2012-08-07 NOTE — Telephone Encounter (Signed)
Called and spoke with wendy and she is aware of CY recs of the prednisone and wendy will call and make the pt aware.

## 2012-08-19 ENCOUNTER — Encounter: Payer: Self-pay | Admitting: *Deleted

## 2012-08-26 ENCOUNTER — Telehealth: Payer: Self-pay | Admitting: Internal Medicine

## 2012-08-26 ENCOUNTER — Ambulatory Visit: Payer: Self-pay | Admitting: Cardiology

## 2012-08-26 DIAGNOSIS — I4891 Unspecified atrial fibrillation: Secondary | ICD-10-CM

## 2012-08-26 MED ORDER — AMOXICILLIN-POT CLAVULANATE 500-125 MG PO TABS: 1.0000 | ORAL_TABLET | Freq: Two times a day (BID) | ORAL | Status: DC

## 2012-08-26 NOTE — Telephone Encounter (Signed)
Left message on machine for patient Hospice caregiver (wendy) to return our call.  Per CY, Augmentin 500mg  #20  1 BID  [facility] to [facility] [address]   [name] needs to be made aware that this so that the medication may be picked up.

## 2012-08-26 NOTE — Telephone Encounter (Signed)
I spoke with [name] and she states she can hear pt audibly wheeze, c/o increase weakness, cough w/ increase bloody sputum, and Rhonchi in RLL. Her INR is normal 2.2. She is wanting to know if pt should be put on ABX. No f/c/s/n/v. Please advise Dr. Maple Hudson thanks  Allergies  Allergen Reactions  . Cephalexin     Unsure as to reaction.  ? Hives with Cephalexin; "I can take Penicillin"  . Atorvastatin     Forgetfulness   . Azithromycin Nausea And Vomiting  . Codeine Nausea And Vomiting  . Crestor (Rosuvastatin Calcium)     Forgetfulness    . Estradiol     Unknown reaction  . Hydrocortisone Hives    Flea bites were mistaken as rash from hydrocortisone  . Lovastatin Other (See Comments)    forgetfullness,  . Moxifloxacin     Unknown reaction  . Sulfonamide Derivatives     itching

## 2012-08-26 NOTE — Telephone Encounter (Signed)
Spoke with [name] and notified that the rx for augmentin was sent to pharm. She verbalized understanding and states nothing further needed.

## 2012-09-01 ENCOUNTER — Inpatient Hospital Stay (HOSPITAL_COMMUNITY)
Admission: EM | Admit: 2012-09-01 | Discharge: 2012-09-08 | DRG: 177 | Disposition: A | Discharge status: Hospice | Attending: Internal Medicine | Admitting: Internal Medicine

## 2012-09-01 ENCOUNTER — Emergency Department (HOSPITAL_COMMUNITY)

## 2012-09-01 ENCOUNTER — Telehealth: Payer: Self-pay | Admitting: Internal Medicine

## 2012-09-01 ENCOUNTER — Encounter (HOSPITAL_COMMUNITY): Payer: Self-pay | Admitting: Emergency Medicine

## 2012-09-01 DIAGNOSIS — Z7901 Long term (current) use of anticoagulants: Secondary | ICD-10-CM

## 2012-09-01 DIAGNOSIS — I350 Nonrheumatic aortic (valve) stenosis: Secondary | ICD-10-CM

## 2012-09-01 DIAGNOSIS — R141 Gas pain: Secondary | ICD-10-CM

## 2012-09-01 DIAGNOSIS — IMO0002 Reserved for concepts with insufficient information to code with codable children: Secondary | ICD-10-CM

## 2012-09-01 DIAGNOSIS — M810 Age-related osteoporosis without current pathological fracture: Secondary | ICD-10-CM

## 2012-09-01 DIAGNOSIS — J209 Acute bronchitis, unspecified: Secondary | ICD-10-CM

## 2012-09-01 DIAGNOSIS — J449 Chronic obstructive pulmonary disease, unspecified: Secondary | ICD-10-CM

## 2012-09-01 DIAGNOSIS — K589 Irritable bowel syndrome without diarrhea: Secondary | ICD-10-CM

## 2012-09-01 DIAGNOSIS — J189 Pneumonia, unspecified organism: Secondary | ICD-10-CM

## 2012-09-01 DIAGNOSIS — M531 Cervicobrachial syndrome: Secondary | ICD-10-CM

## 2012-09-01 DIAGNOSIS — R209 Unspecified disturbances of skin sensation: Secondary | ICD-10-CM

## 2012-09-01 DIAGNOSIS — I5033 Acute on chronic diastolic (congestive) heart failure: Secondary | ICD-10-CM

## 2012-09-01 DIAGNOSIS — J9600 Acute respiratory failure, unspecified whether with hypoxia or hypercapnia: Secondary | ICD-10-CM

## 2012-09-01 DIAGNOSIS — R143 Flatulence: Secondary | ICD-10-CM

## 2012-09-01 DIAGNOSIS — K5289 Other specified noninfective gastroenteritis and colitis: Secondary | ICD-10-CM

## 2012-09-01 DIAGNOSIS — D649 Anemia, unspecified: Secondary | ICD-10-CM

## 2012-09-01 DIAGNOSIS — R5381 Other malaise: Secondary | ICD-10-CM

## 2012-09-01 DIAGNOSIS — K5900 Constipation, unspecified: Secondary | ICD-10-CM

## 2012-09-01 DIAGNOSIS — J441 Chronic obstructive pulmonary disease with (acute) exacerbation: Secondary | ICD-10-CM

## 2012-09-01 DIAGNOSIS — D72829 Elevated white blood cell count, unspecified: Secondary | ICD-10-CM

## 2012-09-01 DIAGNOSIS — R042 Hemoptysis: Secondary | ICD-10-CM

## 2012-09-01 DIAGNOSIS — J301 Allergic rhinitis due to pollen: Secondary | ICD-10-CM

## 2012-09-01 DIAGNOSIS — L608 Other nail disorders: Secondary | ICD-10-CM

## 2012-09-01 DIAGNOSIS — Z9981 Dependence on supplemental oxygen: Secondary | ICD-10-CM

## 2012-09-01 DIAGNOSIS — L299 Pruritus, unspecified: Secondary | ICD-10-CM

## 2012-09-01 DIAGNOSIS — Z950 Presence of cardiac pacemaker: Secondary | ICD-10-CM

## 2012-09-01 DIAGNOSIS — J4489 Other specified chronic obstructive pulmonary disease: Secondary | ICD-10-CM

## 2012-09-01 DIAGNOSIS — R42 Dizziness and giddiness: Secondary | ICD-10-CM

## 2012-09-01 DIAGNOSIS — I951 Orthostatic hypotension: Secondary | ICD-10-CM

## 2012-09-01 DIAGNOSIS — G4730 Sleep apnea, unspecified: Secondary | ICD-10-CM

## 2012-09-01 DIAGNOSIS — R0602 Shortness of breath: Secondary | ICD-10-CM

## 2012-09-01 DIAGNOSIS — I4891 Unspecified atrial fibrillation: Secondary | ICD-10-CM

## 2012-09-01 DIAGNOSIS — J151 Pneumonia due to Pseudomonas: Principal | ICD-10-CM

## 2012-09-01 DIAGNOSIS — J159 Unspecified bacterial pneumonia: Secondary | ICD-10-CM

## 2012-09-01 HISTORY — DX: Heart failure, unspecified: I50.9

## 2012-09-01 HISTORY — DX: Adverse effect of unspecified anesthetic, initial encounter: T41.45XA

## 2012-09-01 HISTORY — DX: Other complications of anesthesia, initial encounter: T88.59XA

## 2012-09-01 LAB — COMPREHENSIVE METABOLIC PANEL
ALT: 21 U/L (ref 0–35)
Alkaline Phosphatase: 48 U/L (ref 39–117)
CO2: 31 mEq/L (ref 19–32)
Calcium: 9.1 mg/dL (ref 8.4–10.5)
Chloride: 94 mEq/L — ABNORMAL LOW (ref 96–112)
GFR calc Af Amer: >90 mL/min (ref >90)
GFR calc non Af Amer: 84 mL/min — ABNORMAL LOW (ref >90)
Glucose, Bld: 109 mg/dL — ABNORMAL HIGH (ref 70–99)
Potassium: 4 mEq/L (ref 3.5–5.1)
Sodium: 133 mEq/L — ABNORMAL LOW (ref 135–145)
Total Bilirubin: 0.3 mg/dL (ref 0.3–1.2)

## 2012-09-01 LAB — CBC
Hemoglobin: 11.3 g/dL — ABNORMAL LOW (ref 12.0–15.0)
MCH: 26.7 pg (ref 26.0–34.0)
RBC: 4.23 MIL/uL (ref 3.87–5.11)
WBC: 10.9 10*3/uL — ABNORMAL HIGH (ref 4.0–10.5)

## 2012-09-01 LAB — DIGOXIN LEVEL: Digoxin Level: 0.5 ng/mL — ABNORMAL LOW (ref 0.8–2.0)

## 2012-09-01 MED ORDER — FERROUS SULFATE 325 (65 FE) MG PO TABS
325.0000 mg | ORAL_TABLET | Freq: Every day | ORAL | Status: DC
  Administered 2012-09-02 – 2012-09-08 (×7): 325 mg via ORAL
  Filled 2012-09-01 (×9): qty 1

## 2012-09-01 MED ORDER — DIGOXIN 125 MCG PO TABS
125.0000 ug | ORAL_TABLET | Freq: Every evening | ORAL | Status: DC
  Administered 2012-09-01 – 2012-09-07 (×7): 125 ug via ORAL
  Filled 2012-09-01 (×8): qty 1

## 2012-09-01 MED ORDER — HYDROCORTISONE SOD SUCCINATE 100 MG IJ SOLR
50.0000 mg | Freq: Three times a day (TID) | INTRAMUSCULAR | Status: AC
  Administered 2012-09-01 – 2012-09-02 (×3): 50 mg via INTRAVENOUS
  Filled 2012-09-01 (×3): qty 1

## 2012-09-01 MED ORDER — VITAMIN C 500 MG PO TABS
500.0000 mg | ORAL_TABLET | Freq: Every day | ORAL | Status: DC
  Administered 2012-09-02 – 2012-09-08 (×7): 500 mg via ORAL
  Filled 2012-09-01 (×8): qty 1

## 2012-09-01 MED ORDER — GUAIFENESIN 200 MG PO TABS: 600.0000 mg | ORAL_TABLET | Freq: Two times a day (BID) | ORAL | Status: DC

## 2012-09-01 MED ORDER — ALBUTEROL SULFATE (5 MG/ML) 0.5% IN NEBU
2.5000 mg | INHALATION_SOLUTION | Freq: Four times a day (QID) | RESPIRATORY_TRACT | Status: DC
  Administered 2012-09-01 – 2012-09-02 (×3): 2.5 mg via RESPIRATORY_TRACT
  Filled 2012-09-01 (×3): qty 0.5

## 2012-09-01 MED ORDER — SENNA 8.6 MG PO TABS
1.0000 | ORAL_TABLET | Freq: Every day | ORAL | Status: DC
  Administered 2012-09-01 – 2012-09-06 (×6): 8.6 mg via ORAL
  Filled 2012-09-01 (×6): qty 1

## 2012-09-01 MED ORDER — LEVALBUTEROL TARTRATE 45 MCG/ACT IN AERO
2.0000 | INHALATION_SPRAY | RESPIRATORY_TRACT | Status: DC | PRN
Start: 2012-09-01 — End: 2012-09-08
  Administered 2012-09-06 – 2012-09-07 (×5): 2 via RESPIRATORY_TRACT
  Filled 2012-09-01 (×2): qty 15

## 2012-09-01 MED ORDER — MAGIC MOUTHWASH
5.0000 mL | Freq: Four times a day (QID) | ORAL | Status: DC | PRN
  Administered 2012-09-01 – 2012-09-04 (×2): 5 mL via ORAL
  Filled 2012-09-01: qty 5

## 2012-09-01 MED ORDER — SODIUM CHLORIDE 0.9 % IJ SOLN
3.0000 mL | Freq: Two times a day (BID) | INTRAMUSCULAR | Status: DC
  Administered 2012-09-01 – 2012-09-07 (×10): 3 mL via INTRAVENOUS

## 2012-09-01 MED ORDER — MONTELUKAST SODIUM 10 MG PO TABS
10.0000 mg | ORAL_TABLET | Freq: Every day | ORAL | Status: DC
  Administered 2012-09-01 – 2012-09-07 (×7): 10 mg via ORAL
  Filled 2012-09-01 (×8): qty 1

## 2012-09-01 MED ORDER — DILTIAZEM HCL ER COATED BEADS 240 MG PO CP24
240.0000 mg | ORAL_CAPSULE | Freq: Every day | ORAL | Status: DC
  Administered 2012-09-02 – 2012-09-08 (×7): 240 mg via ORAL
  Filled 2012-09-01 (×7): qty 1

## 2012-09-01 MED ORDER — WARFARIN - PHARMACIST DOSING INPATIENT: Freq: Every day | Status: DC

## 2012-09-01 MED ORDER — SENNOSIDES 8.6 MG PO TABS: 1.0000 | ORAL_TABLET | Freq: Every evening | ORAL | Status: DC

## 2012-09-01 MED ORDER — TIOTROPIUM BROMIDE MONOHYDRATE 18 MCG IN CAPS
18.0000 ug | ORAL_CAPSULE | Freq: Every day | RESPIRATORY_TRACT | Status: DC
  Administered 2012-09-02 – 2012-09-08 (×7): 18 ug via RESPIRATORY_TRACT
  Filled 2012-09-01 (×2): qty 5

## 2012-09-01 MED ORDER — ALPRAZOLAM 0.25 MG PO TABS
0.2500 mg | ORAL_TABLET | Freq: Three times a day (TID) | ORAL | Status: DC | PRN
  Administered 2012-09-02 – 2012-09-07 (×7): 0.25 mg via ORAL
  Filled 2012-09-01 (×7): qty 1

## 2012-09-01 MED ORDER — DEXTROSE 5 % IV SOLN
1.0000 g | Freq: Once | INTRAVENOUS | Status: AC
  Administered 2012-09-01: 1 g via INTRAVENOUS
  Filled 2012-09-01: qty 10

## 2012-09-01 MED ORDER — GUAIFENESIN ER 600 MG PO TB12
600.0000 mg | ORAL_TABLET | Freq: Two times a day (BID) | ORAL | Status: DC
  Administered 2012-09-02 – 2012-09-08 (×14): 600 mg via ORAL
  Filled 2012-09-01 (×15): qty 1

## 2012-09-01 MED ORDER — GUAIFENESIN ER 1200 MG PO TB12: 600.0000 mg | ORAL_TABLET | Freq: Two times a day (BID) | ORAL | Status: DC

## 2012-09-01 MED ORDER — SERTRALINE HCL 50 MG PO TABS
50.0000 mg | ORAL_TABLET | Freq: Every day | ORAL | Status: DC
  Administered 2012-09-01 – 2012-09-08 (×8): 50 mg via ORAL
  Filled 2012-09-01 (×8): qty 1

## 2012-09-01 MED ORDER — MORPHINE SULFATE 2 MG/ML IJ SOLN
1.0000 mg | INTRAMUSCULAR | Status: DC | PRN
  Filled 2012-09-01: qty 1

## 2012-09-01 MED ORDER — ONDANSETRON HCL 4 MG/2ML IJ SOLN: 4.0000 mg | Freq: Four times a day (QID) | INTRAMUSCULAR | Status: DC | PRN

## 2012-09-01 MED ORDER — WARFARIN SODIUM 1 MG PO TABS
1.0000 mg | ORAL_TABLET | Freq: Once | ORAL | Status: AC
  Administered 2012-09-01: 1 mg via ORAL
  Filled 2012-09-01: qty 1

## 2012-09-01 MED ORDER — EZETIMIBE 10 MG PO TABS
10.0000 mg | ORAL_TABLET | Freq: Every evening | ORAL | Status: DC
  Administered 2012-09-01 – 2012-09-07 (×7): 10 mg via ORAL
  Filled 2012-09-01 (×8): qty 1

## 2012-09-01 MED ORDER — ONDANSETRON HCL 4 MG PO TABS: 4.0000 mg | ORAL_TABLET | Freq: Four times a day (QID) | ORAL | Status: DC | PRN

## 2012-09-01 MED ORDER — SODIUM CHLORIDE 0.9 % IJ SOLN: 3.0000 mL | INTRAMUSCULAR | Status: DC | PRN

## 2012-09-01 MED ORDER — VANCOMYCIN HCL 500 MG IV SOLR
500.0000 mg | Freq: Two times a day (BID) | INTRAVENOUS | Status: DC
  Administered 2012-09-01 – 2012-09-05 (×8): 500 mg via INTRAVENOUS
  Filled 2012-09-01 (×9): qty 500

## 2012-09-01 MED ORDER — CIPROFLOXACIN IN D5W 400 MG/200ML IV SOLN: 400.0000 mg | Freq: Two times a day (BID) | INTRAVENOUS | Status: DC

## 2012-09-01 MED ORDER — PANTOPRAZOLE SODIUM 40 MG PO TBEC
40.0000 mg | DELAYED_RELEASE_TABLET | Freq: Every day | ORAL | Status: DC
  Administered 2012-09-02 – 2012-09-08 (×7): 40 mg via ORAL
  Filled 2012-09-01 (×7): qty 1

## 2012-09-01 MED ORDER — SOTALOL HCL 80 MG PO TABS
80.0000 mg | ORAL_TABLET | Freq: Two times a day (BID) | ORAL | Status: DC
  Administered 2012-09-01 – 2012-09-08 (×14): 80 mg via ORAL
  Filled 2012-09-01 (×15): qty 1

## 2012-09-01 MED ORDER — PREDNISONE 20 MG PO TABS
20.0000 mg | ORAL_TABLET | Freq: Every day | ORAL | Status: DC
  Administered 2012-09-02: 20 mg via ORAL
  Filled 2012-09-01 (×4): qty 1

## 2012-09-01 MED ORDER — DEXTROSE 5 % IV SOLN
1.0000 g | Freq: Two times a day (BID) | INTRAVENOUS | Status: DC
  Administered 2012-09-01 – 2012-09-05 (×8): 1 g via INTRAVENOUS
  Filled 2012-09-01 (×10): qty 1

## 2012-09-01 MED ORDER — SODIUM CHLORIDE 0.9 % IV SOLN: 250.0000 mL | INTRAVENOUS | Status: DC | PRN

## 2012-09-01 MED ORDER — LEVOFLOXACIN IN D5W 500 MG/100ML IV SOLN
500.0000 mg | INTRAVENOUS | Status: DC
  Filled 2012-09-01: qty 100

## 2012-09-01 MED ORDER — SODIUM CHLORIDE 0.9 % IJ SOLN
3.0000 mL | Freq: Two times a day (BID) | INTRAMUSCULAR | Status: DC
  Administered 2012-09-05 – 2012-09-07 (×4): 3 mL via INTRAVENOUS
  Administered 2012-09-07: 21:00:00 via INTRAVENOUS

## 2012-09-01 MED ORDER — MOMETASONE FURO-FORMOTEROL FUM 200-5 MCG/ACT IN AERO
2.0000 | INHALATION_SPRAY | Freq: Two times a day (BID) | RESPIRATORY_TRACT | Status: DC | PRN
  Administered 2012-09-03 – 2012-09-04 (×2): 2 via RESPIRATORY_TRACT
  Filled 2012-09-01: qty 0.3

## 2012-09-01 NOTE — ED Notes (Signed)
ZOX:[id number]<AV> Expected date:09/01/12<BR> Expected time: 5:06 PM<BR> Means of arrival:Ambulance<BR> Comments:<BR> 78yo, resp. distress

## 2012-09-01 NOTE — Telephone Encounter (Signed)
Pt worsened per [name] w/ hospice (who just talked to pt's daughter). Says daughter wants pt to be seen due to low sats or feels pt needs to be admitted to hospital but wants to "get this done asap"- nurse req to speak to nurse in triage asap. [name]

## 2012-09-01 NOTE — Telephone Encounter (Signed)
Per phone msg from 08/26/12:  Nita Sells, CMA 08/26/2012 3:16 PM Signed  Left message on machine for patient Hospice caregiver (wendy) to return our call.  Per CY, Augmentin 500mg  #20 1 BID Sent to [facility] [address]  [name] needs to be made aware that this so that the medication may be picked up.   ---------  Called, spoke with [name], pt's hospice nurse, who states initially pt thought augmentin was helping however, pt now feels worse.  Per [name], pt has yellowish green sputum and hemoptysis is increasing.  Pt has rhonchi in right lower lobe and wheezing throughout (per [name], this is not pt's norm but has been for the "past week or so").  Also, states pt has a temp of 98.6 (pt reports this is a temp for her).  [name] states pt walked to bathroom this am with sats dropping in the mid 80's with 4 lpm o2 (not norm for pt) and took a little while to recover.  Per [name], pt feels she has pna and augmentin is not helping.  She is a full code -- they have exhausted all of their "home" options and requesting further recs from Dr. Maple Hudson.  Pls advise. Thank you.

## 2012-09-01 NOTE — H&P (Signed)
[facility] History and Physical  [name]:[id number] DOB: 1924/06/13    PCP:   [name], MD   Chief Complaint: shortness of breath and cough.  HPI: [name] is an 78 y.o. female with prior hx of tobacco use, COPD on home Oxygen, under hospice care with full code status, Hx of afib on chronic anticoagulation with INR 1.89 today, s/p pacer placement on pacerone and dig, Hx of AVR (pig), HTN, chronic steroid use being tapered to 6mg /day, presents to the ER with SOB, yellow sputum productive coughs and hemoptysis, but no chest pain or pleuritic pain for the past 5 days.  She has been feeling weak as well.  Evaluation in the ER included a CXR which showed a PNA, no leukocytosis with WBC 10.9K, and normal renal fx tests. Her Na is a little low at 133.  She is on lasix.  Hospitalist was asked to admit her for PNA.  Rewiew of Systems:  Constitutional: Negative for malaise, fever and chills. No significant weight loss or weight gain Eyes: Negative for eye pain, redness and discharge, diplopia, visual changes, or flashes of light. ENMT: Negative for ear pain, hoarseness, nasal congestion, sinus pressure and sore throat. No headaches; tinnitus, drooling, or problem swallowing. Cardiovascular: Negative for chest pain, palpitations, diaphoresis, dyspnea and peripheral edema. ; No orthopnea, PND Respiratory:  No pleuritic chestpain. Gastrointestinal: Negative for nausea, vomiting, diarrhea, constipation, abdominal pain, melena, blood in stool, hematemesis, jaundice and rectal bleeding.    Genitourinary: Negative for frequency, dysuria, incontinence,flank pain and hematuria; Musculoskeletal: Negative for back pain and neck pain. Negative for swelling and trauma.;  Skin: . Negative for pruritus, rash, abrasions, bruising and skin lesion.; ulcerations Neuro: Negative for headache, lightheadedness and neck stiffness. Negative for weakness, altered level of consciousness , altered  mental status, extremity weakness, burning feet, involuntary movement, seizure and syncope.  Psych: negative for anxiety, depression, insomnia, tearfulness, panic attacks, hallucinations, paranoia, suicidal or homicidal ideation    Past Medical History  Diagnosis Date  . Asthmatic bronchitis   . Allergic rhinitis   . Atrial fibrillation   . History of aortic valve replacement Dr. Tyrone Sage 2006    Has severe residual gradient and will be managed conservatively  . Sleep apnea   . Chronic obstructive asthma   . Osteoporosis   . Pruritic disorder   . Diverticulosis   . Hemorrhoids   . GERD (gastroesophageal reflux disease)   . IBS (irritable bowel syndrome)   . Pacemaker 2010  . Oxygen dependent   . Atrial flutter     on Betapace  . High risk medication use     on Betapace & Coumadin  . Chronic anticoagulation   . COPD (chronic obstructive pulmonary disease)   . Pneumonia   . Anemia   . Arthritis   . Hypertension   . Coronary artery disease   . Pneumonia 09/2011    X 10 Sep 2011- June  2013  . Complication of anesthesia     trouble waking up    Past Surgical History  Procedure Date  . Back surgery 1994  . Tonsillectomy   . Appendectomy   . Ganglion cyst excision   . Vocal cord polyps 1975 and 1984  . Aortic valve replacement 2006    Bioprosthetic  . Pacemaker insertion 2010  . Cardiac electrophysiology mapping and ablation   . Coronary artery bypass graft   . Insert / replace / remove pacemaker   . Eye surgery   .  Cardiac catheterization   . Coronary angioplasty   . Cardiac valve replacement     Medications:  HOME MEDS: Prior to Admission medications   Medication Sig Start Date End Date Taking? Authorizing Provider  ALPRAZolam (XANAX) 0.25 MG tablet Take 0.25 mg by mouth at bedtime as needed. For anxiety   Yes Historical Provider, MD  Alum & Mag Hydroxide-Simeth (MAGIC MOUTHWASH) SOLN Take 5 mLs by mouth 4 (four) times daily as needed. For mouth irritations     Yes Historical Provider, MD  amoxicillin-clavulanate (AUGMENTIN) 500-125 MG per tablet Take 1 tablet by mouth 2 (two) times daily. 08/26/12  Yes Waymon Budge, MD  Calcium Citrate-Vitamin D (CALCIUM CITRATE + D PO) Take 1 tablet by mouth daily.   Yes Historical Provider, MD  digoxin (LANOXIN) 0.125 MG tablet Take 1 tablet (125 mcg total) by mouth every evening. 08/06/12  Yes Vesta Mixer, MD  diltiazem (CARDIZEM CD) 240 MG 24 hr capsule Take 240 mg by mouth daily. 09/06/11  Yes Vesta Mixer, MD  EPINEPHrine (EPIPEN JR) 0.15 MG/0.3ML injection Inject 0.3 mLs (0.15 mg total) into the muscle as needed for anaphylaxis. 04/11/12 04/11/13 Yes Clinton D Young, MD  esomeprazole (NEXIUM) 40 MG capsule Take 40 mg by mouth daily before breakfast.   Yes Historical Provider, MD  ezetimibe (ZETIA) 10 MG tablet Take 10 mg by mouth every evening. 01/07/12  Yes Vesta Mixer, MD  ferrous sulfate 325 (65 FE) MG tablet Take 325 mg by mouth daily with breakfast.   Yes Historical Provider, MD  furosemide (LASIX) 40 MG tablet Take 20 mg by mouth 2 (two) times daily. 04/28/12  Yes Vesta Mixer, MD  Guaifenesin 1200 MG TB12 Take 600 mg by mouth 2 (two) times daily at 10 AM and 5 PM.  05/10/12  Yes Rodolph Bong, MD  levalbuterol [facility] HFA) 45 MCG/ACT inhaler Inhale 2 puffs into the lungs every 4 (four) hours as needed for wheezing or shortness of breath. For shortness of breath 05/10/12  Yes Rodolph Bong, MD  levalbuterol [facility]) 1.25 MG/3ML nebulizer solution Take 1.25 mg by nebulization every 6 (six) hours.   Yes Historical Provider, MD  methylPREDNISolone (MEDROL) 8 MG tablet Take 6-8 mg by mouth daily. Alternate 6 mg and 8 mg every day. Take 6 mg one day and 8 mg the next day 05/10/12  Yes Rodolph Bong, MD  mometasone (ELOCON) 0.1 % cream Apply 1 application topically as needed. For dry skin 12/17/11 12/16/12 Yes Marianne L York, PA  Mometasone Furo-Formoterol Fum (DULERA) 200-5 MCG/ACT AERO Inhale 2  puffs into the lungs 2 (two) times daily as needed. 08/07/12  Yes Waymon Budge, MD  montelukast (SINGULAIR) 10 MG tablet TAKE 1 TABLET BY MOUTH EVERY DAY 08/07/12  Yes Waymon Budge, MD  Omega-3 Fatty Acids (FISH OIL) 1200 MG CAPS Take 1 capsule by mouth daily.   Yes Historical Provider, MD  potassium chloride SA (K-DUR,KLOR-CON) 20 MEQ tablet Take 1 tablet (20 mEq total) by mouth 2 (two) times daily. 03/25/12  Yes Vesta Mixer, MD  senna (SENOKOT) 8.6 MG tablet Take 1 tablet by mouth every evening.   Yes Historical Provider, MD  sertraline (ZOLOFT) 50 MG tablet Take 50 mg by mouth daily.   Yes Historical Provider, MD  sotalol (BETAPACE) 80 MG tablet Take 80 mg by mouth 2 (two) times daily.   Yes Historical Provider, MD  tiotropium (SPIRIVA) 18 MCG inhalation capsule Place 18 mcg into  inhaler and inhale daily.   Yes Historical Provider, MD  vitamin C (ASCORBIC ACID) 500 MG tablet Take 500 mg by mouth daily.   Yes Historical Provider, MD  warfarin (COUMADIN) 1 MG tablet Take 1 mg by mouth as directed. Take 1 mg on Sunday, Monday, Wednesday, Thursdays and Saturday. Take 0.5 mg on Tuesdays and Fridays. 03/25/12  Yes Vesta Mixer, MD     Allergies:  Allergies  Allergen Reactions  . Cephalexin     Unsure as to reaction.  ? Hives with Cephalexin; "I can take Penicillin"  . Atorvastatin     Forgetfulness   . Azithromycin Nausea And Vomiting  . Codeine Nausea And Vomiting  . Crestor (Rosuvastatin Calcium)     Forgetfulness    . Estradiol     Unknown reaction  . Hydrocortisone Hives    Flea bites were mistaken as rash from hydrocortisone  . Lovastatin Other (See Comments)    forgetfullness,  . Moxifloxacin     Unknown reaction  . Sulfonamide Derivatives     itching    Social History:   reports that she quit smoking about 18 years ago. Her smoking use included Cigarettes. She has a 50 pack-year smoking history. She has never used smokeless tobacco. She reports that she does not  drink alcohol or use illicit drugs.  Family History: Family History  Problem Relation Age of Onset  . Kidney failure Father   . Colon cancer Paternal Aunt   . Heart disease Maternal Grandfather   . Heart attack Paternal Grandfather   . Breast cancer Daughter   . Stroke Maternal Grandmother   . Heart disease Mother      Physical Exam: Filed Vitals:   09/01/12 1734 09/01/12 1800 09/01/12 1830 09/01/12 2103  BP: 146/93 126/85 123/52   Pulse: 64 64 69   Temp: 97.9 F (36.6 C)     TempSrc: Axillary     Resp: 22 20 16    Height:    5' 2.5" (1.588 m)  Weight:    57.3 kg (126 lb 5.2 oz)  SpO2: 100% 92% 91%    Blood pressure 123/52, pulse 69, temperature 97.9 F (36.6 C), temperature source Axillary, resp. rate 16, height 5' 2.5" (1.588 m), weight 57.3 kg (126 lb 5.2 oz), SpO2 91.00%.  GEN:  Pleasant  patient lying in the stretcher in no acute distress; cooperative with exam. PSYCH:  alert and oriented x4; does not appear anxious or depressed; affect is appropriate. HEENT: Mucous membranes pink and anicteric; PERRLA; EOM intact; no cervical lymphadenopathy nor thyromegaly or carotid bruit; no JVD; There were no stridor. Neck is very supple. Breasts:: Not examined CHEST WALL: No tenderness.  PPM on right upper chest wall. CHEST: Decreased BS throughout.  She has diffuse tight wheezes bilaterally.   HEART: Regular rate and rhythm.  There are no murmur, rub, or gallops.   BACK: No kyphosis or scoliosis; no CVA tenderness ABDOMEN: soft and non-tender; no masses, no organomegaly, normal abdominal bowel sounds; no pannus; no intertriginous candida. There is no rebound and no distention. Rectal Exam: Not done EXTREMITIES: No bone or joint deformity; age-appropriate arthropathy of the hands and knees; no edema; no ulcerations.  There is no calf tenderness. Genitalia: not examined PULSES: 2+ and symmetric SKIN: Normal hydration no rash or ulceration CNS: Cranial nerves 2-12 grossly intact  no focal lateralizing neurologic deficit.  Speech is fluent; uvula elevated with phonation, facial symmetry and tongue midline. DTR are normal bilaterally, cerebella exam is  intact, barbinski is negative and strengths are equaled bilaterally.  No sensory loss.   Labs on Admission:  Basic Metabolic Panel:  Lab 09/01/12 0981  NA 133*  K 4.0  CL 94*  CO2 31  GLUCOSE 109*  BUN 12  CREATININE 0.52  CALCIUM 9.1  MG --  PHOS --   Liver Function Tests:  Lab 09/01/12 1835  AST 27  ALT 21  ALKPHOS 48  BILITOT 0.3  PROT 6.9  ALBUMIN 2.9*   No results found for this basename: LIPASE:5,AMYLASE:5 in the last 168 hours No results found for this basename: AMMONIA:5 in the last 168 hours CBC:  Lab 09/01/12 1835  WBC 10.9*  NEUTROABS --  HGB 11.3*  HCT 34.8*  MCV 82.3  PLT 241   Cardiac Enzymes: No results found for this basename: CKTOTAL:5,CKMB:5,CKMBINDEX:5,TROPONINI:5 in the last 168 hours  CBG: No results found for this basename: GLUCAP:5 in the last 168 hours   Radiological Exams on Admission: Dg Chest 2 View  09/01/2012  *RADIOLOGY REPORT*  Clinical Data: Weakness and shortness of breath.  Pneumonia.  CHEST - 2 VIEW  Comparison: One-view chest 09/01/2012.  Findings: Lateral right upper lobe pneumonia is again noted. Additional interstitial and airspace disease is present about the right hilum.  Emphysematous changes are stable.  The heart is mildly enlarged without significant edema or effusions to suggest failure.  The pacing wires stable.  IMPRESSION:  1.  Right lateral upper lobe pneumonia. 2.  Asymmetric right perihilar interstitial and airspace disease. 3.  Recommend follow-up chest radiograph to assure resolution of this airspace disease after appropriate treatment.   Original Report Authenticated By: Jamesetta Orleans. MATTERN, M.D.    Dg Chest Port 1 View  09/01/2012  *RADIOLOGY REPORT*  Clinical Data: Short of breath  PORTABLE CHEST - 1 VIEW  Comparison: 05/27/2012   Findings: COPD is present.  Right upper lobe pleural and parenchymal density appears more prominent.  Some of this may be due to projection and technique however I would suggest a follow-up two-view chest x-ray and if this area has progressed then a CT of the chest with contrast would be recommended.  Cardiac enlargement.  Negative for heart failure or effusion. Pacemaker is unchanged.  IMPRESSION: Right pleural and parenchymal density appears more prominent.  This may represent pneumonia or a tumor.  Follow-up two-view chest x-ray suggested.   Original Report Authenticated By: Camelia Phenes, M.D.      Assessment/Plan Present on Admission:  .PNA (pneumonia) .CHRONIC OBSTRUCTIVE ASTHMA UNSPECIFIED .Pacemaker .COPD with exacerbation .SOB (shortness of breath)   PLAN:  Will admit her for Tx of HAP.  Her last culture showed pseudomonas, and she was able to tolerate Rocephin in the ER (although she has many allergies), so we will go with Van/Cefepime.  I will continue and increase her Prednisone to 20mg  per day, along with giving stress dose steroid.  For her afib, she is in NSR now, and is intermittently paced.  I will continue her anticoagulation.  I have also continue her home meds.  Will check a Dig level.  She is stable, full code, and will be admitted into [facility] service under telemetry.   Other plans as per orders.  Code Status: FULL CODE.  Time:  1 hour.   Houston Siren, MD. [facility] Pager [phone] 7pm to 7am.  09/01/2012, 9:18 PM

## 2012-09-01 NOTE — Progress Notes (Signed)
ANTICOAGULATION CONSULT NOTE - Initial Consult  Pharmacy Consult for Coumadin Indication: atrial fibrillation, aortic valve replacement  Allergies  Allergen Reactions  . Cephalexin     Unsure as to reaction.  ? Hives with Cephalexin; "I can take Penicillin"  . Atorvastatin     Forgetfulness   . Azithromycin Nausea And Vomiting  . Codeine Nausea And Vomiting  . Crestor (Rosuvastatin Calcium)     Forgetfulness    . Estradiol     Unknown reaction  . Hydrocortisone Hives    Flea bites were mistaken as rash from hydrocortisone  . Lovastatin Other (See Comments)    forgetfullness,  . Moxifloxacin     Unknown reaction  . Sulfonamide Derivatives     itching    Patient Measurements: Height: 5' 2.5" (158.8 cm) Weight: 126 lb 5.2 oz (57.3 kg) (bed scale) IBW/kg (Calculated) : 51.25   Vital Signs: Temp: 97.7 F (36.5 C) (10/28 2103) Temp src: Oral (10/28 2103) BP: 82/53 mmHg (10/28 2103) Pulse Rate: 67  (10/28 2116)  Labs:  Basename 09/01/12 1905 09/01/12 1835  HGB -- 11.3*  HCT -- 34.8*  PLT -- 241  APTT -- --  LABPROT 21.0* --  INR 1.89* --  HEPARINUNFRC -- --  CREATININE -- 0.52  CKTOTAL -- --  CKMB -- --  TROPONINI -- --    Estimated Creatinine Clearance: 40.9 ml/min (by C-G formula based on Cr of 0.52).  Medical History: Past Medical History  Diagnosis Date  . Asthmatic bronchitis   . Allergic rhinitis   . Atrial fibrillation   . History of aortic valve replacement Dr. Tyrone Sage 2006    Has severe residual gradient and will be managed conservatively  . Sleep apnea   . Chronic obstructive asthma   . Osteoporosis   . Pruritic disorder   . Diverticulosis   . Hemorrhoids   . GERD (gastroesophageal reflux disease)   . IBS (irritable bowel syndrome)   . Pacemaker 2010  . Oxygen dependent   . Atrial flutter     on Betapace  . High risk medication use     on Betapace & Coumadin  . Chronic anticoagulation   . COPD (chronic obstructive pulmonary  disease)   . Pneumonia   . Anemia   . Arthritis   . Hypertension   . Coronary artery disease   . Pneumonia 09/2011    X 10 Sep 2011- June  2013  . Complication of anesthesia     trouble waking up    Assessment:  88 yof on coumadin PTA for h/o afib and aortic valve replacement.  Per patient and [facility], pt was on Coumadin 1mg  daily except 0.5 mg on Tuesdays and Fridays.  Last dose of COumadin reportedly 10/27 and INR on admit = 1.89.  Md ordered to resume coumadin per pharmacy.  CBC okay.  Goal of Therapy:  INR 2-3 Monitor platelets by anticoagulation protocol: Yes   Plan:   Coumadin 1mg  po x 1 tonight  Daily PT/INR  Pharmacy will f/u  Geoffry Paradise Thi 09/01/2012,9:27 PM

## 2012-09-01 NOTE — ED Notes (Signed)
EKG given to Dr Pickering 

## 2012-09-01 NOTE — ED Notes (Signed)
Per EMS: Pt has COPD and diagnosed with PNA for one week. Pt reports respiratory distress. Pt used home nebulizer without relief. EMS reported inspiratory and expiratory wheezing, bloody sputum. 15 albuterol and 0.5 Atrovent given by EMS, pt reports this has helped. Pt is also receiving in-home hospice care.

## 2012-09-01 NOTE — ED Provider Notes (Signed)
History     CSN: [id number]  Arrival date & time 09/01/12  1722   First MD Initiated Contact with Patient 09/01/12 1849      Chief Complaint  Patient presents with  . Shortness of Breath    (Consider location/radiation/quality/duration/timing/severity/associated sxs/prior treatment) Patient is a 78 y.o. female presenting with shortness of breath. The history is provided by the patient and a relative. No language interpreter was used.  Shortness of Breath  The current episode started 5 to 7 days ago. The onset was sudden. The problem occurs continuously. The problem has been gradually worsening. The problem is moderate. Associated symptoms include cough, shortness of breath and wheezing. Pertinent negatives include no chest pain, no chest pressure, no fever, no rhinorrhea and no sore throat. She has not inhaled smoke recently. She is currently using steroids. She has had prior hospitalizations. She has had no prior ICU admissions. She has had no prior intubations. Her past medical history is significant for asthma, bronchiolitis and past wheezing. Recently, medical care has been given by the PCP and by EMS.   78 year old female patient of Dr. Alwyn Ren coming in today with increased shortness of breath. Patient is a hospice patient at home with end-stage COPD. She had for a neb treatments today with no improvement. She has been on Augmentin for one week per Dr. Maple Hudson. Patient is in atrial fib on Coumadin. She is on 4 L nasal cannula at home. She is unable to walk to the bathroom like she normally does. She also had Xopenex treatments before arrival. States that she has had pneumonia 6 times since last November. Past medical history listed below. Denies any swelling in her lower extremities. She does have a pacer. Patient has many allergies.   Past Medical History  Diagnosis Date  . Asthmatic bronchitis   . Allergic rhinitis   . Atrial fibrillation   . History of aortic valve replacement Dr.  Tyrone Sage 2006    Has severe residual gradient and will be managed conservatively  . Sleep apnea   . Chronic obstructive asthma   . Osteoporosis   . Pruritic disorder   . Diverticulosis   . Hemorrhoids   . GERD (gastroesophageal reflux disease)   . IBS (irritable bowel syndrome)   . Pacemaker 2010  . Oxygen dependent   . Atrial flutter     on Betapace  . High risk medication use     on Betapace & Coumadin  . Chronic anticoagulation   . COPD (chronic obstructive pulmonary disease)   . Pneumonia   . Anemia   . Arthritis   . Hypertension   . Coronary artery disease   . Pneumonia 09/2011    X 10 Sep 2011- June  2013    Past Surgical History  Procedure Date  . Back surgery 1994  . Tonsillectomy   . Appendectomy   . Ganglion cyst excision   . Vocal cord polyps 1975 and 1984  . Aortic valve replacement 2006    Bioprosthetic  . Pacemaker insertion 2010  . Cardiac electrophysiology mapping and ablation   . Coronary artery bypass graft   . Insert / replace / remove pacemaker   . Eye surgery   . Cardiac catheterization   . Coronary angioplasty   . Cardiac valve replacement     Family History  Problem Relation Age of Onset  . Kidney failure Father   . Colon cancer Paternal Aunt   . Heart disease Maternal Grandfather   . Heart  attack Paternal Grandfather   . Breast cancer Daughter   . Stroke Maternal Grandmother   . Heart disease Mother     History  Substance Use Topics  . Smoking status: Former Smoker -- 1.0 packs/day for 50 years    Types: Cigarettes    Quit date: 11/05/1993  . Smokeless tobacco: Never Used  . Alcohol Use: No    OB History    Grav Para Term Preterm Abortions TAB SAB Ect Mult Living                  Review of Systems  Constitutional: Negative.  Negative for fever and chills.  HENT: Negative.  Negative for ear pain, congestion, sore throat, rhinorrhea and postnasal drip.   Eyes: Negative.   Respiratory: Positive for cough, shortness of  breath and wheezing.   Cardiovascular: Negative.  Negative for chest pain.  Gastrointestinal: Negative.   Genitourinary: Negative for dysuria, flank pain, vaginal bleeding and difficulty urinating.  Musculoskeletal: Negative for back pain.  Neurological: Negative.  Negative for dizziness, syncope, speech difficulty, weakness, light-headedness and headaches.  Psychiatric/Behavioral: Negative.  Negative for behavioral problems.  All other systems reviewed and are negative.    Allergies  Cephalexin; Atorvastatin; Azithromycin; Codeine; Crestor; Estradiol; Hydrocortisone; Lovastatin; Moxifloxacin; and Sulfonamide derivatives  Home Medications   Current Outpatient Rx  Name Route Sig Dispense Refill  . ALPRAZOLAM 0.25 MG PO TABS Oral Take 0.25 mg by mouth at bedtime as needed. For anxiety    . MAGIC MOUTHWASH Oral Take 5 mLs by mouth 4 (four) times daily as needed. For mouth irritations     . AMOXICILLIN-POT CLAVULANATE 500-125 MG PO TABS Oral Take 1 tablet by mouth 2 (two) times daily.    Marland Kitchen CALCIUM CITRATE + D PO Oral Take 1 tablet by mouth daily.    Marland Kitchen DIGOXIN 0.125 MG PO TABS Oral Take 1 tablet (125 mcg total) by mouth every evening. 30 tablet 6  . DILTIAZEM HCL ER COATED BEADS 240 MG PO CP24 Oral Take 240 mg by mouth daily.    Marland Kitchen EPINEPHRINE 0.15 MG/0.3ML IJ DEVI Intramuscular Inject 0.3 mLs (0.15 mg total) into the muscle as needed for anaphylaxis. 1 each prn  . ESOMEPRAZOLE MAGNESIUM 40 MG PO CPDR Oral Take 40 mg by mouth daily before breakfast.    . EZETIMIBE 10 MG PO TABS Oral Take 10 mg by mouth every evening.    Marland Kitchen FERROUS SULFATE 325 (65 FE) MG PO TABS Oral Take 325 mg by mouth daily with breakfast.    . FUROSEMIDE 40 MG PO TABS Oral Take 20 mg by mouth 2 (two) times daily.    . GUAIFENESIN ER 1200 MG PO TB12 Oral Take 600 mg by mouth 2 (two) times daily at 10 AM and 5 PM.     . LEVALBUTEROL TARTRATE 45 MCG/ACT IN AERO Inhalation Inhale 2 puffs into the lungs every 4 (four) hours as  needed for wheezing or shortness of breath. For shortness of breath 1 Inhaler 0  . LEVALBUTEROL HCL 1.25 MG/3ML IN NEBU Nebulization Take 1.25 mg by nebulization every 6 (six) hours.    . METHYLPREDNISOLONE 8 MG PO TABS Oral Take 6-8 mg by mouth daily. Alternate 6 mg and 8 mg every day. Take 6 mg one day and 8 mg the next day    . MOMETASONE FUROATE 0.1 % EX CREA Topical Apply 1 application topically as needed. For dry skin    . MOMETASONE FURO-FORMOTEROL FUM 200-5 MCG/ACT IN AERO  Inhalation Inhale 2 puffs into the lungs 2 (two) times daily as needed. 13 g 6  . MONTELUKAST SODIUM 10 MG PO TABS  TAKE 1 TABLET BY MOUTH EVERY DAY 30 tablet 3  . FISH OIL 1200 MG PO CAPS Oral Take 1 capsule by mouth daily.    Marland Kitchen POTASSIUM CHLORIDE CRYS ER 20 MEQ PO TBCR Oral Take 1 tablet (20 mEq total) by mouth 2 (two) times daily. 60 tablet 11    90 day supply is acceptable  . SENNOSIDES 8.6 MG PO TABS Oral Take 1 tablet by mouth every evening.    Marland Kitchen SERTRALINE HCL 50 MG PO TABS Oral Take 50 mg by mouth daily.    Marland Kitchen SOTALOL HCL 80 MG PO TABS Oral Take 80 mg by mouth 2 (two) times daily.    Marland Kitchen TIOTROPIUM BROMIDE MONOHYDRATE 18 MCG IN CAPS Inhalation Place 18 mcg into inhaler and inhale daily.    Marland Kitchen VITAMIN C 500 MG PO TABS Oral Take 500 mg by mouth daily.    . WARFARIN SODIUM 1 MG PO TABS Oral Take 1 mg by mouth as directed. Take 1 mg on Sunday, Monday, Wednesday, Thursdays and Saturday. Take 0.5 mg on Tuesdays and Fridays.      BP 126/85  Pulse 64  Temp 97.9 F (36.6 C) (Axillary)  Resp 20  SpO2 92%  Physical Exam  Nursing note and vitals reviewed. Constitutional: She is oriented to person, place, and time. She appears well-developed and well-nourished.  HENT:  Head: Normocephalic and atraumatic.  Eyes: Conjunctivae normal and EOM are normal. Pupils are equal, round, and reactive to light.  Neck: Normal range of motion. Neck supple.  Cardiovascular: Normal rate.   Pulmonary/Chest: Effort normal. No  respiratory distress. She has rales.  Abdominal: Soft. Bowel sounds are normal. She exhibits no distension.  Musculoskeletal: Normal range of motion. She exhibits no edema and no tenderness.  Neurological: She is alert and oriented to person, place, and time. She has normal reflexes. No cranial nerve deficit.  Skin: Skin is warm and dry.  Psychiatric: She has a normal mood and affect.    ED Course  Procedures (including critical care time)  Labs Reviewed  CBC - Abnormal; Notable for the following:    WBC 10.9 (*)     Hemoglobin 11.3 (*)     HCT 34.8 (*)     All other components within normal limits  COMPREHENSIVE METABOLIC PANEL  PRO B NATRIURETIC PEPTIDE  PROTIME-INR   Dg Chest Port 1 View  09/01/2012  *RADIOLOGY REPORT*  Clinical Data: Short of breath  PORTABLE CHEST - 1 VIEW  Comparison: 05/27/2012  Findings: COPD is present.  Right upper lobe pleural and parenchymal density appears more prominent.  Some of this may be due to projection and technique however I would suggest a follow-up two-view chest x-ray and if this area has progressed then a CT of the chest with contrast would be recommended.  Cardiac enlargement.  Negative for heart failure or effusion. Pacemaker is unchanged.  IMPRESSION: Right pleural and parenchymal density appears more prominent.  This may represent pneumonia or a tumor.  Follow-up two-view chest x-ray suggested.   Original Report Authenticated By: Camelia Phenes, M.D.      No diagnosis found.    MDM  78 year old female with increased shortness of breath and right upper lobe pneumonia. Outpatient treatment with Augmentin x6 days her Dr. Maple Hudson. White blood cell count is 10.9. BNP is 796. INR is 1.89 on  Coumadin.  Patient will be admitted per hospitalist team 8, Dr. Conley Rolls to telemetry bed.    Rocehen 1gm IV in ER.     Labs Reviewed  CBC - Abnormal; Notable for the following:    WBC 10.9 (*)     Hemoglobin 11.3 (*)     HCT 34.8 (*)     All other  components within normal limits  COMPREHENSIVE METABOLIC PANEL - Abnormal; Notable for the following:    Sodium 133 (*)     Chloride 94 (*)     Glucose, Bld 109 (*)     Albumin 2.9 (*)     GFR calc non Af Amer 84 (*)     All other components within normal limits  PRO B NATRIURETIC PEPTIDE - Abnormal; Notable for the following:    Pro B Natriuretic peptide (BNP) 796.0 (*)     All other components within normal limits  PROTIME-INR - Abnormal; Notable for the following:    Prothrombin Time 21.0 (*)     INR 1.89 (*)     All other components within normal limits          Remi Haggard, NP 09/01/12 2005

## 2012-09-01 NOTE — Progress Notes (Signed)
ANTIBIOTIC CONSULT NOTE - INITIAL  Pharmacy Consult for Vancomycin Indication: suspected PNA  Allergies  Allergen Reactions  . Cephalexin     Unsure as to reaction.  ? Hives with Cephalexin; "I can take Penicillin"  . Atorvastatin     Forgetfulness   . Azithromycin Nausea And Vomiting  . Codeine Nausea And Vomiting  . Crestor (Rosuvastatin Calcium)     Forgetfulness    . Estradiol     Unknown reaction  . Hydrocortisone Hives    Flea bites were mistaken as rash from hydrocortisone  . Lovastatin Other (See Comments)    forgetfullness,  . Moxifloxacin     Unknown reaction  . Sulfonamide Derivatives     itching    Patient Measurements:   Adjusted Body Weight:   Vital Signs: Temp: 97.9 F (36.6 C) (10/28 1734) Temp src: Axillary (10/28 1734) BP: 123/52 mmHg (10/28 1830) Pulse Rate: 69  (10/28 1830) Intake/Output from previous day:   Intake/Output from this shift:    Labs:  [facility] 09/01/12 1835  WBC 10.9*  HGB 11.3*  PLT 241  LABCREA --  CREATININE 0.52   The CrCl is unknown because both a height and weight (above a minimum accepted value) are required for this calculation. No results found for this basename: VANCOTROUGH:2,VANCOPEAK:2,VANCORANDOM:2,GENTTROUGH:2,GENTPEAK:2,GENTRANDOM:2,TOBRATROUGH:2,TOBRAPEAK:2,TOBRARND:2,AMIKACINPEAK:2,AMIKACINTROU:2,AMIKACIN:2, in the last 72 hours   Medical History: Past Medical History  Diagnosis Date  . Asthmatic bronchitis   . Allergic rhinitis   . Atrial fibrillation   . History of aortic valve replacement Dr. Tyrone Sage 2006    Has severe residual gradient and will be managed conservatively  . Sleep apnea   . Chronic obstructive asthma   . Osteoporosis   . Pruritic disorder   . Diverticulosis   . Hemorrhoids   . GERD (gastroesophageal reflux disease)   . IBS (irritable bowel syndrome)   . Pacemaker 2010  . Oxygen dependent   . Atrial flutter     on Betapace  . High risk medication use     on Betapace &  Coumadin  . Chronic anticoagulation   . COPD (chronic obstructive pulmonary disease)   . Pneumonia   . Anemia   . Arthritis   . Hypertension   . Coronary artery disease   . Pneumonia 09/2011    X 10 Sep 2011- June  2013  . Complication of anesthesia     trouble waking up   Assessment:  61 yof with h/o end stage COPD, home hospice, diagnosed with PNA last week, started on Augmentin per PCP.  Patient presented 10/28 with increased SOB.  Pt reported has had PNA 6 times since last November. CXR here c/w PNA.  Pt has Levaquin on board and MD ordered to start Vancomycin per pharmacy.  Per record, pt has a h/o of pseudomonas PNA in Feb and July 2013, latest cx with intermediate susceptibility to Cipro (sensitive to cefepime, ceftazidime and Zosyn). Noted allergy to Cephalexin but pt tolerated Ceftriaxone dose ok in the ED and once here during previous admission. Per telephone order with Dr. Conley Rolls, ok to start Vanc and Cefepime.   Afebrile, mild leukocytosis.  Scr wnl, CrCl 41 ml/min, normalized 57 ml/min  Goal of Therapy:  Vancomycin trough level 15-20 mcg/ml  Plan:   Vancomycin 500 mg IV q12h  Cefepime 1g Iv q12h - will monitor closely for reaction if occur.  Pharmacy will f/u  Geoffry Paradise, PharmD, BCPS Pager: [phone] 9:17 PM Pharmacy #: [id number]

## 2012-09-01 NOTE — Telephone Encounter (Signed)
Per CY-have patient go to ER; [name] is aware and will let patient and family know of this.

## 2012-09-02 ENCOUNTER — Encounter (HOSPITAL_COMMUNITY): Payer: Self-pay | Admitting: Oncology

## 2012-09-02 DIAGNOSIS — J9600 Acute respiratory failure, unspecified whether with hypoxia or hypercapnia: Secondary | ICD-10-CM

## 2012-09-02 DIAGNOSIS — J441 Chronic obstructive pulmonary disease with (acute) exacerbation: Secondary | ICD-10-CM

## 2012-09-02 DIAGNOSIS — I359 Nonrheumatic aortic valve disorder, unspecified: Secondary | ICD-10-CM

## 2012-09-02 LAB — TSH: TSH: 1.499 u[IU]/mL (ref 0.350–4.500)

## 2012-09-02 LAB — EXPECTORATED SPUTUM ASSESSMENT W GRAM STAIN, RFLX TO RESP C

## 2012-09-02 LAB — PROTIME-INR: INR: 1.82 — ABNORMAL HIGH (ref 0.00–1.49)

## 2012-09-02 MED ORDER — ALBUTEROL SULFATE (5 MG/ML) 0.5% IN NEBU
2.5000 mg | INHALATION_SOLUTION | RESPIRATORY_TRACT | Status: DC
  Administered 2012-09-02 – 2012-09-06 (×23): 2.5 mg via RESPIRATORY_TRACT
  Filled 2012-09-02 (×26): qty 0.5

## 2012-09-02 MED ORDER — FUROSEMIDE 20 MG PO TABS
20.0000 mg | ORAL_TABLET | Freq: Two times a day (BID) | ORAL | Status: DC
  Administered 2012-09-02: 20 mg via ORAL
  Filled 2012-09-02 (×3): qty 1

## 2012-09-02 MED ORDER — BIOTENE DRY MOUTH MT LIQD
15.0000 mL | Freq: Two times a day (BID) | OROMUCOSAL | Status: DC
  Administered 2012-09-02 – 2012-09-08 (×12): 15 mL via OROMUCOSAL

## 2012-09-02 MED ORDER — WARFARIN SODIUM 1 MG PO TABS
1.0000 mg | ORAL_TABLET | Freq: Once | ORAL | Status: AC
  Administered 2012-09-02: 1 mg via ORAL
  Filled 2012-09-02: qty 1

## 2012-09-02 NOTE — Progress Notes (Signed)
[facility]:Sw note  Sw met with Pt, who was sitting up in bed eating her breakfast. Pt stated that she had slept well, "after they gave me the happy pill". Pt stated that she thinks she needed to come to the hospital and was glad she was here now. Pt stated that she plans to return home after this hospital stay. Pt had 24 hours caregivers in the home prior to this admission, Pt does remain a full code at this time. Sw provided emotional support during this visit.  Lorain Childes, LCSW, ACHP-SW cell # [phone]

## 2012-09-02 NOTE — Progress Notes (Signed)
[facility] PROGRESS NOTE  NAJIYAH PARIS FAO:[id number] DOB: [birth year] DOA: 09/01/2012  PCP: [name], MD  Brief HPI: [name] is an 78 y.o. female with prior hx of tobacco use, COPD on home Oxygen, under hospice care with full code status, Hx of afib on chronic anticoagulation with INR 1.89 on admission, s/p pacer placement on pacerone and dig, Hx of AVR (pig), HTN, chronic steroid use being tapered to 6mg /day, presented to the ER with SOB, yellow sputum productive coughs and hemoptysis, but no chest pain or pleuritic pain for 5 days prior to admission. She had been feeling weak as well. Evaluation in the ER included a CXR which showed a PNA, no leukocytosis with WBC 10.9K, and normal renal fx tests. Her Na is a little low at 133. She is on lasix. Hospitalist was asked to admit her for PNA.  Past medical history:  Past Medical History  Diagnosis Date  . Asthmatic bronchitis   . Allergic rhinitis   . Atrial fibrillation   . History of aortic valve replacement Dr. Tyrone Sage 2006    Has severe residual gradient and will be managed conservatively  . Sleep apnea   . Chronic obstructive asthma   . Osteoporosis   . Pruritic disorder   . Diverticulosis   . Hemorrhoids   . GERD (gastroesophageal reflux disease)   . IBS (irritable bowel syndrome)   . Pacemaker 2010  . Oxygen dependent   . Atrial flutter     on Betapace  . High risk medication use     on Betapace & Coumadin  . Chronic anticoagulation   . COPD (chronic obstructive pulmonary disease)   . Pneumonia   . Anemia   . Arthritis   . Hypertension   . Coronary artery disease   . Pneumonia 09/2011    X 10 Sep 2011- June  2013  . Complication of anesthesia     trouble waking up    Consultants: None  Procedures: None  Antibiotics: Cefepime and Vancomycin started 10/28  Subjective: Patient feels better but still SOB and coughing up brown sputum mixed with blood. Has some chest discomfort on right  side. No nausea or vomiting.  Objective: Vital Signs Filed Vitals:   09/01/12 2240 09/02/12 0229 09/02/12 0501 09/02/12 0802  BP: 115/54  111/56   Pulse:   64   Temp:   97.7 F (36.5 C)   TempSrc:   Oral   Resp: 18 20 18    Height:      Weight:      SpO2:   97% 94%    Intake/Output Summary (Last 24 hours) at 09/02/12 1101 Last data filed at 09/02/12 1003  Gross per 24 hour  Intake    390 ml  Output   1150 ml  Net   -760 ml   Filed Weights   09/01/12 2103  Weight: 57.3 kg (126 lb 5.2 oz)    General appearance: alert, cooperative, appears stated age and no distress Head: Normocephalic, without obvious abnormality, atraumatic Back: symmetric, no curvature. ROM normal. No CVA tenderness. Resp: end exp wheezes bilaterally. Coarse rhonchi. Crackles heard on right more than left. Cardio: regular rate and rhythm, S1, S2 normal, no murmur, click, rub or gallop GI: soft, non-tender; bowel sounds normal; no masses,  no organomegaly Extremities: extremities normal, atraumatic, no cyanosis or edema Skin: Skin color, texture, turgor normal. No rashes or lesions Neurologic: Alert and oriented x 3. No focal deficits.  Lab Results:  Basic Metabolic Panel:  Lab 09/01/12 1610  NA 133*  K 4.0  CL 94*  CO2 31  GLUCOSE 109*  BUN 12  CREATININE 0.52  CALCIUM 9.1  MG --  PHOS --   Liver Function Tests:  Lab 09/01/12 1835  AST 27  ALT 21  ALKPHOS 48  BILITOT 0.3  PROT 6.9  ALBUMIN 2.9*   No results found for this basename: LIPASE:5,AMYLASE:5 in the last 168 hours No results found for this basename: AMMONIA:5 in the last 168 hours CBC:  Lab 09/01/12 1835  WBC 10.9*  NEUTROABS --  HGB 11.3*  HCT 34.8*  MCV 82.3  PLT 241   Cardiac Enzymes: No results found for this basename: CKTOTAL:5,CKMB:5,CKMBINDEX:5,TROPONINI:5 in the last 168 hours BNP (last 3 results)  Basename 09/01/12 1905 12/10/11 1833 09/18/11 0630  PROBNP 796.0* 501.6* 944.2*    Studies/Results: Dg  Chest 2 View  09/01/2012  *RADIOLOGY REPORT*  Clinical Data: Weakness and shortness of breath.  Pneumonia.  CHEST - 2 VIEW  Comparison: One-view chest 09/01/2012.  Findings: Lateral right upper lobe pneumonia is again noted. Additional interstitial and airspace disease is present about the right hilum.  Emphysematous changes are stable.  The heart is mildly enlarged without significant edema or effusions to suggest failure.  The pacing wires stable.  IMPRESSION:  1.  Right lateral upper lobe pneumonia. 2.  Asymmetric right perihilar interstitial and airspace disease. 3.  Recommend follow-up chest radiograph to assure resolution of this airspace disease after appropriate treatment.   Original Report Authenticated By: Jamesetta Orleans. MATTERN, M.D.    Dg Chest Port 1 View  09/01/2012  *RADIOLOGY REPORT*  Clinical Data: Short of breath  PORTABLE CHEST - 1 VIEW  Comparison: 05/27/2012  Findings: COPD is present.  Right upper lobe pleural and parenchymal density appears more prominent.  Some of this may be due to projection and technique however I would suggest a follow-up two-view chest x-ray and if this area has progressed then a CT of the chest with contrast would be recommended.  Cardiac enlargement.  Negative for heart failure or effusion. Pacemaker is unchanged.  IMPRESSION: Right pleural and parenchymal density appears more prominent.  This may represent pneumonia or a tumor.  Follow-up two-view chest x-ray suggested.   Original Report Authenticated By: Camelia Phenes, M.D.     Medications:  Scheduled:   . albuterol  2.5 mg Nebulization Q6H  . antiseptic oral rinse  15 mL Mouth Rinse BID  . ceFEPime (MAXIPIME) IV  1 g Intravenous Q12H  . cefTRIAXone (ROCEPHIN)  IV  1 g Intravenous Once  . digoxin  125 mcg Oral QPM  . diltiazem  240 mg Oral Daily  . ezetimibe  10 mg Oral QPM  . ferrous sulfate  325 mg Oral Q breakfast  . guaiFENesin  600 mg Oral BID  . hydrocortisone sod succinate (SOLU-CORTEF)  injection  50 mg Intravenous Q8H  . montelukast  10 mg Oral QHS  . pantoprazole  40 mg Oral Daily  . predniSONE  20 mg Oral QAC breakfast  . senna  1 tablet Oral QHS  . sertraline  50 mg Oral Daily  . sodium chloride  3 mL Intravenous Q12H  . sodium chloride  3 mL Intravenous Q12H  . sotalol  80 mg Oral BID  . tiotropium  18 mcg Inhalation Daily  . vancomycin  500 mg Intravenous Q12H  . vitamin C  500 mg Oral Daily  . warfarin  1 mg Oral  Once  . warfarin  1 mg Oral ONCE-1800  . Warfarin - Pharmacist Dosing Inpatient   Does not apply q1800  . DISCONTD: ciprofloxacin  400 mg Intravenous Q12H  . DISCONTD: guaiFENesin  600 mg Oral BID  . DISCONTD: Guaifenesin  600 mg Oral BID  . DISCONTD: levofloxacin (LEVAQUIN) IV  500 mg Intravenous Q24H  . DISCONTD: senna  1 tablet Oral QPM   Continuous:  XLK:GMWNUU chloride, ALPRAZolam, levalbuterol, magic mouthwash, mometasone-formoterol, morphine injection, ondansetron (ZOFRAN) IV, ondansetron, sodium chloride  Assessment/Plan:  Principal Problem:  *PNA (pneumonia) Active Problems:  CHRONIC OBSTRUCTIVE ASTHMA UNSPECIFIED  Pacemaker  Chronic anticoagulation  COPD with exacerbation  SOB (shortness of breath)  Chronic use of steroids    Community-acquired pneumonia with a history of Pseudomonas Continue with Cefepime and vancomycin for now. Sputum cultures will be obtained. May need to consider changing antibiotics if there is not an appropriate response. During previous hospitalization in July she did tolerate the Levaquin, as well as Zosyn. And she was discharged on oral ciprofloxacin.  History of COPD with acute respiratory failure. Nebulizer treatments will be continued. Intravenous steroids were continued and will be changed over to oral steroids. Oxygen will be continued for now. She is on chronic steroids at home. She is followed by Dr. Maple Hudson as OP  History of aortic valve replacement on chronic anticoagulation. Continue with  warfarin per pharmacy. She appears to be stable.  History of hypertension. Continue with antihypertensive regimen. Blood pressure is reasonably well controlled.  Code Status Full Code  DVT Prophylaxis On warfarin  Family Communication: None at bedside  Disposition Plan: She has hospice services at home. Anticipate discharge home when better.   Time spent: 35 mins  LOS: 1 day   [facility]  [facility] Pager [phone] 09/02/2012, 11:01 AM  If 8PM-8AM, please contact night-coverage at [email or web address], password [facility]

## 2012-09-02 NOTE — Progress Notes (Addendum)
Room 1425 - [name] - [facility] RN Visit-R.Shirleymae Hauth RN  Related admission to [facility] diagnosis of COPD.  Pt is FULL  code.    Pt alert & oriented, lying in bed, with complaints of weakness, continuing to cough up product, but feeling much better.  Observed increase in breathing effort as pt was talking during visit.   No family present.    Pt states she felt she was coming down with PNA and has experienced increased weakness, anorexia (meal intake down from 100% to 25%) and hemoptysis and productive cough.  Pt on Vanc & Cefepime - IV.   Pt's home med list is on shadow chart.   Please call [facility] @ [phone]- ask for RN Liaison or after hours,ask for on-call RN with any hospice needs.    Thank you.  Joneen Boers, RN  [facility]  Hospice Liaison

## 2012-09-02 NOTE — ED Provider Notes (Signed)
Medical screening examination/treatment/procedure(s) were performed by non-physician practitioner and as supervising physician I was immediately available for consultation/collaboration  Harrold Donath R. Rubin Payor, MD 09/02/12 1308

## 2012-09-02 NOTE — Progress Notes (Addendum)
ANTICOAGULATION CONSULT NOTE - Initial Consult  Pharmacy Consult for Coumadin Indication: atrial fibrillation, aortic valve replacement  Allergies  Allergen Reactions  . Cephalexin     Unsure as to reaction.  ? Hives with Cephalexin; "I can take Penicillin"  . Atorvastatin     Forgetfulness   . Azithromycin Nausea And Vomiting  . Codeine Nausea And Vomiting  . Crestor (Rosuvastatin Calcium)     Forgetfulness    . Estradiol     Unknown reaction  . Hydrocortisone Hives    Flea bites were mistaken as rash from hydrocortisone  . Lovastatin Other (See Comments)    forgetfullness,  . Moxifloxacin     Unknown reaction  . Sulfonamide Derivatives     itching    Patient Measurements: Height: 5' 2.5" (158.8 cm) Weight: 126 lb 5.2 oz (57.3 kg) (bed scale) IBW/kg (Calculated) : 51.25   Vital Signs: Temp: 97.7 F (36.5 C) (10/29 0501) Temp src: Oral (10/29 0501) BP: 111/56 mmHg (10/29 0501) Pulse Rate: 64  (10/29 0501)  Labs:  Basename 09/02/12 0446 09/01/12 1905 09/01/12 1835  HGB -- -- 11.3*  HCT -- -- 34.8*  PLT -- -- 241  APTT -- -- --  LABPROT 20.4* 21.0* --  INR 1.82* 1.89* --  HEPARINUNFRC -- -- --  CREATININE -- -- 0.52  CKTOTAL -- -- --  CKMB -- -- --  TROPONINI -- -- --    Estimated Creatinine Clearance: 40.9 ml/min (by C-G formula based on Cr of 0.52).  Medical History: Past Medical History  Diagnosis Date  . Asthmatic bronchitis   . Allergic rhinitis   . Atrial fibrillation   . History of aortic valve replacement Dr. Tyrone Sage 2006    Has severe residual gradient and will be managed conservatively  . Sleep apnea   . Chronic obstructive asthma   . Osteoporosis   . Pruritic disorder   . Diverticulosis   . Hemorrhoids   . GERD (gastroesophageal reflux disease)   . IBS (irritable bowel syndrome)   . Pacemaker 2010  . Oxygen dependent   . Atrial flutter     on Betapace  . High risk medication use     on Betapace & Coumadin  . Chronic  anticoagulation   . COPD (chronic obstructive pulmonary disease)   . Pneumonia   . Anemia   . Arthritis   . Hypertension   . Coronary artery disease   . Pneumonia 09/2011    X 10 Sep 2011- June  2013  . Complication of anesthesia     trouble waking up    Assessment:  88 yof on coumadin PTA for h/o afib and aortic valve replacement.  Per patient and [facility], pt was on Coumadin 1mg  daily except 0.5 mg on Tuesdays and Fridays.  Last dose of COumadin reportedly 10/27 and INR on admit = 1.89.  Md ordered to resume coumadin per pharmacy.   INR=1.82 this am, no CBC  Goal of Therapy:  INR 2-3 per [place] coumadin clinic note Monitor platelets by anticoagulation protocol: Yes   Plan:   Coumadin 1mg  po x 1 tonight (normal dose on tue =0.5mg ) for subtherapeutic INR  Daily PT/INR  Suzette Battiest, Tad Moore 09/02/2012,10:44 AM

## 2012-09-02 NOTE — Progress Notes (Signed)
   CARE MANAGEMENT NOTE 09/02/2012  Patient:  ANUHYA, SURGEON   Account Number:  [id number]  Date Initiated:  09/02/2012  Documentation initiated by:  Jiles Crocker  Subjective/Objective Assessment:   ADMITTED WITH PNEUMONIA     Action/Plan:   PCP:   [name], MD  LIVES AT HOME WITH FAMILY; RECEIVES HOSPICE CARE/ HOSPICE RN   Anticipated DC Date:  09/09/2012   Anticipated DC Plan:  HOME W HOSPICE CARE      DC Planning Services  CM consult   Status of service:  In process, will continue to follow Medicare Important Message given?  NA - LOS <3 / Initial given by admissions (If response is "NO", the following Medicare IM given date fields will be blank)  Per UR Regulation:  Reviewed for med. necessity/level of care/duration of stay Comments:  09/02/2012- B Satonya Lux RN, BSN, MHA

## 2012-09-03 DIAGNOSIS — D72829 Elevated white blood cell count, unspecified: Secondary | ICD-10-CM

## 2012-09-03 LAB — MAGNESIUM: Magnesium: 2 mg/dL (ref 1.5–2.5)

## 2012-09-03 LAB — BASIC METABOLIC PANEL
Calcium: 8.9 mg/dL (ref 8.4–10.5)
Creatinine, Ser: 0.64 mg/dL (ref 0.50–1.10)
GFR calc Af Amer: >90 mL/min (ref >90)
GFR calc non Af Amer: 79 mL/min — ABNORMAL LOW (ref >90)
Sodium: 132 mEq/L — ABNORMAL LOW (ref 135–145)

## 2012-09-03 LAB — CBC
Platelets: 239 10*3/uL (ref 150–400)
RDW: 14.8 % (ref 11.5–15.5)
WBC: 16.4 10*3/uL — ABNORMAL HIGH (ref 4.0–10.5)

## 2012-09-03 LAB — PROTIME-INR
INR: 2.39 — ABNORMAL HIGH (ref 0.00–1.49)
Prothrombin Time: 25 seconds — ABNORMAL HIGH (ref 11.6–15.2)

## 2012-09-03 MED ORDER — PREDNISONE 20 MG PO TABS
20.0000 mg | ORAL_TABLET | Freq: Every day | ORAL | Status: AC
  Administered 2012-09-03: 20 mg via ORAL
  Filled 2012-09-03: qty 1

## 2012-09-03 MED ORDER — ACETAMINOPHEN 325 MG PO TABS
650.0000 mg | ORAL_TABLET | Freq: Three times a day (TID) | ORAL | Status: DC | PRN
  Administered 2012-09-03 – 2012-09-04 (×2): 650 mg via ORAL
  Filled 2012-09-03: qty 2

## 2012-09-03 MED ORDER — WARFARIN 0.5 MG HALF TABLET
0.5000 mg | ORAL_TABLET | Freq: Once | ORAL | Status: AC
  Administered 2012-09-03: 0.5 mg via ORAL
  Filled 2012-09-03: qty 1

## 2012-09-03 MED ORDER — ACETAMINOPHEN 325 MG PO TABS
ORAL_TABLET | ORAL | Status: AC
  Filled 2012-09-03: qty 2

## 2012-09-03 MED ORDER — LEVOFLOXACIN IN D5W 750 MG/150ML IV SOLN
750.0000 mg | INTRAVENOUS | Status: DC
  Administered 2012-09-03 – 2012-09-05 (×3): 750 mg via INTRAVENOUS
  Filled 2012-09-03 (×3): qty 150

## 2012-09-03 MED ORDER — PREDNISONE 20 MG PO TABS: 40.0000 mg | ORAL_TABLET | Freq: Every day | ORAL | Status: DC

## 2012-09-03 NOTE — Progress Notes (Signed)
ANTICOAGULATION CONSULT NOTE - Follow Up Consult  Pharmacy Consult for Coumadin Indication: atrial fibrillation, aortic valve replacement  Allergies  Allergen Reactions  . Cephalexin     Unsure as to reaction.  ? Hives with Cephalexin; "I can take Penicillin"  . Atorvastatin     Forgetfulness   . Azithromycin Nausea And Vomiting  . Codeine Nausea And Vomiting  . Crestor (Rosuvastatin Calcium)     Forgetfulness    . Estradiol     Unknown reaction  . Hydrocortisone Hives    Flea bites were mistaken as rash from hydrocortisone  . Lovastatin Other (See Comments)    forgetfullness,  . Moxifloxacin     Unknown reaction  . Sulfonamide Derivatives     itching    Labs:  Basename 09/03/12 0139 09/02/12 0446 09/01/12 1905 09/01/12 1835  HGB 10.9* -- -- 11.3*  HCT 33.6* -- -- 34.8*  PLT 239 -- -- 241  APTT -- -- -- --  LABPROT 25.0* 20.4* 21.0* --  INR 2.39* 1.82* 1.89* --  HEPARINUNFRC -- -- -- --  CREATININE 0.64 -- -- 0.52  CKTOTAL -- -- -- --  CKMB -- -- -- --  TROPONINI -- -- -- --    Assessment:  88 yof on coumadin PTA for h/o afib and aortic valve replacement. Per patient and [facility], pt was on Coumadin 1mg  daily except 0.5 mg on Tuesdays and Fridays  INR 2.39 today, Hgb 10.9, RN reported some hemoptysis (AM>PM).  MD aware, no plans to hold Coumadin so pharmacy will continue dosing   Levaquin added 10/30 - potential increase in INR expected given drug-drug interaction with Coumadin  Goal of Therapy:  INR 2-3 Monitor platelets by anticoagulation protocol: Yes   Plan:   Coumadin 0.5mg  po x 1 tonight  Daily PT/INR  Monitor hemoptysis   Geoffry Paradise Thi 09/03/2012,10:17 AM

## 2012-09-03 NOTE — Progress Notes (Signed)
[facility] PROGRESS NOTE Interim History: [name] is an 78 y.o. female with prior hx of tobacco use, PSeudomonas PNA, COPD on home Oxygen, under hospice care with full code status, Hx of afib on chronic anticoagulation with INR 1.89 on admission, s/p pacer placement on pacerone and dig, Hx of AVR (pig), HTN, chronic steroid use being tapered to 6mg /day, presented to the ER with SOB, yellow sputum productive coughs and hemoptysis, but no chest pain or pleuritic pain for 5 days prior to admission. Started on cefepime and vanc.  Assessment/Plan: PNA (pneumonia) (09/01/2012) -continue cefepime and vancomycin.  Leukocytosis is worsening 2/2 to steroids, add levaquin. She does have a reaction unknown to quinolones. She has had at least 3 PNA in the last year, -Has remained afebrile. -saturation stable. -sputum cultures and blood cultures pending.  COPD with exacerbation (09/15/2011) -mild wheezing. Continue  O 2L. -continue steroids change to orals. -continue inhalers.  Pacemaker (03/12/2011) -paced rhythm.  Chronic anticoagulation (08/22/2011) -INR therapeutic.  Acute respiratory failure (09/02/2012) -she relates she still feels her SOB is better. -    Code Status: Full Family Communication: None at bedside  Disposition Plan: She has hospice services at home. Anticipate discharge home when better.   Consultants:  none  Procedures:  none  Antibiotics: Vanc, cefepime and levaquin 09/01/2012  HPI/Subjective: She relates her SOB is improved.   Objective: Filed Vitals:   09/02/12 2100 09/02/12 2342 09/03/12 0340 09/03/12 0604  BP: 139/54   113/54  Pulse:    62  Temp:    97.8 F (36.6 C)  TempSrc:    Oral  Resp:  18 18 18   Height:      Weight:    57.4 kg (126 lb 8.7 oz)  SpO2:    93%    Intake/Output Summary (Last 24 hours) at 09/03/12 0756 Last data filed at 09/03/12 0600  Gross per 24 hour  Intake   1260 ml  Output   1050 ml  Net    210 ml    Filed Weights   09/01/12 2103 09/03/12 0604  Weight: 57.3 kg (126 lb 5.2 oz) 57.4 kg (126 lb 8.7 oz)    Exam:  General: Alert, awake, oriented x3, in no acute distress.  HEENT: No bruits, no goiter.  Heart: Regular rate and rhythm, without murmurs, rubs, gallops.  Lungs: moderate air movement with wheezing bilateral.  Abdomen: Soft, nontender, nondistended, positive bowel sounds.  Neuro: Grossly intact, nonfocal.   Data Reviewed: Basic Metabolic Panel:  Lab 09/03/12 4098 09/01/12 1835  NA 132* 133*  K 3.5 4.0  CL 95* 94*  CO2 32 31  GLUCOSE 132* 109*  BUN 15 12  CREATININE 0.64 0.52  CALCIUM 8.9 9.1  MG 2.0 --  PHOS -- --   Liver Function Tests:  Lab 09/01/12 1835  AST 27  ALT 21  ALKPHOS 48  BILITOT 0.3  PROT 6.9  ALBUMIN 2.9*   No results found for this basename: LIPASE:5,AMYLASE:5 in the last 168 hours No results found for this basename: AMMONIA:5 in the last 168 hours CBC:  Lab 09/03/12 0139 09/01/12 1835  WBC 16.4* 10.9*  NEUTROABS -- --  HGB 10.9* 11.3*  HCT 33.6* 34.8*  MCV 83.2 82.3  PLT 239 241   Cardiac Enzymes: No results found for this basename: CKTOTAL:5,CKMB:5,CKMBINDEX:5,TROPONINI:5 in the last 168 hours BNP (last 3 results)  Basename 09/01/12 1905 12/10/11 1833 09/18/11 0630  PROBNP 796.0* 501.6* 944.2*   CBG: No results found for this  basename: GLUCAP:5 in the last 168 hours  Recent Results (from the past 240 hour(s))  CULTURE, EXPECTORATED SPUTUM-ASSESSMENT     Status: Normal   Collection Time   09/02/12  1:23 PM      Component Value Range Status Comment   Specimen Description SPUTUM   Final    Special Requests NONE   Final    Sputum evaluation     Final    Value: THIS SPECIMEN IS ACCEPTABLE. RESPIRATORY CULTURE REPORT TO FOLLOW.   Report Status 09/02/2012 FINAL   Final   CULTURE, RESPIRATORY     Status: Normal (Preliminary result)   Collection Time   09/02/12  1:23 PM      Component Value Range Status Comment    Specimen Description SPUTUM   Final    Special Requests NONE   Final    Gram Stain     Final    Value: RARE WBC PRESENT,BOTH PMN AND MONONUCLEAR     NO SQUAMOUS EPITHELIAL CELLS SEEN     ABUNDANT GRAM NEGATIVE RODS     RARE YEAST   Culture PENDING   Incomplete    Report Status PENDING   Incomplete      Studies: Dg Chest 2 View  09/01/2012  *RADIOLOGY REPORT*  Clinical Data: Weakness and shortness of breath.  Pneumonia.  CHEST - 2 VIEW  Comparison: One-view chest 09/01/2012.  Findings: Lateral right upper lobe pneumonia is again noted. Additional interstitial and airspace disease is present about the right hilum.  Emphysematous changes are stable.  The heart is mildly enlarged without significant edema or effusions to suggest failure.  The pacing wires stable.  IMPRESSION:  1.  Right lateral upper lobe pneumonia. 2.  Asymmetric right perihilar interstitial and airspace disease. 3.  Recommend follow-up chest radiograph to assure resolution of this airspace disease after appropriate treatment.   Original Report Authenticated By: Jamesetta Orleans. MATTERN, M.D.    Dg Chest Port 1 View  09/01/2012  *RADIOLOGY REPORT*  Clinical Data: Short of breath  PORTABLE CHEST - 1 VIEW  Comparison: 05/27/2012  Findings: COPD is present.  Right upper lobe pleural and parenchymal density appears more prominent.  Some of this may be due to projection and technique however I would suggest a follow-up two-view chest x-ray and if this area has progressed then a CT of the chest with contrast would be recommended.  Cardiac enlargement.  Negative for heart failure or effusion. Pacemaker is unchanged.  IMPRESSION: Right pleural and parenchymal density appears more prominent.  This may represent pneumonia or a tumor.  Follow-up two-view chest x-ray suggested.   Original Report Authenticated By: Camelia Phenes, M.D.     Scheduled Meds:   . albuterol  2.5 mg Nebulization Q4H  . antiseptic oral rinse  15 mL Mouth Rinse BID  .  ceFEPime (MAXIPIME) IV  1 g Intravenous Q12H  . digoxin  125 mcg Oral QPM  . diltiazem  240 mg Oral Daily  . ezetimibe  10 mg Oral QPM  . ferrous sulfate  325 mg Oral Q breakfast  . furosemide  20 mg Oral BID  . guaiFENesin  600 mg Oral BID  . hydrocortisone sod succinate (SOLU-CORTEF) injection  50 mg Intravenous Q8H  . montelukast  10 mg Oral QHS  . pantoprazole  40 mg Oral Daily  . predniSONE  20 mg Oral QAC breakfast  . senna  1 tablet Oral QHS  . sertraline  50 mg Oral Daily  . sodium chloride  3 mL Intravenous Q12H  . sodium chloride  3 mL Intravenous Q12H  . sotalol  80 mg Oral BID  . tiotropium  18 mcg Inhalation Daily  . vancomycin  500 mg Intravenous Q12H  . vitamin C  500 mg Oral Daily  . warfarin  1 mg Oral ONCE-1800  . Warfarin - Pharmacist Dosing Inpatient   Does not apply q1800  . DISCONTD: albuterol  2.5 mg Nebulization Q6H   Continuous Infusions:    Marinda Elk  [facility] Pager [phone].  If 8PM-8AM, please contact night-coverage at [email or web address], password [facility] 09/03/2012, 7:56 AM  LOS: 2 days

## 2012-09-03 NOTE — Progress Notes (Addendum)
Room 1425 - Trish Fountain - [facility] RN Visit-R.Kaitlinn Iversen RN  Related admission to [facility] diagnosis of COPD. Pt is  code.    Pt alert & oriented, sitting up in bed, with complaints of being extremely tired / exhausted.  Pt states she cannot think or talk straight she is so tired - also unable to fold the newspaper she is so weak. No family present-private paid caregiver present.   Pt complains of chronic pain in L/shoulder and R/hand, otherwise, no complaints of chest pain.  Pt states she gets and needs the breathing treatments all through the night which is what keeps her awake and tired, or she would be worse in her breathing.   Per pt  - she states her MD told her she would be in the hospital another 4 days.    Patient's home medication list is on shadow chart.   Please call [facility] @ [phone]- ask for RN Liaison or after hours,ask for on-call RN with any hospice needs.    Thank you.  Joneen Boers, RN  [facility]  Hospice Liaison

## 2012-09-03 NOTE — Progress Notes (Signed)
[facility]: Sw note:  Chart reviewed. Pt lying down in bed, paid caregiver at bedside. Pt reported that "I feel worse today than yesterday." Pt stated that she felt so bad because she was so tired, per Pt "I can't even think today." Pt stated that she was hopeful to get some rest tonight. Plan remains for Pt to return home at hospital discharge.  Sw provided active/reflective listening and emotional  Support during visit.  Lorain Childes, LCSW, ACHP-SW

## 2012-09-03 NOTE — Progress Notes (Signed)
Heart rhythm irregular on the monitor, pacing, HR controlled at 60's. Runs of pacer spikes noted on the strip, Andrea Poag NP was made aware. EKG done and showed ventricular pacing. Will continue to monitor patient.

## 2012-09-04 DIAGNOSIS — J449 Chronic obstructive pulmonary disease, unspecified: Secondary | ICD-10-CM

## 2012-09-04 DIAGNOSIS — R042 Hemoptysis: Secondary | ICD-10-CM

## 2012-09-04 DIAGNOSIS — J159 Unspecified bacterial pneumonia: Secondary | ICD-10-CM

## 2012-09-04 LAB — PROTIME-INR: INR: 2.26 — ABNORMAL HIGH (ref 0.00–1.49)

## 2012-09-04 MED ORDER — PREDNISONE 20 MG PO TABS
20.0000 mg | ORAL_TABLET | Freq: Every day | ORAL | Status: DC
  Administered 2012-09-04 – 2012-09-06 (×3): 20 mg via ORAL
  Filled 2012-09-04 (×4): qty 1

## 2012-09-04 MED ORDER — MOMETASONE FURO-FORMOTEROL FUM 200-5 MCG/ACT IN AERO
2.0000 | INHALATION_SPRAY | Freq: Two times a day (BID) | RESPIRATORY_TRACT | Status: DC
  Administered 2012-09-04 – 2012-09-08 (×8): 2 via RESPIRATORY_TRACT
  Filled 2012-09-04: qty 8.8

## 2012-09-04 MED ORDER — HYDROCORTISONE 1 % EX CREA
TOPICAL_CREAM | Freq: Two times a day (BID) | CUTANEOUS | Status: DC
  Administered 2012-09-04 – 2012-09-06 (×5): via TOPICAL
  Administered 2012-09-07: 1 via TOPICAL
  Administered 2012-09-07: 10:00:00 via TOPICAL
  Filled 2012-09-04: qty 28

## 2012-09-04 MED ORDER — SODIUM CHLORIDE 0.9 % IV SOLN: 250.0000 mL | INTRAVENOUS | Status: DC | PRN

## 2012-09-04 NOTE — Progress Notes (Addendum)
[facility] PROGRESS NOTE Interim History: [name] is an 78 y.o. female with prior hx of tobacco use, PSeudomonas PNA, COPD on home Oxygen, under hospice care with full code status, Hx of afib on chronic anticoagulation with INR 1.89 on admission, s/p pacer placement on pacerone and dig, Hx of AVR (pig), HTN, chronic steroid use being tapered to 6mg /day, presented to the ER with SOB, yellow sputum productive coughs and hemoptysis, but no chest pain or pleuritic pain for 5 days prior to admission. Started on cefepime and vanc.  Assessment/Plan: PNA (pneumonia) (09/01/2012) -continue cefepime,vancomycin and levaquin.  -Has remained afebrile. Respiration rate is decreasing -saturation stable. -sputum cultures and blood cultures pending.  COPD with exacerbation (09/15/2011) -mild wheezing. Continue  O 2L. -continue steroids change to orals. -continue inhalers.  Pacemaker (03/12/2011) -paced rhythm.  Chronic anticoagulation (08/22/2011) -INR therapeutic. Hold coumadin as she continues to have hemoptysis. She is on 3 antibiotics.  Monitor INR.  Acute respiratory failure (09/02/2012) -she relates she still feels her SOB is better.   Code Status: Full Family Communication: None at bedside  Disposition Plan: She has hospice services at home. Anticipate discharge home when better.   Consultants:  none  Procedures:  none  Antibiotics: Vanc, cefepime and levaquin 09/01/2012  HPI/Subjective: She relates her SOB is improved.   Objective: Filed Vitals:   09/04/12 0041 09/04/12 0445 09/04/12 0536 09/04/12 0655  BP:   135/62 131/76  Pulse:   61 76  Temp:   98.2 F (36.8 C) 98 F (36.7 C)  TempSrc:   Oral Oral  Resp:   20 20  Height:      Weight:   57.6 kg (126 lb 15.8 oz)   SpO2: 95% 93% 94% 96%    Intake/Output Summary (Last 24 hours) at 09/04/12 0745 Last data filed at 09/04/12 0503  Gross per 24 hour  Intake   1050 ml  Output   1550 ml  Net   -500 ml    Filed Weights   09/01/12 2103 09/03/12 0604 09/04/12 0536  Weight: 57.3 kg (126 lb 5.2 oz) 57.4 kg (126 lb 8.7 oz) 57.6 kg (126 lb 15.8 oz)    Exam:  General: Alert, awake, oriented x3, in no acute distress.  HEENT: No bruits, no goiter.  Heart: Regular rate and rhythm, without murmurs, rubs, gallops.  Lungs: moderate air movement with wheezing bilateral.  Abdomen: Soft, nontender, nondistended, positive bowel sounds.  Neuro: Grossly intact, nonfocal.   Data Reviewed: Basic Metabolic Panel:  Lab 09/03/12 1610 09/01/12 1835  NA 132* 133*  K 3.5 4.0  CL 95* 94*  CO2 32 31  GLUCOSE 132* 109*  BUN 15 12  CREATININE 0.64 0.52  CALCIUM 8.9 9.1  MG 2.0 --  PHOS -- --   Liver Function Tests:  Lab 09/01/12 1835  AST 27  ALT 21  ALKPHOS 48  BILITOT 0.3  PROT 6.9  ALBUMIN 2.9*   No results found for this basename: LIPASE:5,AMYLASE:5 in the last 168 hours No results found for this basename: AMMONIA:5 in the last 168 hours CBC:  Lab 09/03/12 0139 09/01/12 1835  WBC 16.4* 10.9*  NEUTROABS -- --  HGB 10.9* 11.3*  HCT 33.6* 34.8*  MCV 83.2 82.3  PLT 239 241   Cardiac Enzymes: No results found for this basename: CKTOTAL:5,CKMB:5,CKMBINDEX:5,TROPONINI:5 in the last 168 hours BNP (last 3 results)  Basename 09/01/12 1905 12/10/11 1833 09/18/11 0630  PROBNP 796.0* 501.6* 944.2*   CBG: No results found for  this basename: GLUCAP:5 in the last 168 hours  Recent Results (from the past 240 hour(s))  CULTURE, EXPECTORATED SPUTUM-ASSESSMENT     Status: Normal   Collection Time   09/02/12  1:23 PM      Component Value Range Status Comment   Specimen Description SPUTUM   Final    Special Requests NONE   Final    Sputum evaluation     Final    Value: THIS SPECIMEN IS ACCEPTABLE. RESPIRATORY CULTURE REPORT TO FOLLOW.   Report Status 09/02/2012 FINAL   Final   CULTURE, RESPIRATORY     Status: Normal (Preliminary result)   Collection Time   09/02/12  1:23 PM       Component Value Range Status Comment   Specimen Description SPUTUM   Final    Special Requests NONE   Final    Gram Stain     Final    Value: RARE WBC PRESENT,BOTH PMN AND MONONUCLEAR     NO SQUAMOUS EPITHELIAL CELLS SEEN     ABUNDANT GRAM NEGATIVE RODS     RARE YEAST   Culture PENDING   Incomplete    Report Status PENDING   Incomplete      Studies: No results found.  Scheduled Meds:    . acetaminophen      . albuterol  2.5 mg Nebulization Q4H  . antiseptic oral rinse  15 mL Mouth Rinse BID  . ceFEPime (MAXIPIME) IV  1 g Intravenous Q12H  . digoxin  125 mcg Oral QPM  . diltiazem  240 mg Oral Daily  . ezetimibe  10 mg Oral QPM  . ferrous sulfate  325 mg Oral Q breakfast  . guaiFENesin  600 mg Oral BID  . levofloxacin (LEVAQUIN) IV  750 mg Intravenous Q24H  . montelukast  10 mg Oral QHS  . pantoprazole  40 mg Oral Daily  . predniSONE  20 mg Oral QAC breakfast  . senna  1 tablet Oral QHS  . sertraline  50 mg Oral Daily  . sodium chloride  3 mL Intravenous Q12H  . sodium chloride  3 mL Intravenous Q12H  . sotalol  80 mg Oral BID  . tiotropium  18 mcg Inhalation Daily  . vancomycin  500 mg Intravenous Q12H  . vitamin C  500 mg Oral Daily  . warfarin  0.5 mg Oral ONCE-1800  . Warfarin - Pharmacist Dosing Inpatient   Does not apply q1800  . DISCONTD: furosemide  20 mg Oral BID  . DISCONTD: predniSONE  20 mg Oral QAC breakfast  . DISCONTD: predniSONE  40 mg Oral QAC breakfast   Continuous Infusions:    Marinda Elk  [facility] Pager [phone].  If 8PM-8AM, please contact night-coverage at [email or web address], password [facility] 09/04/2012, 7:45 AM  LOS: 3 days

## 2012-09-04 NOTE — Evaluation (Signed)
Physical Therapy Evaluation Patient Details Name: [name] MRN: [id number] DOB: 06-05-1924 Today's Date: 09/04/2012 Time: 1478-2956 PT Time Calculation (min): 19 min  PT Assessment / Plan / Recommendation Clinical Impression  78 y.o. female with COPD on home Oxygen, under hospice care with full code status, Hx of afib, pacemaker admitted with PNA. Pt performed bed to chair transfer with min assist. Pt has 24* private aide at home and has needed DME. Expect pt can return home with 24* care and HHPT. She would benefit from acute PT to maximize safety and independence with mobiilty.     PT Assessment  Patient needs continued PT services    Follow Up Recommendations  Home health PT;Supervision/Assistance - 24 hour    Does the patient have the potential to tolerate intense rehabilitation      Barriers to Discharge None      Equipment Recommendations  None recommended by PT    Recommendations for Other Services     Frequency Min 3X/week    Precautions / Restrictions Precautions Precaution Comments: on O2 Restrictions Weight Bearing Restrictions: No   Pertinent Vitals/Pain *0/10 SaO2 94% on 4L O2**      Mobility  Bed Mobility Bed Mobility: Supine to Sit Supine to Sit: 6: Modified independent (Device/Increase time);HOB elevated Transfers Transfers: Sit to Stand;Stand to Sit;Stand Pivot Transfers Sit to Stand: From bed;4: Min assist;With upper extremity assist Stand to Sit: 5: Supervision;To chair/3-in-1;With upper extremity assist;With armrests Stand Pivot Transfers: 4: Min guard Details for Transfer Assistance: min/guard assist for balance Ambulation/Gait Ambulation/Gait Assistance: Not tested (comment);Other (comment) (pt declined ambulation 2* wanting to watch her soap opera)    Shoulder Instructions     Exercises     PT Diagnosis: Generalized weakness  PT Problem List: Decreased activity tolerance;Decreased mobility PT Treatment Interventions: Gait  training;Functional mobility training;Patient/family education;Stair training;Therapeutic exercise   PT Goals Acute Rehab PT Goals PT Goal Formulation: With patient Time For Goal Achievement: 09/18/12 Potential to Achieve Goals: Fair Pt will Transfer Bed to Chair/Chair to Bed: with supervision PT Transfer Goal: Bed to Chair/Chair to Bed - Progress: Goal set today Pt will Ambulate: 16 - 50 feet;with least restrictive assistive device;with min assist PT Goal: Ambulate - Progress: Goal set today Pt will Go Up / Down Stairs: 6-9 stairs;with min assist PT Goal: Up/Down Stairs - Progress: Goal set today Pt will Perform Home Exercise Program: Independently PT Goal: Perform Home Exercise Program - Progress: Goal set today  Visit Information  Last PT Received On: 09/04/12 Assistance Needed: +1    Subjective Data  Subjective: I'm falling apart bit by bit.  Patient Stated Goal: return to prior level of function at home   Prior Functioning  Home Living Lives With: Alone Available Help at Discharge: Personal care attendant;Available 24 hours/day Type of Home: House Home Access: Stairs to enter [employer] of Steps: 6 Home Layout: One level Home Adaptive Equipment: Shower chair without back;Wheelchair - manual;Walker - rolling;Bedside commode/3-in-1 Prior Function Level of Independence: Needs assistance (uses RW when dizzy) Needs Assistance: Bathing;Dressing;Light Housekeeping;Meal Prep Communication Communication: No difficulties    Cognition  Overall Cognitive Status: Appears within functional limits for tasks assessed/performed Arousal/Alertness: Awake/alert Orientation Level: Appears intact for tasks assessed Behavior During Session: [facility] for tasks performed    Extremity/Trunk Assessment Right Upper Extremity Assessment RUE ROM/Strength/Tone: [facility] for tasks assessed (ulnar nerve neuropathy per pt) Left Upper Extremity Assessment LUE ROM/Strength/Tone: WFL for tasks  assessed Right Lower Extremity Assessment RLE ROM/Strength/Tone: Within functional levels  RLE Sensation: WFL - Light Touch RLE Coordination: WFL - gross/fine motor Left Lower Extremity Assessment LLE ROM/Strength/Tone: Within functional levels LLE Sensation: WFL - Light Touch LLE Coordination: WFL - gross/fine motor Trunk Assessment Trunk Assessment: Normal   Balance Balance Balance Assessed: Yes Static Sitting Balance Static Sitting - Balance Support: Feet supported;No upper extremity supported Static Sitting - Level of Assistance: 7: Independent Static Sitting - Comment/# of Minutes: 3  End of Session PT - End of Session Equipment Utilized During Treatment: Oxygen Activity Tolerance: Patient tolerated treatment well Patient left: in chair;with call bell/phone within reach;with family/visitor present Nurse Communication: Mobility status  GP     [name] 09/04/2012, 1:57 PM [phone]

## 2012-09-04 NOTE — Progress Notes (Addendum)
Room 1425 - Trish Fountain - [facility] RN Visit-R.Andrea Mulhearn RN  Related admission to [facility] diagnosis of COPD.  Pt is FULL code.    Pt alert & oriented, lying in bed, lethargic with no complaints of pain but continues with complaints of weakness - states she was up only once during the night with respiratory treatments due to wheezing, SOB.    Staff RN and pvt pd home caregiver present. Per staff RN, anticipate  Discharge.in 2-4 days - confirmed by conversation with Dr. Robb Matar.   Pt will travel home via [place] due to weakness, desatting with exertion and for safety.  Follow up:  Pt's dtr called [facility] and this RN was notified to call dtr - made visit to the room instead and talked with dtr.  She requested review of conversation with her mom this am during my original visit.  We talked about reality of pt feeling sad that she can't seem to "bounce back".  We used the illustration of stairs and with each episode of PNA / exacerbations of COPD, there is a step down and patient admits she is unable to get back up to the prior status (back up a step) At this point, we try to stay on that step as long as possible.  Pt admits being very proud to have lasted 3+ months without having to come to the hospital or having an episode of PNA/exacerbation.  She acknowledged she is on another step (lower) now and was encouraged with the challenge of staying on that step as long as she can-staying as healthy as she can, staying safe and preserving her energy and oxygen by accepting assistance with her ADL's, using BSC, etc.  Dtr & pt appreciated follow up visit.    Patient's home medication list is on shadow chart.   Please call [facility] @ [phone]- ask for RN Liaison or after hours,ask for on-call RN with any hospice needs.    Thank you.  Andrea Boers, RN  [facility]  Hospice Liaison

## 2012-09-04 NOTE — Progress Notes (Signed)
ANTIBIOTIC CONSULT NOTE - FOLLOW UP  Pharmacy Consult for Vancomycin, Cefepime Indication: PNA  Allergies  Allergen Reactions  . Cephalexin     Unsure as to reaction.  ? Hives with Cephalexin; "I can take Penicillin"  . Atorvastatin     Forgetfulness   . Azithromycin Nausea And Vomiting  . Codeine Nausea And Vomiting  . Crestor (Rosuvastatin Calcium)     Forgetfulness    . Estradiol     Unknown reaction  . Hydrocortisone Hives    Flea bites were mistaken as rash from hydrocortisone  . Lovastatin Other (See Comments)    forgetfullness,  . Moxifloxacin     Unknown reaction  . Sulfonamide Derivatives     itching   Labs:  Basename 09/03/12 0139 09/01/12 1835  WBC 16.4* 10.9*  HGB 10.9* 11.3*  PLT 239 241  LABCREA -- --  CREATININE 0.64 0.52   Assessment:  3 yof with h/o end stage COPD, home hospice, diagnosed with PNA PTA without improvement on Augmentin.  Patient with h/o pseudomonas PNA in Feb and July of this year per EPIC.  Patient is on D#3 of Vancomycin, Cefepime, and D2 levaquin. Latest culture with intermediate susceptibility to to Cipro (sensitive to Cefepime).  Afebrile, WBC 16.4K on 10/30 (pt was on prednisone), Scr wnl with CG 41 ml/min, N 57 ml/min  Sputum culture growing moderate GNR (pending speciation/sensitivities)  Goal of Therapy:  Vancomycin trough level 15-20 mcg/ml Appropriate renal dosing of antibiotics  Plan:   Continue vancomycin 500 mg IV q12h and Cefepime 1g q12h as ordered.    MD consider narrowing antibiotics now that sputum cx growing GNR. Previous sputum culture with intermediate sensitivity to ciprofloxacin, a FQ).   Pharmacy will check a vancomycin trough in the next 1-2 days if vancomycin continues  Geoffry Paradise, PharmD, BCPS Pager: [phone] 1:09 PM Pharmacy #: [phone]

## 2012-09-05 LAB — BASIC METABOLIC PANEL
BUN: 11 mg/dL (ref 6–23)
Chloride: 98 mEq/L (ref 96–112)
Glucose, Bld: 93 mg/dL (ref 70–99)
Potassium: 3.6 mEq/L (ref 3.5–5.1)

## 2012-09-05 LAB — CBC
HCT: 35.3 % — ABNORMAL LOW (ref 36.0–46.0)
Hemoglobin: 11.2 g/dL — ABNORMAL LOW (ref 12.0–15.0)
RBC: 4.22 MIL/uL (ref 3.87–5.11)
WBC: 10.5 10*3/uL (ref 4.0–10.5)

## 2012-09-05 LAB — CULTURE, RESPIRATORY W GRAM STAIN

## 2012-09-05 MED ORDER — CIPROFLOXACIN HCL 500 MG PO TABS
500.0000 mg | ORAL_TABLET | Freq: Two times a day (BID) | ORAL | Status: DC
  Administered 2012-09-05 – 2012-09-06 (×2): 500 mg via ORAL
  Filled 2012-09-05 (×4): qty 1

## 2012-09-05 MED ORDER — SODIUM CHLORIDE 0.9 % IV SOLN: 250.0000 mL | INTRAVENOUS | Status: DC | PRN

## 2012-09-05 NOTE — Progress Notes (Signed)
Physical Therapy Treatment Patient Details Name: [name] MRN: [id number] DOB: 09/03/1924 Today's Date: 09/05/2012 Time: 1040-1106 PT Time Calculation (min): 26 min  PT Assessment / Plan / Recommendation Comments on Treatment Session  Good progress with mobility today. Pt ambulated 60' with 4 L O2 and RW, SaO2 90%.     Follow Up Recommendations  Home health PT;Supervision/Assistance - 24 hour     Does the patient have the potential to tolerate intense rehabilitation     Barriers to Discharge        Equipment Recommendations  None recommended by PT    Recommendations for Other Services    Frequency Min 3X/week   Plan      Precautions / Restrictions Precautions Precaution Comments: on O2 Restrictions Weight Bearing Restrictions: No   Pertinent Vitals/Pain *pt denied pain SaO2 90% on 4L O2 while walking**    Mobility  Bed Mobility Bed Mobility: Supine to Sit Supine to Sit: 6: Modified independent (Device/Increase time);HOB elevated;With rails Transfers Transfers: Sit to Stand;Stand to Sit Sit to Stand: From bed;With upper extremity assist;4: Min guard Stand to Sit: 5: Supervision;To chair/3-in-1;With upper extremity assist;With armrests Details for Transfer Assistance: min/guard assist for balance Ambulation/Gait Ambulation/Gait Assistance: 5: Supervision Ambulation Distance (Feet): 60 Feet Assistive device: Rolling walker Gait Pattern: Within Functional Limits Gait velocity: decreased General Gait Details: no LOB; distance limited by fatigue; pt on 4L O2 while walking; SaO2 90%    Exercises     PT Diagnosis:    PT Problem List:   PT Treatment Interventions:     PT Goals Acute Rehab PT Goals PT Goal Formulation: With patient Time For Goal Achievement: 09/18/12 Potential to Achieve Goals: Fair Pt will Transfer Bed to Chair/Chair to Bed: with supervision Pt will Ambulate: with least restrictive assistive device;with min assist;51 - 150 feet PT Goal:  Ambulate - Progress: Updated due to goal met Pt will Go Up / Down Stairs: 6-9 stairs;with min assist Pt will Perform Home Exercise Program: Independently  Visit Information  Last PT Received On: 09/05/12 Assistance Needed: +1    Subjective Data  Subjective: I need to keep my oxygen in the 90's or else I don't do well.  Patient Stated Goal: do walk more   Cognition  Overall Cognitive Status: Appears within functional limits for tasks assessed/performed Arousal/Alertness: Awake/alert Orientation Level: Appears intact for tasks assessed Behavior During Session: [facility] for tasks performed    Balance  Balance Balance Assessed: Yes Static Sitting Balance Static Sitting - Balance Support: Feet supported;No upper extremity supported Static Sitting - Level of Assistance: 7: Independent Static Sitting - Comment/# of Minutes: 2  End of Session PT - End of Session Equipment Utilized During Treatment: Oxygen Activity Tolerance: Patient tolerated treatment well Patient left: in chair;with call bell/phone within reach Nurse Communication: Mobility status   GP     [name] 09/05/2012, 11:13 AM [phone]

## 2012-09-05 NOTE — Progress Notes (Signed)
[facility] PROGRESS NOTE Interim History: [name] is an 78 y.o. female with prior hx of tobacco use, PSeudomonas PNA, COPD on home Oxygen, under hospice care with full code status, Hx of afib on chronic anticoagulation with INR 1.89 on admission, s/p pacer placement on pacerone and dig, Hx of AVR (pig), HTN, chronic steroid use being tapered to 6mg /day, presented to the ER with SOB, yellow sputum productive coughs and hemoptysis, but no chest pain or pleuritic pain for 5 days prior to admission. Started on cefepime and vanc.  Assessment/Plan: PNA (pneumonia) (09/01/2012) -10.29.2013 cefepime,vancomycin and levaquin.  -Has remained afebrile. Respiration rate is decreasing -10.28.2013 sputum cultures moderate gram negative rod and blood cultures pending.  COPD with exacerbation (09/15/2011) -mild wheezing. Continue  O 2L. -continue steroids change to orals. -continue inhalers.  Pacemaker (03/12/2011) -paced rhythm.  Chronic anticoagulation (08/22/2011) -INR therapeutic. Hold coumadin as she continues to have hemoptysis. She is on 3 antibiotics.  Monitor INR.  Acute respiratory failure (09/02/2012) -she relates she still feels her SOB is better.   Code Status: Full Family Communication: None at bedside  Disposition Plan: She has hospice services at home. Anticipate discharge home when better. PT for home.   Consultants:  none  Procedures:  none  Antibiotics: Vanc, cefepime and levaquin 09/01/2012  HPI/Subjective: She relates she feels tired.   Objective: Filed Vitals:   09/04/12 1543 09/04/12 2045 09/04/12 2106 09/05/12 0500  BP:   144/56 117/58  Pulse:   68 61  Temp:   98 F (36.7 C) 97.8 F (36.6 C)  TempSrc:   Oral Oral  Resp:   20 20  Height:      Weight:    58.3 kg (128 lb 8.5 oz)  SpO2: 97% 94% 95% 96%    Intake/Output Summary (Last 24 hours) at 09/05/12 0824 Last data filed at 09/05/12 0755  Gross per 24 hour  Intake    810 ml    Output   1870 ml  Net  -1060 ml   Filed Weights   09/03/12 0604 09/04/12 0536 09/05/12 0500  Weight: 57.4 kg (126 lb 8.7 oz) 57.6 kg (126 lb 15.8 oz) 58.3 kg (128 lb 8.5 oz)    Exam:  General: Alert, awake, oriented x3, in no acute distress.  HEENT: No bruits, no goiter.  Heart: Regular rate and rhythm, without murmurs, rubs, gallops.  Lungs: moderate air movement , clear to auscultation.  Abdomen: Soft, nontender, nondistended, positive bowel sounds.  Neuro: Grossly intact, nonfocal.   Data Reviewed: Basic Metabolic Panel:  Lab 09/05/12 1610 09/03/12 0139 09/01/12 1835  NA 137 132* 133*  K 3.6 3.5 4.0  CL 98 95* 94*  CO2 32 32 31  GLUCOSE 93 132* 109*  BUN 11 15 12   CREATININE 0.49* 0.64 0.52  CALCIUM 9.2 8.9 9.1  MG -- 2.0 --  PHOS -- -- --   Liver Function Tests:  Lab 09/01/12 1835  AST 27  ALT 21  ALKPHOS 48  BILITOT 0.3  PROT 6.9  ALBUMIN 2.9*   No results found for this basename: LIPASE:5,AMYLASE:5 in the last 168 hours No results found for this basename: AMMONIA:5 in the last 168 hours CBC:  Lab 09/05/12 0430 09/03/12 0139 09/01/12 1835  WBC 10.5 16.4* 10.9*  NEUTROABS -- -- --  HGB 11.2* 10.9* 11.3*  HCT 35.3* 33.6* 34.8*  MCV 83.6 83.2 82.3  PLT 253 239 241   Cardiac Enzymes: No results found for this basename: CKTOTAL:5,CKMB:5,CKMBINDEX:5,TROPONINI:5 in the  last 168 hours BNP (last 3 results)  Basename 09/01/12 1905 12/10/11 1833 09/18/11 0630  PROBNP 796.0* 501.6* 944.2*   CBG: No results found for this basename: GLUCAP:5 in the last 168 hours  Recent Results (from the past 240 hour(s))  CULTURE, EXPECTORATED SPUTUM-ASSESSMENT     Status: Normal   Collection Time   09/02/12  1:23 PM      Component Value Range Status Comment   Specimen Description SPUTUM   Final    Special Requests NONE   Final    Sputum evaluation     Final    Value: THIS SPECIMEN IS ACCEPTABLE. RESPIRATORY CULTURE REPORT TO FOLLOW.   Report Status 09/02/2012  FINAL   Final   CULTURE, RESPIRATORY     Status: Normal (Preliminary result)   Collection Time   09/02/12  1:23 PM      Component Value Range Status Comment   Specimen Description SPUTUM   Final    Special Requests NONE   Final    Gram Stain     Final    Value: RARE WBC PRESENT,BOTH PMN AND MONONUCLEAR     NO SQUAMOUS EPITHELIAL CELLS SEEN     ABUNDANT GRAM NEGATIVE RODS     RARE YEAST   Culture MODERATE GRAM NEGATIVE RODS   Final    Report Status PENDING   Incomplete      Studies: No results found.  Scheduled Meds:    . albuterol  2.5 mg Nebulization Q4H  . antiseptic oral rinse  15 mL Mouth Rinse BID  . ceFEPime (MAXIPIME) IV  1 g Intravenous Q12H  . digoxin  125 mcg Oral QPM  . diltiazem  240 mg Oral Daily  . ezetimibe  10 mg Oral QPM  . ferrous sulfate  325 mg Oral Q breakfast  . guaiFENesin  600 mg Oral BID  . hydrocortisone cream   Topical BID  . levofloxacin (LEVAQUIN) IV  750 mg Intravenous Q24H  . mometasone-formoterol  2 puff Inhalation BID  . montelukast  10 mg Oral QHS  . pantoprazole  40 mg Oral Daily  . predniSONE  20 mg Oral Q breakfast  . senna  1 tablet Oral QHS  . sertraline  50 mg Oral Daily  . sodium chloride  3 mL Intravenous Q12H  . sodium chloride  3 mL Intravenous Q12H  . sotalol  80 mg Oral BID  . tiotropium  18 mcg Inhalation Daily  . vancomycin  500 mg Intravenous Q12H  . vitamin C  500 mg Oral Daily   Continuous Infusions:    Marinda Elk  [facility] Pager [phone].  If 8PM-8AM, please contact night-coverage at [email or web address], password [facility] 09/05/2012, 8:24 AM  LOS: 4 days

## 2012-09-05 NOTE — Progress Notes (Signed)
Room 1425 - Mrs [name]- [facility] RN Visit-R.Jelicia Nantz RN  Related admission to [facility] diagnosis ofCOPD.  Pt is FULL code.    Pt alert & oriented, sitting up in bed, without complaints of pain or discomfort. Pt continues to complain of just being very tired.  Pt continues to get Neb treatments for breathing around the clock.   No family present.   Patient's home medication list is on shadow chart.   Please call [facility] @ [phone]- ask for RN Liaison or after hours,ask for on-call RN with any hospice needs.    Thank you.  Joneen Boers, RN  [facility]  Hospice Liaison

## 2012-09-06 ENCOUNTER — Inpatient Hospital Stay (HOSPITAL_COMMUNITY)

## 2012-09-06 DIAGNOSIS — J151 Pneumonia due to Pseudomonas: Principal | ICD-10-CM

## 2012-09-06 LAB — BASIC METABOLIC PANEL
BUN: 11 mg/dL (ref 6–23)
CO2: 34 mEq/L — ABNORMAL HIGH (ref 19–32)
Chloride: 98 mEq/L (ref 96–112)
Creatinine, Ser: 0.56 mg/dL (ref 0.50–1.10)
Potassium: 3.5 mEq/L (ref 3.5–5.1)

## 2012-09-06 LAB — PROTIME-INR: Prothrombin Time: 22.1 seconds — ABNORMAL HIGH (ref 11.6–15.2)

## 2012-09-06 MED ORDER — IOHEXOL 300 MG/ML  SOLN
80.0000 mL | Freq: Once | INTRAMUSCULAR | Status: AC | PRN
  Administered 2012-09-06: 80 mL via INTRAVENOUS

## 2012-09-06 MED ORDER — PREDNISONE 50 MG PO TABS
50.0000 mg | ORAL_TABLET | Freq: Every day | ORAL | Status: DC
  Administered 2012-09-07 – 2012-09-08 (×2): 50 mg via ORAL
  Filled 2012-09-06 (×5): qty 1

## 2012-09-06 MED ORDER — METHYLPREDNISOLONE SODIUM SUCC 125 MG IJ SOLR
125.0000 mg | Freq: Two times a day (BID) | INTRAMUSCULAR | Status: AC
  Administered 2012-09-06 (×2): 125 mg via INTRAVENOUS
  Filled 2012-09-06 (×2): qty 2

## 2012-09-06 MED ORDER — DEXTROSE 5 % IV SOLN
2.0000 g | INTRAVENOUS | Status: DC
  Filled 2012-09-06: qty 2

## 2012-09-06 MED ORDER — DEXTROSE 5 % IV SOLN
2.0000 g | Freq: Once | INTRAVENOUS | Status: AC
  Administered 2012-09-06: 2 g via INTRAVENOUS
  Filled 2012-09-06: qty 2

## 2012-09-06 NOTE — Progress Notes (Addendum)
ANTIBIOTIC CONSULT NOTE - FOLLOW UP  Pharmacy Consult: Cefepime Indication: Psuedomonas PNA  Allergies  Allergen Reactions  . Cephalexin     Unsure as to reaction.  ? Hives with Cephalexin; "I can take Penicillin"  . Atorvastatin     Forgetfulness   . Azithromycin Nausea And Vomiting  . Codeine Nausea And Vomiting  . Crestor (Rosuvastatin Calcium)     Forgetfulness    . Estradiol     Unknown reaction  . Hydrocortisone Hives    Flea bites were mistaken as rash from hydrocortisone  . Lovastatin Other (See Comments)    forgetfullness,  . Moxifloxacin     Unknown reaction  . Sulfonamide Derivatives     itching   Labs:  Basename 09/06/12 0523 09/05/12 0430  WBC -- 10.5  HGB -- 11.2*  PLT -- 253  LABCREA -- --  CREATININE 0.56 0.49*   Assessment:  72 yof with h/o end stage COPD, home hospice, diagnosed with PNA PTA without improvement on Augmentin.   Sputum culture from 10/29 growing moderate pseudomonas sensitive to Cipro/ Cefepime/imipenem.  Patient was changed to oral cipro on 11/1 but pulmonary status is worsening - change back to IV Cefepime today.  Afebrile, WBC down to WNL on 11/1.  Patient receiving prednisone. Scr wnl with CG 40 ml/min, N 57 ml/min  Goal of Therapy:  Appropriate renal dosing of antibiotics  Plan:   Cefepime 2 grams IV q24h   Will Monitor renal function   [name], Loma Messing PharmD Pager #: [phone] 10:30 AM 09/06/2012

## 2012-09-06 NOTE — Progress Notes (Signed)
Full Code.  Related admission.  Patient awake and alert sitting up in the bed.  No issues with pain at this time.  Patient reports not feeling well today.  Continues with hemoptysis and believes it is related to the breathing treatments that have been discontinued.  Both daughters present at bedside.  Daughter [name] is requesting follow up from MD or hospice RN that makes a visit tomorrow on 09/06/12.  Please call [facility] [phone] with any questions or concerns.   Willette Pa, RN [facility]

## 2012-09-06 NOTE — Progress Notes (Signed)
[facility] PROGRESS NOTE Interim History: [name] is an 78 y.o. female with prior hx of tobacco use, PSeudomonas PNA, COPD on home Oxygen, under hospice care with full code status, Hx of afib on chronic anticoagulation with INR 1.89 on admission, s/p pacer placement on pacerone and dig, Hx of AVR (pig), HTN, chronic steroid use being tapered to 6mg /day, presented to the ER with SOB, yellow sputum productive coughs and hemoptysis, but no chest pain or pleuritic pain for 5 days prior to admission. Started on cefepime levaquin and vanc. Cultures showed P. Aeruginosa sensitive to cipro. After deescalating antibiotcs patient respirations worsen. Cipro was continued and cefepime added.   Assessment/Plan: Acute respiratory failure (09/02/2012) -she feels more SOB today, with Wheezing Physical exam. -increase steroids. -continue O2.  PNA (pneumonia) (09/01/2012) -10.29.2013 cefepime,vancomycin and levaquin, deescalated to cipro once sensitivities back 11.1.2013. Add back cefepime as patient pulmonary status is worsening. She is more SOB and wheezing and crackles on exam, also desating with ambulation. -Has remained afebrile. Respiration rate increasing, saturations dropping. -10.28.2013 sputum cultures P.  Aeruginosa sensitive to cipro and blood cultures pending. -Ct chest to evaluate lung parenchyma ? Abscess, necrotizing PNA  COPD with exacerbation (09/15/2011) -mild wheezing with crackles. Continue  O 2L. -increase steroids. -continue inhalers. -ct chest.  Pacemaker (03/12/2011) -paced rhythm.  Chronic anticoagulation (08/22/2011) -INR therapeutic. Hold coumadin as she continues to have hemoptysis. She is on 2 antibiotics.  Monitor INR.  Code Status: Full Family Communication: None at bedside  Disposition Plan: She has hospice services at home. Anticipate discharge home when better. PT for home.   Consultants:  none  Procedures:  none  Antibiotics: Vanc, cefepime  and levaquin 09/01/2012  HPI/Subjective: She relates she feels tired, weak and SOB. She continues to have hemoptysis and bright red blood.   Objective: Filed Vitals:   09/05/12 2344 09/06/12 0355 09/06/12 0527 09/06/12 0820  BP:   141/73   Pulse:   68   Temp:   97.7 F (36.5 C)   TempSrc:   Oral   Resp:   18   Height:      Weight:      SpO2: 95% 96% 96% 95%    Intake/Output Summary (Last 24 hours) at 09/06/12 0848 Last data filed at 09/06/12 0556  Gross per 24 hour  Intake    840 ml  Output   1425 ml  Net   -585 ml   Filed Weights   09/03/12 0604 09/04/12 0536 09/05/12 0500  Weight: 57.4 kg (126 lb 8.7 oz) 57.6 kg (126 lb 15.8 oz) 58.3 kg (128 lb 8.5 oz)    Exam:  General: Alert, awake, oriented x3, in no acute distress.  HEENT: No bruits, no goiter.  Heart: Regular rate and rhythm, without murmurs, rubs, gallops.  Lungs: moderate air movement , wheezing and crackles B/L Abdomen: Soft, nontender, nondistended, positive bowel sounds.  Neuro: Grossly intact, nonfocal.   Data Reviewed: Basic Metabolic Panel:  Lab 09/06/12 1610 09/05/12 0430 09/03/12 0139 09/01/12 1835  NA 138 137 132* 133*  K 3.5 3.6 3.5 4.0  CL 98 98 95* 94*  CO2 34* 32 32 31  GLUCOSE 88 93 132* 109*  BUN 11 11 15 12   CREATININE 0.56 0.49* 0.64 0.52  CALCIUM 9.0 9.2 8.9 9.1  MG -- -- 2.0 --  PHOS -- -- -- --   Liver Function Tests:  Lab 09/01/12 1835  AST 27  ALT 21  ALKPHOS 48  BILITOT 0.3  PROT 6.9  ALBUMIN 2.9*   No results found for this basename: LIPASE:5,AMYLASE:5 in the last 168 hours No results found for this basename: AMMONIA:5 in the last 168 hours CBC:  Lab 09/05/12 0430 09/03/12 0139 09/01/12 1835  WBC 10.5 16.4* 10.9*  NEUTROABS -- -- --  HGB 11.2* 10.9* 11.3*  HCT 35.3* 33.6* 34.8*  MCV 83.6 83.2 82.3  PLT 253 239 241   Cardiac Enzymes: No results found for this basename: CKTOTAL:5,CKMB:5,CKMBINDEX:5,TROPONINI:5 in the last 168 hours BNP (last 3  results)  Basename 09/01/12 1905 12/10/11 1833 09/18/11 0630  PROBNP 796.0* 501.6* 944.2*   CBG: No results found for this basename: GLUCAP:5 in the last 168 hours  Recent Results (from the past 240 hour(s))  CULTURE, EXPECTORATED SPUTUM-ASSESSMENT     Status: Normal   Collection Time   09/02/12  1:23 PM      Component Value Range Status Comment   Specimen Description SPUTUM   Final    Special Requests NONE   Final    Sputum evaluation     Final    Value: THIS SPECIMEN IS ACCEPTABLE. RESPIRATORY CULTURE REPORT TO FOLLOW.   Report Status 09/02/2012 FINAL   Final   CULTURE, RESPIRATORY     Status: Normal   Collection Time   09/02/12  1:23 PM      Component Value Range Status Comment   Specimen Description SPUTUM   Final    Special Requests NONE   Final    Gram Stain     Final    Value: RARE WBC PRESENT,BOTH PMN AND MONONUCLEAR     NO SQUAMOUS EPITHELIAL CELLS SEEN     ABUNDANT GRAM NEGATIVE RODS     RARE YEAST   Culture MODERATE PSEUDOMONAS AERUGINOSA   Final    Report Status 09/05/2012 FINAL   Final    Organism ID, Bacteria PSEUDOMONAS AERUGINOSA   Final      Studies: No results found.  Scheduled Meds:    . antiseptic oral rinse  15 mL Mouth Rinse BID  . ciprofloxacin  500 mg Oral BID  . digoxin  125 mcg Oral QPM  . diltiazem  240 mg Oral Daily  . ezetimibe  10 mg Oral QPM  . ferrous sulfate  325 mg Oral Q breakfast  . guaiFENesin  600 mg Oral BID  . hydrocortisone cream   Topical BID  . methylPREDNISolone (SOLU-MEDROL) injection  125 mg Intravenous Q12H  . mometasone-formoterol  2 puff Inhalation BID  . montelukast  10 mg Oral QHS  . pantoprazole  40 mg Oral Daily  . predniSONE  50 mg Oral Q breakfast  . senna  1 tablet Oral QHS  . sertraline  50 mg Oral Daily  . sodium chloride  3 mL Intravenous Q12H  . sodium chloride  3 mL Intravenous Q12H  . sotalol  80 mg Oral BID  . tiotropium  18 mcg Inhalation Daily  . vitamin C  500 mg Oral Daily  . DISCONTD:  albuterol  2.5 mg Nebulization Q4H  . DISCONTD: ceFEPime (MAXIPIME) IV  1 g Intravenous Q12H  . DISCONTD: levofloxacin (LEVAQUIN) IV  750 mg Intravenous Q24H  . DISCONTD: predniSONE  20 mg Oral Q breakfast  . DISCONTD: vancomycin  500 mg Intravenous Q12H   Continuous Infusions:    Marinda Elk  [facility] Pager [phone].  If 8PM-8AM, please contact night-coverage at [email or web address], password [facility] 09/06/2012, 8:48 AM  LOS: 5 days

## 2012-09-06 NOTE — Progress Notes (Signed)
Patient continued to have hemoptysis. She reported having to spit out just blood without phlegm in small amount but threw it away that I did not see it. Informed patient to notify nurse if more bloody sputum noted. Will continue to monitor patient.

## 2012-09-07 MED ORDER — CIPROFLOXACIN HCL 500 MG PO TABS
500.0000 mg | ORAL_TABLET | Freq: Two times a day (BID) | ORAL | Status: DC
  Administered 2012-09-07 – 2012-09-08 (×3): 500 mg via ORAL
  Filled 2012-09-07 (×5): qty 1

## 2012-09-07 MED ORDER — ALBUTEROL SULFATE (5 MG/ML) 0.5% IN NEBU
2.5000 mg | INHALATION_SOLUTION | Freq: Four times a day (QID) | RESPIRATORY_TRACT | Status: DC
  Administered 2012-09-07 – 2012-09-08 (×4): 2.5 mg via RESPIRATORY_TRACT
  Filled 2012-09-07 (×4): qty 0.5

## 2012-09-07 MED ORDER — BENZOCAINE-MENTHOL 20-0.5 % EX AERO
1.0000 "application " | INHALATION_SPRAY | Freq: Three times a day (TID) | CUTANEOUS | Status: DC | PRN
  Administered 2012-09-07: 1 via TOPICAL
  Filled 2012-09-07: qty 56

## 2012-09-07 MED ORDER — FUROSEMIDE 20 MG PO TABS
20.0000 mg | ORAL_TABLET | Freq: Two times a day (BID) | ORAL | Status: DC
  Filled 2012-09-07 (×3): qty 1

## 2012-09-07 MED ORDER — FUROSEMIDE 20 MG PO TABS
20.0000 mg | ORAL_TABLET | Freq: Two times a day (BID) | ORAL | Status: DC
Start: 2012-09-07 — End: 2012-09-07
  Filled 2012-09-07 (×3): qty 1

## 2012-09-07 MED ORDER — POLYETHYLENE GLYCOL 3350 17 G PO PACK
17.0000 g | PACK | Freq: Every day | ORAL | Status: DC
  Administered 2012-09-07: 17 g via ORAL
  Filled 2012-09-07 (×2): qty 1

## 2012-09-07 MED ORDER — POTASSIUM CHLORIDE CRYS ER 20 MEQ PO TBCR
20.0000 meq | EXTENDED_RELEASE_TABLET | Freq: Two times a day (BID) | ORAL | Status: DC
  Administered 2012-09-07 – 2012-09-08 (×3): 20 meq via ORAL
  Filled 2012-09-07 (×4): qty 1

## 2012-09-07 MED ORDER — PREDNISONE 10 MG PO TABS: ORAL_TABLET | ORAL | Status: DC

## 2012-09-07 MED ORDER — METHYLPREDNISOLONE 8 MG PO TABS: 6.0000 mg | ORAL_TABLET | Freq: Every day | ORAL | Status: DC

## 2012-09-07 MED ORDER — DEXTROSE 5 % IV SOLN
2.0000 g | INTRAVENOUS | Status: AC
  Administered 2012-09-07 – 2012-09-08 (×2): 2 g via INTRAVENOUS
  Filled 2012-09-07 (×2): qty 2

## 2012-09-07 MED ORDER — CIPROFLOXACIN HCL 500 MG PO TABS: 500.0000 mg | ORAL_TABLET | Freq: Two times a day (BID) | ORAL | Status: DC

## 2012-09-07 MED ORDER — PREDNISONE 50 MG PO TABS
50.0000 mg | ORAL_TABLET | Freq: Once | ORAL | Status: AC
  Administered 2012-09-07: 50 mg via ORAL
  Filled 2012-09-07: qty 1

## 2012-09-07 MED ORDER — SENNA 8.6 MG PO TABS
1.0000 | ORAL_TABLET | Freq: Every evening | ORAL | Status: DC
  Administered 2012-09-07: 8.6 mg via ORAL
  Filled 2012-09-07: qty 1

## 2012-09-07 MED ORDER — FUROSEMIDE 20 MG PO TABS
20.0000 mg | ORAL_TABLET | Freq: Every day | ORAL | Status: DC
  Administered 2012-09-07 – 2012-09-08 (×2): 20 mg via ORAL
  Filled 2012-09-07 (×2): qty 1

## 2012-09-07 NOTE — Progress Notes (Signed)
Room 1425 - Trish Fountain - [facility] RN Visit-R.Noriah Osgood RN  Related admission to [facility] diagnosis of COPD.  Pt is FULL code.  Pt alert & oriented, sitting up on BSC.Marland Kitchen No family present.  Staff RN present administering meds, assisting patient.  Pt bright, sitting up in bed ready for breakfast.  States she had BM this am, had issues with nebulizer treatments and has been put on Xopenex, feels better but admits she will most likely need to return to neb treatments when she goes home.  Demonstrated a very congested cough during visit.  Denies any pain or other discomfort at time of visit. Pt states discharge has not been discussed this weekend.   Please call [facility] @ [phone]- ask for RN Liaison or after hours,ask for on-call RN with any hospice needs.    Thank you.  Joneen Boers, RN  [facility]  Hospice Liaison

## 2012-09-07 NOTE — Discharge Summary (Addendum)
Physician Discharge Summary  ANJELIQUE LUNDY OZH:[id number] DOB: 09-11-1924 DOA: 09/01/2012  PCP: [name], MD  Admit date: 09/01/2012 Discharge date: 09/08/2012  Time spent: 40 minutes  Recommendations for Outpatient Follow-up:  1. Dr. Maple Hudson in 2 weeks  Discharge Diagnoses:  Principal Problem:  *Pseudomonas pneumonia Active Problems:  CHRONIC OBSTRUCTIVE ASTHMA UNSPECIFIED  Pacemaker  Chronic anticoagulation  COPD with exacerbation  Chronic use of steroids  Acute respiratory failure   Discharge Condition: stable  Diet recommendation: regular  Filed Weights   09/05/12 0500 09/07/12 0514 09/08/12 0700  Weight: 58.3 kg (128 lb 8.5 oz) 59.693 kg (131 lb 9.6 oz) 60.3 kg (132 lb 15 oz)    History of present illness:  78 y.o. female with prior hx of tobacco use, COPD on home Oxygen, under hospice care with full code status, Hx of afib on chronic anticoagulation with INR 1.89 today, s/p pacer placement on pacerone and dig, Hx of AVR (pig), HTN, chronic steroid use being tapered to 6mg /day, presents to the ER with SOB, yellow sputum productive coughs and hemoptysis, but no chest pain or pleuritic pain for the past 5 days. She has been feeling weak as well. Evaluation in the ER included a CXR which showed a PNA, no leukocytosis with WBC 10.9K, and normal renal fx tests. Her Na is a little low at 133. She is on lasix. Hospitalist was asked to admit her for PNA.   Hospital Course:  Acute respiratory failure (09/02/2012)  -she feels bettertoday, improved air movement Physical exam.  -cont steroids will need long steroid tapered. -continue O2.   PNA (pneumonia) (09/01/2012) -10.29.2013 cefepime,vancomycin and levaquin, deescalated to cipro once sensitivities back 11.1.2013. Add back cefepime as patient pulmonary status is worsening. patient respiration improved 11.3.2013 with increase in steroids.  -10.28.2013 sputum cultures P. Aeruginosa sensitive to cipro and blood cultures  negative.  -Ct chest: Progressive, consolidation predominately involving the right upper lobe, paratracheal lymph node is pathologically enlarged, 4. Small bilateral pleural effusions no empyema or necrotizing PNA.   COPD with exacerbation (09/15/2011) -Continue O 2L. Initially treated with IV steroids. -cont steroids for a prolong course -continue inhalers.   Pacemaker (03/12/2011) -paced rhythm.   Chronic anticoagulation (08/22/2011) -INR therapeutic. Hold coumadin as she continues to have hemoptysis. She is on 2 antibiotics. Monitor INR. -resumed as outpatient as hemoptysis decrease significantly.   Procedures:  none (i.e. Studies not automatically included, echos, thoracentesis, etc; not x-rays)  Consultations:  none  Discharge Exam: Filed Vitals:   09/07/12 2221 09/07/12 2300 09/08/12 0236 09/08/12 0700  BP: 128/61   138/71  Pulse: 61   62  Temp: 97.3 F (36.3 C)   97.7 F (36.5 C)  TempSrc: Oral   Oral  Resp: 16 16  16   Height:      Weight:    60.3 kg (132 lb 15 oz)  SpO2: 95%  95% 92%    General: A&Ox3 Cardiovascular: RRR Respiratory: good ai movement crackle at at bases.  Discharge Instructions      Discharge Orders    Future Appointments: Provider: Department: Dept Phone: Center:   09/12/2012 2:00 PM Vesta Mixer, MD [facility] Main Office [place]) [phone] [facility]   09/26/2012 2:00 PM Waymon Budge, MD [facility] [phone] None   10/20/2012 9:05 AM [facility] Main Office [place]) [phone] [facility]     Future Orders Please Complete By Expires   Diet - low sodium heart healthy      Increase  activity slowly          Medication List     As of 09/08/2012  7:59 AM    STOP taking these medications         amoxicillin-clavulanate 500-125 MG per tablet   Commonly known as: AUGMENTIN      levalbuterol 1.25 MG/3ML nebulizer solution   Commonly known as: XOPENEX      TAKE  these medications         ALPRAZolam 0.25 MG tablet   Commonly known as: XANAX   Take 0.25 mg by mouth at bedtime as needed. For anxiety      CALCIUM CITRATE + D PO   Take 1 tablet by mouth daily.      ciprofloxacin 500 MG tablet   Commonly known as: CIPRO   Take 1 tablet (500 mg total) by mouth 2 (two) times daily.      digoxin 0.125 MG tablet   Commonly known as: LANOXIN   Take 1 tablet (125 mcg total) by mouth every evening.      diltiazem 240 MG 24 hr capsule   Commonly known as: CARDIZEM CD   Take 240 mg by mouth daily.      EPINEPHrine 0.15 MG/0.3ML injection   Commonly known as: EPIPEN JR   Inject 0.3 mLs (0.15 mg total) into the muscle as needed for anaphylaxis.      esomeprazole 40 MG capsule   Commonly known as: NEXIUM   Take 40 mg by mouth daily before breakfast.      ezetimibe 10 MG tablet   Commonly known as: ZETIA   Take 10 mg by mouth every evening.      ferrous sulfate 325 (65 FE) MG tablet   Take 325 mg by mouth daily with breakfast.      Fish Oil 1200 MG Caps   Take 1 capsule by mouth daily.      furosemide 40 MG tablet   Commonly known as: LASIX   Take 20 mg by mouth 2 (two) times daily.      Guaifenesin 1200 MG Tb12   Take 600 mg by mouth 2 (two) times daily at 10 AM and 5 PM.      levalbuterol 45 MCG/ACT inhaler   Commonly known as: XOPENEX HFA   Inhale 2 puffs into the lungs every 4 (four) hours as needed for wheezing or shortness of breath. For shortness of breath      magic mouthwash Soln   Take 5 mLs by mouth 4 (four) times daily as needed. For mouth irritations      methylPREDNISolone 8 MG tablet   Commonly known as: MEDROL   Take 1 tablet (8 mg total) by mouth daily. Alternate 6 mg and 8 mg every day. Take 6 mg one day and 8 mg the next day   Start taking on: 09/22/2012      mometasone 0.1 % cream   Commonly known as: ELOCON   Apply 1 application topically as needed. For dry skin      mometasone-formoterol 200-5 MCG/ACT Aero    Commonly known as: DULERA   Inhale 2 puffs into the lungs 2 (two) times daily as needed.      montelukast 10 MG tablet   Commonly known as: SINGULAIR   TAKE 1 TABLET BY MOUTH EVERY DAY      potassium chloride SA 20 MEQ tablet   Commonly known as: K-DUR,KLOR-CON   Take 1 tablet (20 mEq total) by mouth 2 (  two) times daily.      predniSONE 10 MG tablet   Commonly known as: DELTASONE   Takes 6 tablets for 1 days, then 5 tablets for 1 days, then 4 tablets for 1 days, then 3 tablets for 1 days, then 2 tabs for 1 days, then 1 tab for 1 days, and then stop.      senna 8.6 MG tablet   Commonly known as: SENOKOT   Take 1 tablet by mouth every evening.      sertraline 50 MG tablet   Commonly known as: ZOLOFT   Take 50 mg by mouth daily.      sotalol 80 MG tablet   Commonly known as: BETAPACE   Take 80 mg by mouth 2 (two) times daily.      tiotropium 18 MCG inhalation capsule   Commonly known as: SPIRIVA   Place 18 mcg into inhaler and inhale daily.      vitamin C 500 MG tablet   Commonly known as: ASCORBIC ACID   Take 500 mg by mouth daily.      warfarin 1 MG tablet   Commonly known as: COUMADIN   Take 1 mg by mouth as directed. Take 1 mg on Sunday, Monday, Wednesday, Thursdays and Saturday. Take 0.5 mg on Tuesdays and Fridays.        Follow-up Information    Follow up with [name], MD. In 2 weeks.   Contact information:   [address] [place] [address] [phone]       Follow up with Waymon Budge, MD. In 2 weeks.   Contact information:   [address] [facility] [place] [address] [phone]           The results of significant diagnostics from this hospitalization (including imaging, microbiology, ancillary and laboratory) are listed below for reference.    Significant Diagnostic Studies: Dg Chest 2 View  09/01/2012  *RADIOLOGY REPORT*  Clinical Data: Weakness and shortness of  breath.  Pneumonia.  CHEST - 2 VIEW  Comparison: One-view chest 09/01/2012.  Findings: Lateral right upper lobe pneumonia is again noted. Additional interstitial and airspace disease is present about the right hilum.  Emphysematous changes are stable.  The heart is mildly enlarged without significant edema or effusions to suggest failure.  The pacing wires stable.  IMPRESSION:  1.  Right lateral upper lobe pneumonia. 2.  Asymmetric right perihilar interstitial and airspace disease. 3.  Recommend follow-up chest radiograph to assure resolution of this airspace disease after appropriate treatment.   Original Report Authenticated By: Jamesetta Orleans. MATTERN, M.D.    Ct Chest W Contrast  09/06/2012  *RADIOLOGY REPORT*  Clinical Data: Cough and hemoptysis  CT CHEST WITH CONTRAST  Technique:  Multidetector CT imaging of the chest was performed following the standard protocol during bolus administration of intravenous contrast.  Contrast: 80mL OMNIPAQUE IOHEXOL 300 MG/ML  SOLN  Comparison: 12/13/2011  Findings:  Lungs/pleura: There are small bilateral pleural effusions. Advanced changes of centrilobular and para septal emphysema. Progressive airspace consolidation within the periphery of the right upper lobe is identified.  Areas of ground-glass attenuation are noted within the right middle lobe.  The subpleural/compressive type atelectasis and consolidation is noted within both lung bases. Multiple scattered pulmonary nodules are identified in the left lung.  Nodule within the left upper lobe measures 5.7 mm and is new from previous study, image 11.  Also in the left upper lobe is  a 4.7 mm nodule, image 27.  In the left lower lobe there is a peripheral nodule measuring 5.7 mm, image 43, which is new from the previous exam.  Heart/Mediastinum: Right paratracheal lymph node measures 9.2 mm, image 18.  Previously this measured 5.5 mm.  Low right paratracheal lymph node measures 1.3 cm, image 25.  This is compared with 0.8  cm previously.  AP window lymph node measures 0.8 cm, image 20.  This is compared with 0.5 cm previously.  No pericardial effusion. There is calcified atherosclerotic disease affecting the LAD, left circumflex and RCA coronary arteries.  The patient is status post median sternotomy and aortic valve repair.  Upper abdomen: Limited imaging through the upper abdomen shows a cyst within the medial segment of the left hepatic lobe.  No mass or adenopathy noted.  Bones/Musculoskeletal:  No axillary or supraclavicular adenopathy identified.  There is mild multilevel thoracic spondylosis.  No aggressive lytic or sclerotic bone lesions. 1.  IMPRESSION: 1.  Progressive, chronic airspace consolidation predominately involving the right upper lobe. Differential includes pulmonary neoplasm versus chronic and recurrent infection. 2.  Interval development of several small pulmonary nodules in the left lung. 3.  Increase in size of mediastinal lymph nodes.  The low right paratracheal lymph node is pathologically enlarged.  4.  Small bilateral pleural effusions with overlying compressive type atelectasis and consolidation.   Original Report Authenticated By: Signa Kell, M.D.    Dg Chest Port 1 View  09/01/2012  *RADIOLOGY REPORT*  Clinical Data: Short of breath  PORTABLE CHEST - 1 VIEW  Comparison: 05/27/2012  Findings: COPD is present.  Right upper lobe pleural and parenchymal density appears more prominent.  Some of this may be due to projection and technique however I would suggest a follow-up two-view chest x-ray and if this area has progressed then a CT of the chest with contrast would be recommended.  Cardiac enlargement.  Negative for heart failure or effusion. Pacemaker is unchanged.  IMPRESSION: Right pleural and parenchymal density appears more prominent.  This may represent pneumonia or a tumor.  Follow-up two-view chest x-ray suggested.   Original Report Authenticated By: Camelia Phenes, M.D.      Microbiology: Recent Results (from the past 240 hour(s))  CULTURE, EXPECTORATED SPUTUM-ASSESSMENT     Status: Normal   Collection Time   09/02/12  1:23 PM      Component Value Range Status Comment   Specimen Description SPUTUM   Final    Special Requests NONE   Final    Sputum evaluation     Final    Value: THIS SPECIMEN IS ACCEPTABLE. RESPIRATORY CULTURE REPORT TO FOLLOW.   Report Status 09/02/2012 FINAL   Final   CULTURE, RESPIRATORY     Status: Normal   Collection Time   09/02/12  1:23 PM      Component Value Range Status Comment   Specimen Description SPUTUM   Final    Special Requests NONE   Final    Gram Stain     Final    Value: RARE WBC PRESENT,BOTH PMN AND MONONUCLEAR     NO SQUAMOUS EPITHELIAL CELLS SEEN     ABUNDANT GRAM NEGATIVE RODS     RARE YEAST   Culture MODERATE PSEUDOMONAS AERUGINOSA   Final    Report Status 09/05/2012 FINAL   Final    Organism ID, Bacteria PSEUDOMONAS AERUGINOSA   Final      Labs: Basic Metabolic Panel:  Lab 09/06/12 1610 09/05/12 0430 09/03/12  0139 09/01/12 1835  NA 138 137 132* 133*  K 3.5 3.6 3.5 4.0  CL 98 98 95* 94*  CO2 34* 32 32 31  GLUCOSE 88 93 132* 109*  BUN 11 11 15 12   CREATININE 0.56 0.49* 0.64 0.52  CALCIUM 9.0 9.2 8.9 9.1  MG -- -- 2.0 --  PHOS -- -- -- --   Liver Function Tests:  Lab 09/01/12 1835  AST 27  ALT 21  ALKPHOS 48  BILITOT 0.3  PROT 6.9  ALBUMIN 2.9*   No results found for this basename: LIPASE:5,AMYLASE:5 in the last 168 hours No results found for this basename: AMMONIA:5 in the last 168 hours CBC:  Lab 09/05/12 0430 09/03/12 0139 09/01/12 1835  WBC 10.5 16.4* 10.9*  NEUTROABS -- -- --  HGB 11.2* 10.9* 11.3*  HCT 35.3* 33.6* 34.8*  MCV 83.6 83.2 82.3  PLT 253 239 241   Cardiac Enzymes: No results found for this basename: CKTOTAL:5,CKMB:5,CKMBINDEX:5,TROPONINI:5 in the last 168 hours BNP: BNP (last 3 results)  Basename 09/01/12 1905 12/10/11 1833 09/18/11 0630  PROBNP 796.0*  501.6* 944.2*   CBG: No results found for this basename: GLUCAP:5 in the last 168 hours     Signed:  Marinda Elk  [facility] 09/08/2012, 7:59 AM

## 2012-09-07 NOTE — Progress Notes (Signed)
[facility] PROGRESS NOTE Interim History: [name] is an 78 y.o. female with prior hx of tobacco use, PSeudomonas PNA, COPD on home Oxygen, under hospice care with full code status, Hx of afib on chronic anticoagulation with INR 1.89 on admission, s/p pacer placement on pacerone and dig, Hx of AVR (pig), HTN, chronic steroid use being tapered to 6mg /day, presented to the ER with SOB, yellow sputum productive coughs and hemoptysis, but no chest pain or pleuritic pain for 5 days prior to admission. Started on cefepime levaquin and vanc. Cultures showed P. Aeruginosa sensitive to cipro. After deescalating antibiotcs patient respirations worsen. Cipro was continued and cefepime added.   Assessment/Plan: Acute respiratory failure (09/02/2012) -she feels bettertoday, improved air movement Physical exam. -cont steroids will need long steroid tapered.. -continue O2.  PNA (pneumonia) (09/01/2012) -10.29.2013 cefepime,vancomycin and levaquin, deescalated to cipro once sensitivities back 11.1.2013. Add back cefepime as patient pulmonary status is worsening. patient respiration improved today with increase in steroids. -10.28.2013 sputum cultures P.  Aeruginosa sensitive to cipro and blood cultures negative. -Ct chest:  Progressive,  consolidation predominately involving the right upper lobe, paratracheal lymph node is pathologically enlarged, 4. Small bilateral pleural effusions no empyema or necrotizing PNA.  COPD with exacerbation (09/15/2011) -crackles. Continue  O 2L. -contsteroids. -continue inhalers.   Pacemaker (03/12/2011) -paced rhythm.  Chronic anticoagulation (08/22/2011) -INR therapeutic. Hold coumadin as she continues to have hemoptysis. She is on 2 antibiotics.  Monitor INR.  Code Status: Full Family Communication: None at bedside  Disposition Plan: She has hospice services at home. Anticipate discharge home when better. PT for  home.   Consultants:  none  Procedures:  none  Antibiotics: Vanc, cefepime and levaquin 09/01/2012  HPI/Subjective: Still SOB not as bad yesterday. She continues to have hemoptysis and bright red blood, less than yesterday   Objective: Filed Vitals:   09/06/12 2144 09/06/12 2350 09/07/12 0459 09/07/12 0514  BP: 143/58   136/61  Pulse: 63   68  Temp: 97.7 F (36.5 C)   97.6 F (36.4 C)  TempSrc: Oral   Oral  Resp: 18   18  Height:      Weight:    59.693 kg (131 lb 9.6 oz)  SpO2: 96% 93% 94% 95%    Intake/Output Summary (Last 24 hours) at 09/07/12 0916 Last data filed at 09/07/12 0826  Gross per 24 hour  Intake   1080 ml  Output   2000 ml  Net   -920 ml   Filed Weights   09/04/12 0536 09/05/12 0500 09/07/12 0514  Weight: 57.6 kg (126 lb 15.8 oz) 58.3 kg (128 lb 8.5 oz) 59.693 kg (131 lb 9.6 oz)    Exam:  General: Alert, awake, oriented x3, in no acute distress.  HEENT: No bruits, no goiter.  Heart: Regular rate and rhythm, without murmurs, rubs, gallops.  Lungs: moderate air movement , crackles B/L Abdomen: Soft, nontender, nondistended, positive bowel sounds.  Neuro: Grossly intact, nonfocal.   Data Reviewed: Basic Metabolic Panel:  Lab 09/06/12 1610 09/05/12 0430 09/03/12 0139 09/01/12 1835  NA 138 137 132* 133*  K 3.5 3.6 3.5 4.0  CL 98 98 95* 94*  CO2 34* 32 32 31  GLUCOSE 88 93 132* 109*  BUN 11 11 15 12   CREATININE 0.56 0.49* 0.64 0.52  CALCIUM 9.0 9.2 8.9 9.1  MG -- -- 2.0 --  PHOS -- -- -- --   Liver Function Tests:  Lab 09/01/12 1835  AST 27  ALT 21  ALKPHOS 48  BILITOT 0.3  PROT 6.9  ALBUMIN 2.9*   No results found for this basename: LIPASE:5,AMYLASE:5 in the last 168 hours No results found for this basename: AMMONIA:5 in the last 168 hours CBC:  Lab 09/05/12 0430 09/03/12 0139 09/01/12 1835  WBC 10.5 16.4* 10.9*  NEUTROABS -- -- --  HGB 11.2* 10.9* 11.3*  HCT 35.3* 33.6* 34.8*  MCV 83.6 83.2 82.3  PLT 253 239 241    Cardiac Enzymes: No results found for this basename: CKTOTAL:5,CKMB:5,CKMBINDEX:5,TROPONINI:5 in the last 168 hours BNP (last 3 results)  Basename 09/01/12 1905 12/10/11 1833 09/18/11 0630  PROBNP 796.0* 501.6* 944.2*   CBG: No results found for this basename: GLUCAP:5 in the last 168 hours  Recent Results (from the past 240 hour(s))  CULTURE, EXPECTORATED SPUTUM-ASSESSMENT     Status: Normal   Collection Time   09/02/12  1:23 PM      Component Value Range Status Comment   Specimen Description SPUTUM   Final    Special Requests NONE   Final    Sputum evaluation     Final    Value: THIS SPECIMEN IS ACCEPTABLE. RESPIRATORY CULTURE REPORT TO FOLLOW.   Report Status 09/02/2012 FINAL   Final   CULTURE, RESPIRATORY     Status: Normal   Collection Time   09/02/12  1:23 PM      Component Value Range Status Comment   Specimen Description SPUTUM   Final    Special Requests NONE   Final    Gram Stain     Final    Value: RARE WBC PRESENT,BOTH PMN AND MONONUCLEAR     NO SQUAMOUS EPITHELIAL CELLS SEEN     ABUNDANT GRAM NEGATIVE RODS     RARE YEAST   Culture MODERATE PSEUDOMONAS AERUGINOSA   Final    Report Status 09/05/2012 FINAL   Final    Organism ID, Bacteria PSEUDOMONAS AERUGINOSA   Final      Studies: Ct Chest W Contrast  09/06/2012  *RADIOLOGY REPORT*  Clinical Data: Cough and hemoptysis  CT CHEST WITH CONTRAST  Technique:  Multidetector CT imaging of the chest was performed following the standard protocol during bolus administration of intravenous contrast.  Contrast: 80mL OMNIPAQUE IOHEXOL 300 MG/ML  SOLN  Comparison: 12/13/2011  Findings:  Lungs/pleura: There are small bilateral pleural effusions. Advanced changes of centrilobular and para septal emphysema. Progressive airspace consolidation within the periphery of the right upper lobe is identified.  Areas of ground-glass attenuation are noted within the right middle lobe.  The subpleural/compressive type atelectasis and  consolidation is noted within both lung bases. Multiple scattered pulmonary nodules are identified in the left lung.  Nodule within the left upper lobe measures 5.7 mm and is new from previous study, image 11.  Also in the left upper lobe is a 4.7 mm nodule, image 27.  In the left lower lobe there is a peripheral nodule measuring 5.7 mm, image 43, which is new from the previous exam.  Heart/Mediastinum: Right paratracheal lymph node measures 9.2 mm, image 18.  Previously this measured 5.5 mm.  Low right paratracheal lymph node measures 1.3 cm, image 25.  This is compared with 0.8 cm previously.  AP window lymph node measures 0.8 cm, image 20.  This is compared with 0.5 cm previously.  No pericardial effusion. There is calcified atherosclerotic disease affecting the LAD, left circumflex and RCA coronary arteries.  The patient is status post median sternotomy and aortic valve repair.  Upper abdomen: Limited imaging through the upper abdomen shows a cyst within the medial segment of the left hepatic lobe.  No mass or adenopathy noted.  Bones/Musculoskeletal:  No axillary or supraclavicular adenopathy identified.  There is mild multilevel thoracic spondylosis.  No aggressive lytic or sclerotic bone lesions. 1.  IMPRESSION: 1.  Progressive, chronic airspace consolidation predominately involving the right upper lobe. Differential includes pulmonary neoplasm versus chronic and recurrent infection. 2.  Interval development of several small pulmonary nodules in the left lung. 3.  Increase in size of mediastinal lymph nodes.  The low right paratracheal lymph node is pathologically enlarged.  4.  Small bilateral pleural effusions with overlying compressive type atelectasis and consolidation.   Original Report Authenticated By: Signa Kell, M.D.     Scheduled Meds:    . antiseptic oral rinse  15 mL Mouth Rinse BID  . [COMPLETED] ceFEPime (MAXIPIME) IV  2 g Intravenous Once  . ceFEPime (MAXIPIME) IV  2 g Intravenous  Q24H  . digoxin  125 mcg Oral QPM  . diltiazem  240 mg Oral Daily  . ezetimibe  10 mg Oral QPM  . ferrous sulfate  325 mg Oral Q breakfast  . guaiFENesin  600 mg Oral BID  . hydrocortisone cream   Topical BID  . [COMPLETED] methylPREDNISolone (SOLU-MEDROL) injection  125 mg Intravenous Q12H  . mometasone-formoterol  2 puff Inhalation BID  . montelukast  10 mg Oral QHS  . pantoprazole  40 mg Oral Daily  . predniSONE  50 mg Oral Q breakfast  . senna  1 tablet Oral QHS  . sertraline  50 mg Oral Daily  . sodium chloride  3 mL Intravenous Q12H  . sodium chloride  3 mL Intravenous Q12H  . sotalol  80 mg Oral BID  . tiotropium  18 mcg Inhalation Daily  . vitamin C  500 mg Oral Daily   Continuous Infusions:    Marinda Elk  [facility] Pager [phone].  If 8PM-8AM, please contact night-coverage at [email or web address], password [facility] 09/07/2012, 9:16 AM  LOS: 6 days

## 2012-09-07 NOTE — Progress Notes (Signed)
09-07-12  NSG:  Pt has not had a BM in 3 days.  Is receiving sennakot qhs,  Is passing flatus and tolerating diet but pt is concerned.  May she have an order for PRN Laxative?

## 2012-09-07 NOTE — Progress Notes (Signed)
09-07-12  NSG: Pt has had 3 small dark bloody sputums this shift.  Pt reports the amountt is much less than this morning, she reports breathing better, wheezes have decreased, sats are 96% on 4l/m [place]

## 2012-09-08 LAB — LIPID PANEL
LDL Cholesterol: 128 mg/dL — ABNORMAL HIGH (ref 0–99)
VLDL: 19 mg/dL (ref 0–40)

## 2012-09-08 MED ORDER — CIPROFLOXACIN HCL 500 MG PO TABS: 500.0000 mg | ORAL_TABLET | Freq: Two times a day (BID) | ORAL | Status: AC

## 2012-09-08 NOTE — Progress Notes (Signed)
Physical Therapy Treatment Patient Details Name: [name] MRN: [id number] DOB: 05-Dec-1923 Today's Date: 09/08/2012 Time: 1100-1119 PT Time Calculation (min): 19 min  PT Assessment / Plan / Recommendation Comments on Treatment Session  Pt with increased fatigue today, however did agree to ambulate in hallway.  Had to take one rest break while ambulating.  Pt to D/C today.     Follow Up Recommendations  Home health PT;Supervision/Assistance - 24 hour     Does the patient have the potential to tolerate intense rehabilitation     Barriers to Discharge        Equipment Recommendations  None recommended by PT    Recommendations for Other Services    Frequency Min 3X/week   Plan Discharge plan remains appropriate    Precautions / Restrictions Precautions Precautions: Fall Precaution Comments: on O2 Restrictions Weight Bearing Restrictions: No   Pertinent Vitals/Pain No pain, just very fatigued.     Mobility  Bed Mobility Bed Mobility: Supine to Sit Supine to Sit: HOB elevated;4: Min assist Details for Bed Mobility Assistance: Required hand held assist from caregiver to get sitting on EOB.   Transfers Transfers: Sit to Stand;Stand to Sit Sit to Stand: 4: Min guard;From elevated surface;With upper extremity assist;From bed Stand to Sit: 4: Min guard;With upper extremity assist;With armrests;To chair/3-in-1 Details for Transfer Assistance: Min/guard for safety with mod cues for hand placement and safety when sitting/standing due to pt tendency to pull on RW to stand and not reach back when sitting.  Ambulation/Gait Ambulation/Gait Assistance: 5: Supervision Ambulation Distance (Feet): 20 Feet (then another 20') Assistive device: Rolling walker Ambulation/Gait Assistance Details: Min cues for guiding RW.  Ambulated on 4L O2.  Pt stopped to take single sitting rest break due to fatigue.   Gait Pattern: Within Functional Limits Gait velocity: decreased General Gait  Details: no LOB; distance limited by fatigue; pt on 4L O2 while walking; SaO2 90%    Exercises     PT Diagnosis:    PT Problem List:   PT Treatment Interventions:     PT Goals Acute Rehab PT Goals PT Goal Formulation: With patient Time For Goal Achievement: 09/18/12 Potential to Achieve Goals: Fair Pt will Transfer Bed to Chair/Chair to Bed: with supervision PT Transfer Goal: Bed to Chair/Chair to Bed - Progress: Progressing toward goal Pt will Ambulate: with least restrictive assistive device;with min assist;51 - 150 feet PT Goal: Ambulate - Progress: Met Pt will Go Up / Down Stairs: 6-9 stairs;with min assist PT Goal: Up/Down Stairs - Progress: Discontinued (comment) (states she can use another entrance with no stairs. )  Visit Information  Last PT Received On: 09/08/12 Assistance Needed: +1    Subjective Data  Subjective: I'm really tired, but I'll try.  Patient Stated Goal: do walk more   Cognition  Overall Cognitive Status: Appears within functional limits for tasks assessed/performed Arousal/Alertness: Awake/alert Orientation Level: Appears intact for tasks assessed Behavior During Session: [facility] for tasks performed    Balance     End of Session PT - End of Session Equipment Utilized During Treatment: Oxygen Activity Tolerance: Patient limited by fatigue Patient left: in bed;with call bell/phone within reach;with family/visitor present Nurse Communication: Mobility status   GP     Page, Meribeth Mattes 09/08/2012, 11:37 AM

## 2012-09-08 NOTE — Progress Notes (Signed)
Room 1425 - Trish Fountain-  [facility] RN Visit-R.Ramesh Moan RN  Related admission to [facility] diagnosis of COPD.   Pt is FULL code.    Pt alert & oriented,ambulating with PT at beginning of visit. Per staff RN - DC summary/orders in place.  Pt to transport home via [facility]-discussed with staff RN.  No family present- private paid care giver present.   [facility] RN, SW and [facility] team notified of pending discharge today.    Patient's home medication list is on shadow chart.   Please call [facility] @ [phone]- ask for RN Liaison or after hours,ask for on-call RN with any hospice needs.    Thank you.  Joneen Boers, RN  [facility]  Hospice Liaison

## 2012-09-09 ENCOUNTER — Encounter: Payer: Self-pay | Admitting: Vascular Surgery

## 2012-09-09 ENCOUNTER — Telehealth: Payer: Self-pay | Admitting: *Deleted

## 2012-09-09 ENCOUNTER — Telehealth: Payer: Self-pay | Admitting: Cardiovascular Disease

## 2012-09-09 NOTE — Telephone Encounter (Signed)
Discussed her lasix use and kidney results, hospitalist stopped lasix for awhile when in  for a week for pneumonia. Pt is back on lasix, reassured her that she needs the lasix for her heart condition and we will monitor, last labs- BUN/creat  were normal, daughter agreed with plan and will pass info along to mother.

## 2012-09-09 NOTE — Telephone Encounter (Signed)
New Problem:    Patient's daughter called in wanting you to give her a call back to discuss some information they received when she was in the hospital.

## 2012-09-09 NOTE — Telephone Encounter (Signed)
[name] Nurse from Hospice called stating that pt came home from the Hospital yesterday and she wanted to know when to check his INR .Pt is on Cipro and Prednisone dose pack .INR on discharge per nurse was 1.89 . Instructed to check INR on Thursday Novenmber 7th and call results to coumadin clinic

## 2012-09-11 ENCOUNTER — Ambulatory Visit: Payer: Self-pay | Admitting: Cardiology

## 2012-09-11 DIAGNOSIS — I4891 Unspecified atrial fibrillation: Secondary | ICD-10-CM

## 2012-09-11 LAB — POCT INR: INR: 2.2

## 2012-09-12 ENCOUNTER — Ambulatory Visit: Payer: Medicare Other | Admitting: Cardiovascular Disease

## 2012-09-15 ENCOUNTER — Telehealth: Payer: Self-pay | Admitting: Internal Medicine

## 2012-09-15 NOTE — Telephone Encounter (Signed)
Called, spoke with pt's hospice nurse, [name].  Pt will finish cipro as directed from hospital d/c on tomorrow.  Per [name], pt's lungs sound better, and pt looks better.  States pt's sputum is pale yellow with occas hemoptysis (reports this is not new for pt).  [name] states pt's temp is 98.9 but is normally in the low 97.  Per [name], pt would like to know if she needs an extension on cipro.  Dr. Maple Hudson, pls advise.  Thank you.  [facility]  Last OV with Dr. Maple Hudson 07/29/12  Allergies  Allergen Reactions  . Cephalexin     Unsure as to reaction.  ? Hives with Cephalexin; "I can take Penicillin"  . Atorvastatin     Forgetfulness   . Azithromycin Nausea And Vomiting  . Codeine Nausea And Vomiting  . Crestor (Rosuvastatin Calcium)     Forgetfulness    . Estradiol     Unknown reaction  . Hydrocortisone Hives    Flea bites were mistaken as rash from hydrocortisone  . Lovastatin Other (See Comments)    forgetfullness,  . Moxifloxacin     Unknown reaction  . Sulfonamide Derivatives     itching

## 2012-09-15 NOTE — Telephone Encounter (Signed)
Per [name], no need to extend Cipro.  Patient to call as needed.

## 2012-09-15 NOTE — Telephone Encounter (Signed)
Called, spoke with [name].  Informed her of below per Dr. Kriste Basque.  She verbalized understanding.

## 2012-09-19 ENCOUNTER — Other Ambulatory Visit: Payer: Self-pay | Admitting: Cardiovascular Disease

## 2012-09-19 MED ORDER — DILTIAZEM HCL ER COATED BEADS 240 MG PO CP24: 240.0000 mg | ORAL_CAPSULE | Freq: Every day | ORAL | Status: DC

## 2012-09-23 ENCOUNTER — Telehealth: Payer: Self-pay | Admitting: Internal Medicine

## 2012-09-23 NOTE — Telephone Encounter (Signed)
[name] returned triage's call.  [name]

## 2012-09-23 NOTE — Telephone Encounter (Signed)
[name] called back regarding the methyprednisolone Rx & would like to ensure that a correct dosage will be called in.  Please call & give update when available at [phone].  [name]'

## 2012-09-23 NOTE — Telephone Encounter (Signed)
LMTCB

## 2012-09-23 NOTE — Telephone Encounter (Signed)
She is not on prednisone- make sure that is not on her list. I asked to double her medrol/ methylprednisolone dose to 8 x 2 = 16 mg daily, until she is feeling better. Then she can drop back to alternating 8 mg with 6 mg every other day as she was doing before.

## 2012-09-23 NOTE — Telephone Encounter (Signed)
[email or web address]. Jennifer Castillo, CMA   

## 2012-09-23 NOTE — Telephone Encounter (Signed)
Called spoke with [name], advised of CY's recs as stated below.  [name] asking for clarification > increase prednisone or the methyprednisolone that pt is currently alternating 6mg  and 8mg ?  Per CY: increase methylprednisolone to 15mg  daily.    Informed [name] of this clarification.  [name] requests a new rx be sent to [facility] [address] for methyprednisolone 10mg  1.5tabs by mouth once daily.  Advised pt to keep 11.22.13 ov w/ CY.  [facility]ist Clydie Braun and gave VO for methylprednisolone 10mg  #45 1.5 tabs by mouth once daily with 0 refills.  However, methylprednisolone does not in a 10mg  tab.  Only in 2mg , 4mg , 8mg , 16mg  and 32mg .  Dr Maple Hudson please advise, thanks.

## 2012-09-23 NOTE — Telephone Encounter (Signed)
I spoke with [name] and she stated she received a call from pt this AM. She stated pt stated her oxygen stats are in the low 80's with very minimal activity. She wanted to know if her methylprednisolone needed to be increased. She is currently alternating 6mg  and 8 mg daily.  Per [name] pt health is declining and pt is in denial about this. Pt has an appt scheduled for 09/26/12 at 2:00 with Dr. Maple Hudson. Please advise Dr. Maple Hudson thanks  Allergies  Allergen Reactions  . Cephalexin     Unsure as to reaction.  ? Hives with Cephalexin; "I can take Penicillin"  . Atorvastatin     Forgetfulness   . Azithromycin Nausea And Vomiting  . Codeine Nausea And Vomiting  . Crestor (Rosuvastatin Calcium)     Forgetfulness    . Estradiol     Unknown reaction  . Hydrocortisone Hives    Flea bites were mistaken as rash from hydrocortisone  . Lovastatin Other (See Comments)    forgetfullness,  . Moxifloxacin     Unknown reaction  . Sulfonamide Derivatives     itching

## 2012-09-23 NOTE — Telephone Encounter (Signed)
Per CY-try increasing Prednisone to 10 mg daily.

## 2012-09-24 NOTE — Telephone Encounter (Signed)
Spoke with [name] (hospice nurse) and aware of changes to Rx until feeling better then go back to normal dosing.

## 2012-09-25 ENCOUNTER — Ambulatory Visit (INDEPENDENT_AMBULATORY_CARE_PROVIDER_SITE_OTHER): Payer: Medicare Other

## 2012-09-25 DIAGNOSIS — J309 Allergic rhinitis, unspecified: Secondary | ICD-10-CM

## 2012-09-26 ENCOUNTER — Ambulatory Visit: Payer: Self-pay | Admitting: Cardiology

## 2012-09-26 ENCOUNTER — Ambulatory Visit (INDEPENDENT_AMBULATORY_CARE_PROVIDER_SITE_OTHER)
Admission: RE | Admit: 2012-09-26 | Discharge: 2012-09-26 | Disposition: A | Discharge status: Home / Self Care | Payer: Medicare Other | Source: Ambulatory Visit | Attending: Internal Medicine | Admitting: Internal Medicine

## 2012-09-26 ENCOUNTER — Encounter: Payer: Self-pay | Admitting: Internal Medicine

## 2012-09-26 ENCOUNTER — Ambulatory Visit (INDEPENDENT_AMBULATORY_CARE_PROVIDER_SITE_OTHER): Payer: Medicare Other | Admitting: Internal Medicine

## 2012-09-26 VITALS — BP 118/62 | HR 85 | Ht 62.0 in | Wt 127.2 lb

## 2012-09-26 DIAGNOSIS — J189 Pneumonia, unspecified organism: Secondary | ICD-10-CM

## 2012-09-26 DIAGNOSIS — J441 Chronic obstructive pulmonary disease with (acute) exacerbation: Secondary | ICD-10-CM

## 2012-09-26 DIAGNOSIS — J40 Bronchitis, not specified as acute or chronic: Secondary | ICD-10-CM

## 2012-09-26 DIAGNOSIS — I4891 Unspecified atrial fibrillation: Secondary | ICD-10-CM

## 2012-09-26 MED ORDER — ALBUTEROL SULFATE (2.5 MG/3ML) 0.083% IN NEBU: 2.5000 mg | INHALATION_SOLUTION | Freq: Four times a day (QID) | RESPIRATORY_TRACT | Status: AC | PRN

## 2012-09-26 MED ORDER — MAGIC MOUTHWASH: ORAL | Status: AC

## 2012-09-26 MED ORDER — FLUTICASONE FUROATE-VILANTEROL 100-25 MCG/INH IN AEPB: 1.0000 | INHALATION_SPRAY | Freq: Every day | RESPIRATORY_TRACT | Status: DC

## 2012-09-26 MED ORDER — METHYLPREDNISOLONE 8 MG PO TABS: ORAL_TABLET | ORAL | Status: DC

## 2012-09-26 NOTE — Patient Instructions (Addendum)
Script sent for medrol 8 mg.   Sample Breo Ellipta    1 puff, then rinse, one time every day.      Try this instead of Dulera. When it is used up, go back to your [facility]

## 2012-09-26 NOTE — Progress Notes (Signed)
Patient ID: [name], female    DOB: 12-07-1923, 78 y.o.   MRN: [id number]  HPI  07/23/2011-86 yoF former smoker followed for COPD, chronic hypoxic respiratory failure, complicated by atrial fibrillation, valvular heart disease and intermittent hemoptysis. She blames rain and pollen this week for nasal congestion. Minor epistaxis despite saline spray. Short of breath with her reading but that's not really different. Coughing a little scant yellow sputum, not bad. This happened about the time she changed to generic Singulair so she's not sure if it's a medication effect but does not feel as if she has cold. Continues oxygen at 3-3.5 L.  09/25/11- 86 yoF former smoker followed for COPD, chronic hypoxic respiratory failure, complicated by atrial fibrillation, valvular heart disease and intermittent hemoptysis Post hospital- 11/10-11/16/12- she was discharged with diagnoses COPD with exacerbation, acute on chronic respiratory failure on chronic oxygen, presumed community-acquired pneumonia, acute diastolic congestive heart failure, atrial fibrillation, history of aortic valve replacement, hypertension, history of coronary artery disease. She had had some blood in her sputum again. Chest x-ray had shown previous CABG and a right chest wall pacemaker, normal heart size, peripheral opacity within the right mid lung which was new with left pleural effusion and left lower lobe airspace consolidation. The concern was for multifocal pneumonia. She still feels especially unsteady if she tries to walk with her walker. There still is some blood streak in her sputum after treatment with broad-spectrum antibiotics in hospital. She expresses strong preference for brand-name Xopenex nebulizer solution rather than generic which she does not consider is effective.   10/25/11- 63 yoF former smoker followed for COPD, chronic hypoxic respiratory failure, complicated by atrial fibrillation, valvular heart disease and  intermittent hemoptysis After last hosp, she spent 4 weeks in rehab SNF. Feels much stronger. Some edema of feet. Last CT had indicated chronic pneumonia/ infiltrates- reviewed with her.  CT chest 09/25/11- IMPRESSION:  1. There are bilateral areas of peripheral and subpleural  consolidation within the right upper lobe, right middle lobe and  posterior medial left lung base. The appearance is nonspecific  favoring inflammatory or infectious etiologies. Underlying  malignancy cannot be excluded and interval follow-up is can be  necessary to ensure complete resolution. Recommend follow-up  imaging and 3-6 weeks after appropriate antibiotic therapy. The  study of choice would be a noncontrast CT of the chest.  2. Advanced emphysema.  Original Report Authenticated By: Rosealee Albee, M.D.   12/06/11- 36 yoF former smoker followed for COPD, chronic hypoxic respiratory failure, complicated by atrial fibrillation, valvular heart disease and intermittent hemoptysis   Daughter here She has not felt particularly in the last several days. We had sent Augmentin January 29 and she has taken about 4 doses. She is blowing her nose a lot, mostly clear mucus. Cough productive of yellow sputum now turning darker. Thinks she might have had some fever this morning with no chills. Has felt weak, somewhat nauseated. Describes a sharp pain intermittently in the left lower anterolateral chest and left upper quadrant of the abdomen, related to deep breath and movement but not to meals.  12/31/11  43 yoF former smoker followed for COPD, chronic hypoxic respiratory failure, complicated by atrial fibrillation/ coumadin, valvular heart disease and intermittent hemoptysis   Daughters here Post hosp visit 12/10/11- 12/17/11- for AECOPD, Rx'd Vanc/ Zosyn and discharged on Augmentin ending 12/19/11. Denies fever now. Cough productive sticky green to yellow sputum with no blood. Denies chest pain. Doing physical therapy At [facility]  Burn. Using VEST 3x/day, Flutter device. These do help clear secretions. Continues Xopenex nebulizer An inhaler with Dulera 200 and Spiriva. She would like to continue allergy shots but is in a nursing facility now for rehabilitation after her pneumonia.  03/24/12- 86 yoF former smoker followed for COPD, chronic hypoxic respiratory failure, complicated by atrial fibrillation/ coumadin, valvular heart disease and intermittent hemoptysis   Daughters here  Pt states increased sob,wheezing, productive cough with increase activity Now working with home hospice. Alprazolam helps her sleep. Prednisone 10 mg daily maintenance helps stabilize her breathing. Medrol 4 mg was not enough. We compared methylprednisolone with prednisone and discussed long-term side effects.  She believes allergy vaccine has made a difference for her even though she is not outside very much and is now quite elderly. I am less impressed but she believes in it strongly and wants to restart. We discussed whether the hospice nurse could give her shots at home. She has brought this issue up several times.  05/27/12- 86 yoF former smoker followed for COPD, chronic hypoxic respiratory failure, complicated by atrial fibrillation/ coumadin, valvular heart disease and intermittent hemoptysis   Daughter here Post hospital followup-05/04/2012 to 05/10/2012: Summary reviewed with her. DC diagnosis community-acquired pneumonia, hemoptysis, atrial fibrillation, diastolic CHF.CAP/ pseudomonas Rxd zosyn, then 5 days of Cipro.  Pt states since coming out of hospital breathing has gotten better .some wheezing,sob.sometimes  has a productive cough .Pt feels allergy injection is helping her. Says she feels the best that she has in a long time. COPD assessment test (CAT) 31/40 CXR- 05/04/12- images reviewed IMPRESSION:  1. Bilateral airspace opacities which may represent multifocal  infection.  2. Suspect left pleural effusion.  Original Report  Authenticated By: Rosealee Albee, M.D.   07/29/12- 71 yoF former smoker followed for COPD, chronic hypoxic respiratory failure, complicated by atrial fibrillation/ coumadin, valvular heart disease and intermittent hemoptysis   Daughter here States when resting breathing is good but when active still gets SOB. Also, has dizzy spells at any given time(unknown cause); Pt states she ia hving blood in her sputum and would like to discuss VEST pressure and how often/peroids of times to use the vest. If still notices occasional bloodstained sputum and she continues Coumadin. This is not new or progressive but is "more than a little, sometimes". She stopped using the Vest device for one week and bleeding slowed but when she resumed the vibration treatments, bleeding picked up again. The device does help her clear mucus from her chest. She is back living at home again with family checking closely. COPD assessment test (CAT) score 30/40 CXR 05/28/12 IMPRESSION:  COPD. Improving bibasilar infiltrates.  Original Report Authenticated By: Camelia Phenes, M.D.    09/26/12- 25 yoF former smoker followed for COPD, chronic hypoxic respiratory failure, complicated by atrial fibrillation/ coumadin, valvular heart disease and intermittent hemoptysis   Daughter and aide are here She feels better since we increased Medrol from 8-16 mg daily. Now on her third day of this dose. She has had her flu shot. Cough always produces a little streak of blood with yellow mucus especially since she has been off of Cipro. She wears oxygen at 4 L and notices saturation dropped to 84% when she first gets into bed. Hospice is provided nebulized albuterol and she occasionally uses her rescue inhaler. She wakes up needing her nebulizer machine early in the morning. Physical activity is becoming very limited. CT chest 09/06/12- reviewed with them IMPRESSION:  1. Progressive, chronic airspace  consolidation predominately  involving the right  upper lobe. Differential includes pulmonary  neoplasm versus chronic and recurrent infection.  2. Interval development of several small pulmonary nodules in the  left lung.  3. Increase in size of mediastinal lymph nodes. The low right  paratracheal lymph node is pathologically enlarged.  4. Small bilateral pleural effusions with overlying compressive  type atelectasis and consolidation.  Original Report Authenticated By: Signa Kell, M.D.   ROS-see HPI Constitutional:   No-   weight loss, night sweats,  fevers, chills,  +fatigue, lassitude. HEENT:   No-  headaches, difficulty swallowing, tooth/dental problems, sore throat, +episodic epistaxis.      No-  sneezing, itching, ear ache,   +nasal congestion, post nasal drip,  CV:  + No acute chest pain,  No-orthopnea, PND, swelling in lower extremities, anasarca, dizziness, palpitations Resp: + shortness of breath with exertion or at rest.              + productive cough,  No non-productive cough,  + coughing up of blood.              No- change in color of mucus.  No- wheezing.   Skin: No-   rash or lesions. GI:  No-   heartburn, indigestion, abdominal pain, nausea, vomiting,  GU:  MS:  No- acute  joint pain or swelling.   Neuro-     notices numbness in fingers-C7-8 distribution Psych:  No- change in mood or affect. + depression or anxiety.  No memory loss.  Objective:   BP 118/62  Pulse 85  Ht 5\' 2"  (1.575 m)  Wt 127 lb 3.2 oz (57.698 kg)  BMI 23.27 kg/m2  SpO2 92% General- Alert, Oriented, Affect-appropriate, Distress- none acute   O2 4 L/M, wheelchair. Talkative.  Skin- rash-none, lesions- none, excoriation- none Lymphadenopathy- none Head- atraumatic            Eyes- Gross vision intact, PERRLA, conjunctivae clear secretions            Ears- Hearing, canals-normal for age            Nose- Clear, no-Septal dev, mucus, polyps, erosion, perforation.              Throat- Mallampati II-III , mucosa clear , drainage- none,  tonsils- atrophic Neck- flexible , trachea midline, no stridor , thyroid nl, carotid no bruit Chest - symmetrical excursion , unlabored           Heart/CV- RRR , +2-3/6 systolic LSB murmur , no gallop  , no rub, nl s1 s2                           - JVD- 1cm , edema- trace, stasis changes- none, varices- none           Lung-  dullness-none, rub- none,  +Bilateral mild rhonchi. Deep rattling cough, + distant wheeze           Chest wall- not tender, Pacemaker. Abd-  Br/ Gen/ Rectal- Not done, not indicated Extrem- cyanosis- none, clubbing, none, atrophy- none, strength- nl for her Neuro- grossly intact to observation

## 2012-09-29 ENCOUNTER — Ambulatory Visit: Payer: Self-pay | Admitting: Cardiovascular Disease

## 2012-09-29 DIAGNOSIS — I4891 Unspecified atrial fibrillation: Secondary | ICD-10-CM

## 2012-09-29 LAB — POCT INR: INR: 2.4

## 2012-10-01 ENCOUNTER — Telehealth: Payer: Self-pay | Admitting: Cardiovascular Disease

## 2012-10-01 MED ORDER — EZETIMIBE 10 MG PO TABS: 10.0000 mg | ORAL_TABLET | Freq: Every evening | ORAL | Status: AC

## 2012-10-01 NOTE — Telephone Encounter (Signed)
New Problem:    Called in needing a refill of the patient's ezetimibe (ZETIA) 10 MG tablet.

## 2012-10-03 ENCOUNTER — Telehealth: Payer: Self-pay | Admitting: Internal Medicine

## 2012-10-03 NOTE — Telephone Encounter (Signed)
   Result Note     CXR- some improvement in the apparent pneumonia changes in upper right lung. Otherwise stable.   Pt aware of results.

## 2012-10-06 ENCOUNTER — Telehealth: Payer: Self-pay | Admitting: Internal Medicine

## 2012-10-06 NOTE — Telephone Encounter (Signed)
Spoke with [name] states she needed clarification on directions of patients Medrol. [name] stated that the patient thought she was doing a pred taper as opposed to alternating two dosages as the directions states.  I have verified according to patients last OV,[name] verbalized understanding and nothing further needed. Medications Ordered This Encounter         Disp  Refills  Start  End    albuterol (PROVENTIL) (2.5 MG/3ML) 0.083% nebulizer solution  150 mL  12  09/26/2012      Take 3 mLs (2.5 mg total) by nebulization every 6 (six) hours as needed for wheezing or shortness of breath. - Nebulization    methylPREDNISolone (MEDROL) 8 MG tablet  60 tablet  1  09/26/2012  09/26/2013    16 mg (2 tabs) daily x 7 days. The try to alternate 8 mg with 16 mg every other day    Alum & Mag Hydroxide-Simeth (MAGIC MOUTHWASH) SOLN  120 mL  0  09/26/2012      For mouth irritationsTake 5 mLs by mouth 4 (four) times daily as needed. For mouth irritations    Fluticasone Furoate-Vilanterol (BREO ELLIPTA) 100-25 MCG/INH AEPB  1 each  0  09/26/2012      Inhale 1 puff into the lungs daily. - Inhalation

## 2012-10-09 ENCOUNTER — Encounter: Payer: Self-pay | Admitting: Cardiovascular Disease

## 2012-10-09 ENCOUNTER — Ambulatory Visit (INDEPENDENT_AMBULATORY_CARE_PROVIDER_SITE_OTHER): Payer: Medicare Other | Admitting: Cardiovascular Disease

## 2012-10-09 VITALS — BP 110/54 | HR 72 | Ht 62.0 in | Wt 129.8 lb

## 2012-10-09 DIAGNOSIS — I359 Nonrheumatic aortic valve disorder, unspecified: Secondary | ICD-10-CM

## 2012-10-09 DIAGNOSIS — I4891 Unspecified atrial fibrillation: Secondary | ICD-10-CM

## 2012-10-09 DIAGNOSIS — I350 Nonrheumatic aortic (valve) stenosis: Secondary | ICD-10-CM

## 2012-10-09 NOTE — Patient Instructions (Addendum)
Your physician wants you to follow-up in: 6 MONTHS.  You will receive a reminder letter in the mail two months in advance. If you don't receive a letter, please call our office to schedule the follow-up appointment.  Your physician recommends that you continue on your current medications as directed. Please refer to the Current Medication list given to you today.  

## 2012-10-09 NOTE — Progress Notes (Signed)
[name] Date of Birth  11-18-1923       [name]    [name] [address] [name], [address]  [name] [name], [name]  [address]   [name], [name]  [id number] [phone]     [phone]   Fax  [phone]    Fax [phone]  Problem List: 1. Severe COPD 2. Status post aortic valve replacement-residual gradient of 30 mmHg 3. History of atrial flutter with chronic anticoagulation 4. Dual-chamber pacemaker 5. Mild coronary artery disease by heart catheterization in 2006  History of Present Illness:  [name] has had problems with COPD / pneumonia x 3.  She has history of aortic stenosis but her main limitation seems to be COPD. The hospice nurse visits her on a regular basis.  She has had a rough time since I last saw her - frequent episodes of pneumonia.  She  Is walking around the house on occasion.  She is still weak due to her frequent episodes of pneumonia.  She has severe COPD.  Current Outpatient Prescriptions on File Prior to Visit  Medication Sig Dispense Refill  . albuterol (PROVENTIL) (2.5 MG/3ML) 0.083% nebulizer solution Take 3 mLs (2.5 mg total) by nebulization every 6 (six) hours as needed for wheezing or shortness of breath.  150 mL  12  . ALPRAZolam (XANAX) 0.25 MG tablet Take 0.25 mg by mouth at bedtime as needed. For anxiety      . Alum & Mag Hydroxide-Simeth (MAGIC MOUTHWASH) SOLN For mouth irritationsTake 5 mLs by mouth 4 (four) times daily as needed. For mouth irritations  120 mL  0  . Calcium Citrate-Vitamin D (CALCIUM CITRATE + D PO) Take 1 tablet by mouth daily.      . digoxin (LANOXIN) 0.125 MG tablet Take 1 tablet (125 mcg total) by mouth every evening.  30 tablet  6  . diltiazem (CARDIZEM CD) 240 MG 24 hr capsule Take 1 capsule (240 mg total) by mouth daily.  30 capsule  11  . EPINEPHrine (EPIPEN JR) 0.15 MG/0.3ML injection Inject 0.3 mLs (0.15 mg total) into the muscle as needed for anaphylaxis.  1 each   prn  . esomeprazole (NEXIUM) 40 MG capsule Take 40 mg by mouth daily before breakfast.      . ezetimibe (ZETIA) 10 MG tablet Take 1 tablet (10 mg total) by mouth every evening.  30 tablet  12  . ferrous sulfate 325 (65 FE) MG tablet Take 325 mg by mouth daily with breakfast.      . Fluticasone Furoate-Vilanterol (BREO ELLIPTA) 100-25 MCG/INH AEPB Inhale 1 puff into the lungs daily.  1 each  0  . furosemide (LASIX) 40 MG tablet Take 20 mg by mouth 2 (two) times daily.      . Guaifenesin 1200 MG TB12 Take 600 mg by mouth 2 (two) times daily.       Marland Kitchen levalbuterol (XOPENEX HFA) 45 MCG/ACT inhaler Inhale 2 puffs into the lungs every 4 (four) hours as needed for wheezing or shortness of breath. For shortness of breath  1 Inhaler  0  . methylPREDNISolone (MEDROL) 8 MG tablet 16 mg (2 tabs) daily x 7 days. The try to alternate 8 mg with 16 mg every other day  60 tablet  1  . mometasone (ELOCON) 0.1 % cream Apply 1 application topically as needed. For dry skin      . Mometasone Furo-Formoterol Fum (DULERA) 200-5 MCG/ACT AERO Inhale 2 puffs into  the lungs 2 (two) times daily as needed.  13 g  6  . montelukast (SINGULAIR) 10 MG tablet TAKE 1 TABLET BY MOUTH EVERY DAY  30 tablet  3  . Omega-3 Fatty Acids (FISH OIL) 1200 MG CAPS Take 1 capsule by mouth daily.      . potassium chloride SA (K-DUR,KLOR-CON) 20 MEQ tablet Take 1 tablet (20 mEq total) by mouth 2 (two) times daily.  60 tablet  11  . pyridoxine (B-6) 200 MG tablet Take 200 mg by mouth daily.      Marland Kitchen senna (SENOKOT) 8.6 MG tablet Take 1 tablet by mouth every evening.      . sertraline (ZOLOFT) 50 MG tablet Take 50 mg by mouth daily.      . sotalol (BETAPACE) 80 MG tablet Take 80 mg by mouth 2 (two) times daily.      Marland Kitchen tiotropium (SPIRIVA) 18 MCG inhalation capsule Place 18 mcg into inhaler and inhale daily.      . vitamin C (ASCORBIC ACID) 500 MG tablet Take 500 mg by mouth daily.      Marland Kitchen warfarin (COUMADIN) 1 MG tablet Take 1 mg by mouth as directed.  Take 1 mg on Sunday, Monday, Wednesday, Thursdays and Saturday. Take 0.5 mg on Tuesdays and Fridays.        Allergies  Allergen Reactions  . Cephalexin     Unsure as to reaction.  ? Hives with Cephalexin; "I can take Penicillin"  . Atorvastatin     Forgetfulness   . Azithromycin Nausea And Vomiting  . Codeine Nausea And Vomiting  . Crestor (Rosuvastatin Calcium)     Forgetfulness    . Estradiol     Unknown reaction  . Hydrocortisone Hives    Flea bites were mistaken as rash from hydrocortisone  . Lovastatin Other (See Comments)    forgetfullness,  . Moxifloxacin     Unknown reaction  . Sulfonamide Derivatives     itching    Past Medical History  Diagnosis Date  . Asthmatic bronchitis   . Allergic rhinitis   . Atrial fibrillation   . History of aortic valve replacement Dr. Tyrone Sage 2006    Has severe residual gradient and will be managed conservatively  . Sleep apnea   . Chronic obstructive asthma   . Osteoporosis   . Pruritic disorder   . Diverticulosis   . Hemorrhoids   . GERD (gastroesophageal reflux disease)   . IBS (irritable bowel syndrome)   . Pacemaker 2010  . Oxygen dependent   . Atrial flutter     on Betapace  . High risk medication use     on Betapace & Coumadin  . Chronic anticoagulation   . COPD (chronic obstructive pulmonary disease)   . Pneumonia   . Anemia   . Arthritis   . Hypertension   . Coronary artery disease   . Pneumonia 09/2011    X 10 Sep 2011- June  2013  . Complication of anesthesia     trouble waking up  . CHF (congestive heart failure)     Past Surgical History  Procedure Date  . Back surgery 1994  . Tonsillectomy   . Appendectomy   . Ganglion cyst excision   . Vocal cord polyps 1975 and 1984  . Aortic valve replacement 2006    Bioprosthetic  . Pacemaker insertion 2010  . Cardiac electrophysiology mapping and ablation   . Coronary artery bypass graft   . Insert / replace / remove  pacemaker   . Eye surgery   .  Cardiac catheterization   . Coronary angioplasty   . Cardiac valve replacement     History  Smoking status  . Former Smoker -- 1.0 packs/day for 50 years  . Types: Cigarettes  . Quit date: 11/05/1993  Smokeless tobacco  . Never Used    History  Alcohol Use No    Family History  Problem Relation Age of Onset  . Kidney failure Father   . Colon cancer Paternal Aunt   . Heart disease Maternal Grandfather   . Heart attack Paternal Grandfather   . Breast cancer Daughter   . Stroke Maternal Grandmother   . Heart disease Mother     Reviw of Systems:  Reviewed in the HPI.  All other systems are negative.  Physical Exam: Blood pressure 110/54, pulse 72, height 5\' 2"  (1.575 m), weight 129 lb 12.8 oz (58.877 kg), SpO2 99.00%. General: Well developed, well nourished, in no acute distress.  Head: Normocephalic, atraumatic, sclera non-icteric, mucus membranes are moist,   Neck: Supple. Carotids are 2 + without bruits. No JVD  Lungs: she has a few bilateral wheezes  Heart: regular rate.  normal  S1 S2. There is a soft systolic murmur.  Abdomen: Soft, non-tender, non-distended with normal bowel sounds. No hepatomegaly. No rebound/guarding. No masses.  Msk:  Strength and tone are normal  Extremities: No clubbing or cyanosis. No edema.  Distal pedal pulses are 2+ and equal bilaterally.  Neuro: Alert and oriented X 3. Moves all extremities spontaneously.  Psych:  Responds to questions appropriately with a normal affect.  ECG:   Assessment / Plan: ulses are 2+ and equal bilaterally.

## 2012-10-09 NOTE — Assessment & Plan Note (Signed)
She has chronic atrial fibrillation. Has a ventricular pacer. We'll continue with anticoagulation for now. She informed me that she occasionally has some episodes of hemoptysis. If the hemoptysis becomes too severe and we will need to consider stopping the Coumadin.

## 2012-10-09 NOTE — Assessment & Plan Note (Signed)
She's doing very well. She's quite limited by her severe COPD. She has occasional episodes of leg edema but I think this is due to COPD. Her aortic valve sounds stable.  We will continue with her same medications. I'll see her again in 6 months.

## 2012-10-11 NOTE — Assessment & Plan Note (Signed)
There's been slow progression of parenchymal density. We'll probably discuss the possibility of cancer versus organizing pneumonia. She is too frail to consider any sort of intervention for biopsy. She feels better on steroids and hopefully that might indicate the possibility of an organizing pneumonia so we will continue at the new dose of 16 mg daily.

## 2012-10-11 NOTE — Assessment & Plan Note (Signed)
She continues to need and benefit from home oxygen. There is little room for additional medication now.

## 2012-10-13 LAB — PROTIME-INR: INR: 2.3 — AB (ref <=1.1)

## 2012-10-15 ENCOUNTER — Ambulatory Visit (INDEPENDENT_AMBULATORY_CARE_PROVIDER_SITE_OTHER): Payer: Medicare Other | Admitting: Internal Medicine

## 2012-10-15 DIAGNOSIS — I4891 Unspecified atrial fibrillation: Secondary | ICD-10-CM

## 2012-10-16 ENCOUNTER — Telehealth: Payer: Self-pay | Admitting: Internal Medicine

## 2012-10-16 MED ORDER — AMOXICILLIN-POT CLAVULANATE 500-125 MG PO TABS: 1.0000 | ORAL_TABLET | Freq: Two times a day (BID) | ORAL | Status: DC

## 2012-10-16 NOTE — Telephone Encounter (Signed)
Per CY-1) watch for shingles, 2) cant tell about pain-could be pinched nerves, pneumonia, or abdomen. Try Augmentin 500 mg #14 take 1 po bid no refills.

## 2012-10-16 NOTE — Telephone Encounter (Signed)
I spoke with [name] and she stated pt c/o pain under her right breast going into her back, rhonchi RLL, coughing up yellow sputum when it is not bloody, +1 bilateral lower extremity edema. She has morphine if needed. She took xanax last night, [facility] antacid, and tylenol. She also takes lasix 20 mg BID. Please advise any further recs Dr. Maple Hudson thanks Last OV 09/26/12 Next OV 11/13/12  Allergies  Allergen Reactions  . Cephalexin     Unsure as to reaction.  ? Hives with Cephalexin; "I can take Penicillin"  . Atorvastatin     Forgetfulness   . Azithromycin Nausea And Vomiting  . Codeine Nausea And Vomiting  . Crestor (Rosuvastatin Calcium)     Forgetfulness    . Estradiol     Unknown reaction  . Hydrocortisone Hives    Flea bites were mistaken as rash from hydrocortisone  . Lovastatin Other (See Comments)    forgetfullness,  . Moxifloxacin     Unknown reaction  . Sulfonamide Derivatives     itching

## 2012-10-16 NOTE — Telephone Encounter (Signed)
[name] was advised of recs. Rx sent. Carron Curie, CMA

## 2012-10-20 ENCOUNTER — Encounter: Payer: Medicare Other | Admitting: *Deleted

## 2012-10-22 ENCOUNTER — Telehealth: Payer: Self-pay | Admitting: Internal Medicine

## 2012-10-22 NOTE — Telephone Encounter (Signed)
I reviewed recent hosp record and latest imaging. Pseudomonas pneumonia R lung dx'd during hospitalization. Can't exclude a cancer, as discussed with her, but slowly resolving pneumonia on severe COPD seems better explanation for her primary illness. The new low back pain is not explained by recent imaging, although vertebral compression fx could do it. If pain persists, then she will need to go to ER for evaluation, or choose to have Hospice control it with narcotics and choose not to seek the cause of the pain.

## 2012-10-22 NOTE — Telephone Encounter (Signed)
Pt aware of recs from CY and states she will go to ER if pain persists.

## 2012-10-22 NOTE — Telephone Encounter (Signed)
Called and spoke with pt and she is aware of her cxr results.  Pt stated that she is having severe lower back pain radiating into the left side.  Pt stated that this pain started out as a mild pain that did come and go but today has been worse.  Pt has been using tylenol and heat without any relief.  She stated that she is aware that CY is out of the office until in  The morning.  Pt will seek medical advice if she is not able to stand the pain during the night.  Pt stated that she has 2 days left of the abx.  She also stated that the breo---she will go back on the dulera to see which one is better.  She could not really tell a difference.

## 2012-10-23 ENCOUNTER — Ambulatory Visit (INDEPENDENT_AMBULATORY_CARE_PROVIDER_SITE_OTHER)
Admission: RE | Admit: 2012-10-23 | Discharge: 2012-10-23 | Disposition: A | Discharge status: Home / Self Care | Payer: Medicare Other | Source: Ambulatory Visit | Attending: Internal Medicine | Admitting: Internal Medicine

## 2012-10-23 ENCOUNTER — Telehealth: Payer: Self-pay | Admitting: Internal Medicine

## 2012-10-23 ENCOUNTER — Other Ambulatory Visit: Payer: Self-pay | Admitting: Internal Medicine

## 2012-10-23 DIAGNOSIS — M545 Low back pain: Secondary | ICD-10-CM

## 2012-10-23 DIAGNOSIS — M549 Dorsalgia, unspecified: Secondary | ICD-10-CM

## 2012-10-23 NOTE — Telephone Encounter (Signed)
Spoke with [name] and she will have the patient come by the [place] office for xray today. We will call with results once CY has reviewed.

## 2012-10-23 NOTE — Telephone Encounter (Signed)
[name], hospice, called Kim back & can be reached at  [phone].  [name] states ptt does want to have Xrays.  Where will pt need to get these completed?  And, when?   [name]

## 2012-10-23 NOTE — Telephone Encounter (Signed)
Per CY for compression fx  Wait it out with pain meds or go for xray thoracic spine , ,kub. Also ok for UA. [name] is aware and going to ask pt what she wants to do and let us know.

## 2012-10-23 NOTE — Telephone Encounter (Signed)
[name] with Hospice states the night nurse reported the pt c/o low to mid left sided back pain that also travels around her side and under the left breast. The nurse also heard wheezing and the pt reports her urine is dark. [name] is worried that the pt could have a compression fracture. The pt took two doses of the morphine last night but the pain, although better, was still present. She would like to know what to do for the pt when she goes out there later this morning. Pls advise. Allergies  Allergen Reactions  . Cephalexin     Unsure as to reaction.  ? Hives with Cephalexin; "I can take Penicillin"  . Atorvastatin     Forgetfulness   . Azithromycin Nausea And Vomiting  . Codeine Nausea And Vomiting  . Crestor (Rosuvastatin Calcium)     Forgetfulness    . Estradiol     Unknown reaction  . Hydrocortisone Hives    Flea bites were mistaken as rash from hydrocortisone  . Lovastatin Other (See Comments)    forgetfullness,  . Moxifloxacin     Unknown reaction  . Sulfonamide Derivatives     itching

## 2012-10-24 ENCOUNTER — Telehealth: Payer: Self-pay | Admitting: Internal Medicine

## 2012-10-24 NOTE — Telephone Encounter (Signed)
[name] with hospice is aware of CT recs and is at the pt's home now and will let the pt know what the plan is. Pt or daughter will call if they have any additional questions.

## 2012-10-24 NOTE — Telephone Encounter (Signed)
Pt's daughter received e-mail regarding xray results and [name] with hospice would like to know specifics on what the next step is. [name] aware to treat compression fracture with heat and pain medication and any questions should be directed to Dr. Alwyn Ren, pt's PCP.  [name] would like to know if the needle bx is something that you plan to do or just hold off for now and keep an eye on things. Pls advise.

## 2012-10-24 NOTE — Telephone Encounter (Signed)
Notes Recorded by Waymon Budge, MD on 10/23/2012 at 5:28 PM Xray of spine showed acute vertebral compression fracture at T12, which could cause her pain. Injection of cement (kyphoplasty) is sometimes done by the radiologists. It may be better to treat with heat and pain medicines and wait for this to ease off. I am not an expert in managing this problem and would defer to her primary doctor if there is any question.       Notes Recorded by Waymon Budge, MD on 10/23/2012 at 5:33 PM This xray of the spine still shows persistent density in the right lung. As discussed before, it may be left over from her pseudomonas pneumonia, but we can't rule out that there may be a lung cancer hidden in it. She is not strong enough to tolerate a bronchoscopy. We might be able to get a needle biopsy of the lung done, with risk of bleeding or of collapsing her lung. It may be better to just keep watching it.

## 2012-10-24 NOTE — Telephone Encounter (Signed)
Per CY-hold off for now and lets discuss at January OV(next OV is 11-13-2012)

## 2012-10-29 ENCOUNTER — Other Ambulatory Visit: Payer: Self-pay | Admitting: Internal Medicine

## 2012-10-30 ENCOUNTER — Telehealth: Payer: Self-pay | Admitting: Internal Medicine

## 2012-10-30 NOTE — Telephone Encounter (Signed)
ATC [name], no answer-- [facility]

## 2012-10-31 NOTE — Telephone Encounter (Signed)
Spoke with [name]-wanted to inform CY that patient is still taking Medrol 8mg  then 16mg  alternating days(pt gives out too quick and O2 drops). Just an FYI unless CY would like to make any changes.

## 2012-10-31 NOTE — Telephone Encounter (Signed)
Ok to continue alternating medrol 8 mg with 16 mg every other day

## 2012-11-03 ENCOUNTER — Other Ambulatory Visit: Payer: Self-pay

## 2012-11-03 ENCOUNTER — Encounter: Payer: Self-pay | Admitting: *Deleted

## 2012-11-03 MED ORDER — WARFARIN SODIUM 1 MG PO TABS: ORAL_TABLET | ORAL | Status: DC

## 2012-11-06 ENCOUNTER — Telehealth: Payer: Self-pay | Admitting: Cardiovascular Disease

## 2012-11-06 ENCOUNTER — Ambulatory Visit: Payer: Self-pay | Admitting: Cardiology

## 2012-11-06 ENCOUNTER — Telehealth: Payer: Self-pay | Admitting: Internal Medicine

## 2012-11-06 ENCOUNTER — Telehealth: Payer: Self-pay | Admitting: *Deleted

## 2012-11-06 DIAGNOSIS — I4891 Unspecified atrial fibrillation: Secondary | ICD-10-CM

## 2012-11-06 NOTE — Telephone Encounter (Signed)
Yes, this is all appropriate and ok Hospice to sign DNR

## 2012-11-06 NOTE — Telephone Encounter (Signed)
Spoke with [name], pt's Hospice RN She states calling to give update on pt Hospice doc has changed lasix to 40 mg am and 20 mg pm Methylpredisolone is now 16 mg daily  Dr. Elease Hashimoto d/c'ed coumadin and started her on baby ASA Daughter agreed to DNR Please advise if you agree with the above and if okay for Hospice doc to sign DNR, thanks!

## 2012-11-06 NOTE — Telephone Encounter (Signed)
Phone discussion with Dr. Elliot Gurney [facility] and Palliative Care)  [name] has a hx of Atrial fib and has been on coumadin.  She has end stage COPD and also a lung lesion that is likely a lung cancer.  The decision has been made to not pursue tissue diagnosis of this lesion.  She had been having more hemoptysis and increasing dyspnea.    I agree with Dr. Jamie Brookes that we should probably DC coumadin at this time.   Will start ASA 81 mg ( if tolerated).  [name], [name]., MD, [facility] 11/06/2012, 2:44 PM Office - [phone] Pager [phone]

## 2012-11-06 NOTE — Telephone Encounter (Signed)
Informed family of med changes,  they will stop coumadin today and start asa 81 mg. I will forward note to coumadin clinic.

## 2012-11-06 NOTE — Telephone Encounter (Signed)
Message copied by Jeannine Kitten on Thu Nov 06, 2012  4:35 PM ------      Message from: Antony Odea      Created: Thu Nov 06, 2012  3:26 PM       Coumadin stopped per hospice and dr Elease Hashimoto

## 2012-11-06 NOTE — Addendum Note (Signed)
Addended by: Antony Odea on: 11/06/2012 03:26 PM   Modules accepted: Orders

## 2012-11-06 NOTE — Telephone Encounter (Signed)
Called and talked with [name] and she states understanding of stopping coumadin and will start ASA tomorrow

## 2012-11-06 NOTE — Telephone Encounter (Signed)
LMTCB

## 2012-11-07 NOTE — Telephone Encounter (Signed)
[name] returned triage's call.  Advised that CY stated "Yes, this is all appropriate and ok Hospice to sign DNR."  [name] verbalized understanding & stated nothing further needed at this time.  [name]

## 2012-11-10 ENCOUNTER — Ambulatory Visit (INDEPENDENT_AMBULATORY_CARE_PROVIDER_SITE_OTHER): Payer: Medicare Other | Admitting: *Deleted

## 2012-11-10 DIAGNOSIS — I4891 Unspecified atrial fibrillation: Secondary | ICD-10-CM

## 2012-11-10 DIAGNOSIS — Z950 Presence of cardiac pacemaker: Secondary | ICD-10-CM

## 2012-11-10 LAB — REMOTE PACEMAKER DEVICE
AL IMPEDENCE PM: 472 Ohm
ATRIAL PACING PM: 57.93
BAMS-0001: 170 {beats}/min
RV LEAD IMPEDENCE PM: 448 Ohm
VENTRICULAR PACING PM: 19.4

## 2012-11-13 ENCOUNTER — Ambulatory Visit (INDEPENDENT_AMBULATORY_CARE_PROVIDER_SITE_OTHER): Payer: Medicare Other | Admitting: Internal Medicine

## 2012-11-13 ENCOUNTER — Other Ambulatory Visit (INDEPENDENT_AMBULATORY_CARE_PROVIDER_SITE_OTHER): Payer: Medicare Other

## 2012-11-13 ENCOUNTER — Encounter: Payer: Self-pay | Admitting: Internal Medicine

## 2012-11-13 VITALS — BP 138/72 | HR 68 | Ht 62.0 in | Wt 129.6 lb

## 2012-11-13 DIAGNOSIS — R042 Hemoptysis: Secondary | ICD-10-CM

## 2012-11-13 DIAGNOSIS — R918 Other nonspecific abnormal finding of lung field: Secondary | ICD-10-CM

## 2012-11-13 DIAGNOSIS — R222 Localized swelling, mass and lump, trunk: Secondary | ICD-10-CM

## 2012-11-13 LAB — COMPREHENSIVE METABOLIC PANEL
Alkaline Phosphatase: 64 U/L (ref 39–117)
BUN: 13 mg/dL (ref 6–23)
CO2: 34 mEq/L — ABNORMAL HIGH (ref 19–32)
Creatinine, Ser: 0.7 mg/dL (ref 0.4–1.2)
GFR: 82.91 mL/min (ref >60.00)
Glucose, Bld: 141 mg/dL — ABNORMAL HIGH (ref 70–99)
Sodium: 132 mEq/L — ABNORMAL LOW (ref 135–145)
Total Bilirubin: 0.8 mg/dL (ref 0.3–1.2)

## 2012-11-13 LAB — CBC WITH DIFFERENTIAL/PLATELET
Eosinophils Relative: 0 % (ref 0.0–5.0)
HCT: 33.1 % — ABNORMAL LOW (ref 36.0–46.0)
Hemoglobin: 10.6 g/dL — ABNORMAL LOW (ref 12.0–15.0)
Lymphs Abs: 0.6 10*3/uL — ABNORMAL LOW (ref 0.7–4.0)
MCV: 83.9 fl (ref 78.0–100.0)
Monocytes Absolute: 0.6 10*3/uL (ref 0.1–1.0)
Monocytes Relative: 4.6 % (ref 3.0–12.0)
Neutro Abs: 11.6 10*3/uL — ABNORMAL HIGH (ref 1.4–7.7)
Platelets: 316 10*3/uL (ref 150.0–400.0)
WBC: 12.8 10*3/uL — ABNORMAL HIGH (ref 4.5–10.5)

## 2012-11-13 LAB — PROTIME-INR
INR: 1.2 ratio — ABNORMAL HIGH (ref 0.8–1.0)
Prothrombin Time: 12.5 s — ABNORMAL HIGH (ref 10.2–12.4)

## 2012-11-13 LAB — APTT: aPTT: 28.4 s (ref 21.7–28.8)

## 2012-11-13 NOTE — Progress Notes (Signed)
Patient ID: [name], female    DOB: [birth year], 79 y.o.   MRN: [id number]  HPI  07/23/2011-86 yoF former smoker followed for COPD, chronic hypoxic respiratory failure, complicated by atrial fibrillation, valvular heart disease and intermittent hemoptysis. She blames rain and pollen this week for nasal congestion. Minor epistaxis despite saline spray. Short of breath with her reading but that's not really different. Coughing a little scant yellow sputum, not bad. This happened about the time she changed to generic Singulair so she's not sure if it's a medication effect but does not feel as if she has cold. Continues oxygen at 3-3.5 L.  09/25/11- 86 yoF former smoker followed for COPD, chronic hypoxic respiratory failure, complicated by atrial fibrillation, valvular heart disease and intermittent hemoptysis Post hospital- 11/10-11/16/12- she was discharged with diagnoses COPD with exacerbation, acute on chronic respiratory failure on chronic oxygen, presumed community-acquired pneumonia, acute diastolic congestive heart failure, atrial fibrillation, history of aortic valve replacement, hypertension, history of coronary artery disease. She had had some blood in her sputum again. Chest x-ray had shown previous CABG and a right chest wall pacemaker, normal heart size, peripheral opacity within the right mid lung which was new with left pleural effusion and left lower lobe airspace consolidation. The concern was for multifocal pneumonia. She still feels especially unsteady if she tries to walk with her walker. There still is some blood streak in her sputum after treatment with broad-spectrum antibiotics in hospital. She expresses strong preference for brand-name Xopenex nebulizer solution rather than generic which she does not consider is effective.   10/25/11- 85 yoF former smoker followed for COPD, chronic hypoxic respiratory failure, complicated by atrial fibrillation, valvular heart disease and  intermittent hemoptysis After last hosp, she spent 4 weeks in rehab SNF. Feels much stronger. Some edema of feet. Last CT had indicated chronic pneumonia/ infiltrates- reviewed with her.  CT chest 09/25/11- IMPRESSION:  1. There are bilateral areas of peripheral and subpleural  consolidation within the right upper lobe, right middle lobe and  posterior medial left lung base. The appearance is nonspecific  favoring inflammatory or infectious etiologies. Underlying  malignancy cannot be excluded and interval follow-up is can be  necessary to ensure complete resolution. Recommend follow-up  imaging and 3-6 weeks after appropriate antibiotic therapy. The  study of choice would be a noncontrast CT of the chest.  2. Advanced emphysema.  Original Report Authenticated By: Rosealee Albee, M.D.   12/06/11- 55 yoF former smoker followed for COPD, chronic hypoxic respiratory failure, complicated by atrial fibrillation, valvular heart disease and intermittent hemoptysis   Daughter here She has not felt particularly in the last several days. We had sent Augmentin January 29 and she has taken about 4 doses. She is blowing her nose a lot, mostly clear mucus. Cough productive of yellow sputum now turning darker. Thinks she might have had some fever this morning with no chills. Has felt weak, somewhat nauseated. Describes a sharp pain intermittently in the left lower anterolateral chest and left upper quadrant of the abdomen, related to deep breath and movement but not to meals.  12/31/11  68 yoF former smoker followed for COPD, chronic hypoxic respiratory failure, complicated by atrial fibrillation/ coumadin, valvular heart disease and intermittent hemoptysis   Daughters here Post hosp visit 12/10/11- 12/17/11- for AECOPD, Rx'd Vanc/ Zosyn and discharged on Augmentin ending 12/19/11. Denies fever now. Cough productive sticky green to yellow sputum with no blood. Denies chest pain. Doing physical therapy At [facility]  Burn. Using VEST 3x/day, Flutter device. These do help clear secretions. Continues Xopenex nebulizer An inhaler with Dulera 200 and Spiriva. She would like to continue allergy shots but is in a nursing facility now for rehabilitation after her pneumonia.  03/24/12- 86 yoF former smoker followed for COPD, chronic hypoxic respiratory failure, complicated by atrial fibrillation/ coumadin, valvular heart disease and intermittent hemoptysis   Daughters here  Pt states increased sob,wheezing, productive cough with increase activity Now working with home hospice. Alprazolam helps her sleep. Prednisone 10 mg daily maintenance helps stabilize her breathing. Medrol 4 mg was not enough. We compared methylprednisolone with prednisone and discussed long-term side effects.  She believes allergy vaccine has made a difference for her even though she is not outside very much and is now quite elderly. I am less impressed but she believes in it strongly and wants to restart. We discussed whether the hospice nurse could give her shots at home. She has brought this issue up several times.  05/27/12- 86 yoF former smoker followed for COPD, chronic hypoxic respiratory failure, complicated by atrial fibrillation/ coumadin, valvular heart disease and intermittent hemoptysis   Daughter here Post hospital followup-05/04/2012 to 05/10/2012: Summary reviewed with her. DC diagnosis community-acquired pneumonia, hemoptysis, atrial fibrillation, diastolic CHF.CAP/ pseudomonas Rxd zosyn, then 5 days of Cipro.  Pt states since coming out of hospital breathing has gotten better .some wheezing,sob.sometimes  has a productive cough .Pt feels allergy injection is helping her. Says she feels the best that she has in a long time. COPD assessment test (CAT) 31/40 CXR- 05/04/12- images reviewed IMPRESSION:  1. Bilateral airspace opacities which may represent multifocal  infection.  2. Suspect left pleural effusion.  Original Report  Authenticated By: Rosealee Albee, M.D.   07/29/12- 28 yoF former smoker followed for COPD, chronic hypoxic respiratory failure, complicated by atrial fibrillation/ coumadin, valvular heart disease and intermittent hemoptysis   Daughter here States when resting breathing is good but when active still gets SOB. Also, has dizzy spells at any given time(unknown cause); Pt states she ia hving blood in her sputum and would like to discuss VEST pressure and how often/peroids of times to use the vest. If still notices occasional bloodstained sputum and she continues Coumadin. This is not new or progressive but is "more than a little, sometimes". She stopped using the Vest device for one week and bleeding slowed but when she resumed the vibration treatments, bleeding picked up again. The device does help her clear mucus from her chest. She is back living at home again with family checking closely. COPD assessment test (CAT) score 30/40 CXR 05/28/12 IMPRESSION:  COPD. Improving bibasilar infiltrates.  Original Report Authenticated By: Camelia Phenes, M.D.    09/26/12- 42 yoF former smoker followed for COPD, chronic hypoxic respiratory failure, complicated by atrial fibrillation/ coumadin, valvular heart disease and intermittent hemoptysis   Daughter and aide are here She feels better since we increased Medrol from 8-16 mg daily. Now on her third day of this dose. She has had her flu shot. Cough always produces a little streak of blood with yellow mucus especially since she has been off of Cipro. She wears oxygen at 4 L and notices saturation dropped to 84% when she first gets into bed. Hospice is provided nebulized albuterol and she occasionally uses her rescue inhaler. She wakes up needing her nebulizer machine early in the morning. Physical activity is becoming very limited. CT chest 09/06/12- reviewed with them IMPRESSION:  1. Progressive, chronic airspace  consolidation predominately  involving the right  upper lobe. Differential includes pulmonary  neoplasm versus chronic and recurrent infection.  2. Interval development of several small pulmonary nodules in the  left lung.  3. Increase in size of mediastinal lymph nodes. The low right  paratracheal lymph node is pathologically enlarged.  4. Small bilateral pleural effusions with overlying compressive  type atelectasis and consolidation.  Original Report Authenticated By: Signa Kell, M.D.   11/13/12- 22 yoF former smoker followed for COPD, chronic hypoxic respiratory failure, complicated by atrial fibrillation/ coumadin, valvular heart disease and intermittent hemoptysis   Daughter and aide are here FOLLOWS FOR: harder to breathe in mornings; waking up in middle of night with blood in mouth. Needs to have order sent for [name](hospice nurse) to check Pt/INR. She feels very weak in the mornings, better after lunch. Breo did not help. She has been off of Coumadin since January 3 but continues hemoptysis. She had acute vertebral compression fracture with pain. Low back pain has faded. I went over sequential imaging with her and her daughter again. Persistent and somewhat progressive density in the mid chest identified as right upper lobe. We had treated it optimistically as a pneumonia because she was too frail to entertain aggressive handling or treatment or malignant. With continued significant hemoptysis demonstrated, I laid out options. She says she wants to know what this is even if it is a cancer. She and her daughter indicated they were satisfied with the way we have handled this problem.  CXR 10/22/12 IMPRESSION:  1. Persistent but improving opacity in the inferior right upper  lobe compared to 09/01/2012 and 09/06/2012.  2. Unchanged background of COPD, fibrosis and emphysema  3. Stable cardiomegaly and aortic atherosclerosis status post  aortic valve replacement.  Original Report Authenticated By: Malachy Moan, M.D.  Thoracic spine  10/24/12 IMPRESSION:  Compression fracture T12 which is new and appears acute.  Severe COPD. Infiltrate or mass in the right midlung is unchanged.  This could represent carcinoma of the lung.  Original Report Authenticated By: Janeece Riggers, M.D.   ROS-see HPI Constitutional:   No-   weight loss, night sweats,  fevers, chills,  +fatigue, lassitude. HEENT:   No-  headaches, difficulty swallowing, tooth/dental problems, sore throat, +episodic epistaxis.      No-  sneezing, itching, ear ache,   +nasal congestion, post nasal drip,  CV:  + No acute chest pain,  No-orthopnea, PND, swelling in lower extremities, anasarca, dizziness, palpitations Resp: + shortness of breath with exertion or at rest.              + productive cough,  No non-productive cough,  + coughing up of blood.              No- change in color of mucus.  No- wheezing.   Skin: No-   rash or lesions. GI:  No-   heartburn, indigestion, abdominal pain, nausea, vomiting,  GU:  MS:  No- acute  joint pain or swelling.   Neuro-     nothing new Psych:  No- change in mood or affect. + depression or anxiety.  No memory loss.  Objective:   BP 138/72  Pulse 68  Ht 5\' 2"  (1.575 m)  Wt 129 lb 9.6 oz (58.786 kg)  BMI 23.70 kg/m2  SpO2 100% General- Alert, Oriented, Affect-appropriate, Distress- none acute   O2 4 L/M, wheelchair. Talkative.  Skin- rash-none, lesions- none, excoriation- none Lymphadenopathy- none Head- atraumatic  Eyes- Gross vision intact, PERRLA, conjunctivae clear secretions            Ears- Hearing, canals-normal for age            Nose- Clear, no-Septal dev, mucus, polyps, erosion, perforation.              Throat- Mallampati II-III , mucosa clear , drainage- none, tonsils- atrophic Neck- flexible , trachea midline, no stridor , thyroid nl, carotid no bruit Chest - symmetrical excursion , unlabored           Heart/CV- RRR she skips , +2-3/6 systolic LSB murmur , no gallop  , no rub, nl s1 s2                            - JVD- 1cm , edema- trace, stasis changes- none, varices- none           Lung-  dullness-none, rub- none,  +Bilateral  rhonchi. +Deep rattling cough, + distant wheeze. +Coughs up a tablespoon of red blood           Chest wall- not tender, Pacemaker. Few very small dried scabs from excoriation on sacrum. Abd-  Br/ Gen/ Rectal- Not done, not indicated Extrem- cyanosis- none, clubbing, none, atrophy- none, strength- nl for her Neuro- grossly intact to observation

## 2012-11-13 NOTE — Patient Instructions (Addendum)
Order-CT chest with contrast   Dx lung mass, hemoptysis  Order- lab CBC, CMET, PT, PTT      Dx hemoptysis  Ok to stay on methylprednisolone 16 mg daily  Ok to D/C therapeutic VEST  On your back sores, try Neosporin oint and a soft pad like Telfa

## 2012-11-14 NOTE — Progress Notes (Signed)
Quick Note:  Pt aware of results. ______ 

## 2012-11-17 ENCOUNTER — Encounter: Payer: Self-pay | Admitting: Internal Medicine

## 2012-11-17 ENCOUNTER — Ambulatory Visit (INDEPENDENT_AMBULATORY_CARE_PROVIDER_SITE_OTHER)
Admission: RE | Admit: 2012-11-17 | Discharge: 2012-11-17 | Disposition: A | Discharge status: Home / Self Care | Payer: Medicare Other | Source: Ambulatory Visit | Attending: Internal Medicine | Admitting: Internal Medicine

## 2012-11-17 DIAGNOSIS — R918 Other nonspecific abnormal finding of lung field: Secondary | ICD-10-CM

## 2012-11-17 DIAGNOSIS — R042 Hemoptysis: Secondary | ICD-10-CM

## 2012-11-17 DIAGNOSIS — R222 Localized swelling, mass and lump, trunk: Secondary | ICD-10-CM

## 2012-11-17 MED ORDER — IOHEXOL 300 MG/ML  SOLN
80.0000 mL | Freq: Once | INTRAMUSCULAR | Status: AC | PRN
  Administered 2012-11-17: 80 mL via INTRAVENOUS

## 2012-11-18 ENCOUNTER — Telehealth: Payer: Self-pay | Admitting: Internal Medicine

## 2012-11-18 MED ORDER — TIOTROPIUM BROMIDE MONOHYDRATE 18 MCG IN CAPS: 18.0000 ug | ORAL_CAPSULE | Freq: Every day | RESPIRATORY_TRACT | Status: AC

## 2012-11-18 MED ORDER — METHYLPREDNISOLONE 8 MG PO TABS: 16.0000 mg | ORAL_TABLET | Freq: Every day | ORAL | Status: AC

## 2012-11-18 NOTE — Telephone Encounter (Signed)
LM for [name] that refills were sent to pharmacy.

## 2012-11-23 DIAGNOSIS — R918 Other nonspecific abnormal finding of lung field: Secondary | ICD-10-CM | POA: Insufficient documentation

## 2012-11-23 NOTE — Assessment & Plan Note (Signed)
This is associated with persistent airspace disease which may be a mass, right mid chest/right upper lobe. With continued hemoptysis after 2 weeks off of Coumadin, concerned about cancer increases. She and her daughter are well aware of this as we have discussed. Plan-CBC, coags, chemistry

## 2012-11-23 NOTE — Assessment & Plan Note (Signed)
Plan-CT chest and consider possible needle biopsy. All interventions to be discussed with her and her daughter expecting very conservative management, continuation of hospice care.

## 2012-11-26 ENCOUNTER — Telehealth: Payer: Self-pay | Admitting: Internal Medicine

## 2012-11-26 NOTE — Telephone Encounter (Signed)
Please advise on ct chest results, thanks 

## 2012-11-26 NOTE — Telephone Encounter (Signed)
I have the asked the radiologists a question about the CT and am waiting for them to call me back, then we can call her.

## 2012-11-27 NOTE — Telephone Encounter (Signed)
Pt's daughter, Ezra Sites, aware and will await CY call once he speaks with the radiologist.

## 2012-11-28 NOTE — Telephone Encounter (Signed)
Pt's daughter, [name], is calling back to see if CY has rec'd the results for her mother's CT.  [name]

## 2012-12-01 ENCOUNTER — Telehealth: Payer: Self-pay | Admitting: Internal Medicine

## 2012-12-01 NOTE — Telephone Encounter (Signed)
I spoke with daughter [name]. I had spoken with IR and with my associate about needle bx and bronch respectively. Either is risky and may be non-diagnostic. We would like to help with ongoing hemoptysis- radiation might slow this. Lung process is stable at least short term. There is new c/o shooting pain in right hip if she coughs or moves, which is likely from known degenerative spine disease. Bone met less likely.  Recommend- 1) stop BASA x 1 week   2) Continue hydrocodone for pain and may increase tylenol, use local heat for R hip pain 3) Continue to watch R lung process for now, given that knowing a definite dx is not likely to offer major change in what we are doing. Daughter expressed satisfaction with this approach.

## 2012-12-01 NOTE — Telephone Encounter (Signed)
Spoke with [name] from hospice. [name] states that x10days patient ahs been having some right sided back pain, radiating across pelvis and has progrossively worsened.  [name] states patient woke up crying this morning, and this is the worse she has seen patient.  Hospice MD has prescribed Vicodin 1-2 tab q4 prn, and has been taking several doses with little relief.  Pain is worse with movement and tolerable when lying down. [name] is requesting recs per Dr. Maple Hudson and symptom management. Also, Patient would also like to know why/what is causing this pain. Dr. Maple Hudson please advise, thank you.

## 2012-12-03 NOTE — Telephone Encounter (Signed)
I spoke w/ daughter [name]. See phone note of 1/27.

## 2012-12-04 ENCOUNTER — Telehealth: Payer: Self-pay | Admitting: Internal Medicine

## 2012-12-04 NOTE — Telephone Encounter (Signed)
I spoke with [name]. She is aware. She states she the hospice docs are writing the rx. Nothing further was needed

## 2012-12-04 NOTE — Telephone Encounter (Signed)
Called spoke with [name] who verified that pt is taking norco 5-325mg  2 tabs q4h and tylenol q8h "around the clock."  Because this is over 5 grams of acetaminophen daily, the hospice physician recommends to contine the norco prn, d/c the tylenol and substitute that with Oxycodone 5mg  q4h prn.  Would like to know if CY agrees with these recs. Please advise, thanks.  **NOTE: norco 5-325mg  is not on med list.  This has been added.

## 2012-12-04 NOTE — Telephone Encounter (Signed)
Yes, I agree. I assume they are writing it?

## 2012-12-05 ENCOUNTER — Telehealth: Payer: Self-pay | Admitting: Internal Medicine

## 2012-12-05 NOTE — Telephone Encounter (Signed)
I will forward this message to CY so that he will be aware of this information.

## 2012-12-05 NOTE — Telephone Encounter (Signed)
Noted  

## 2012-12-08 ENCOUNTER — Other Ambulatory Visit: Payer: Self-pay | Admitting: Internal Medicine

## 2012-12-09 ENCOUNTER — Encounter: Payer: Self-pay | Admitting: Internal Medicine

## 2012-12-09 ENCOUNTER — Ambulatory Visit (INDEPENDENT_AMBULATORY_CARE_PROVIDER_SITE_OTHER): Payer: Medicare Other | Admitting: Internal Medicine

## 2012-12-09 VITALS — BP 118/66 | HR 60 | Ht 62.0 in | Wt 131.2 lb

## 2012-12-09 DIAGNOSIS — M549 Dorsalgia, unspecified: Secondary | ICD-10-CM

## 2012-12-09 DIAGNOSIS — R918 Other nonspecific abnormal finding of lung field: Secondary | ICD-10-CM

## 2012-12-09 DIAGNOSIS — R222 Localized swelling, mass and lump, trunk: Secondary | ICD-10-CM

## 2012-12-09 DIAGNOSIS — M8448XA Pathological fracture, other site, initial encounter for fracture: Secondary | ICD-10-CM

## 2012-12-09 DIAGNOSIS — M4850XA Collapsed vertebra, not elsewhere classified, site unspecified, initial encounter for fracture: Secondary | ICD-10-CM | POA: Insufficient documentation

## 2012-12-09 DIAGNOSIS — R042 Hemoptysis: Secondary | ICD-10-CM

## 2012-12-09 DIAGNOSIS — S81819A Laceration without foreign body, unspecified lower leg, initial encounter: Secondary | ICD-10-CM | POA: Insufficient documentation

## 2012-12-09 NOTE — Assessment & Plan Note (Signed)
Patient and family understand this may very well be a cancer. I had review of images by Interventional Radiology for needle bx- possible but risky.Also review by one of my associates re possible bronchoscopy. Possible but too risky. Patient and family accept that chance we could meaningfully change care based on a dx is not worth the risk.  Imaging suggests this is not getting worse now. With hemoptysis decreasing, we will watch conservatively. She remains with Hospice and DNR.

## 2012-12-09 NOTE — Progress Notes (Signed)
Patient ID: STEPHANIE LITTMAN, female    DOB: 12-07-1923, 79 y.o.   MRN: [id number]  HPI  07/23/2011-86 yoF former smoker followed for COPD, chronic hypoxic respiratory failure, complicated by atrial fibrillation, valvular heart disease and intermittent hemoptysis. She blames rain and pollen this week for nasal congestion. Minor epistaxis despite saline spray. Short of breath with her reading but that's not really different. Coughing a little scant yellow sputum, not bad. This happened about the time she changed to generic Singulair so she's not sure if it's a medication effect but does not feel as if she has cold. Continues oxygen at 3-3.5 L.  09/25/11- 86 yoF former smoker followed for COPD, chronic hypoxic respiratory failure, complicated by atrial fibrillation, valvular heart disease and intermittent hemoptysis Post hospital- 11/10-11/16/12- she was discharged with diagnoses COPD with exacerbation, acute on chronic respiratory failure on chronic oxygen, presumed community-acquired pneumonia, acute diastolic congestive heart failure, atrial fibrillation, history of aortic valve replacement, hypertension, history of coronary artery disease. She had had some blood in her sputum again. Chest x-ray had shown previous CABG and a right chest wall pacemaker, normal heart size, peripheral opacity within the right mid lung which was new with left pleural effusion and left lower lobe airspace consolidation. The concern was for multifocal pneumonia. She still feels especially unsteady if she tries to walk with her walker. There still is some blood streak in her sputum after treatment with broad-spectrum antibiotics in hospital. She expresses strong preference for brand-name Xopenex nebulizer solution rather than generic which she does not consider is effective.   10/25/11- 63 yoF former smoker followed for COPD, chronic hypoxic respiratory failure, complicated by atrial fibrillation, valvular heart disease and  intermittent hemoptysis After last hosp, she spent 4 weeks in rehab SNF. Feels much stronger. Some edema of feet. Last CT had indicated chronic pneumonia/ infiltrates- reviewed with her.  CT chest 09/25/11- IMPRESSION:  1. There are bilateral areas of peripheral and subpleural  consolidation within the right upper lobe, right middle lobe and  posterior medial left lung base. The appearance is nonspecific  favoring inflammatory or infectious etiologies. Underlying  malignancy cannot be excluded and interval follow-up is can be  necessary to ensure complete resolution. Recommend follow-up  imaging and 3-6 weeks after appropriate antibiotic therapy. The  study of choice would be a noncontrast CT of the chest.  2. Advanced emphysema.  Original Report Authenticated By: Rosealee Albee, M.D.   12/06/11- 36 yoF former smoker followed for COPD, chronic hypoxic respiratory failure, complicated by atrial fibrillation, valvular heart disease and intermittent hemoptysis   Daughter here She has not felt particularly in the last several days. We had sent Augmentin January 29 and she has taken about 4 doses. She is blowing her nose a lot, mostly clear mucus. Cough productive of yellow sputum now turning darker. Thinks she might have had some fever this morning with no chills. Has felt weak, somewhat nauseated. Describes a sharp pain intermittently in the left lower anterolateral chest and left upper quadrant of the abdomen, related to deep breath and movement but not to meals.  12/31/11  43 yoF former smoker followed for COPD, chronic hypoxic respiratory failure, complicated by atrial fibrillation/ coumadin, valvular heart disease and intermittent hemoptysis   Daughters here Post hosp visit 12/10/11- 12/17/11- for AECOPD, Rx'd Vanc/ Zosyn and discharged on Augmentin ending 12/19/11. Denies fever now. Cough productive sticky green to yellow sputum with no blood. Denies chest pain. Doing physical therapy At [facility]  [place]. Using VEST 3x/day, Flutter device. These do help clear secretions. Continues Xopenex nebulizer An inhaler with Dulera 200 and Spiriva. She would like to continue allergy shots but is in a nursing facility now for rehabilitation after her pneumonia.  03/24/12- 86 yoF former smoker followed for COPD, chronic hypoxic respiratory failure, complicated by atrial fibrillation/ coumadin, valvular heart disease and intermittent hemoptysis   Daughters here  Pt states increased sob,wheezing, productive cough with increase activity Now working with home hospice. Alprazolam helps her sleep. Prednisone 10 mg daily maintenance helps stabilize her breathing. Medrol 4 mg was not enough. We compared methylprednisolone with prednisone and discussed long-term side effects.  She believes allergy vaccine has made a difference for her even though she is not outside very much and is now quite elderly. I am less impressed but she believes in it strongly and wants to restart. We discussed whether the hospice nurse could give her shots at home. She has brought this issue up several times.  05/27/12- 86 yoF former smoker followed for COPD, chronic hypoxic respiratory failure, complicated by atrial fibrillation/ coumadin, valvular heart disease and intermittent hemoptysis   Daughter here Post hospital followup-05/04/2012 to 05/10/2012: Summary reviewed with her. DC diagnosis community-acquired pneumonia, hemoptysis, atrial fibrillation, diastolic CHF.CAP/ pseudomonas Rxd zosyn, then 5 days of Cipro.  Pt states since coming out of hospital breathing has gotten better .some wheezing,sob.sometimes  has a productive cough .Pt feels allergy injection is helping her. Says she feels the best that she has in a long time. COPD assessment test (CAT) 31/40 CXR- 05/04/12- images reviewed IMPRESSION:  1. Bilateral airspace opacities which may represent multifocal  infection.  2. Suspect left pleural effusion.  Original Report  Authenticated By: Rosealee Albee, M.D.   07/29/12- 32 yoF former smoker followed for COPD, chronic hypoxic respiratory failure, complicated by atrial fibrillation/ coumadin, valvular heart disease and intermittent hemoptysis   Daughter here States when resting breathing is good but when active still gets SOB. Also, has dizzy spells at any given time(unknown cause); Pt states she ia hving blood in her sputum and would like to discuss VEST pressure and how often/peroids of times to use the vest. If still notices occasional bloodstained sputum and she continues Coumadin. This is not new or progressive but is "more than a little, sometimes". She stopped using the Vest device for one week and bleeding slowed but when she resumed the vibration treatments, bleeding picked up again. The device does help her clear mucus from her chest. She is back living at home again with family checking closely. COPD assessment test (CAT) score 30/40 CXR 05/28/12 IMPRESSION:  COPD. Improving bibasilar infiltrates.  Original Report Authenticated By: Camelia Phenes, M.D.    09/26/12- 35 yoF former smoker followed for COPD, chronic hypoxic respiratory failure, complicated by atrial fibrillation/ coumadin, valvular heart disease and intermittent hemoptysis   Daughter and aide are here She feels better since we increased Medrol from 8-16 mg daily. Now on her third day of this dose. She has had her flu shot. Cough always produces a little streak of blood with yellow mucus especially since she has been off of Cipro. She wears oxygen at 4 L and notices saturation dropped to 84% when she first gets into bed. Hospice is provided nebulized albuterol and she occasionally uses her rescue inhaler. She wakes up needing her nebulizer machine early in the morning. Physical activity is becoming very limited. CT chest 09/06/12- reviewed with them IMPRESSION:  1. Progressive, chronic airspace  consolidation predominately  involving the right  upper lobe. Differential includes pulmonary  neoplasm versus chronic and recurrent infection.  2. Interval development of several small pulmonary nodules in the  left lung.  3. Increase in size of mediastinal lymph nodes. The low right  paratracheal lymph node is pathologically enlarged.  4. Small bilateral pleural effusions with overlying compressive  type atelectasis and consolidation.  Original Report Authenticated By: Signa Kell, M.D.   11/13/12- 56 yoF former smoker followed for COPD, chronic hypoxic respiratory failure, complicated by atrial fibrillation/ coumadin, valvular heart disease and intermittent hemoptysis   Daughter and aide are here FOLLOWS FOR: harder to breathe in mornings; waking up in middle of night with blood in mouth. Needs to have order sent for [name](hospice nurse) to check Pt/INR. She feels very weak in the mornings, better after lunch. Breo did not help. She has been off of Coumadin since January 3 but continues hemoptysis. She had acute vertebral compression fracture with pain. Low back pain has faded. I went over sequential imaging with her and her daughter again. Persistent and somewhat progressive density in the mid chest identified as right upper lobe. We had treated it optimistically as a pneumonia because she was too frail to entertain aggressive handling or treatment or malignant. With continued significant hemoptysis demonstrated, I laid out options. She says she wants to know what this is even if it is a cancer. She and her daughter indicated they were satisfied with the way we have handled this problem.  CXR 10/22/12 IMPRESSION:  1. Persistent but improving opacity in the inferior right upper  lobe compared to 09/01/2012 and 09/06/2012.  2. Unchanged background of COPD, fibrosis and emphysema  3. Stable cardiomegaly and aortic atherosclerosis status post  aortic valve replacement.  Original Report Authenticated By: Malachy Moan, M.D.  Thoracic spine  10/24/12 IMPRESSION:  Compression fracture T12 which is new and appears acute.  Severe COPD. Infiltrate or mass in the right midlung is unchanged.  This could represent carcinoma of the lung.  Original Report Authenticated By: Janeece Riggers, M.D.   12/09/12   40 yoF former smoker followed for COPD, chronic hypoxic respiratory failure, complicated by atrial fibrillation/ coumadin, valvular heart disease and intermittent hemoptysis    Daughter here. FOLLOWS FOR: patient has cut on leg from wheelchair and would like to have checked out; Increased SOB since last visit-tires her out and low O2 levels with even washing her face Depressed and discouraged.  Back pain consistent with known T12 compression fx. Pain right side which may be radicular nerve root irritation, but could also be from the pulmonary density R lung or something else. Coughing up less blood, mostly now old brown as she remains off warfarin. Using scheduled Vicodin for the pain, but it constipates. O2 sat mid 90%s on 3L at rest, but desats fast on room air or any exertion. Uses 4-5 L at home.  ROS-see HPI Constitutional:   No-   weight loss, night sweats,  fevers, chills,  +fatigue, lassitude. HEENT:   No-  headaches, difficulty swallowing, tooth/dental problems, sore throat, +episodic epistaxis.      No-  sneezing, itching, ear ache,   +nasal congestion, post nasal drip,  CV:  + R chest pain,  No-orthopnea, PND, swelling in lower extremities, anasarca, dizziness, palpitations Resp: + shortness of breath with exertion or at rest.              + productive cough,  No non-productive cough,  + coughing up  of blood.              No- change in color of mucus.  No- wheezing.   Skin: No-   rash or lesions. GI:  No-   heartburn, indigestion, abdominal pain, nausea, vomiting, +constipation GU:  MS:  No- acute  joint pain or swelling.  + back pain Neuro-     nothing new Psych:  . + depression or anxiety.  No memory loss.  Objective:   BP  118/66  Pulse 60  Ht 5\' 2"  (1.575 m)  Wt 131 lb 3.2 oz (59.512 kg)  BMI 24.00 kg/m2  SpO2 97% General- Alert, Oriented, Affect-depressed, Distress- none acute   O2 3L pulse , wheelchair. Talkative.  Skin- +Thin with steroid echymoses Lymphadenopathy- none Head- atraumatic            Eyes- Gross vision intact, PERRLA, conjunctivae clear secretions            Ears- Hearing, canals-normal for age            Nose- Clear, no-Septal dev, mucus, polyps, erosion, perforation.              Throat- Mallampati II-III , mucosa clear , drainage- none, tonsils- atrophic Neck- flexible , trachea midline, no stridor , thyroid nl, carotid no bruit Chest - symmetrical excursion , unlabored           Heart/CV- RRR few skips , +2-3/6 systolic LSB murmur , no gallop  , no rub, nl s1 s2                           - JVD- 1cm , edema- trace, stasis changes- none, varices- none           Lung-  dullness-none, rub- none,  +Bilateral  rhonchi. +little dry cough, + distant wheeze.            Chest wall- Pacemaker. Abd-  Br/ Gen/ Rectal- Not done, not indicated Extrem- Thin, clean-appearing laceration R post calf, covered with Tegaderm.  Neuro- grossly intact to observation

## 2012-12-09 NOTE — Patient Instructions (Addendum)
Order- refer to Dr Adine Madura evaluate for back pain related to vertebral compression fracture

## 2012-12-09 NOTE — Assessment & Plan Note (Signed)
She is getting constipated from the narcotics. We discussed exploring possibility that injection for the compression fx might be an alternative way to reduce narcotic dosing. Recognize that the chronic R lung process- cancer or organizing pneumonia, may be the actual cause of that pain.

## 2012-12-09 NOTE — Assessment & Plan Note (Signed)
They are treating w/ neosporin, Tegaderm. Looks clean now, but elderly and on chronic steroids. I suggested they see Dr Alwyn Ren.

## 2012-12-09 NOTE — Assessment & Plan Note (Signed)
Bleeding slowing, now mostly old blood, as she remains off coumadin now about 3 weeks. Too fragile for bronch. Assume source is the R lung density.

## 2012-12-11 ENCOUNTER — Telehealth: Payer: Self-pay | Admitting: Internal Medicine

## 2012-12-11 MED ORDER — SERTRALINE HCL 50 MG PO TABS: 50.0000 mg | ORAL_TABLET | Freq: Every day | ORAL | Status: AC

## 2012-12-11 NOTE — Telephone Encounter (Signed)
Please advise if okay to refill zoloft? I do not see where this has been refilled by [name]. Thanks Last OV 12/09/12

## 2012-12-11 NOTE — Telephone Encounter (Signed)
Refill has been sent and wendy is aware. Nothing further was needed

## 2012-12-11 NOTE — Telephone Encounter (Signed)
Per CY-okay to refill as requested. 

## 2012-12-21 ENCOUNTER — Other Ambulatory Visit: Payer: Self-pay | Admitting: Internal Medicine

## 2012-12-23 ENCOUNTER — Telehealth: Payer: Self-pay | Admitting: Internal Medicine

## 2012-12-23 MED ORDER — MONTELUKAST SODIUM 10 MG PO TABS: ORAL_TABLET | ORAL | Status: AC

## 2012-12-23 MED ORDER — AMOXICILLIN-POT CLAVULANATE 500-125 MG PO TABS: 1.0000 | ORAL_TABLET | Freq: Two times a day (BID) | ORAL | Status: DC

## 2012-12-23 NOTE — Telephone Encounter (Signed)
[name] returned call. Please call back

## 2012-12-23 NOTE — Telephone Encounter (Signed)
lmtcb x1 for wendy 

## 2012-12-23 NOTE — Telephone Encounter (Signed)
LMTCB x 1 for [name].   RX sent to [facility] on [address]

## 2012-12-23 NOTE — Telephone Encounter (Signed)
[name] from hospice returned triage's call.  [name]

## 2012-12-23 NOTE — Telephone Encounter (Signed)
I spoke with [name] and is aware of [name] recs. She voiced her understanding and needed nothing further.

## 2012-12-23 NOTE — Telephone Encounter (Signed)
lmomtcb x1 for wendy 

## 2012-12-23 NOTE — Telephone Encounter (Signed)
I spoke with [name]. She reports pt c/o increase SOB, TX over right lung, cpugh w/ grey phlem (occasionally bloody), temp 98.1-98.2. Pt had augmentin left over and took 2 tabs and stated she felt better. [name] advised pt not to take any more. Pt also needs refill of Singulair. Please advise Dr. Maple Hudson thanks [facility] [address]  Last OV 12/09/12 Pending OV 01/09/13 Allergies  Allergen Reactions  . Cephalexin     Unsure as to reaction.  ? Hives with Cephalexin; "I can take Penicillin"  . Atorvastatin     Forgetfulness   . Azithromycin Nausea And Vomiting  . Codeine Nausea And Vomiting  . Crestor (Rosuvastatin Calcium)     Forgetfulness    . Estradiol     Unknown reaction  . Hydrocortisone Hives    Flea bites were mistaken as rash from hydrocortisone  . Lovastatin Other (See Comments)    forgetfullness,  . Moxifloxacin     Unknown reaction  . Sulfonamide Derivatives     itching

## 2012-12-23 NOTE — Telephone Encounter (Signed)
Per [facility]-okay to refill Singulair and ok to RX Augmentin 500 mg #14 take 1 po bid no refills.

## 2013-01-09 ENCOUNTER — Ambulatory Visit: Payer: Medicare Other | Admitting: Internal Medicine

## 2013-01-12 ENCOUNTER — Telehealth: Payer: Self-pay | Admitting: Internal Medicine

## 2013-01-12 NOTE — Telephone Encounter (Signed)
I spoke with [name]. Calling to give Dr. Sabas Sous. Pt had to falls on 3/6 and 3/8. Pt is going to see Dr. Ethelene Hal today. She has some bruising to her back, pt can walk and stand. She also c/o left hip pain. Nothing further was needed. Will forward as an [place]

## 2013-01-12 NOTE — Telephone Encounter (Signed)
Noted  

## 2013-01-12 NOTE — Telephone Encounter (Signed)
[name] from Hospice called to give [name] an update on pt.  She can be reached @ [phone]. Leanora Ivanoff

## 2013-01-16 ENCOUNTER — Other Ambulatory Visit: Payer: Self-pay | Admitting: *Deleted

## 2013-01-16 MED ORDER — SOTALOL HCL 80 MG PO TABS: 80.0000 mg | ORAL_TABLET | Freq: Two times a day (BID) | ORAL | Status: AC

## 2013-01-16 NOTE — Telephone Encounter (Signed)
Fax Received. Refill Completed. Octavia Chowoe (R.M.A)   

## 2013-01-19 ENCOUNTER — Telehealth: Payer: Self-pay | Admitting: Internal Medicine

## 2013-01-19 NOTE — Telephone Encounter (Signed)
Hospice Nurse [name] is aware of [facility] recommendations.

## 2013-01-19 NOTE — Telephone Encounter (Signed)
Try otc nasal spray Nasalcrom/ Cromolyn  Try otc non-sedating antihistamine like loratadine/ Claritin.

## 2013-01-19 NOTE — Telephone Encounter (Signed)
Called, spoke with [name], hospice nurse.  Reports pt believes her seasonal allergies are acting up.  States pt feels stuffy, has head congestion, PND, and a prod cough which is "junky" per pt.  [name] hears wheezing in RML.  Denies chest tightness, chest pain, or f/c/s.  Pt is taking singulair and allergy injections.  Has not been using nasonex bc it causes nose bleeds.  Pt requesting further recs but doesn't want benadryl bc it makes her drowsy.  Dr. Maple Hudson, pls advise.  Thank you.  Last OV with [name]: 12/09/12 [facility] [address] Allergies: Allergies  Allergen Reactions  . Cephalexin     Unsure as to reaction.  ? Hives with Cephalexin; "I can take Penicillin"  . Atorvastatin     Forgetfulness   . Azithromycin Nausea And Vomiting  . Codeine Nausea And Vomiting  . Crestor (Rosuvastatin Calcium)     Forgetfulness    . Estradiol     Unknown reaction  . Hydrocortisone Hives    Flea bites were mistaken as rash from hydrocortisone  . Lovastatin Other (See Comments)    forgetfullness,  . Moxifloxacin     Unknown reaction  . Sulfonamide Derivatives     itching  . Vancomycin     Rash from cream with vancomycin

## 2013-01-23 ENCOUNTER — Ambulatory Visit (INDEPENDENT_AMBULATORY_CARE_PROVIDER_SITE_OTHER): Payer: Medicare Other | Admitting: Internal Medicine

## 2013-01-23 ENCOUNTER — Ambulatory Visit (INDEPENDENT_AMBULATORY_CARE_PROVIDER_SITE_OTHER)
Admission: RE | Admit: 2013-01-23 | Discharge: 2013-01-23 | Disposition: A | Discharge status: Home / Self Care | Payer: Medicare Other | Source: Ambulatory Visit | Attending: Internal Medicine | Admitting: Internal Medicine

## 2013-01-23 ENCOUNTER — Encounter: Payer: Self-pay | Admitting: Internal Medicine

## 2013-01-23 VITALS — BP 140/80 | HR 109 | Ht 62.0 in | Wt 126.4 lb

## 2013-01-23 DIAGNOSIS — R042 Hemoptysis: Secondary | ICD-10-CM

## 2013-01-23 DIAGNOSIS — I951 Orthostatic hypotension: Secondary | ICD-10-CM

## 2013-01-23 DIAGNOSIS — J301 Allergic rhinitis due to pollen: Secondary | ICD-10-CM

## 2013-01-23 NOTE — Patient Instructions (Addendum)
Order- CXR  Dx hemoptysis  We will increase your allergy vaccine strength to 1:10 at next order- given by Hospice Nurse  Try otc antihistamine Allegra/ fexofenadine instead of Claritin  Try sample Dymista nasal spray    1-2 puffs each nostril once daily at bedtime

## 2013-01-23 NOTE — Progress Notes (Signed)
Patient ID: [name], female    DOB: [birth year], 79 y.o.   MRN: [id number]  HPI  07/23/2011-86 yoF former smoker followed for COPD, chronic hypoxic respiratory failure, complicated by atrial fibrillation, valvular heart disease and intermittent hemoptysis. She blames rain and pollen this week for nasal congestion. Minor epistaxis despite saline spray. Short of breath with her reading but that's not really different. Coughing a little scant yellow sputum, not bad. This happened about the time she changed to generic Singulair so she's not sure if it's a medication effect but does not feel as if she has cold. Continues oxygen at 3-3.5 L.  09/25/11- 86 yoF former smoker followed for COPD, chronic hypoxic respiratory failure, complicated by atrial fibrillation, valvular heart disease and intermittent hemoptysis Post hospital- 11/10-11/16/12- she was discharged with diagnoses COPD with exacerbation, acute on chronic respiratory failure on chronic oxygen, presumed community-acquired pneumonia, acute diastolic congestive heart failure, atrial fibrillation, history of aortic valve replacement, hypertension, history of coronary artery disease. She had had some blood in her sputum again. Chest x-ray had shown previous CABG and a right chest wall pacemaker, normal heart size, peripheral opacity within the right mid lung which was new with left pleural effusion and left lower lobe airspace consolidation. The concern was for multifocal pneumonia. She still feels especially unsteady if she tries to walk with her walker. There still is some blood streak in her sputum after treatment with broad-spectrum antibiotics in hospital. She expresses strong preference for brand-name Xopenex nebulizer solution rather than generic which she does not consider is effective.   10/25/11- 89 yoF former smoker followed for COPD, chronic hypoxic respiratory failure, complicated by atrial fibrillation, valvular heart disease and  intermittent hemoptysis After last hosp, she spent 4 weeks in rehab SNF. Feels much stronger. Some edema of feet. Last CT had indicated chronic pneumonia/ infiltrates- reviewed with her.  CT chest 09/25/11- IMPRESSION:  1. There are bilateral areas of peripheral and subpleural  consolidation within the right upper lobe, right middle lobe and  posterior medial left lung base. The appearance is nonspecific  favoring inflammatory or infectious etiologies. Underlying  malignancy cannot be excluded and interval follow-up is can be  necessary to ensure complete resolution. Recommend follow-up  imaging and 3-6 weeks after appropriate antibiotic therapy. The  study of choice would be a noncontrast CT of the chest.  2. Advanced emphysema.  Original Report Authenticated By: Rosealee Albee, M.D.   12/06/11- 41 yoF former smoker followed for COPD, chronic hypoxic respiratory failure, complicated by atrial fibrillation, valvular heart disease and intermittent hemoptysis   Daughter here She has not felt particularly in the last several days. We had sent Augmentin January 29 and she has taken about 4 doses. She is blowing her nose a lot, mostly clear mucus. Cough productive of yellow sputum now turning darker. Thinks she might have had some fever this morning with no chills. Has felt weak, somewhat nauseated. Describes a sharp pain intermittently in the left lower anterolateral chest and left upper quadrant of the abdomen, related to deep breath and movement but not to meals.  12/31/11  48 yoF former smoker followed for COPD, chronic hypoxic respiratory failure, complicated by atrial fibrillation/ coumadin, valvular heart disease and intermittent hemoptysis   Daughters here Post hosp visit 12/10/11- 12/17/11- for AECOPD, Rx'd Vanc/ Zosyn and discharged on Augmentin ending 12/19/11. Denies fever now. Cough productive sticky green to yellow sputum with no blood. Denies chest pain. Doing physical therapy At [facility]  [place]. Using VEST 3x/day, Flutter device. These do help clear secretions. Continues Xopenex nebulizer An inhaler with Dulera 200 and Spiriva. She would like to continue allergy shots but is in a nursing facility now for rehabilitation after her pneumonia.  03/24/12- 86 yoF former smoker followed for COPD, chronic hypoxic respiratory failure, complicated by atrial fibrillation/ coumadin, valvular heart disease and intermittent hemoptysis   Daughters here  Pt states increased sob,wheezing, productive cough with increase activity Now working with home hospice. Alprazolam helps her sleep. Prednisone 10 mg daily maintenance helps stabilize her breathing. Medrol 4 mg was not enough. We compared methylprednisolone with prednisone and discussed long-term side effects.  She believes allergy vaccine has made a difference for her even though she is not outside very much and is now quite elderly. I am less impressed but she believes in it strongly and wants to restart. We discussed whether the hospice nurse could give her shots at home. She has brought this issue up several times.  05/27/12- 86 yoF former smoker followed for COPD, chronic hypoxic respiratory failure, complicated by atrial fibrillation/ coumadin, valvular heart disease and intermittent hemoptysis   Daughter here Post hospital followup-05/04/2012 to 05/10/2012: Summary reviewed with her. DC diagnosis community-acquired pneumonia, hemoptysis, atrial fibrillation, diastolic CHF.CAP/ pseudomonas Rxd zosyn, then 5 days of Cipro.  Pt states since coming out of hospital breathing has gotten better .some wheezing,sob.sometimes  has a productive cough .Pt feels allergy injection is helping her. Says she feels the best that she has in a long time. COPD assessment test (CAT) 31/40 CXR- 05/04/12- images reviewed IMPRESSION:  1. Bilateral airspace opacities which may represent multifocal  infection.  2. Suspect left pleural effusion.  Original Report  Authenticated By: Rosealee Albee, M.D.   07/29/12- 71 yoF former smoker followed for COPD, chronic hypoxic respiratory failure, complicated by atrial fibrillation/ coumadin, valvular heart disease and intermittent hemoptysis   Daughter here States when resting breathing is good but when active still gets SOB. Also, has dizzy spells at any given time(unknown cause); Pt states she ia hving blood in her sputum and would like to discuss VEST pressure and how often/peroids of times to use the vest. If still notices occasional bloodstained sputum and she continues Coumadin. This is not new or progressive but is "more than a little, sometimes". She stopped using the Vest device for one week and bleeding slowed but when she resumed the vibration treatments, bleeding picked up again. The device does help her clear mucus from her chest. She is back living at home again with family checking closely. COPD assessment test (CAT) score 30/40 CXR 05/28/12 IMPRESSION:  COPD. Improving bibasilar infiltrates.  Original Report Authenticated By: Camelia Phenes, M.D.    09/26/12- 25 yoF former smoker followed for COPD, chronic hypoxic respiratory failure, complicated by atrial fibrillation/ coumadin, valvular heart disease and intermittent hemoptysis   Daughter and aide are here She feels better since we increased Medrol from 8-16 mg daily. Now on her third day of this dose. She has had her flu shot. Cough always produces a little streak of blood with yellow mucus especially since she has been off of Cipro. She wears oxygen at 4 L and notices saturation dropped to 84% when she first gets into bed. Hospice is provided nebulized albuterol and she occasionally uses her rescue inhaler. She wakes up needing her nebulizer machine early in the morning. Physical activity is becoming very limited. CT chest 09/06/12- reviewed with them IMPRESSION:  1. Progressive, chronic airspace  consolidation predominately  involving the right  upper lobe. Differential includes pulmonary  neoplasm versus chronic and recurrent infection.  2. Interval development of several small pulmonary nodules in the  left lung.  3. Increase in size of mediastinal lymph nodes. The low right  paratracheal lymph node is pathologically enlarged.  4. Small bilateral pleural effusions with overlying compressive  type atelectasis and consolidation.  Original Report Authenticated By: Signa Kell, M.D.   11/13/12- 74 yoF former smoker followed for COPD, chronic hypoxic respiratory failure, complicated by atrial fibrillation/ coumadin, valvular heart disease and intermittent hemoptysis   Daughter and aide are here FOLLOWS FOR: harder to breathe in mornings; waking up in middle of night with blood in mouth. Needs to have order sent for [name](hospice nurse) to check Pt/INR. She feels very weak in the mornings, better after lunch. Breo did not help. She has been off of Coumadin since January 3 but continues hemoptysis. She had acute vertebral compression fracture with pain. Low back pain has faded. I went over sequential imaging with her and her daughter again. Persistent and somewhat progressive density in the mid chest identified as right upper lobe. We had treated it optimistically as a pneumonia because she was too frail to entertain aggressive handling or treatment or malignant. With continued significant hemoptysis demonstrated, I laid out options. She says she wants to know what this is even if it is a cancer. She and her daughter indicated they were satisfied with the way we have handled this problem.  CXR 10/22/12 IMPRESSION:  1. Persistent but improving opacity in the inferior right upper  lobe compared to 09/01/2012 and 09/06/2012.  2. Unchanged background of COPD, fibrosis and emphysema  3. Stable cardiomegaly and aortic atherosclerosis status post  aortic valve replacement.  Original Report Authenticated By: Malachy Moan, M.D.  Thoracic spine  10/24/12 IMPRESSION:  Compression fracture T12 which is new and appears acute.  Severe COPD. Infiltrate or mass in the right midlung is unchanged.  This could represent carcinoma of the lung.  Original Report Authenticated By: Janeece Riggers, M.D.   12/09/12   35 yoF former smoker followed for COPD, chronic hypoxic respiratory failure, complicated by atrial fibrillation/ coumadin, valvular heart disease and intermittent hemoptysis    Daughter here. FOLLOWS FOR: patient has cut on leg from wheelchair and would like to have checked out; Increased SOB since last visit-tires her out and low O2 levels with even washing her face Depressed and discouraged.  Back pain consistent with known T12 compression fx. Pain right side which may be radicular nerve root irritation, but could also be from the pulmonary density R lung or something else. Coughing up less blood, mostly now old brown as she remains off warfarin. Using scheduled Vicodin for the pain, but it constipates. O2 sat mid 90%s on 3L at rest, but desats fast on room air or any exertion. Uses 4-5 L at home.  01/23/13- 4 yoF former smoker followed for COPD, chronic hypoxic respiratory failure, complicated by atrial fibrillation/ coumadin, valvular heart disease and intermittent hemoptysis    Daughter here FOLLOWS FOR: coughing up blood about 3 times since last visit-bright red to brown(twice its been black in color when able to get phelgm up). Would go few days before it happens again Hemoptysis had stopped for a few weeks but started again last week and is slowing now to old blood. Occasional cough trying to clear her throat. Has fallen twice, hurting knee and left hip. She says she stood up too  quickly. Using Voltaren ointment. Discussed going back to Dr.Ramos. Continues allergy vaccine 1:50 given by hospice nurse, because Mrs. Heacox actively wants to continue.  ROS-see HPI Constitutional:   No-   weight loss, night sweats,  fevers, chills,   +fatigue, lassitude. HEENT:   No-  headaches, difficulty swallowing, tooth/dental problems, sore throat, +episodic epistaxis.      No-  sneezing, itching, ear ache,   +nasal congestion, post nasal drip,  CV:  No- R chest pain,  No-orthopnea, PND, swelling in lower extremities, anasarca, +dizziness, palpitations Resp: + shortness of breath with exertion or at rest.              + productive cough,  No non-productive cough,  + coughing up of blood.              No- change in color of mucus.  No- wheezing.   Skin: No-   rash or lesions. GI:  No-   heartburn, indigestion, abdominal pain, nausea, vomiting, +constipation GU:  MS:  No- acute  joint pain or swelling.  + back pain Neuro-     nothing new Psych:  . + depression or anxiety.  No memory loss.  Objective:   BP 140/80  Pulse 109  Ht 5\' 2"  (1.575 m)  Wt 126 lb 6.4 oz (57.335 kg)  BMI 23.11 kg/m2  SpO2 97% General- Alert, Oriented, Affect-depressed, Distress- none acute   O2 4L pulse , wheelchair. Talkative.  Skin- +Thin with steroid echymoses Lymphadenopathy- none Head- atraumatic            Eyes- Gross vision intact, PERRLA, conjunctivae clear secretions            Ears- Hearing, canals-normal for age            Nose- Clear, no-Septal dev, mucus, polyps, erosion, perforation.              Throat- Mallampati II-III , mucosa clear , drainage- none, tonsils- atrophic Neck- flexible , trachea midline, no stridor , thyroid nl, carotid no bruit Chest - symmetrical excursion , unlabored           Heart/CV- IRR few skips , +2-3/6 systolic LSB murmur , no gallop  , no rub, nl s1 s2                           - JVD- 1cm , edema-+1 R>L, stasis changes- none, varices- none           Lung-  dullness-none, rub- none,  +Bilateral  rhonchi. +rough cough, + distant wheeze.            Chest wall- R Pacemaker. Abd-  Br/ Gen/ Rectal- Not done, not indicated Extrem- Thin, clean-appearing laceration R post calf, covered with Tegaderm.  Neuro-  grossly intact to observation

## 2013-01-28 ENCOUNTER — Telehealth: Payer: Self-pay | Admitting: Internal Medicine

## 2013-01-28 NOTE — Progress Notes (Signed)
Quick Note:  Kathy-pt's daughter is aware of results. Will send to Dr Ethelene Hal as Lorain Childes as well. ______

## 2013-01-28 NOTE — Telephone Encounter (Signed)
Result Note    The right lung shadow may be slightly bigger than in November, but not much changed.      There is a new vertebral fracture one level lower than the last one. She may need Dr Ethelene Hal to help with this   Spoke with [facility] of results. States patient went to see Dr Ethelene Hal yesterday and no troubles with legs but will get her last shot at a later date.

## 2013-01-30 ENCOUNTER — Other Ambulatory Visit: Payer: Self-pay | Admitting: *Deleted

## 2013-01-30 MED ORDER — FUROSEMIDE 40 MG PO TABS: ORAL_TABLET | ORAL | Status: DC

## 2013-01-30 NOTE — Telephone Encounter (Signed)
Fax Received. Refill Completed. [name] (R.M.A)   

## 2013-01-30 NOTE — Assessment & Plan Note (Addendum)
We can increase back to 1:10 againnext order. She insists that she gets outdoors in good weather, enough to matter. She gets some emotional reassurance from the allergy shots Plan-advanced to 1:10, try Allegra, try sample Dymista nasal spray

## 2013-01-30 NOTE — Assessment & Plan Note (Signed)
She will discuss this with Dr. Elease Hashimoto for consideration of her blood pressure meds

## 2013-02-02 ENCOUNTER — Encounter: Payer: Self-pay | Admitting: Cardiology

## 2013-02-03 ENCOUNTER — Encounter: Payer: Self-pay | Admitting: Cardiovascular Disease

## 2013-02-03 ENCOUNTER — Ambulatory Visit (INDEPENDENT_AMBULATORY_CARE_PROVIDER_SITE_OTHER): Payer: Medicare Other | Admitting: Cardiovascular Disease

## 2013-02-03 VITALS — BP 126/64 | HR 63 | Ht 62.0 in | Wt 122.0 lb

## 2013-02-03 DIAGNOSIS — I951 Orthostatic hypotension: Secondary | ICD-10-CM

## 2013-02-03 DIAGNOSIS — I350 Nonrheumatic aortic (valve) stenosis: Secondary | ICD-10-CM

## 2013-02-03 DIAGNOSIS — I359 Nonrheumatic aortic valve disorder, unspecified: Secondary | ICD-10-CM

## 2013-02-03 MED ORDER — DILTIAZEM HCL ER COATED BEADS 120 MG PO CP24: 120.0000 mg | ORAL_CAPSULE | Freq: Every day | ORAL | Status: DC

## 2013-02-03 NOTE — Patient Instructions (Addendum)
Your physician wants you to follow-up in: 3 MONTHS WITH EKG  You will receive a reminder letter in the mail two months in advance. If you don't receive a letter, please call our office to schedule the follow-up appointment.  Your physician has recommended you make the following change in your medication:   DECREASE CARDIZEM TO 120 MG FROM 240 MG,

## 2013-02-03 NOTE — Assessment & Plan Note (Addendum)
She has had several episodes of orthostatic hypotension.  She is on diltiazem 240, Lasix 40 in AM, 20 in PM, and sotolol 80 BID.  I think the most likely cause of her orthostasis is the  Lasix  But I dont think she will tolerate stopping the Lasix.  We will decrease the Diltiazem to 120 a day and see if that helps.  Will see her in 3 months.

## 2013-02-03 NOTE — Assessment & Plan Note (Addendum)
Ms. Collymore 's aortic stenosis is stable. Continue with medical therapy. She is not a candidate for redo aVR.

## 2013-02-03 NOTE — Progress Notes (Signed)
[name] Date of Birth  1924-02-19       [facility]    [place] [address]  [address] [place], [place]  [address]   [place], [place]  [address] [phone]     [phone]   Fax  [phone]    Fax [phone]  Problem List: 1. Severe COPD 2. Status post aortic valve replacement-residual gradient of 30 mmHg 3. History of atrial flutter with chronic anticoagulation 4. Dual-chamber pacemaker 5. Mild coronary artery disease by heart catheterization in 2006  History of Present Illness:  [name] has had problems with COPD / pneumonia x 3.  She has history of aortic stenosis but her main limitation seems to be COPD. The hospice nurse visits her on a regular basis.  She has had a rough time since I last saw her - frequent episodes of pneumonia.  She  Is walking around the house on occasion.  She is still weak due to her frequent episodes of pneumonia.  She has severe COPD.  February 03, 2013:  [name] has had some dizziness - 2 occasions of lightheadedness/ orthostatic hypotension. .  She has fallen twice over the past several weeks.  Her symptoms are c/w orthostasis.  Current Outpatient Prescriptions on File Prior to Visit  Medication Sig Dispense Refill  . albuterol (PROVENTIL) (2.5 MG/3ML) 0.083% nebulizer solution Take 3 mLs (2.5 mg total) by nebulization every 6 (six) hours as needed for wheezing or shortness of breath.  150 mL  12  . ALPRAZolam (XANAX) 0.25 MG tablet Take 0.25 mg by mouth daily. For anxiety (0.5 Tablets PRN)      . Alum & Mag Hydroxide-Simeth (MAGIC MOUTHWASH) SOLN For mouth irritationsTake 5 mLs by mouth 4 (four) times daily as needed. For mouth irritations  120 mL  0  . Calcium Citrate-Vitamin D (CALCIUM CITRATE + D PO) Take 1 tablet by mouth daily.      . digoxin (LANOXIN) 0.125 MG tablet Take 1 tablet (125 mcg total) by mouth every evening.  30 tablet  6  . diltiazem (CARDIZEM CD) 240 MG 24  hr capsule Take 1 capsule (240 mg total) by mouth daily.  30 capsule  11  . EPINEPHrine (EPIPEN JR) 0.15 MG/0.3ML injection Inject 0.3 mLs (0.15 mg total) into the muscle as needed for anaphylaxis.  1 each  prn  . ezetimibe (ZETIA) 10 MG tablet Take 1 tablet (10 mg total) by mouth every evening.  30 tablet  12  . ferrous sulfate 325 (65 FE) MG tablet Take 325 mg by mouth daily with breakfast.      . furosemide (LASIX) 40 MG tablet Take 1 tablet every morning and 1/2 tablet every evening  45 tablet  4  . Guaifenesin 1200 MG TB12 Take 600 mg by mouth 2 (two) times daily.       Marland Kitchen levalbuterol (XOPENEX HFA) 45 MCG/ACT inhaler Inhale 2 puffs into the lungs every 4 (four) hours as needed for wheezing or shortness of breath. For shortness of breath  1 Inhaler  0  . methylPREDNISolone (MEDROL) 8 MG tablet Take 2 tablets (16 mg total) by mouth daily.  60 tablet  6  . metoCLOPramide (REGLAN) 5 MG tablet Take 1 tablet by mouth 4 (four) times daily -  with meals and at bedtime.      . Mometasone Furo-Formoterol Fum (DULERA) 200-5 MCG/ACT AERO Inhale 2 puffs into the lungs 2 (two) times daily as  needed.  13 g  6  . montelukast (SINGULAIR) 10 MG tablet TAKE 1 TABLET BY MOUTH EVERY DAY  30 tablet  5  . NEXIUM 40 MG capsule TAKE 1 TABLET BY MOUTH ONCE A DAY  90 capsule  1  . Omega-3 Fatty Acids (FISH OIL) 1200 MG CAPS Take 1 capsule by mouth daily.      . polyethylene glycol (MIRALAX / GLYCOLAX) packet Take 17 g by mouth daily.      . potassium chloride SA (K-DUR,KLOR-CON) 20 MEQ tablet Take 1 tablet (20 mEq total) by mouth 2 (two) times daily.  60 tablet  11  . pyridoxine (B-6) 200 MG tablet Take 200 mg by mouth daily.      Marland Kitchen senna (SENOKOT) 8.6 MG tablet 2 every morning and 3 every evening      . sertraline (ZOLOFT) 50 MG tablet Take 1 tablet (50 mg total) by mouth daily.  30 tablet  3  . sotalol (BETAPACE) 80 MG tablet Take 1 tablet (80 mg total) by mouth 2 (two) times daily.  60 tablet  5  . tiotropium  (SPIRIVA) 18 MCG inhalation capsule Place 1 capsule (18 mcg total) into inhaler and inhale daily.  30 capsule  6  . vitamin C (ASCORBIC ACID) 500 MG tablet Take 500 mg by mouth daily.       No current facility-administered medications on file prior to visit.    Allergies  Allergen Reactions  . Cephalexin     Unsure as to reaction.  ? Hives with Cephalexin; "I can take Penicillin"  . Atorvastatin     Forgetfulness   . Azithromycin Nausea And Vomiting  . Codeine Nausea And Vomiting  . Crestor (Rosuvastatin Calcium)     Forgetfulness    . Estradiol     Unknown reaction  . Hydrocodone-Acetaminophen     Not allergic or intolerant-just watchful of medication  . Hydrocortisone Hives    Flea bites were mistaken as rash from hydrocortisone  . Lovastatin Other (See Comments)    forgetfullness,  . Moxifloxacin     Unknown reaction  . Sulfonamide Derivatives     itching  . Vancomycin     Rash from cream with vancomycin    Past Medical History  Diagnosis Date  . Asthmatic bronchitis   . Allergic rhinitis   . Atrial fibrillation   . History of aortic valve replacement Dr. Tyrone Sage 2006    Has severe residual gradient and will be managed conservatively  . Sleep apnea   . Chronic obstructive asthma   . Osteoporosis   . Pruritic disorder   . Diverticulosis   . Hemorrhoids   . GERD (gastroesophageal reflux disease)   . IBS (irritable bowel syndrome)   . Pacemaker 2010  . Oxygen dependent   . Atrial flutter     on Betapace  . High risk medication use     on Betapace & Coumadin  . Chronic anticoagulation   . COPD (chronic obstructive pulmonary disease)   . Pneumonia   . Anemia   . Arthritis   . Hypertension   . Coronary artery disease   . Pneumonia 09/2011    X 10 Sep 2011- June  2013  . Complication of anesthesia     trouble waking up  . CHF (congestive heart failure)     Past Surgical History  Procedure Laterality Date  . Back surgery  1994  . Tonsillectomy     . Appendectomy    .  Ganglion cyst excision    . Vocal cord polyps  1975 and 1984  . Aortic valve replacement  2006    Bioprosthetic  . Pacemaker insertion  2010  . Cardiac electrophysiology mapping and ablation    . Coronary artery bypass graft    . Insert / replace / remove pacemaker    . Eye surgery    . Cardiac catheterization    . Coronary angioplasty    . Cardiac valve replacement      History  Smoking status  . Former Smoker -- 1.00 packs/day for 50 years  . Types: Cigarettes  . Quit date: 11/05/1993  Smokeless tobacco  . Never Used    History  Alcohol Use No    Family History  Problem Relation Age of Onset  . Kidney failure Father   . Colon cancer Paternal Aunt   . Heart disease Maternal Grandfather   . Heart attack Paternal Grandfather   . Breast cancer Daughter   . Stroke Maternal Grandmother   . Heart disease Mother     Reviw of Systems:  Reviewed in the HPI.  All other systems are negative.  Physical Exam: Blood pressure 126/64, pulse 63, height 5\' 2"  (1.575 m), weight 122 lb (55.339 kg), SpO2 95.00%. General: Well developed, well nourished, in no acute distress.  Head: Normocephalic, atraumatic, sclera non-icteric, mucus membranes are moist,   Neck: Supple. Carotids are 2 + without bruits. No JVD  Lungs: she has decreased breath sounds bilaterally  Heart: regular rate.  normal  S1 S2. There is a soft systolic murmur.  Abdomen: Soft, non-tender, non-distended with normal bowel sounds. No hepatomegaly. No rebound/guarding. No masses.  Msk:  Strength and tone are normal  Extremities: No clubbing or cyanosis. No edema.  Distal pedal pulses are 2+ and equal bilaterally.  Neuro: Alert and oriented X 3. Moves all extremities spontaneously.  Psych:  Responds to questions appropriately with a normal affect.  ECG:   Assessment / Plan:

## 2013-02-04 ENCOUNTER — Telehealth: Payer: Self-pay | Admitting: Internal Medicine

## 2013-02-04 MED ORDER — AZELASTINE-FLUTICASONE 137-50 MCG/ACT NA SUSP: 2.0000 | Freq: Every day | NASAL | Status: AC

## 2013-02-04 NOTE — Telephone Encounter (Signed)
Rx has been sent in. Pt is aware. 

## 2013-02-09 ENCOUNTER — Ambulatory Visit (INDEPENDENT_AMBULATORY_CARE_PROVIDER_SITE_OTHER): Payer: Medicare Other | Admitting: *Deleted

## 2013-02-09 DIAGNOSIS — I4891 Unspecified atrial fibrillation: Secondary | ICD-10-CM

## 2013-02-09 DIAGNOSIS — Z950 Presence of cardiac pacemaker: Secondary | ICD-10-CM

## 2013-02-12 ENCOUNTER — Other Ambulatory Visit: Payer: Self-pay

## 2013-02-12 ENCOUNTER — Encounter: Payer: Self-pay | Admitting: Internal Medicine

## 2013-02-13 ENCOUNTER — Telehealth: Payer: Self-pay | Admitting: Internal Medicine

## 2013-02-13 LAB — REMOTE PACEMAKER DEVICE
AL IMPEDENCE PM: 552 Ohm
ATRIAL PACING PM: 21.89
BAMS-0001: 170 {beats}/min
BATTERY VOLTAGE: 2.97 V

## 2013-02-13 NOTE — Telephone Encounter (Signed)
[name] has filled out and singed form for [employer]. Neysa Bonito (pts daughter) is aware I have faxed back to [phone].

## 2013-02-13 NOTE — Telephone Encounter (Signed)
Spoke with [employer] for [employer] form has been received and given to Katie to have CY sign Christy requesting form to be faxed back to highlighted number on top of form. Will send to [facility] as reminder. Thanks!!!

## 2013-02-13 NOTE — Telephone Encounter (Signed)
Form on CY's cart to fill out. Will fax back as soon as possible.

## 2013-02-17 ENCOUNTER — Ambulatory Visit (INDEPENDENT_AMBULATORY_CARE_PROVIDER_SITE_OTHER): Payer: Medicare Other

## 2013-02-17 DIAGNOSIS — J309 Allergic rhinitis, unspecified: Secondary | ICD-10-CM

## 2013-03-05 ENCOUNTER — Encounter: Payer: Self-pay | Admitting: *Deleted

## 2013-03-09 ENCOUNTER — Telehealth: Payer: Self-pay | Admitting: Internal Medicine

## 2013-03-09 ENCOUNTER — Telehealth: Payer: Self-pay | Admitting: Cardiovascular Disease

## 2013-03-09 MED ORDER — DIGOXIN 125 MCG PO TABS: 125.0000 ug | ORAL_TABLET | Freq: Every evening | ORAL | Status: AC

## 2013-03-09 NOTE — Telephone Encounter (Signed)
[email or web address].Jennifer Castillo, CMA  

## 2013-03-09 NOTE — Telephone Encounter (Signed)
Per CY-if Dymista was not okay at 1 puff each nostril once daily then try Zrytec.

## 2013-03-09 NOTE — Telephone Encounter (Signed)
wendy aware of recs. Nothing further was needed

## 2013-03-09 NOTE — Telephone Encounter (Signed)
New problem   Pt's daughter need advise about pt diltiazem dosage. Please call daughter

## 2013-03-09 NOTE — Telephone Encounter (Signed)
I spoke with [name] and she states pt is having increase sinus drainage, waking up choking on the drainage at night. She is taking benadryl w/o relief. They are increasing her allergy serum every week for the past month. This week she is at .4. Please advise Dr. Maple Hudson thanks Last OV 01/23/13 Pending 03/31/13 Allergies  Allergen Reactions  . Cephalexin     Unsure as to reaction.  ? Hives with Cephalexin; "I can take Penicillin"  . Atorvastatin     Forgetfulness   . Azithromycin Nausea And Vomiting  . Codeine Nausea And Vomiting  . Crestor (Rosuvastatin Calcium)     Forgetfulness    . Estradiol     Unknown reaction  . Hydrocodone-Acetaminophen     Not allergic or intolerant-just watchful of medication  . Hydrocortisone Hives    Flea bites were mistaken as rash from hydrocortisone  . Lovastatin Other (See Comments)    forgetfullness,  . Moxifloxacin     Unknown reaction  . Sulfonamide Derivatives     itching  . Vancomycin     Rash from cream with vancomycin

## 2013-03-09 NOTE — Telephone Encounter (Signed)
Spoke with daughter, pt has reduced her Diltiazem to 120 mg as mg  from 240 mg. 02/03/13 for hypotension bp 128/62, 112/50, 106/52. 112/58  Per Daughter you had said she may be able to come off med completely.  Do you want pt to stop diltiazem? Please advise.

## 2013-03-10 ENCOUNTER — Telehealth: Payer: Self-pay | Admitting: Internal Medicine

## 2013-03-10 MED ORDER — AMOXICILLIN-POT CLAVULANATE 500-125 MG PO TABS: 1.0000 | ORAL_TABLET | Freq: Two times a day (BID) | ORAL | Status: AC

## 2013-03-10 NOTE — Telephone Encounter (Signed)
Pt is aware of CY recommendation. Rx has been sent in. Nothing further was needed.

## 2013-03-10 NOTE — Telephone Encounter (Signed)
Spoke to [place]. States that pt has had SOB since yesterday afternoon. Her O2 sat is 98%. Has been producing green sputum for about 2-3 days. According to the pt, she has been running a fever. Her "normal" temp is in the 97's, her temp was 98.5 today. Pt is wanting CY recommendations.  CY - please advise. Thanks.  Allergies  Allergen Reactions  . Cephalexin     Unsure as to reaction.  ? Hives with Cephalexin; "I can take Penicillin"  . Atorvastatin     Forgetfulness   . Azithromycin Nausea And Vomiting  . Codeine Nausea And Vomiting  . Crestor (Rosuvastatin Calcium)     Forgetfulness    . Estradiol     Unknown reaction  . Hydrocodone-Acetaminophen     Not allergic or intolerant-just watchful of medication  . Hydrocortisone Hives    Flea bites were mistaken as rash from hydrocortisone  . Lovastatin Other (See Comments)    forgetfullness,  . Moxifloxacin     Unknown reaction  . Sulfonamide Derivatives     itching  . Vancomycin     Rash from cream with vancomycin

## 2013-03-10 NOTE — Telephone Encounter (Signed)
Daughter was updated and verbalized understanding. MAR adjusted.

## 2013-03-10 NOTE — Telephone Encounter (Signed)
I think that she should discontinue her diltiazem completely at this time.

## 2013-03-10 NOTE — Telephone Encounter (Signed)
Per [name]: augmentin 500 1 BID #20

## 2013-03-10 NOTE — Addendum Note (Signed)
Addended by: Antony Odea on: 03/10/2013 03:51 PM   Modules accepted: Orders, Medications

## 2013-03-11 ENCOUNTER — Telehealth: Payer: Self-pay | Admitting: Internal Medicine

## 2013-03-11 ENCOUNTER — Emergency Department (HOSPITAL_COMMUNITY)

## 2013-03-11 ENCOUNTER — Inpatient Hospital Stay (HOSPITAL_COMMUNITY)
Admission: EM | Admit: 2013-03-11 | Discharge: 2013-04-05 | DRG: 190 | Disposition: E | Attending: Internal Medicine | Admitting: Internal Medicine

## 2013-03-11 ENCOUNTER — Encounter (HOSPITAL_COMMUNITY): Payer: Self-pay

## 2013-03-11 DIAGNOSIS — D72829 Elevated white blood cell count, unspecified: Secondary | ICD-10-CM | POA: Diagnosis present

## 2013-03-11 DIAGNOSIS — Z954 Presence of other heart-valve replacement: Secondary | ICD-10-CM

## 2013-03-11 DIAGNOSIS — J151 Pneumonia due to Pseudomonas: Secondary | ICD-10-CM | POA: Diagnosis present

## 2013-03-11 DIAGNOSIS — I509 Heart failure, unspecified: Secondary | ICD-10-CM | POA: Diagnosis present

## 2013-03-11 DIAGNOSIS — E871 Hypo-osmolality and hyponatremia: Secondary | ICD-10-CM | POA: Diagnosis present

## 2013-03-11 DIAGNOSIS — Z66 Do not resuscitate: Secondary | ICD-10-CM | POA: Diagnosis present

## 2013-03-11 DIAGNOSIS — I350 Nonrheumatic aortic (valve) stenosis: Secondary | ICD-10-CM | POA: Diagnosis present

## 2013-03-11 DIAGNOSIS — Z79899 Other long term (current) drug therapy: Secondary | ICD-10-CM

## 2013-03-11 DIAGNOSIS — E875 Hyperkalemia: Secondary | ICD-10-CM | POA: Diagnosis not present

## 2013-03-11 DIAGNOSIS — F411 Generalized anxiety disorder: Secondary | ICD-10-CM | POA: Diagnosis present

## 2013-03-11 DIAGNOSIS — I2510 Atherosclerotic heart disease of native coronary artery without angina pectoris: Secondary | ICD-10-CM | POA: Diagnosis present

## 2013-03-11 DIAGNOSIS — J449 Chronic obstructive pulmonary disease, unspecified: Secondary | ICD-10-CM

## 2013-03-11 DIAGNOSIS — Z951 Presence of aortocoronary bypass graft: Secondary | ICD-10-CM

## 2013-03-11 DIAGNOSIS — Z515 Encounter for palliative care: Secondary | ICD-10-CM

## 2013-03-11 DIAGNOSIS — Z950 Presence of cardiac pacemaker: Secondary | ICD-10-CM

## 2013-03-11 DIAGNOSIS — F329 Major depressive disorder, single episode, unspecified: Secondary | ICD-10-CM | POA: Diagnosis present

## 2013-03-11 DIAGNOSIS — Z7901 Long term (current) use of anticoagulants: Secondary | ICD-10-CM

## 2013-03-11 DIAGNOSIS — R739 Hyperglycemia, unspecified: Secondary | ICD-10-CM | POA: Diagnosis present

## 2013-03-11 DIAGNOSIS — J189 Pneumonia, unspecified organism: Secondary | ICD-10-CM | POA: Diagnosis present

## 2013-03-11 DIAGNOSIS — R7309 Other abnormal glucose: Secondary | ICD-10-CM | POA: Diagnosis not present

## 2013-03-11 DIAGNOSIS — J9620 Acute and chronic respiratory failure, unspecified whether with hypoxia or hypercapnia: Secondary | ICD-10-CM | POA: Diagnosis present

## 2013-03-11 DIAGNOSIS — I5033 Acute on chronic diastolic (congestive) heart failure: Secondary | ICD-10-CM | POA: Diagnosis present

## 2013-03-11 DIAGNOSIS — E876 Hypokalemia: Secondary | ICD-10-CM | POA: Diagnosis not present

## 2013-03-11 DIAGNOSIS — J441 Chronic obstructive pulmonary disease with (acute) exacerbation: Principal | ICD-10-CM | POA: Diagnosis present

## 2013-03-11 DIAGNOSIS — I359 Nonrheumatic aortic valve disorder, unspecified: Secondary | ICD-10-CM | POA: Diagnosis present

## 2013-03-11 DIAGNOSIS — T380X5A Adverse effect of glucocorticoids and synthetic analogues, initial encounter: Secondary | ICD-10-CM | POA: Diagnosis not present

## 2013-03-11 DIAGNOSIS — K219 Gastro-esophageal reflux disease without esophagitis: Secondary | ICD-10-CM | POA: Diagnosis present

## 2013-03-11 DIAGNOSIS — F3289 Other specified depressive episodes: Secondary | ICD-10-CM | POA: Diagnosis present

## 2013-03-11 DIAGNOSIS — I10 Essential (primary) hypertension: Secondary | ICD-10-CM | POA: Diagnosis present

## 2013-03-11 DIAGNOSIS — E785 Hyperlipidemia, unspecified: Secondary | ICD-10-CM | POA: Diagnosis present

## 2013-03-11 DIAGNOSIS — I4891 Unspecified atrial fibrillation: Secondary | ICD-10-CM | POA: Diagnosis present

## 2013-03-11 HISTORY — DX: Major depressive disorder, single episode, unspecified: F32.9

## 2013-03-11 HISTORY — DX: Anxiety disorder, unspecified: F41.9

## 2013-03-11 HISTORY — DX: Encounter for palliative care: Z51.5

## 2013-03-11 LAB — BASIC METABOLIC PANEL
BUN: 23 mg/dL (ref 6–23)
CO2: 33 mEq/L — ABNORMAL HIGH (ref 19–32)
Calcium: 9.5 mg/dL (ref 8.4–10.5)
Chloride: 84 mEq/L — ABNORMAL LOW (ref 96–112)
Creatinine, Ser: 0.64 mg/dL (ref 0.50–1.10)
GFR calc Af Amer: >90 mL/min (ref >90)
GFR calc non Af Amer: 79 mL/min — ABNORMAL LOW (ref >90)
Glucose, Bld: 167 mg/dL — ABNORMAL HIGH (ref 70–99)
Potassium: 4.5 mEq/L (ref 3.5–5.1)
Sodium: 126 mEq/L — ABNORMAL LOW (ref 135–145)

## 2013-03-11 LAB — CBC WITH DIFFERENTIAL/PLATELET
Basophils Absolute: 0 10*3/uL (ref 0.0–0.1)
Basophils Relative: 0 % (ref 0–1)
Eosinophils Absolute: 0 10*3/uL (ref 0.0–0.7)
Eosinophils Relative: 0 % (ref 0–5)
HCT: 37.6 % (ref 36.0–46.0)
Hemoglobin: 12.2 g/dL (ref 12.0–15.0)
Lymphocytes Relative: 3 % — ABNORMAL LOW (ref 12–46)
Lymphs Abs: 0.8 10*3/uL (ref 0.7–4.0)
MCH: 27.6 pg (ref 26.0–34.0)
MCHC: 32.4 g/dL (ref 30.0–36.0)
MCV: 85.1 fL (ref 78.0–100.0)
Monocytes Absolute: 1.3 10*3/uL — ABNORMAL HIGH (ref 0.1–1.0)
Monocytes Relative: 5 % (ref 3–12)
Neutro Abs: 24.3 10*3/uL — ABNORMAL HIGH (ref 1.7–7.7)
Neutrophils Relative %: 92 % — ABNORMAL HIGH (ref 43–77)
Platelets: 236 10*3/uL (ref 150–400)
RBC: 4.42 MIL/uL (ref 3.87–5.11)
RDW: 15 % (ref 11.5–15.5)
WBC: 26.4 10*3/uL — ABNORMAL HIGH (ref 4.0–10.5)

## 2013-03-11 LAB — URINALYSIS, ROUTINE W REFLEX MICROSCOPIC
Bilirubin Urine: NEGATIVE
Glucose, UA: NEGATIVE mg/dL
Hgb urine dipstick: NEGATIVE
Ketones, ur: NEGATIVE mg/dL
Leukocytes, UA: NEGATIVE
Nitrite: NEGATIVE
Protein, ur: 30 mg/dL — AB
Specific Gravity, Urine: 1.019 (ref 1.005–1.030)
Urobilinogen, UA: 0.2 mg/dL (ref 0.0–1.0)
pH: 5.5 (ref 5.0–8.0)

## 2013-03-11 LAB — PRO B NATRIURETIC PEPTIDE: Pro B Natriuretic peptide (BNP): 687.7 pg/mL — ABNORMAL HIGH (ref 0–450)

## 2013-03-11 LAB — URINE MICROSCOPIC-ADD ON

## 2013-03-11 MED ORDER — DIGOXIN 125 MCG PO TABS
125.0000 ug | ORAL_TABLET | Freq: Every day | ORAL | Status: DC
  Administered 2013-03-11 – 2013-03-15 (×4): 125 ug via ORAL
  Filled 2013-03-11 (×7): qty 1

## 2013-03-11 MED ORDER — ALPRAZOLAM 0.25 MG PO TABS
0.2500 mg | ORAL_TABLET | Freq: Every day | ORAL | Status: DC
  Administered 2013-03-12 – 2013-03-13 (×2): 0.25 mg via ORAL
  Filled 2013-03-11: qty 1
  Filled 2013-03-11: qty 2
  Filled 2013-03-11 (×2): qty 1

## 2013-03-11 MED ORDER — PANTOPRAZOLE SODIUM 40 MG PO TBEC
80.0000 mg | DELAYED_RELEASE_TABLET | Freq: Every day | ORAL | Status: DC
  Administered 2013-03-12 – 2013-03-14 (×3): 80 mg via ORAL
  Filled 2013-03-11 (×4): qty 2

## 2013-03-11 MED ORDER — ONDANSETRON HCL 4 MG/2ML IJ SOLN: 4.0000 mg | Freq: Four times a day (QID) | INTRAMUSCULAR | Status: DC | PRN

## 2013-03-11 MED ORDER — SOTALOL HCL 80 MG PO TABS
80.0000 mg | ORAL_TABLET | Freq: Two times a day (BID) | ORAL | Status: DC
  Administered 2013-03-11 – 2013-03-16 (×8): 80 mg via ORAL
  Filled 2013-03-11 (×13): qty 1

## 2013-03-11 MED ORDER — LEVALBUTEROL HCL 1.25 MG/0.5ML IN NEBU
1.2500 mg | INHALATION_SOLUTION | RESPIRATORY_TRACT | Status: DC | PRN
  Administered 2013-03-11 – 2013-03-13 (×4): 1.25 mg via RESPIRATORY_TRACT
  Filled 2013-03-11 (×2): qty 0.5

## 2013-03-11 MED ORDER — DEXTROSE 5 % IV SOLN
500.0000 mg | INTRAVENOUS | Status: DC
  Administered 2013-03-11 – 2013-03-12 (×2): 500 mg via INTRAVENOUS
  Filled 2013-03-11 (×2): qty 500

## 2013-03-11 MED ORDER — AZELASTINE HCL 0.1 % NA SOLN
2.0000 | Freq: Every day | NASAL | Status: DC
  Administered 2013-03-11 – 2013-03-14 (×3): 2 via NASAL
  Filled 2013-03-11: qty 30

## 2013-03-11 MED ORDER — ALBUTEROL (5 MG/ML) CONTINUOUS INHALATION SOLN: 10.0000 mg/h | INHALATION_SOLUTION | RESPIRATORY_TRACT | Status: AC

## 2013-03-11 MED ORDER — IPRATROPIUM BROMIDE 0.02 % IN SOLN
0.5000 mg | RESPIRATORY_TRACT | Status: DC | PRN
  Administered 2013-03-11: 0.5 mg via RESPIRATORY_TRACT
  Filled 2013-03-11: qty 2.5

## 2013-03-11 MED ORDER — FLUTICASONE PROPIONATE 50 MCG/ACT NA SUSP
2.0000 | Freq: Every day | NASAL | Status: DC
  Administered 2013-03-11 – 2013-03-14 (×3): 2 via NASAL
  Filled 2013-03-11: qty 16

## 2013-03-11 MED ORDER — FERROUS SULFATE 325 (65 FE) MG PO TABS
325.0000 mg | ORAL_TABLET | Freq: Every day | ORAL | Status: DC
  Administered 2013-03-12 – 2013-03-13 (×2): 325 mg via ORAL
  Filled 2013-03-11 (×5): qty 1

## 2013-03-11 MED ORDER — FUROSEMIDE 40 MG PO TABS
40.0000 mg | ORAL_TABLET | Freq: Two times a day (BID) | ORAL | Status: DC
  Administered 2013-03-11 – 2013-03-14 (×6): 40 mg via ORAL
  Filled 2013-03-11 (×10): qty 1

## 2013-03-11 MED ORDER — METHYLPREDNISOLONE SODIUM SUCC 125 MG IJ SOLR
60.0000 mg | Freq: Three times a day (TID) | INTRAMUSCULAR | Status: DC
  Administered 2013-03-11 – 2013-03-16 (×14): 60 mg via INTRAVENOUS
  Filled 2013-03-11 (×18): qty 0.96

## 2013-03-11 MED ORDER — MORPHINE SULFATE 2 MG/ML IJ SOLN
1.0000 mg | INTRAMUSCULAR | Status: DC | PRN
  Administered 2013-03-13 – 2013-03-17 (×11): 1 mg via INTRAVENOUS
  Filled 2013-03-11 (×12): qty 1

## 2013-03-11 MED ORDER — EZETIMIBE 10 MG PO TABS
10.0000 mg | ORAL_TABLET | Freq: Every day | ORAL | Status: DC
  Administered 2013-03-11 – 2013-03-14 (×3): 10 mg via ORAL
  Filled 2013-03-11 (×5): qty 1

## 2013-03-11 MED ORDER — VITAMIN B-6 100 MG PO TABS
200.0000 mg | ORAL_TABLET | Freq: Every day | ORAL | Status: DC
  Administered 2013-03-12 – 2013-03-13 (×2): 200 mg via ORAL
  Filled 2013-03-11 (×4): qty 2

## 2013-03-11 MED ORDER — ACETAMINOPHEN 325 MG PO TABS
650.0000 mg | ORAL_TABLET | Freq: Four times a day (QID) | ORAL | Status: DC | PRN
  Administered 2013-03-11 – 2013-03-12 (×2): 650 mg via ORAL
  Filled 2013-03-11 (×2): qty 2

## 2013-03-11 MED ORDER — ADULT MULTIVITAMIN W/MINERALS CH
1.0000 | ORAL_TABLET | Freq: Every day | ORAL | Status: DC
  Administered 2013-03-12 – 2013-03-13 (×2): 1 via ORAL
  Filled 2013-03-11 (×4): qty 1

## 2013-03-11 MED ORDER — SENNA 8.6 MG PO TABS
3.0000 | ORAL_TABLET | Freq: Every day | ORAL | Status: DC
  Administered 2013-03-11: 25.8 mg via ORAL
  Administered 2013-03-12: 17.2 mg via ORAL
  Administered 2013-03-14: 25.8 mg via ORAL
  Filled 2013-03-11 (×3): qty 3

## 2013-03-11 MED ORDER — DEXTROSE 5 % IV SOLN
1.0000 g | INTRAVENOUS | Status: DC
  Administered 2013-03-11 – 2013-03-12 (×2): 1 g via INTRAVENOUS
  Filled 2013-03-11 (×3): qty 10

## 2013-03-11 MED ORDER — ALPRAZOLAM 0.25 MG PO TABS
0.2500 mg | ORAL_TABLET | Freq: Two times a day (BID) | ORAL | Status: DC | PRN
  Administered 2013-03-11 – 2013-03-13 (×5): 0.25 mg via ORAL
  Filled 2013-03-11 (×5): qty 1

## 2013-03-11 MED ORDER — FUROSEMIDE 20 MG PO TABS
20.0000 mg | ORAL_TABLET | Freq: Every day | ORAL | Status: DC
  Administered 2013-03-11: 20 mg via ORAL
  Filled 2013-03-11: qty 1

## 2013-03-11 MED ORDER — IPRATROPIUM BROMIDE 0.02 % IN SOLN: 0.5000 mg | Freq: Three times a day (TID) | RESPIRATORY_TRACT | Status: DC

## 2013-03-11 MED ORDER — SERTRALINE HCL 50 MG PO TABS
50.0000 mg | ORAL_TABLET | Freq: Every day | ORAL | Status: DC
  Administered 2013-03-12 – 2013-03-14 (×3): 50 mg via ORAL
  Filled 2013-03-11 (×4): qty 1

## 2013-03-11 MED ORDER — MORPHINE SULFATE 10 MG/5ML PO SOLN
10.0000 mg | ORAL | Status: DC | PRN
  Administered 2013-03-13 – 2013-03-16 (×4): 10 mg via ORAL
  Filled 2013-03-11 (×4): qty 5

## 2013-03-11 MED ORDER — MAGIC MOUTHWASH
5.0000 mL | Freq: Four times a day (QID) | ORAL | Status: DC | PRN
  Filled 2013-03-11: qty 5

## 2013-03-11 MED ORDER — ALBUTEROL SULFATE (5 MG/ML) 0.5% IN NEBU: 2.5000 mg | INHALATION_SOLUTION | Freq: Three times a day (TID) | RESPIRATORY_TRACT | Status: DC

## 2013-03-11 MED ORDER — IPRATROPIUM BROMIDE 0.02 % IN SOLN: 0.5000 mg | Freq: Four times a day (QID) | RESPIRATORY_TRACT | Status: DC

## 2013-03-11 MED ORDER — METOCLOPRAMIDE HCL 5 MG PO TABS
5.0000 mg | ORAL_TABLET | Freq: Three times a day (TID) | ORAL | Status: DC
  Administered 2013-03-11 – 2013-03-13 (×7): 5 mg via ORAL
  Administered 2013-03-13: 12:00:00 via ORAL
  Administered 2013-03-14 (×4): 5 mg via ORAL
  Filled 2013-03-11 (×22): qty 1

## 2013-03-11 MED ORDER — ALBUTEROL SULFATE (5 MG/ML) 0.5% IN NEBU
INHALATION_SOLUTION | RESPIRATORY_TRACT | Status: AC
  Administered 2013-03-11: 2.5 mg via RESPIRATORY_TRACT
  Filled 2013-03-11: qty 0.5

## 2013-03-11 MED ORDER — ALBUTEROL (5 MG/ML) CONTINUOUS INHALATION SOLN
INHALATION_SOLUTION | RESPIRATORY_TRACT | Status: AC
  Administered 2013-03-11: 10 mg/h via RESPIRATORY_TRACT
  Filled 2013-03-11: qty 20

## 2013-03-11 MED ORDER — METHYLPREDNISOLONE 16 MG PO TABS
16.0000 mg | ORAL_TABLET | Freq: Every day | ORAL | Status: DC
  Administered 2013-03-11: 16 mg via ORAL
  Filled 2013-03-11: qty 1

## 2013-03-11 MED ORDER — SENNA 8.6 MG PO TABS
2.0000 | ORAL_TABLET | Freq: Every morning | ORAL | Status: DC
  Administered 2013-03-12 – 2013-03-14 (×2): 17.2 mg via ORAL
  Filled 2013-03-11: qty 2
  Filled 2013-03-11: qty 1

## 2013-03-11 MED ORDER — AZELASTINE-FLUTICASONE 137-50 MCG/ACT NA SUSP: 2.0000 | Freq: Every day | NASAL | Status: DC

## 2013-03-11 MED ORDER — LEVALBUTEROL HCL 0.63 MG/3ML IN NEBU
0.6300 mg | INHALATION_SOLUTION | RESPIRATORY_TRACT | Status: DC
Start: 2013-03-12 — End: 2013-03-14
  Administered 2013-03-11 – 2013-03-14 (×17): 0.63 mg via RESPIRATORY_TRACT
  Filled 2013-03-11 (×21): qty 3

## 2013-03-11 MED ORDER — ALBUTEROL SULFATE (5 MG/ML) 0.5% IN NEBU: 2.5000 mg | INHALATION_SOLUTION | RESPIRATORY_TRACT | Status: DC | PRN

## 2013-03-11 MED ORDER — POLYETHYLENE GLYCOL 3350 17 G PO PACK: 17.0000 g | PACK | Freq: Every day | ORAL | Status: DC

## 2013-03-11 MED ORDER — POTASSIUM CHLORIDE CRYS ER 20 MEQ PO TBCR
20.0000 meq | EXTENDED_RELEASE_TABLET | Freq: Two times a day (BID) | ORAL | Status: DC
  Administered 2013-03-11 – 2013-03-12 (×2): 20 meq via ORAL
  Filled 2013-03-11 (×3): qty 1

## 2013-03-11 MED ORDER — POLYETHYLENE GLYCOL 3350 17 G PO PACK
17.0000 g | PACK | Freq: Every day | ORAL | Status: DC
  Administered 2013-03-11 – 2013-03-14 (×4): 17 g via ORAL
  Filled 2013-03-11 (×6): qty 1

## 2013-03-11 MED ORDER — LEVALBUTEROL HCL 0.63 MG/3ML IN NEBU
0.6300 mg | INHALATION_SOLUTION | Freq: Four times a day (QID) | RESPIRATORY_TRACT | Status: DC
  Filled 2013-03-11 (×3): qty 3

## 2013-03-11 MED ORDER — ENOXAPARIN SODIUM 30 MG/0.3ML ~~LOC~~ SOLN
30.0000 mg | SUBCUTANEOUS | Status: DC
  Administered 2013-03-11 – 2013-03-12 (×2): 30 mg via SUBCUTANEOUS
  Filled 2013-03-11 (×3): qty 0.3

## 2013-03-11 MED ORDER — ONDANSETRON HCL 4 MG PO TABS: 4.0000 mg | ORAL_TABLET | Freq: Four times a day (QID) | ORAL | Status: DC | PRN

## 2013-03-11 MED ORDER — MONTELUKAST SODIUM 10 MG PO TABS
10.0000 mg | ORAL_TABLET | Freq: Every day | ORAL | Status: DC
  Administered 2013-03-11 – 2013-03-14 (×4): 10 mg via ORAL
  Filled 2013-03-11 (×5): qty 1

## 2013-03-11 MED ORDER — FUROSEMIDE 20 MG PO TABS: 20.0000 mg | ORAL_TABLET | Freq: Every day | ORAL | Status: DC

## 2013-03-11 MED ORDER — TIOTROPIUM BROMIDE MONOHYDRATE 18 MCG IN CAPS
18.0000 ug | ORAL_CAPSULE | Freq: Every day | RESPIRATORY_TRACT | Status: DC
  Filled 2013-03-11: qty 5

## 2013-03-11 MED ORDER — IPRATROPIUM BROMIDE 0.02 % IN SOLN
0.5000 mg | RESPIRATORY_TRACT | Status: DC
  Administered 2013-03-11 – 2013-03-14 (×17): 0.5 mg via RESPIRATORY_TRACT
  Filled 2013-03-11 (×18): qty 2.5

## 2013-03-11 NOTE — Progress Notes (Addendum)
Room [facility] ED 3 - Trish Fountain - [facility] RN Visit-R.Javia Dillow RN  Related admission to [facility] diagnosis of COPD.  Pt is DNR code.    Pt alert & oriented, lying upright on ED stretcher with nebulizer treatment running.  Pt complaints of pain 8/10 due to chronic low back pain.  Patient's son in law present. Patient's home medication list is on shadow chart.    Follow up 2:45pm - pt will be admitted by [facility]  ED Work up shows UTI, hyponatremia and leukocytosis-WBC 26.4.  Will continue to follow.   Please call [facility] @ [phone]- ask for RN Liaison or after hours,ask for on-call RN with any hospice needs.   Thank you.  Joneen Boers, RN  [facility]  Hospice Liaison

## 2013-03-11 NOTE — ED Provider Notes (Signed)
History     CSN: [id number]  Arrival date & time 04-05-2013  1030   First MD Initiated Contact with Patient 2013/04/05 1041      Chief Complaint  Patient presents with  . hospice pt for COPD, leg edema     (Consider location/radiation/quality/duration/timing/severity/associated sxs/prior treatment) HPI Patient presents to the emergency department with increased shortness of breath, and cough over the last 3 days.  Patient, states, that she is on hospice for chronic lung disease.  Patient was called in a prescription of Augmentin last night by her Dr. patient, states that her condition seems to be getting worse.  Patient, states that her inhalers are not helped with her symptoms.  Patient denies chest pain, nausea, diarrhea, abdominal pain, fever blurred vision, headache, dizziness, syncope, back pain, or dysuria.  Patient, states, that she's normally on 4 1/2 L of oxygen at home.     , Past Medical History  Diagnosis Date  . Asthmatic bronchitis   . Allergic rhinitis   . Atrial fibrillation   . History of aortic valve replacement Dr. Tyrone Sage 2006    Has severe residual gradient and will be managed conservatively  . Sleep apnea   . Chronic obstructive asthma   . Osteoporosis   . Pruritic disorder   . Diverticulosis   . Hemorrhoids   . GERD (gastroesophageal reflux disease)   . IBS (irritable bowel syndrome)   . Pacemaker 2010  . Oxygen dependent   . Atrial flutter     on Betapace  . High risk medication use     on Betapace & Coumadin  . Chronic anticoagulation   . COPD (chronic obstructive pulmonary disease)   . Pneumonia   . Anemia   . Arthritis   . Hypertension   . Coronary artery disease   . Pneumonia 09/2011    X 10 Sep 2011- June  2013  . Complication of anesthesia     trouble waking up  . CHF (congestive heart failure)   . Hospice care patient     Past Surgical History  Procedure Laterality Date  . Back surgery  1994  . Tonsillectomy    . Appendectomy     . Ganglion cyst excision    . Vocal cord polyps  1975 and 1984  . Aortic valve replacement  2006    Bioprosthetic  . Pacemaker insertion  2010  . Cardiac electrophysiology mapping and ablation    . Coronary artery bypass graft    . Insert / replace / remove pacemaker    . Eye surgery    . Cardiac catheterization    . Coronary angioplasty    . Cardiac valve replacement      Family History  Problem Relation Age of Onset  . Kidney failure Father   . Colon cancer Paternal Aunt   . Heart disease Maternal Grandfather   . Heart attack Paternal Grandfather   . Breast cancer Daughter   . Stroke Maternal Grandmother   . Heart disease Mother     History  Substance Use Topics  . Smoking status: Former Smoker -- 1.00 packs/day for 50 years    Types: Cigarettes    Quit date: 11/05/1993  . Smokeless tobacco: Never Used  . Alcohol Use: No    OB History   Grav Para Term Preterm Abortions TAB SAB Ect Mult Living                  Review of Systems All other systems  negative except as documented in the HPI. All pertinent positives and negatives as reviewed in the HPI. Allergies  Cephalexin; Atorvastatin; Azithromycin; Codeine; Crestor; Estradiol; Hydrocodone-acetaminophen; Hydrocortisone; Lovastatin; Moxifloxacin; Sulfonamide derivatives; and Vancomycin  Home Medications   Current Outpatient Rx  Name  Route  Sig  Dispense  Refill  . albuterol (PROVENTIL) (2.5 MG/3ML) 0.083% nebulizer solution   Nebulization   Take 3 mLs (2.5 mg total) by nebulization every 6 (six) hours as needed for wheezing or shortness of breath.   150 mL   12   . ALPRAZolam (XANAX) 0.25 MG tablet   Oral   Take 0.25 mg by mouth daily. For anxiety (0.5 Tablets PRN)         . Alum & Mag Hydroxide-Simeth (MAGIC MOUTHWASH) SOLN      For mouth irritationsTake 5 mLs by mouth 4 (four) times daily as needed. For mouth irritations   120 mL   0   . amoxicillin-clavulanate (AUGMENTIN) 500-125 MG per tablet    Oral   Take 1 tablet (500 mg total) by mouth 2 (two) times daily.   20 tablet   0   . Azelastine-Fluticasone (DYMISTA) 137-50 MCG/ACT SUSP   Each Nare   Place 2 sprays into both nostrils at bedtime.   1 Bottle   5   . Calcium Citrate-Vitamin D (CALCIUM CITRATE + D PO)   Oral   Take 1 tablet by mouth daily.         . Diclofenac Sodium (VOLTAREN TD)   Transdermal   Place onto the skin as needed.         . digoxin (LANOXIN) 0.125 MG tablet   Oral   Take 1 tablet (125 mcg total) by mouth every evening.   30 tablet   5   . ezetimibe (ZETIA) 10 MG tablet   Oral   Take 1 tablet (10 mg total) by mouth every evening.   30 tablet   12   . ferrous sulfate 325 (65 FE) MG tablet   Oral   Take 325 mg by mouth daily with breakfast.         . furosemide (LASIX) 40 MG tablet   Oral   Take 20 mg by mouth daily. Pt takes 20 mg between 3-4         . Guaifenesin 1200 MG TB12   Oral   Take 600 mg by mouth 2 (two) times daily.          Marland Kitchen levalbuterol (XOPENEX HFA) 45 MCG/ACT inhaler   Inhalation   Inhale 2 puffs into the lungs every 4 (four) hours as needed for wheezing or shortness of breath. For shortness of breath   1 Inhaler   0   . methylPREDNISolone (MEDROL) 8 MG tablet   Oral   Take 2 tablets (16 mg total) by mouth daily.   60 tablet   6   . metoCLOPramide (REGLAN) 5 MG tablet   Oral   Take 1 tablet by mouth 4 (four) times daily -  with meals and at bedtime.         . Mometasone Furo-Formoterol Fum (DULERA) 200-5 MCG/ACT AERO   Inhalation   Inhale 2 puffs into the lungs 2 (two) times daily as needed.   13 g   6   . morphine 10 MG/5ML solution   Oral   Take 10 mg by mouth every 2 (two) hours as needed for pain.         . Multiple Vitamin (  MULTIVITAMIN) capsule   Oral   Take 1 capsule by mouth daily.         Marland Kitchen NEXIUM 40 MG capsule      TAKE 1 TABLET BY MOUTH ONCE A DAY   90 capsule   1   . Omega-3 Fatty Acids (FISH OIL) 1200 MG CAPS    Oral   Take 1 capsule by mouth daily.         . polyethylene glycol (MIRALAX / GLYCOLAX) packet   Oral   Take 17 g by mouth daily.         . potassium chloride SA (K-DUR,KLOR-CON) 20 MEQ tablet   Oral   Take 1 tablet (20 mEq total) by mouth 2 (two) times daily.   60 tablet   11     90 day supply is acceptable   . pyridoxine (B-6) 200 MG tablet   Oral   Take 200 mg by mouth daily.         Marland Kitchen senna (SENOKOT) 8.6 MG tablet      2 every morning and 3 every evening         . senna-docusate (TGT SENNA LAX/STOOL SOFTENER) 8.6-50 MG per tablet   Oral   Take 2 tablets by mouth daily.         . sertraline (ZOLOFT) 50 MG tablet   Oral   Take 1 tablet (50 mg total) by mouth daily.   30 tablet   3   . sotalol (BETAPACE) 80 MG tablet   Oral   Take 1 tablet (80 mg total) by mouth 2 (two) times daily.   60 tablet   5   . tiotropium (SPIRIVA) 18 MCG inhalation capsule   Inhalation   Place 1 capsule (18 mcg total) into inhaler and inhale daily.   30 capsule   6   . vitamin C (ASCORBIC ACID) 500 MG tablet   Oral   Take 500 mg by mouth daily.         Marland Kitchen EPINEPHrine (EPIPEN JR) 0.15 MG/0.3ML injection   Intramuscular   Inject 0.3 mLs (0.15 mg total) into the muscle as needed for anaphylaxis.   1 each   prn   . montelukast (SINGULAIR) 10 MG tablet      TAKE 1 TABLET BY MOUTH EVERY DAY   30 tablet   5     BP 133/75  Pulse 67  Temp(Src) 98.5 F (36.9 C) (Oral)  Resp 23  SpO2 97%  Physical Exam  Nursing note and vitals reviewed. Constitutional: She is oriented to person, place, and time. She appears well-developed and well-nourished.  HENT:  Head: Normocephalic and atraumatic.  Mouth/Throat: Oropharynx is clear and moist.  Eyes: Pupils are equal, round, and reactive to light.  Neck: Normal range of motion. Neck supple.  Cardiovascular: Normal rate, regular rhythm and normal heart sounds.  Exam reveals no gallop and no friction rub.   No murmur  heard. Pulmonary/Chest: She is in respiratory distress. She has wheezes.  Abdominal: Soft. Bowel sounds are normal. She exhibits no distension. There is no tenderness.  Neurological: She is alert and oriented to person, place, and time.  Skin: Skin is warm and dry. No rash noted. No erythema.    ED Course  Procedures (including critical care time)  Labs Reviewed  BASIC METABOLIC PANEL - Abnormal; Notable for the following:    Sodium 126 (*)    Chloride 84 (*)    CO2 33 (*)  Glucose, Bld 167 (*)    GFR calc non Af Amer 79 (*)    All other components within normal limits  CBC WITH DIFFERENTIAL - Abnormal; Notable for the following:    WBC 26.4 (*)    Neutrophils Relative 92 (*)    Lymphocytes Relative 3 (*)    Neutro Abs 24.3 (*)    Monocytes Absolute 1.3 (*)    All other components within normal limits  PRO B NATRIURETIC PEPTIDE - Abnormal; Notable for the following:    Pro B Natriuretic peptide (BNP) 687.7 (*)    All other components within normal limits  URINALYSIS, ROUTINE W REFLEX MICROSCOPIC   Dg Chest 2 View  Mar 29, 2013  *RADIOLOGY REPORT*  Clinical Data: Cough and chronic lung disease  CHEST - 2 VIEW  Comparison: 01/23/2013  Findings: The cardiac shadow is stable.  A pacing device is again seen.  Persistent changes are noted in the right mid lung stable from prior study.  No new focal abnormality is seen.  Chronic compression deformities in the lower thoracic spine are again noted.  IMPRESSION: Chronic changes in the right. No acute abnormality is seen.   Original Report Authenticated By: Alcide Clever, M.D.      Patient will be admitted to the hospital for further care and evaluation of her respiratory distress.  I spoke with the, [facility], who will be down to admit the patient.  Patient is advised of the Plan and all questions were answered   MDM  MDM Reviewed: vitals, nursing note and previous chart Reviewed previous: labs and x-ray Interpretation: x-ray and  labs Consults: admitting MD            Carlyle Dolly, PA-C 03/13/13 0015

## 2013-03-11 NOTE — Telephone Encounter (Signed)
I spoke with [name] and she stated pt is feeling terrible. She is still getting terrible, still having increase SOB, legs weeping and swollen. Pt has already had 2 doses of augmenting. She wanted to know if we can do directed admit over the phone. I advised her if pt felt that bad pt needs to be seen through the ED to see if she needs to be admitted. I advised will forward to [name] so he will be aware.

## 2013-03-11 NOTE — Telephone Encounter (Signed)
Per CY-Pt to ED to be assessed. Thanks.

## 2013-03-11 NOTE — ED Notes (Signed)
Hospice rn called. Hospice x1 year for COPD. Productive cough, green sputum. Started on augmentin yesterday. Having edema lower extremities that is weeping. Hx of afib, not on coumadin. Valve replacement and pacemaker present. No issues with bowels or bladder. Stage II right buttocks has duderm on it. DNR.  Chronic lower back pain.  4 L [place] continous  Hospice nurse wendy [phone]

## 2013-03-11 NOTE — H&P (Signed)
[facility] History and Physical  [name] ZOX:[id number] DOB: April 06, 1924 DOA: 03/18/2013  Referring physician: Thayer Ohm lawyer, ER PA PCP: [name], MD  Specialists: None  Chief Complaint: Shortness of breath and cough  HPI: [name] is a 79 y.o. female  Past medical history of aortic stenosis, secondary heart failure from end stage COPD on hospice who presents to the emergency room with 1-2 weeks of progressively worsening shortness of breath, cough and fatigue the point where patient is a she's gasping for breath, has no appetite and is overall feels quite worse. She came into the emergency room and was noted to have a sodium of 126, BNP of only 687 and a white count 26.4-92% shift. Chest x-ray was unremarkable. Patient was given nebulizers and she was noted to have oxygen saturations of 93-94% on 4/2 L of oxygen which is her baseline at home. Nevertheless with her increased change in symptoms plus increased white count and sodium, it was felt best that she come to the hospital for further evaluation and treatment. Hospitals were called for admission.  Review of Systems: Patient seen after arrival to floor. Denies any headache or vision changes. Denies any chest pain he does, palpitations, shortness of breath, bilateral wheezing, denies coughing, although harder to get anything up. No abdominal pain, hematuria, dysuria, constipation or diarrhea, focal extremity numbness or weakness or pain. Overall she feels quite fatigued. She has no appetite. She does note some bilateral lower extremity weeping edema. Review systems otherwise negative.  Past Medical History  Diagnosis Date  . Asthmatic bronchitis   . Allergic rhinitis   . Atrial fibrillation   . History of aortic valve replacement Dr. Tyrone Sage 2006    Has severe residual gradient and will be managed conservatively  . Sleep apnea   . Chronic obstructive asthma   . Osteoporosis   . Pruritic disorder   .  Diverticulosis   . Hemorrhoids   . GERD (gastroesophageal reflux disease)   . IBS (irritable bowel syndrome)   . Pacemaker 2010  . Oxygen dependent   . Atrial flutter     on Betapace  . High risk medication use     on Betapace & Coumadin  . Chronic anticoagulation   . COPD (chronic obstructive pulmonary disease)   . Pneumonia   . Anemia   . Arthritis   . Hypertension   . Coronary artery disease   . Pneumonia 09/2011    X 10 Sep 2011- June  2013  . Complication of anesthesia     trouble waking up  . CHF (congestive heart failure)   . Hospice care patient    Past Surgical History  Procedure Laterality Date  . Back surgery  1994  . Tonsillectomy    . Appendectomy    . Ganglion cyst excision    . Vocal cord polyps  1975 and 1984  . Aortic valve replacement  2006    Bioprosthetic  . Pacemaker insertion  2010  . Cardiac electrophysiology mapping and ablation    . Coronary artery bypass graft    . Insert / replace / remove pacemaker    . Eye surgery    . Cardiac catheterization    . Coronary angioplasty    . Cardiac valve replacement     Social History:  reports that she quit smoking about 19 years ago. Her smoking use included Cigarettes. She has a 50 pack-year smoking history. She has never used smokeless tobacco. She reports that she  does not drink alcohol or use illicit drugs. Patient lives at home by herself with hospice and 24 hour care nursing. Family lives nearby. She mostly stays in bed but occasionally gets around with a wheelchair  Allergies  Allergen Reactions  . Cephalexin     Unsure as to reaction.  ? Hives with Cephalexin; "I can take Penicillin"  . Atorvastatin Other (See Comments)    Forgetfulness   . Azithromycin Nausea And Vomiting  . Codeine Nausea And Vomiting  . Crestor (Rosuvastatin Calcium)     Forgetfulness    . Estradiol     Unknown reaction  . Hydrocodone-Acetaminophen     Not allergic or intolerant-just watchful of medication  .  Hydrocortisone Hives    Flea bites were mistaken as rash from hydrocortisone  . Lovastatin Other (See Comments)    forgetfullness,  . Moxifloxacin     Unknown reaction  . Sulfonamide Derivatives     itching  . Vancomycin     Rash from cream with vancomycin    Family History  Problem Relation Age of Onset  . Kidney failure Father   . Colon cancer Paternal Aunt   . Heart disease Maternal Grandfather   . Heart attack Paternal Grandfather   . Breast cancer Daughter   . Stroke Maternal Grandmother   . Heart disease Mother      Prior to Admission medications   Medication Sig Start Date End Date Taking? Authorizing Provider  albuterol (PROVENTIL) (2.5 MG/3ML) 0.083% nebulizer solution Take 3 mLs (2.5 mg total) by nebulization every 6 (six) hours as needed for wheezing or shortness of breath. 09/26/12  Yes Waymon Budge, MD  ALPRAZolam Prudy Feeler) 0.25 MG tablet Take 0.25 mg by mouth daily. For anxiety (0.5 Tablets PRN)   Yes Historical Provider, MD  Alum & Mag Hydroxide-Simeth (MAGIC MOUTHWASH) SOLN For mouth irritationsTake 5 mLs by mouth 4 (four) times daily as needed. For mouth irritations 09/26/12  Yes Waymon Budge, MD  amoxicillin-clavulanate (AUGMENTIN) 500-125 MG per tablet Take 1 tablet (500 mg total) by mouth 2 (two) times daily. 03/10/13  Yes Waymon Budge, MD  Azelastine-Fluticasone (DYMISTA) 137-50 MCG/ACT SUSP Place 2 sprays into both nostrils at bedtime. 02/04/13  Yes Waymon Budge, MD  Calcium Citrate-Vitamin D (CALCIUM CITRATE + D PO) Take 1 tablet by mouth daily.   Yes Historical Provider, MD  Diclofenac Sodium (VOLTAREN TD) Place onto the skin as needed.   Yes Historical Provider, MD  digoxin (LANOXIN) 0.125 MG tablet Take 1 tablet (125 mcg total) by mouth every evening. 03/09/13  Yes Vesta Mixer, MD  ezetimibe (ZETIA) 10 MG tablet Take 1 tablet (10 mg total) by mouth every evening. 10/01/12  Yes Vesta Mixer, MD  ferrous sulfate 325 (65 FE) MG tablet Take 325 mg by  mouth daily with breakfast.   Yes Historical Provider, MD  furosemide (LASIX) 40 MG tablet Take 20 mg by mouth daily. Pt takes 20 mg between 3-4 01/30/13  Yes Vesta Mixer, MD  Guaifenesin 1200 MG TB12 Take 600 mg by mouth 2 (two) times daily.  05/10/12  Yes Rodolph Bong, MD  levalbuterol [facility] HFA) 45 MCG/ACT inhaler Inhale 2 puffs into the lungs every 4 (four) hours as needed for wheezing or shortness of breath. For shortness of breath 05/10/12  Yes Rodolph Bong, MD  methylPREDNISolone (MEDROL) 8 MG tablet Take 2 tablets (16 mg total) by mouth daily. 11/18/12 11/18/13 Yes Waymon Budge, MD  metoCLOPramide (  REGLAN) 5 MG tablet Take 1 tablet by mouth 4 (four) times daily -  with meals and at bedtime. 11/18/12  Yes Historical Provider, MD  Mometasone Furo-Formoterol Fum (DULERA) 200-5 MCG/ACT AERO Inhale 2 puffs into the lungs 2 (two) times daily as needed. 08/07/12  Yes Waymon Budge, MD  morphine 10 MG/5ML solution Take 10 mg by mouth every 2 (two) hours as needed for pain.   Yes Historical Provider, MD  Multiple Vitamin (MULTIVITAMIN) capsule Take 1 capsule by mouth daily.   Yes Historical Provider, MD  NEXIUM 40 MG capsule TAKE 1 TABLET BY MOUTH ONCE A DAY 10/29/12  Yes Pecola Lawless, MD  Omega-3 Fatty Acids (FISH OIL) 1200 MG CAPS Take 1 capsule by mouth daily.   Yes Historical Provider, MD  polyethylene glycol (MIRALAX / GLYCOLAX) packet Take 17 g by mouth daily.   Yes Historical Provider, MD  potassium chloride SA (K-DUR,KLOR-CON) 20 MEQ tablet Take 1 tablet (20 mEq total) by mouth 2 (two) times daily. 03/25/12  Yes Vesta Mixer, MD  pyridoxine (B-6) 200 MG tablet Take 200 mg by mouth daily.   Yes Historical Provider, MD  senna (SENOKOT) 8.6 MG tablet 2 every morning and 3 every evening   Yes Historical Provider, MD  senna-docusate (TGT SENNA LAX/STOOL SOFTENER) 8.6-50 MG per tablet Take 2 tablets by mouth daily.   Yes Historical Provider, MD  sertraline (ZOLOFT) 50 MG tablet  Take 1 tablet (50 mg total) by mouth daily. 12/11/12  Yes Waymon Budge, MD  sotalol (BETAPACE) 80 MG tablet Take 1 tablet (80 mg total) by mouth 2 (two) times daily. 01/16/13  Yes Vesta Mixer, MD  tiotropium (SPIRIVA) 18 MCG inhalation capsule Place 1 capsule (18 mcg total) into inhaler and inhale daily. 11/18/12  Yes Waymon Budge, MD  vitamin C (ASCORBIC ACID) 500 MG tablet Take 500 mg by mouth daily.   Yes Historical Provider, MD  EPINEPHrine (EPIPEN JR) 0.15 MG/0.3ML injection Inject 0.3 mLs (0.15 mg total) into the muscle as needed for anaphylaxis. 04/11/12 04/11/13  Waymon Budge, MD  montelukast (SINGULAIR) 10 MG tablet TAKE 1 TABLET BY MOUTH EVERY DAY 12/23/12   Waymon Budge, MD   Physical Exam: Filed Vitals:   03/16/2013 1430 03/28/2013 1500 03/15/2013 1645 03/18/2013 1707  BP: 141/57 144/60  138/56  Pulse: 68 65  66  Temp:    98 F (36.7 C)  TempSrc:    Oral  Resp: 22 19  20   SpO2: 97% 97% 94% 98%     General:  Alert and oriented x3, moderate distress secondary shortness of breath and fatigue  Eyes: Sclera nonicteric, extraocular movements are intact  ENT: Normocephalic, atraumatic, mucous members are slightly dry  Neck: Supple no JVD  Cardiovascular: Regular rate and rhythm, S1-S2, 2/6 systolic ejection murmur  Respiratory: Scattered rhonchi, bilateral end expiratory wheezing  Abdomen: Soft, nontender, nondistended, positive bowel sounds  Skin: Frail, few skin tears  Musculoskeletal: No clubbing or cyanosis, 1+ pitting edema from the knees down  Psychiatric: Patient is appropriate, no evidence of psychoses  Neurologic: No overt deficits  Labs on Admission:  Basic Metabolic Panel:  Recent Labs Lab 03/24/2013 1100  NA 126*  K 4.5  CL 84*  CO2 33*  GLUCOSE 167*  BUN 23  CREATININE 0.64  CALCIUM 9.5   CBC:  Recent Labs Lab 03/16/2013 1100  WBC 26.4*  NEUTROABS 24.3*  HGB 12.2  HCT 37.6  MCV 85.1  PLT 236  BNP (last 3 results)  Recent Labs   09/01/12 1905 03-24-13 1100  PROBNP 796.0* 687.7*    Radiological Exams on Admission: Dg Chest 2 View  03/24/2013  .  IMPRESSION: Chronic changes in the right. No acute abnormality is seen.   Original Report Authenticated By: Alcide Clever, M.D.     EKG: Independently reviewed. Paced atrial complexes  Assessment/Plan Active Problems:   Atrial fibrillation: Currently normal sinus rhythm.   Aortic stenosis: Noted. No plans for IV hydration at this point   Pacemaker   CAP (community acquired pneumonia)   Diastolic CHF, acute on chronic: Mild. Elevated BNP more from respiratory. Increase Lasix for diuresis.    Leukocytosis: Secondary to pneumonia. Some of this may be due to chronic prednisone use.    Hyponatremia: Secondary to dehydration. Recheck in the morning.  COPD exacerbation: Principal problem. IV steroids plus nebulizers plus oxygen plus antibiotics. Have reviewed previous records and patient was able to tolerate Rocephin and Zithromax in the past so we'll continue.  Code Status: DO NOT RESUSCITATE.  Family Communication: Plan discussed with patient's son and daughter at the bedside.  Disposition Plan: Home in the next one to 2 days with hospice  Time spent: 35 minutes  Hollice Espy [facility] Pager [phone]  If 7PM-7AM, please contact night-coverage [email or web address] Password [facility] 03/24/13, 6:45 PM

## 2013-03-11 NOTE — ED Notes (Signed)
Bed:WA03<BR> Expected date:<BR> Expected time:<BR> Means of arrival:<BR> Comments:<BR>

## 2013-03-11 NOTE — Progress Notes (Signed)
[facility] ED CM spoke with pt and female and female family at bedside about Cm consult. Cm informed that pt is followed by home instead and hospice Preference is not to have further home care staff Reports having lots of family members and home in stead staff in to see pt presently   Discussed that pt to be possibly further evaluated by unit therapists (PT/OT) for possible recommendation of level of care and share this with attending MD and unit CM (who will share this with the pt/family)

## 2013-03-12 ENCOUNTER — Encounter (HOSPITAL_COMMUNITY): Payer: Self-pay | Admitting: Internal Medicine

## 2013-03-12 DIAGNOSIS — F32A Depression, unspecified: Secondary | ICD-10-CM

## 2013-03-12 DIAGNOSIS — F419 Anxiety disorder, unspecified: Secondary | ICD-10-CM

## 2013-03-12 DIAGNOSIS — F329 Major depressive disorder, single episode, unspecified: Secondary | ICD-10-CM | POA: Diagnosis present

## 2013-03-12 DIAGNOSIS — J441 Chronic obstructive pulmonary disease with (acute) exacerbation: Secondary | ICD-10-CM | POA: Diagnosis present

## 2013-03-12 DIAGNOSIS — T380X5A Adverse effect of glucocorticoids and synthetic analogues, initial encounter: Secondary | ICD-10-CM | POA: Diagnosis present

## 2013-03-12 DIAGNOSIS — E785 Hyperlipidemia, unspecified: Secondary | ICD-10-CM | POA: Diagnosis present

## 2013-03-12 HISTORY — DX: Depression, unspecified: F32.A

## 2013-03-12 HISTORY — DX: Anxiety disorder, unspecified: F41.9

## 2013-03-12 LAB — EXPECTORATED SPUTUM ASSESSMENT W GRAM STAIN, RFLX TO RESP C: Special Requests: NORMAL

## 2013-03-12 LAB — BASIC METABOLIC PANEL
BUN: 21 mg/dL (ref 6–23)
Calcium: 9.7 mg/dL (ref 8.4–10.5)
GFR calc Af Amer: >90 mL/min (ref >90)
GFR calc non Af Amer: 78 mL/min — ABNORMAL LOW (ref >90)
Glucose, Bld: 180 mg/dL — ABNORMAL HIGH (ref 70–99)
Potassium: 5.3 mEq/L — ABNORMAL HIGH (ref 3.5–5.1)
Sodium: 126 mEq/L — ABNORMAL LOW (ref 135–145)

## 2013-03-12 LAB — CBC
MCH: 26.5 pg (ref 26.0–34.0)
MCHC: 31 g/dL (ref 30.0–36.0)
RDW: 15.1 % (ref 11.5–15.5)

## 2013-03-12 LAB — GLUCOSE, CAPILLARY
Glucose-Capillary: 122 mg/dL — ABNORMAL HIGH (ref 70–99)
Glucose-Capillary: 327 mg/dL — ABNORMAL HIGH (ref 70–99)

## 2013-03-12 MED ORDER — OMEGA-3-ACID ETHYL ESTERS 1 G PO CAPS
1.0000 g | ORAL_CAPSULE | Freq: Two times a day (BID) | ORAL | Status: DC
  Administered 2013-03-12 – 2013-03-14 (×3): 1 g via ORAL
  Filled 2013-03-12 (×8): qty 1

## 2013-03-12 MED ORDER — KETOROLAC TROMETHAMINE 15 MG/ML IJ SOLN
15.0000 mg | Freq: Once | INTRAMUSCULAR | Status: AC
  Administered 2013-03-12: 15 mg via INTRAVENOUS
  Filled 2013-03-12: qty 1

## 2013-03-12 MED ORDER — HYDROCOD POLST-CHLORPHEN POLST 10-8 MG/5ML PO LQCR: 5.0000 mL | Freq: Two times a day (BID) | ORAL | Status: DC | PRN

## 2013-03-12 MED ORDER — BOOST / RESOURCE BREEZE PO LIQD
1.0000 | Freq: Three times a day (TID) | ORAL | Status: DC
  Administered 2013-03-12: 1 via ORAL

## 2013-03-12 MED ORDER — GUAIFENESIN ER 600 MG PO TB12
600.0000 mg | ORAL_TABLET | Freq: Two times a day (BID) | ORAL | Status: DC
  Administered 2013-03-12 – 2013-03-14 (×3): 600 mg via ORAL
  Filled 2013-03-12 (×8): qty 1

## 2013-03-12 MED ORDER — DIPHENHYDRAMINE HCL 50 MG/ML IJ SOLN: 25.0000 mg | Freq: Once | INTRAMUSCULAR | Status: DC

## 2013-03-12 MED ORDER — INSULIN ASPART 100 UNIT/ML ~~LOC~~ SOLN
0.0000 [IU] | Freq: Three times a day (TID) | SUBCUTANEOUS | Status: DC
  Administered 2013-03-13: 5 [IU] via SUBCUTANEOUS
  Administered 2013-03-13: 3 [IU] via SUBCUTANEOUS
  Administered 2013-03-13: 2 [IU] via SUBCUTANEOUS
  Administered 2013-03-14: 5 [IU] via SUBCUTANEOUS

## 2013-03-12 MED ORDER — ACETAMINOPHEN 500 MG PO TABS: 1000.0000 mg | ORAL_TABLET | Freq: Four times a day (QID) | ORAL | Status: DC | PRN

## 2013-03-12 MED ORDER — SODIUM POLYSTYRENE SULFONATE 15 GM/60ML PO SUSP
15.0000 g | Freq: Once | ORAL | Status: AC
  Administered 2013-03-12: 15 g via ORAL
  Filled 2013-03-12: qty 60

## 2013-03-12 MED ORDER — INSULIN ASPART 100 UNIT/ML ~~LOC~~ SOLN
0.0000 [IU] | Freq: Every day | SUBCUTANEOUS | Status: DC
  Administered 2013-03-12: 4 [IU] via SUBCUTANEOUS

## 2013-03-12 MED ORDER — METOCLOPRAMIDE HCL 5 MG/ML IJ SOLN
10.0000 mg | Freq: Once | INTRAMUSCULAR | Status: DC
  Filled 2013-03-12: qty 2

## 2013-03-12 NOTE — Progress Notes (Signed)
Room 1337 - Mrs [name] - [facility] RN Visit-R.Areanna Gengler RN  Related admission to [facility] diagnosis of COPD.   Pt is DNR code.   Pt alert & oriented, lying upright in bed, with complaints of feeling miserable due to breathing - pt currently getting nebulized breathing treatment.  Pt's dtr Lynden Ang and private paid caregiver present.  Pt states she is confused at times and her breathing and gets very frustrated.  Per dtr, pt was given some Ativan which helped her agitation.  Patient's home medication list is on shadow chart.   Please call [facility] @ [phone]- ask for RN Liaison or after hours,ask for on-call RN with any hospice needs.   Thank you.  Joneen Boers, RN  [facility]  Hospice Liaison

## 2013-03-12 NOTE — Progress Notes (Signed)
CARE MANAGEMENT NOTE 03/12/2013  Patient:  [name], [name]   Account Number:  [id number]  Date Initiated:  03/12/2013  Documentation initiated by:  Ezekiel Ina  Subjective/Objective Assessment:   Pt with SOB, hx of COPD     Action/Plan:   from home with Hospice   Anticipated DC Date:  03/14/2013   Anticipated DC Plan:  HOME W HOSPICE CARE      DC Planning Services  CM consult      Choice offered to / List presented to:             Status of service:  In process, will continue to follow Medicare Important Message given?  NA - LOS <3 / Initial given by admissions (If response is "NO", the following Medicare IM given date fields will be blank) Date Medicare IM given:   Date Additional Medicare IM given:    Discharge Disposition:    Per UR Regulation:  Reviewed for med. necessity/level of care/duration of stay  If discussed at Long Length of Stay Meetings, dates discussed:    Comments:  03/12/13  MMcGibboney, RN, BSN Chart reviewed.  Pt plan to dc home, active with [facility]/has 24/7 private duty care at home.

## 2013-03-12 NOTE — Progress Notes (Signed)
Nutrition Brief Note  Patient identified on the Malnutrition Screening Tool (MST) Report  Body mass index is 21.89 kg/(m^2). Patient meets criteria for normal weight based on current BMI.   Current diet order is regular, patient reports eating well, had a chicken sandwich for lunch. Labs and medications reviewed. Pt with low sodium, and elevated potassium and glucose. Pt is a hospice care pt. Pt with secondary heart failure from end stage COPD. Pt reports poor appetite for the past 1-2 weeks. Pt reports she tried Ensure in the past but didn't like it but was interested in [employer], will order. She reports minimal weight loss. Pt reports occasional reflux, requests Nexium before meals as she was taking at home, text paged MD with this request.   No further nutrition interventions warranted at this time. If nutrition issues arise, please consult RD.   Levon Hedger MS, RD, LDN [phone] Pager [phone] After Hours Pager

## 2013-03-12 NOTE — Progress Notes (Signed)
[facility] PROGRESS NOTE  [name] ZOX:[id number] DOB: 1923/12/29 DOA: 04-Apr-2013 PCP: [name], MD  Brief narrative: [name] is an 79 y.o. female with a PMH of aortic stenosis, secondary heart failure from end stage COPD on hospice who presented to the emergency room on 2013/04/04 with 1-2 weeks of progressively worsening shortness of breath, cough and fatigue.  Initial evaluation in the emergency room revealed the patient's sodium to be 126, BNP of 687 and a white count of 26. Chest x-ray was unremarkable. Patient was given nebulizers and she was noted to have oxygen saturations of 93-94% on 4/2 L of oxygen which is her baseline at home.   Assessment/Plan: Principal Problem:   COPD exacerbation with acute on chronic respiratory failure secondary to community-acquired pneumonia -Empirically being treated with empiric IV steroids, nebulized bronchodilator therapy, supplemental oxygen, and appeared antibiotics (Rocephin and azithromycin). -Resume Mucinex. -Note: Dulera and Spiriva on hold. -Check sputum culture. Active Problems:   Atrial fibrillation -Maintaining normal sinus rhythm. Continue digoxin and sotalol.   Aortic stenosis / pacemaker -Watch for heart failure. No current maintenance IV fluids ordered.   Diastolic CHF, acute on chronic -Diurese as tolerated. Current dose of Lasix is double her home dose. Watch renal function.   Leukocytosis -Monitor white blood cell count. Maybe secondary to steroids versus infection.   Hyponatremia -Likely SIADH from lung process. Monitor.   Hyperkalemia -Give one dose of Kayexalate, 15 g today.   Steroid-induced hyperglycemia -Start SSRI, moderate scale.   Depression/anxiety -Continue Xanax and Zoloft.   Dyslipidemia -Continue Zetia. Resume fish oil.  Code Status: DNR Family Communication: No family at bedside. Disposition Plan: Home when stable.   Medical Consultants:  None.  Other  Consultants:  None.  Anti-infectives:  Rocephin 04/04/2013--->  Azithromycin 2013/04/04--->  HPI/Subjective: [name] is still quite dyspneic and using accessory muscles to breathe. She is a bit irritated that her breakfast has not been brought up yet. Denies chest pain and nausea.  Objective: Filed Vitals:   03/12/13 0319 03/12/13 0520 03/12/13 0521 03/12/13 0759  BP:  129/54    Pulse:  65    Temp:  98.2 F (36.8 C)    TempSrc:  Oral    Resp:  18    Height:      Weight:      SpO2: 97% 100% 98% 98%    Intake/Output Summary (Last 24 hours) at 03/12/13 0959 Last data filed at 03/12/13 0430  Gross per 24 hour  Intake    300 ml  Output    200 ml  Net    100 ml    Exam: Gen:  NAD Cardiovascular:  RRR, No M/R/G Respiratory:  Lungs with expiratory wheezes throughout Gastrointestinal:  Abdomen soft, NT/ND, + BS Extremities:  No C/E/C  Data Reviewed: Basic Metabolic Panel:  Recent Labs Lab 04/04/2013 1100 03/12/13 0433  NA 126* 126*  K 4.5 5.3*  CL 84* 84*  CO2 33* 35*  GLUCOSE 167* 180*  BUN 23 21  CREATININE 0.64 0.66  CALCIUM 9.5 9.7   GFR Estimated Creatinine Clearance: 39.9 ml/min (by C-G formula based on Cr of 0.66).  CBC:  Recent Labs Lab 04-04-13 1100 03/12/13 0433  WBC 26.4* 25.0*  NEUTROABS 24.3*  --   HGB 12.2 12.3  HCT 37.6 39.7  MCV 85.1 85.6  PLT 236 248   BNP (last 3 results)  Recent Labs  09/01/12 1905 04-04-2013 1100  PROBNP 796.0* 687.7*   Microbiology No results  found for this or any previous visit (from the past 240 hour(s)).   Procedures and Diagnostic Studies: Dg Chest 2 View  03/24/13  *RADIOLOGY REPORT*  Clinical Data: Cough and chronic lung disease  CHEST - 2 VIEW  Comparison: 01/23/2013  Findings: The cardiac shadow is stable.  A pacing device is again seen.  Persistent changes are noted in the right mid lung stable from prior study.  No new focal abnormality is seen.  Chronic compression deformities in the  lower thoracic spine are again noted.  IMPRESSION: Chronic changes in the right. No acute abnormality is seen.   Original Report Authenticated By: Alcide Clever, M.D.     Scheduled Meds: . ALPRAZolam  0.25 mg Oral QHS  . azelastine  2 spray Each Nare QHS   And  . fluticasone  2 spray Each Nare QHS  . azithromycin  500 mg Intravenous Q24H  . cefTRIAXone (ROCEPHIN)  IV  1 g Intravenous Q24H  . digoxin  125 mcg Oral QHS  . enoxaparin (LOVENOX) injection  30 mg Subcutaneous Q24H  . ezetimibe  10 mg Oral QHS  . ferrous sulfate  325 mg Oral Q breakfast  . furosemide  40 mg Oral BID  . ipratropium  0.5 mg Nebulization Q4H  . levalbuterol  0.63 mg Nebulization Q4H  . methylPREDNISolone (SOLU-MEDROL) injection  60 mg Intravenous Q8H  . metoCLOPramide  5 mg Oral TID WC & HS  . montelukast  10 mg Oral QHS  . multivitamin with minerals  1 tablet Oral Daily  . pantoprazole  80 mg Oral Daily  . polyethylene glycol  17 g Oral Daily  . potassium chloride SA  20 mEq Oral BID  . pyridoxine  200 mg Oral Daily  . senna  2 tablet Oral q morning - 10a  . senna  3 tablet Oral QHS  . sertraline  50 mg Oral Daily  . sotalol  80 mg Oral BID   Continuous Infusions:   Time spent: 35 minutes.   LOS: 1 day   [name],[name]  [facility] Pager [phone].  If 8PM-8AM, please contact night-coverage at [email or web address], password [facility] 03/12/2013, 9:59 AM

## 2013-03-13 LAB — BASIC METABOLIC PANEL
Calcium: 9.4 mg/dL (ref 8.4–10.5)
GFR calc non Af Amer: 74 mL/min — ABNORMAL LOW (ref >90)
Glucose, Bld: 172 mg/dL — ABNORMAL HIGH (ref 70–99)
Potassium: 5.9 mEq/L — ABNORMAL HIGH (ref 3.5–5.1)
Sodium: 125 mEq/L — ABNORMAL LOW (ref 135–145)

## 2013-03-13 LAB — CBC
Hemoglobin: 10.6 g/dL — ABNORMAL LOW (ref 12.0–15.0)
MCH: 26.5 pg (ref 26.0–34.0)
MCHC: 30.8 g/dL (ref 30.0–36.0)
Platelets: 228 10*3/uL (ref 150–400)
RBC: 4 MIL/uL (ref 3.87–5.11)

## 2013-03-13 LAB — GLUCOSE, CAPILLARY

## 2013-03-13 MED ORDER — PIPERACILLIN-TAZOBACTAM 3.375 G IVPB
3.3750 g | Freq: Three times a day (TID) | INTRAVENOUS | Status: DC
  Administered 2013-03-13 – 2013-03-15 (×6): 3.375 g via INTRAVENOUS
  Filled 2013-03-13 (×8): qty 50

## 2013-03-13 MED ORDER — SODIUM POLYSTYRENE SULFONATE 15 GM/60ML PO SUSP
30.0000 g | Freq: Once | ORAL | Status: AC
  Administered 2013-03-13: 30 g via ORAL
  Filled 2013-03-13: qty 120

## 2013-03-13 MED ORDER — ENOXAPARIN SODIUM 40 MG/0.4ML ~~LOC~~ SOLN
40.0000 mg | SUBCUTANEOUS | Status: DC
  Administered 2013-03-14: 40 mg via SUBCUTANEOUS
  Filled 2013-03-13 (×4): qty 0.4

## 2013-03-13 NOTE — Progress Notes (Signed)
[facility] PROGRESS NOTE  [name] ZOX:[id number] DOB: February 17, 1924 DOA: 03/18/2013 PCP: [name], MD  Brief narrative: [name] is an 79 y.o. female with a PMH of aortic stenosis, secondary heart failure from end stage COPD on hospice who presented to the emergency room on 03/06/2013 with 1-2 weeks of progressively worsening shortness of breath, cough and fatigue.  Initial evaluation in the emergency room revealed the patient's sodium to be 126, BNP of 687 and a white count of 26. Chest x-ray was unremarkable. Patient was given nebulizers and she was noted to have oxygen saturations of 93-94% on 4/2 L of oxygen which is her baseline at home.   Assessment/Plan: Principal Problem:   COPD exacerbation with acute on chronic respiratory failure secondary to community-acquired pneumonia -Empirically being treated with empiric IV steroids, nebulized bronchodilator therapy, supplemental oxygen, and empiric antibiotics. -Continue Mucinex. -Note: Dulera and Spiriva on hold. -Sputum culture growing abundant GNR.  Will change antibiotics from Rocephin/azithro to Zosyn. Active Problems:   Atrial fibrillation -Maintaining normal sinus rhythm. Continue digoxin and sotalol.   Aortic stenosis / pacemaker -Watch for heart failure. No current maintenance IV fluids ordered.   Diastolic CHF, acute on chronic -Diurese as tolerated. Current dose of Lasix is double her home dose. Watch renal function. Renal function stable today.   Leukocytosis -Monitor white blood cell count. Maybe secondary to steroids versus infection.   Hyponatremia -Likely SIADH from lung process. Monitor.   Hyperkalemia -Give one dose of Kayexalate, 15 g today.  No improvement.  Give 30 g today.   Steroid-induced hyperglycemia -Continue SSI, moderate scale. CBG 122-327.   Depression/anxiety -Continue Xanax and Zoloft.   Dyslipidemia -Continue Zetia and fish oil.  Code Status: DNR Family Communication: Sitter  at bedside. Disposition Plan: Home when stable.   Medical Consultants:  None.  Other Consultants:  None.  Anti-infectives:  Rocephin 04/01/2013---> 03/13/2013  Azithromycin 03/23/2013---> 03/13/2013  Zosyn 03/13/2013--->  HPI/Subjective: [name] is still quite dyspneic and appears much weaker today. Her bowels have not moved. Appetite is poor. No chest pain.  Objective: Filed Vitals:   03/12/13 2143 03/12/13 2201 03/13/13 0505 03/13/13 0811  BP:  175/60 125/48   Pulse: 84 87 59   Temp:  98 F (36.7 C) 97.3 F (36.3 C)   TempSrc:  Oral Axillary   Resp:  24 20   Height:      Weight:      SpO2:   100% 96%    Intake/Output Summary (Last 24 hours) at 03/13/13 1034 Last data filed at 03/12/13 1842  Gross per 24 hour  Intake    360 ml  Output      0 ml  Net    360 ml    Exam: Gen:  NAD Cardiovascular:  RRR, No M/R/G Respiratory:  Lungs with expiratory wheezes throughout Gastrointestinal:  Abdomen softly distended. Positive bowel sounds. Extremities:  No C/E/C  Data Reviewed:  Basic Metabolic Panel:  Recent Labs Lab 03/07/2013 1100 03/12/13 0433 03/13/13 0353  NA 126* 126* 125*  K 4.5 5.3* 5.9*  CL 84* 84* 84*  CO2 33* 35* 33*  GLUCOSE 167* 180* 172*  BUN 23 21 26*  CREATININE 0.64 0.66 0.76  CALCIUM 9.5 9.7 9.4   GFR Estimated Creatinine Clearance: 39.9 ml/min (by C-G formula based on Cr of 0.76).  CBC:  Recent Labs Lab 03/14/2013 1100 03/12/13 0433 03/13/13 0353  WBC 26.4* 25.0* 25.4*  NEUTROABS 24.3*  --   --  HGB 12.2 12.3 10.6*  HCT 37.6 39.7 34.4*  MCV 85.1 85.6 86.0  PLT 236 248 228   CBG:  Recent Labs Lab 03/12/13 1700 03/12/13 2143 03/13/13 0752  GLUCAP 122* 327* 160*   Microbiology Recent Results (from the past 240 hour(s))  CULTURE, EXPECTORATED SPUTUM-ASSESSMENT     Status: None   Collection Time    03/12/13 12:40 PM      Result Value Range Status   Specimen Description SPUTUM   Final   Special Requests Normal    Final   Sputum evaluation     Final   Value: THIS SPECIMEN IS ACCEPTABLE. RESPIRATORY CULTURE REPORT TO FOLLOW.   Report Status 03/12/2013 FINAL   Final  CULTURE, RESPIRATORY (NON-EXPECTORATED)     Status: None   Collection Time    03/12/13 12:40 PM      Result Value Range Status   Specimen Description SPUTUM   Final   Special Requests NONE   Final   Gram Stain     Final   Value: ABUNDANT WBC PRESENT,BOTH PMN AND MONONUCLEAR     NO SQUAMOUS EPITHELIAL CELLS SEEN     ABUNDANT GRAM NEGATIVE RODS   Culture ABUNDANT GRAM NEGATIVE RODS   Final   Report Status PENDING   Incomplete      Procedures and Diagnostic Studies: Dg Chest 2 View  04/02/2013  *RADIOLOGY REPORT*  Clinical Data: Cough and chronic lung disease  CHEST - 2 VIEW  Comparison: 01/23/2013  Findings: The cardiac shadow is stable.  A pacing device is again seen.  Persistent changes are noted in the right mid lung stable from prior study.  No new focal abnormality is seen.  Chronic compression deformities in the lower thoracic spine are again noted.  IMPRESSION: Chronic changes in the right. No acute abnormality is seen.   Original Report Authenticated By: Alcide Clever, M.D.     Scheduled Meds: . ALPRAZolam  0.25 mg Oral QHS  . azelastine  2 spray Each Nare QHS   And  . fluticasone  2 spray Each Nare QHS  . azithromycin  500 mg Intravenous Q24H  . cefTRIAXone (ROCEPHIN)  IV  1 g Intravenous Q24H  . digoxin  125 mcg Oral QHS  . diphenhydrAMINE  25 mg Intravenous Once  . enoxaparin (LOVENOX) injection  30 mg Subcutaneous Q24H  . ezetimibe  10 mg Oral QHS  . feeding supplement  1 Container Oral TID BM  . ferrous sulfate  325 mg Oral Q breakfast  . furosemide  40 mg Oral BID  . guaiFENesin  600 mg Oral BID  . insulin aspart  0-15 Units Subcutaneous TID WC  . insulin aspart  0-5 Units Subcutaneous QHS  . ipratropium  0.5 mg Nebulization Q4H  . levalbuterol  0.63 mg Nebulization Q4H  . methylPREDNISolone (SOLU-MEDROL)  injection  60 mg Intravenous Q8H  . metoCLOPramide (REGLAN) injection  10 mg Intravenous Once  . metoCLOPramide  5 mg Oral TID WC & HS  . montelukast  10 mg Oral QHS  . multivitamin with minerals  1 tablet Oral Daily  . omega-3 acid ethyl esters  1 g Oral BID  . pantoprazole  80 mg Oral Daily  . polyethylene glycol  17 g Oral Daily  . pyridoxine  200 mg Oral Daily  . senna  2 tablet Oral q morning - 10a  . senna  3 tablet Oral QHS  . sertraline  50 mg Oral Daily  . sotalol  80 mg  Oral BID   Continuous Infusions:   Time spent: 35 minutes.   LOS: 2 days   Yeshaya Vath  [facility] Pager [phone].  If 8PM-8AM, please contact night-coverage at [email or web address], password [facility] 03/13/2013, 10:34 AM

## 2013-03-13 NOTE — Progress Notes (Signed)
ANTIBIOTIC CONSULT NOTE - INITIAL  Pharmacy Consult for Zosyn Indication: HCAP with GNR in sputum  Allergies  Allergen Reactions  . Cephalexin     Unsure as to reaction.  ? Hives with Cephalexin; "I can take Penicillin"  . Atorvastatin Other (See Comments)    Forgetfulness   . Azithromycin Nausea And Vomiting  . Codeine Nausea And Vomiting  . Crestor (Rosuvastatin Calcium)     Forgetfulness    . Estradiol     Unknown reaction  . Hydrocodone-Acetaminophen     Not allergic or intolerant-just watchful of medication  . Hydrocortisone Hives    Flea bites were mistaken as rash from hydrocortisone  . Lovastatin Other (See Comments)    forgetfullness,  . Moxifloxacin     Unknown reaction  . Sulfonamide Derivatives     itching  . Vancomycin     Rash from cream with vancomycin    Patient Measurements: Height: 5\' 2"  (157.5 cm) Weight: 119 lb 11.4 oz (54.3 kg) IBW/kg (Calculated) : 50.1  Vital Signs: Temp: 97.3 F (36.3 C) (05/09 0505) Temp src: Axillary (05/09 0505) BP: 125/48 mmHg (05/09 0505) Pulse Rate: 59 (05/09 0505) Intake/Output from previous day: 05/08 0701 - 05/09 0700 In: 600 [P.O.:600] Out: -  Intake/Output from this shift:    Labs:  Recent Labs  Apr 09, 2013 1100 03/12/13 0433 03/13/13 0353  WBC 26.4* 25.0* 25.4*  HGB 12.2 12.3 10.6*  PLT 236 248 228  CREATININE 0.64 0.66 0.76   Estimated Creatinine Clearance: 39.9 ml/min (by C-G formula based on Cr of 0.76). No results found for this basename: VANCOTROUGH, Leodis Binet, VANCORANDOM, GENTTROUGH, GENTPEAK, GENTRANDOM, TOBRATROUGH, TOBRAPEAK, TOBRARND, AMIKACINPEAK, AMIKACINTROU, AMIKACIN,  in the last 72 hours   Microbiology: Recent Results (from the past 720 hour(s))  CULTURE, EXPECTORATED SPUTUM-ASSESSMENT     Status: None   Collection Time    03/12/13 12:40 PM      Result Value Range Status   Specimen Description SPUTUM   Final   Special Requests Normal   Final   Sputum evaluation     Final   Value: THIS SPECIMEN IS ACCEPTABLE. RESPIRATORY CULTURE REPORT TO FOLLOW.   Report Status 03/12/2013 FINAL   Final  CULTURE, RESPIRATORY (NON-EXPECTORATED)     Status: None   Collection Time    03/12/13 12:40 PM      Result Value Range Status   Specimen Description SPUTUM   Final   Special Requests NONE   Final   Gram Stain     Final   Value: ABUNDANT WBC PRESENT,BOTH PMN AND MONONUCLEAR     NO SQUAMOUS EPITHELIAL CELLS SEEN     ABUNDANT GRAM NEGATIVE RODS   Culture ABUNDANT GRAM NEGATIVE RODS   Final   Report Status PENDING   Incomplete    Medical History: Past Medical History  Diagnosis Date  . Asthmatic bronchitis   . Allergic rhinitis   . Atrial fibrillation   . History of aortic valve replacement Dr. Tyrone Sage 2006    Has severe residual gradient and will be managed conservatively  . Sleep apnea   . Chronic obstructive asthma   . Osteoporosis   . Pruritic disorder   . Diverticulosis   . Hemorrhoids   . GERD (gastroesophageal reflux disease)   . IBS (irritable bowel syndrome)   . Pacemaker 2010  . Oxygen dependent   . Atrial flutter     on Betapace  . High risk medication use     on Betapace & Coumadin  .  Chronic anticoagulation   . COPD (chronic obstructive pulmonary disease)   . Pneumonia   . Anemia   . Arthritis   . Hypertension   . Coronary artery disease   . Pneumonia 09/2011    X 10 Sep 2011- June  2013  . Complication of anesthesia     trouble waking up  . CHF (congestive heart failure)   . Hospice care patient   . Anxiety and depression 03/12/2013    Medications:  Scheduled:  . ALPRAZolam  0.25 mg Oral QHS  . azelastine  2 spray Each Nare QHS   And  . fluticasone  2 spray Each Nare QHS  . digoxin  125 mcg Oral QHS  . diphenhydrAMINE  25 mg Intravenous Once  . enoxaparin (LOVENOX) injection  30 mg Subcutaneous Q24H  . ezetimibe  10 mg Oral QHS  . feeding supplement  1 Container Oral TID BM  . ferrous sulfate  325 mg Oral Q breakfast  .  furosemide  40 mg Oral BID  . guaiFENesin  600 mg Oral BID  . insulin aspart  0-15 Units Subcutaneous TID WC  . insulin aspart  0-5 Units Subcutaneous QHS  . ipratropium  0.5 mg Nebulization Q4H  . [COMPLETED] ketorolac  15 mg Intravenous Once  . levalbuterol  0.63 mg Nebulization Q4H  . methylPREDNISolone (SOLU-MEDROL) injection  60 mg Intravenous Q8H  . metoCLOPramide (REGLAN) injection  10 mg Intravenous Once  . metoCLOPramide  5 mg Oral TID WC & HS  . montelukast  10 mg Oral QHS  . multivitamin with minerals  1 tablet Oral Daily  . omega-3 acid ethyl esters  1 g Oral BID  . pantoprazole  80 mg Oral Daily  . polyethylene glycol  17 g Oral Daily  . pyridoxine  200 mg Oral Daily  . senna  2 tablet Oral q morning - 10a  . senna  3 tablet Oral QHS  . sertraline  50 mg Oral Daily  . [COMPLETED] sodium polystyrene  15 g Oral Once  . sodium polystyrene  30 g Oral Once  . sotalol  80 mg Oral BID  . [DISCONTINUED] azithromycin  500 mg Intravenous Q24H  . [DISCONTINUED] cefTRIAXone (ROCEPHIN)  IV  1 g Intravenous Q24H  . [DISCONTINUED] potassium chloride SA  20 mEq Oral BID   Infusions:    Assessment: 79 yo female admitted with SOB and cough with PMH of aortic stenosis secondary to HF and end stage COPD on hospice. Patient with hx pseudomonas in sputum now with GNR in sputum per newest culture  Start Zosyn per pharmacy pending final cx results  Elevated WBC  Goal of Therapy:  eradication of infection  Plan:  1) Zosyn 3.375g IV q8 (extended interval infusion) for CrCl > 20 ml/min 2) Will monitor for needed dosing changes   Hessie Knows, PharmD, BCPS Pager [phone] 03/13/2013 10:47 AM

## 2013-03-13 NOTE — Progress Notes (Signed)
Room  1337 - [name] - [facility] Trans[facility] RN Visit-R.Haaris Metallo RN  Related admission to [facility] diagnosis of COPD.  Pt is DNR code.    Pt alert & oriented, sitting upright in bed, with no  complaints of pain but difficulty breathing and general not feeling well.  Pt appears pale and weak.  Pvt pd caregiver present and feeding pt cereal for breakfast.  No other family present. Patient's home medication list is on shadow chart.   Please call [facility] @ [phone]- ask for RN Liaison or after hours,ask for on-call RN with any hospice needs.   Thank you.  Joneen Boers, RN  [facility]  Hospice Liaison

## 2013-03-13 NOTE — Progress Notes (Signed)
[facility]: Sw note: 03/13/13  Sw arrived at Pt's room and she was crying in bed, stating "I feel terrible, it's bad." Rn did give Pt medication to help with her anxiety and Pt did calm down. Pt has hospice services at home where she receives Rn/Sw support in the home. Her main complaint continues to be her cough and her feeling of "I am choking." Pt did feed herself some cereal but began coughing and stopped. Pt stated that she is weak from her cough and so tired.  Pt plans to return to her home at discharge with 24-hour private duty caregivers which have been in place for over a year. Sw provided active/reflective listening, emotional support during visit and will continue to follow for support during hospital stay.  Lorain Childes, LCSW, ACHP-Sw contact # [phone]

## 2013-03-13 NOTE — ED Provider Notes (Signed)
Medical screening examination/treatment/procedure(s) were performed by non-physician practitioner and as supervising physician I was immediately available for consultation/collaboration.  Veva Grimley R. Yaritzel Stange, MD 03/13/13 0952 

## 2013-03-14 LAB — CBC
HCT: 35.8 % — ABNORMAL LOW (ref 36.0–46.0)
Hemoglobin: 11.2 g/dL — ABNORMAL LOW (ref 12.0–15.0)
MCH: 27.1 pg (ref 26.0–34.0)
MCHC: 31.3 g/dL (ref 30.0–36.0)
MCV: 86.7 fL (ref 78.0–100.0)
RDW: 14.7 % (ref 11.5–15.5)

## 2013-03-14 LAB — BASIC METABOLIC PANEL
BUN: 19 mg/dL (ref 6–23)
CO2: 40 mEq/L (ref 19–32)
Chloride: 87 mEq/L — ABNORMAL LOW (ref 96–112)
Creatinine, Ser: 0.57 mg/dL (ref 0.50–1.10)
GFR calc Af Amer: >90 mL/min (ref >90)
Glucose, Bld: 156 mg/dL — ABNORMAL HIGH (ref 70–99)
Potassium: 4.2 mEq/L (ref 3.5–5.1)

## 2013-03-14 LAB — GLUCOSE, CAPILLARY
Glucose-Capillary: 148 mg/dL — ABNORMAL HIGH (ref 70–99)
Glucose-Capillary: 107 mg/dL — ABNORMAL HIGH (ref 70–99)
Glucose-Capillary: 164 mg/dL — ABNORMAL HIGH (ref 70–99)

## 2013-03-14 MED ORDER — ALPRAZOLAM 0.5 MG PO TABS
0.5000 mg | ORAL_TABLET | ORAL | Status: DC | PRN
  Administered 2013-03-14 – 2013-03-16 (×10): 0.5 mg via ORAL
  Filled 2013-03-14 (×11): qty 1

## 2013-03-14 MED ORDER — IPRATROPIUM BROMIDE 0.02 % IN SOLN
0.5000 mg | Freq: Four times a day (QID) | RESPIRATORY_TRACT | Status: DC
  Administered 2013-03-15 – 2013-03-16 (×7): 0.5 mg via RESPIRATORY_TRACT
  Filled 2013-03-14 (×7): qty 2.5

## 2013-03-14 MED ORDER — LEVALBUTEROL HCL 0.63 MG/3ML IN NEBU
0.6300 mg | INHALATION_SOLUTION | Freq: Four times a day (QID) | RESPIRATORY_TRACT | Status: DC
  Administered 2013-03-15 – 2013-03-16 (×7): 0.63 mg via RESPIRATORY_TRACT
  Filled 2013-03-14 (×10): qty 3

## 2013-03-14 NOTE — Progress Notes (Signed)
DNR.  Related admission.  Patient in the bed sleeping soundly.  No family or sitters present at bedside.  Chart reviewed and spoke with Huntley Dec, RN for patient.  Patient continues to have issues with anxiety and is taking morphine to assist with SOB and anxiety.  Please contact [facility] [phone] with any questions or concerns.   Willette Pa, RN [facility]

## 2013-03-14 NOTE — Progress Notes (Signed)
[facility] PROGRESS NOTE  [name] ZOX:[id number] DOB: 24-Jul-1924 DOA: 04/01/2013 PCP: [name], MD  Brief narrative: [name] is an 79 y.o. female with a PMH of aortic stenosis, secondary heart failure from end stage COPD on hospice who presented to the emergency room on 04/01/2013 with 1-2 weeks of progressively worsening shortness of breath, cough and fatigue.  Initial evaluation in the emergency room revealed the patient's sodium to be 126, BNP of 687 and a white count of 26. Chest x-ray was unremarkable. Patient was given nebulizers and she was noted to have oxygen saturations of 93-94% on 4/2 L of oxygen which is her baseline at home. Sputum culture with abundant GNR, now on empiric Zosyn.  Assessment/Plan: Principal Problem:   COPD exacerbation with acute on chronic respiratory failure secondary to healthcare associated pneumonia, GNR in sputum (suspected pseudomonas) -Empirically being treated with empiric IV steroids, nebulized bronchodilator therapy, supplemental oxygen, and empiric antibiotics (switched to Zosyn 03/13/13 secondary to GNR in sputum). WBC improving but no apparent clinical improvement yet. -Continue Mucinex. -Note: Dulera and Spiriva on hold. Active Problems:   Atrial fibrillation -Maintaining normal sinus rhythm. Continue digoxin and sotalol.   Aortic stenosis / pacemaker -Watch for heart failure. No current maintenance IV fluids ordered.   Diastolic CHF, acute on chronic -Diurese as tolerated. Current dose of Lasix is double her home dose. Watch renal function. Renal function stable today.   Leukocytosis -Monitor white blood cell count. Maybe secondary to steroids versus infection. -Slowly improving.   Hyponatremia -Likely SIADH from lung process. Trending up.   Hyperkalemia -Corrected with kayexalate.   Steroid-induced hyperglycemia -Continue SSI, moderate scale. CBG 150-327.   Depression/anxiety -Continue Xanax and Zoloft.  Xanax  dose increased secondary to severe anxiety.   Dyslipidemia -Continue Zetia and fish oil.  Code Status: DNR Family Communication: Family at bedside. Disposition Plan: Home when stable.   Medical Consultants:  None.  Other Consultants:  None.  Anti-infectives:  Rocephin 03/16/2013---> 03/13/2013  Azithromycin 04/02/2013---> 03/13/2013  Zosyn 03/13/2013--->  HPI/Subjective: [name] is still quite dyspneic and weak. Her bowels have not moved. Appetite is poor. No chest pain.  Very anxious at times, telling her family that she "wants to die".  Objective: Filed Vitals:   03/13/13 1510 03/13/13 2100 03/14/13 0500 03/14/13 0838  BP:  139/51 105/60   Pulse:  70 73   Temp:  97.4 F (36.3 C) 97.8 F (36.6 C)   TempSrc:  Oral Oral   Resp:  22 20   Height:      Weight:      SpO2: 97% 100% 96% 91%   No intake or output data in the 24 hours ending 03/14/13 1101  Exam: Gen:  NAD Cardiovascular:  RRR, No M/R/G Respiratory:  Lungs with expiratory wheezes throughout, crackles in the bases. Gastrointestinal:  Abdomen softly distended. Positive bowel sounds. Extremities:  No C/E/C  Data Reviewed:  Basic Metabolic Panel:  Recent Labs Lab 03/08/2013 1100 03/12/13 0433 03/13/13 0353 03/14/13 0420  NA 126* 126* 125* 131*  K 4.5 5.3* 5.9* 4.2  CL 84* 84* 84* 87*  CO2 33* 35* 33* 40*  GLUCOSE 167* 180* 172* 156*  BUN 23 21 26* 19  CREATININE 0.64 0.66 0.76 0.57  CALCIUM 9.5 9.7 9.4 9.2   GFR Estimated Creatinine Clearance: 39.9 ml/min (by C-G formula based on Cr of 0.57).  CBC:  Recent Labs Lab 04/02/2013 1100 03/12/13 0433 03/13/13 0353 03/14/13 0420  WBC 26.4* 25.0* 25.4*  18.4*  NEUTROABS 24.3*  --   --   --   HGB 12.2 12.3 10.6* 11.2*  HCT 37.6 39.7 34.4* 35.8*  MCV 85.1 85.6 86.0 86.7  PLT 236 248 228 262   CBG:  Recent Labs Lab 03/13/13 0752 03/13/13 1149 03/13/13 1725 03/13/13 2209 03/14/13 0734  GLUCAP 160* 203* 150* 152* 148*    Microbiology Recent Results (from the past 240 hour(s))  CULTURE, EXPECTORATED SPUTUM-ASSESSMENT     Status: None   Collection Time    03/12/13 12:40 PM      Result Value Range Status   Specimen Description SPUTUM   Final   Special Requests Normal   Final   Sputum evaluation     Final   Value: THIS SPECIMEN IS ACCEPTABLE. RESPIRATORY CULTURE REPORT TO FOLLOW.   Report Status 03/12/2013 FINAL   Final  CULTURE, RESPIRATORY (NON-EXPECTORATED)     Status: None   Collection Time    03/12/13 12:40 PM      Result Value Range Status   Specimen Description SPUTUM   Final   Special Requests NONE   Final   Gram Stain     Final   Value: ABUNDANT WBC PRESENT,BOTH PMN AND MONONUCLEAR     NO SQUAMOUS EPITHELIAL CELLS SEEN     ABUNDANT GRAM NEGATIVE RODS   Culture ABUNDANT GRAM NEGATIVE RODS   Final   Report Status PENDING   Incomplete      Procedures and Diagnostic Studies: Dg Chest 2 View  03/16/2013  *RADIOLOGY REPORT*  Clinical Data: Cough and chronic lung disease  CHEST - 2 VIEW  Comparison: 01/23/2013  Findings: The cardiac shadow is stable.  A pacing device is again seen.  Persistent changes are noted in the right mid lung stable from prior study.  No new focal abnormality is seen.  Chronic compression deformities in the lower thoracic spine are again noted.  IMPRESSION: Chronic changes in the right. No acute abnormality is seen.   Original Report Authenticated By: Alcide Clever, M.D.     Scheduled Meds: . azelastine  2 spray Each Nare QHS   And  . fluticasone  2 spray Each Nare QHS  . digoxin  125 mcg Oral QHS  . diphenhydrAMINE  25 mg Intravenous Once  . enoxaparin (LOVENOX) injection  40 mg Subcutaneous Q24H  . ezetimibe  10 mg Oral QHS  . feeding supplement  1 Container Oral TID BM  . ferrous sulfate  325 mg Oral Q breakfast  . furosemide  40 mg Oral BID  . guaiFENesin  600 mg Oral BID  . insulin aspart  0-15 Units Subcutaneous TID WC  . insulin aspart  0-5 Units Subcutaneous  QHS  . ipratropium  0.5 mg Nebulization Q4H  . levalbuterol  0.63 mg Nebulization Q4H  . methylPREDNISolone (SOLU-MEDROL) injection  60 mg Intravenous Q8H  . metoCLOPramide (REGLAN) injection  10 mg Intravenous Once  . metoCLOPramide  5 mg Oral TID WC & HS  . montelukast  10 mg Oral QHS  . multivitamin with minerals  1 tablet Oral Daily  . omega-3 acid ethyl esters  1 g Oral BID  . pantoprazole  80 mg Oral Daily  . piperacillin-tazobactam (ZOSYN)  IV  3.375 g Intravenous Q8H  . polyethylene glycol  17 g Oral Daily  . pyridoxine  200 mg Oral Daily  . senna  2 tablet Oral q morning - 10a  . senna  3 tablet Oral QHS  . sertraline  50 mg  Oral Daily  . sotalol  80 mg Oral BID   Continuous Infusions:   Time spent: 35 minutes.   LOS: 3 days   Oluwaseyi Tull  [facility] Pager [phone].  If 8PM-8AM, please contact night-coverage at [email or web address], password [facility] 03/14/2013, 11:01 AM

## 2013-03-14 NOTE — Progress Notes (Signed)
CRITICAL VALUE ALERT  Critical value received:  CO2 of 40  Date of notification:  03/14/13  Time of notification:  0505  Critical value read back:yes  Nurse who received alert:  Oneita Jolly, RN  MD notified (1st page):  M. Burnadette Peter, NP  Time of first page:  0505  MD notified (2nd page):  Time of second page:  Responding MD:  M. Burnadette Peter, NP (Aknowledged, no new orders at this time.)  Time MD responded:  [phone]

## 2013-03-14 NOTE — Progress Notes (Signed)
Pt remains dyspneic at rest. IV morphine provides effective management of air hunger.  When pt starts displaying sx of anxiety medical intervention must be rapid as pt escalates to near hysterical within minutes. Pt has intermittent confusion but can be reoriented when her dog zeke is mentioned.

## 2013-03-15 LAB — CBC
MCHC: 30.1 g/dL (ref 30.0–36.0)
RDW: 14.5 % (ref 11.5–15.5)

## 2013-03-15 LAB — BASIC METABOLIC PANEL
BUN: 17 mg/dL (ref 6–23)
Creatinine, Ser: 0.55 mg/dL (ref 0.50–1.10)
GFR calc Af Amer: >90 mL/min (ref >90)
GFR calc non Af Amer: 83 mL/min — ABNORMAL LOW (ref >90)
Potassium: 3.2 mEq/L — ABNORMAL LOW (ref 3.5–5.1)

## 2013-03-15 MED ORDER — FUROSEMIDE 10 MG/ML IJ SOLN
20.0000 mg | Freq: Two times a day (BID) | INTRAMUSCULAR | Status: DC
  Administered 2013-03-15 – 2013-03-17 (×4): 20 mg via INTRAVENOUS
  Filled 2013-03-15 (×6): qty 2

## 2013-03-15 MED ORDER — POTASSIUM CHLORIDE 10 MEQ/100ML IV SOLN
10.0000 meq | INTRAVENOUS | Status: AC
  Administered 2013-03-15 (×3): 10 meq via INTRAVENOUS
  Filled 2013-03-15 (×3): qty 100

## 2013-03-15 MED ORDER — SODIUM CHLORIDE 0.9 % IV SOLN
250.0000 mg | Freq: Four times a day (QID) | INTRAVENOUS | Status: DC
  Administered 2013-03-15 – 2013-03-16 (×5): 250 mg via INTRAVENOUS
  Filled 2013-03-15 (×7): qty 250

## 2013-03-15 MED ORDER — PANTOPRAZOLE SODIUM 40 MG IV SOLR
40.0000 mg | INTRAVENOUS | Status: DC
  Administered 2013-03-15 – 2013-03-16 (×2): 40 mg via INTRAVENOUS
  Filled 2013-03-15 (×2): qty 40

## 2013-03-15 MED ORDER — BISACODYL 10 MG RE SUPP: 10.0000 mg | Freq: Every day | RECTAL | Status: DC | PRN

## 2013-03-15 NOTE — Progress Notes (Signed)
CRITICAL VALUE ALERT  Critical value received:  CO2 43 Date of notification: 03/15/13  Time of notification:  0509  Critical value read back:yes  Nurse who received alert:  Redmond Pulling  MD notified (1st page):  Daphane Shepherd  Time of first page:  0510  MD notified (2nd page):  Time of second page:  Responding MD:  Daphane Shepherd  Time MD responded:  [phone]  No New orders

## 2013-03-15 NOTE — Progress Notes (Signed)
RM 1337   [name]                [facility] SW Note:  Weekend on-call visit due to pt's GIP status. Met with pt's oldest dtr outside of the room. She shared that pt remains extremely anxious and has voiced that she is ready to die. Family intend to meet with MD to further discuss pt's status and necessity of antibiotics. Dtr does not want to prolong the inevitable particularly if pt is ready to die. Explained to dtr that the primary hospice MSW will follow up to extend support. Althia Forts, LCSW

## 2013-03-15 NOTE — Progress Notes (Addendum)
[facility] PROGRESS NOTE  [name] GNF:[id number] DOB: 1924/04/17 DOA: 03/29/2013 PCP: [name], MD  Brief narrative: [name] is an 79 y.o. female with a PMH of aortic stenosis, secondary heart failure from end stage COPD on hospice who presented to the emergency room on 04/02/2013 with 1-2 weeks of progressively worsening shortness of breath, cough and fatigue.  Initial evaluation in the emergency room revealed the patient's sodium to be 126, BNP of 687 and a white count of 26. Chest x-ray was unremarkable. Patient was given nebulizers and she was noted to have oxygen saturations of 93-94% on 4/2 L of oxygen which is her baseline at home. Sputum culture with abundant GNR, now on empiric Zosyn.  Assessment/Plan: Principal Problem:   COPD exacerbation with acute on chronic respiratory failure secondary to healthcare associated pseudomonal pneumonia -Empirically being treated with empiric IV steroids, nebulized bronchodilator therapy, supplemental oxygen, and empiric antibiotics (switched to Zosyn 03/13/13 secondary to GNR in sputum). WBC improving but no apparent clinical improvement yet. -Continue Mucinex. -Note: Dulera and Spiriva on hold. -30 minute family meeting held with all 3 daughters to discuss goals of care, prognosis, etc.  At this time, family leaning toward comfort care, but wish an additional 24 hours of antibiotic therapy to see if switch to more effective antibiotic (given sensitivity data on sputum culture) will have any impact in her condition. Active Problems:   Atrial fibrillation -Maintaining normal sinus rhythm. Continue digoxin and sotalol.   Aortic stenosis / pacemaker -Watch for heart failure. No current maintenance IV fluids ordered. Change Lasix to IV route.   Diastolic CHF, acute on chronic -Diurese as tolerated. Current dose of Lasix is double her home dose. Watch renal function. Renal function stable today. -Change Lasix to IV route.    Leukocytosis -Monitor white blood cell count. Maybe secondary to steroids versus infection. -Slowly improving.   Hyponatremia -Likely SIADH from lung process. Trending up.   Hyperkalemia / hypokalemia -Hyperkalemia corrected with kayexalate. Potassium now low.  Will give K runs x 3 today.   Steroid-induced hyperglycemia -Discontinue empiric treatment given overall prognosis and desire to focus on comfort measures.   Depression/anxiety -Continue Xanax and Zoloft.  Xanax dose increased secondary to severe anxiety.   Dyslipidemia -Stop Zetia and fish oil given overall prognosis.  Code Status: DNR Family Communication: Family at bedside. Disposition Plan: Home when stable.   Medical Consultants:  None.  Other Consultants:  None.  Anti-infectives:  Rocephin 03/18/2013---> 03/13/2013  Azithromycin 04/03/2013---> 03/13/2013  Zosyn 03/13/2013--->  HPI/Subjective: [name] is still quite dyspneic and weak. Continues to tell her family that she "wants to die ". Currently sedated on Xanax. Discussion held with 2 of her daughters about transitioning towards comfort care. A third daughter is arriving from out of town today and they will discuss changing the focus of care to full comfort measures.  Objective: Filed Vitals:   03/14/13 1607 03/14/13 1934 03/14/13 2100 03/15/13 0500  BP:   120/63 103/57  Pulse:   78 60  Temp:   97.5 F (36.4 C) 97.4 F (36.3 C)  TempSrc:   Oral Oral  Resp:   20 16  Height:      Weight:      SpO2: 98% 97% 99% 96%    Intake/Output Summary (Last 24 hours) at 03/15/13 0728 Last data filed at 03/15/13 0500  Gross per 24 hour  Intake    390 ml  Output    750 ml  Net   -  360 ml    Exam: Gen:  NAD Cardiovascular:  RRR, No M/R/G Respiratory:  Lungs with expiratory wheezes throughout, crackles in the bases. Gastrointestinal:  Abdomen softly distended. Positive bowel sounds. Extremities:  No C/E/C  Data Reviewed:  Basic Metabolic  Panel:  Recent Labs Lab 04-02-13 1100 03/12/13 0433 03/13/13 0353 03/14/13 0420 03/15/13 0421  NA 126* 126* 125* 131* 133*  K 4.5 5.3* 5.9* 4.2 3.2*  CL 84* 84* 84* 87* 86*  CO2 33* 35* 33* 40* 43*  GLUCOSE 167* 180* 172* 156* 172*  BUN 23 21 26* 19 17  CREATININE 0.64 0.66 0.76 0.57 0.55  CALCIUM 9.5 9.7 9.4 9.2 8.9   GFR Estimated Creatinine Clearance: 39.9 ml/min (by C-G formula based on Cr of 0.55).  CBC:  Recent Labs Lab 2013-04-02 1100 03/12/13 0433 03/13/13 0353 03/14/13 0420 03/15/13 0421  WBC 26.4* 25.0* 25.4* 18.4* 17.9*  NEUTROABS 24.3*  --   --   --   --   HGB 12.2 12.3 10.6* 11.2* 10.8*  HCT 37.6 39.7 34.4* 35.8* 35.9*  MCV 85.1 85.6 86.0 86.7 86.3  PLT 236 248 228 262 272   CBG:  Recent Labs Lab 03/13/13 2209 03/14/13 0734 03/14/13 1139 03/14/13 1701 03/14/13 2155  GLUCAP 152* 148* 214* 107* 164*   Microbiology Recent Results (from the past 240 hour(s))  CULTURE, EXPECTORATED SPUTUM-ASSESSMENT     Status: None   Collection Time    03/12/13 12:40 PM      Result Value Range Status   Specimen Description SPUTUM   Final   Special Requests Normal   Final   Sputum evaluation     Final   Value: THIS SPECIMEN IS ACCEPTABLE. RESPIRATORY CULTURE REPORT TO FOLLOW.   Report Status 03/12/2013 FINAL   Final  CULTURE, RESPIRATORY (NON-EXPECTORATED)     Status: None   Collection Time    03/12/13 12:40 PM      Result Value Range Status   Specimen Description SPUTUM   Final   Special Requests NONE   Final   Gram Stain     Final   Value: ABUNDANT WBC PRESENT,BOTH PMN AND MONONUCLEAR     NO SQUAMOUS EPITHELIAL CELLS SEEN     ABUNDANT GRAM NEGATIVE RODS   Culture ABUNDANT PSEUDOMONAS AERUGINOSA   Final   Report Status PENDING   Incomplete      Procedures and Diagnostic Studies: Dg Chest 2 View  2013/04/02  *RADIOLOGY REPORT*  Clinical Data: Cough and chronic lung disease  CHEST - 2 VIEW  Comparison: 01/23/2013  Findings: The cardiac shadow is stable.   A pacing device is again seen.  Persistent changes are noted in the right mid lung stable from prior study.  No new focal abnormality is seen.  Chronic compression deformities in the lower thoracic spine are again noted.  IMPRESSION: Chronic changes in the right. No acute abnormality is seen.   Original Report Authenticated By: Alcide Clever, M.D.     Scheduled Meds: . azelastine  2 spray Each Nare QHS   And  . fluticasone  2 spray Each Nare QHS  . digoxin  125 mcg Oral QHS  . diphenhydrAMINE  25 mg Intravenous Once  . enoxaparin (LOVENOX) injection  40 mg Subcutaneous Q24H  . ezetimibe  10 mg Oral QHS  . feeding supplement  1 Container Oral TID BM  . ferrous sulfate  325 mg Oral Q breakfast  . furosemide  40 mg Oral BID  . guaiFENesin  600 mg  Oral BID  . insulin aspart  0-15 Units Subcutaneous TID WC  . insulin aspart  0-5 Units Subcutaneous QHS  . ipratropium  0.5 mg Nebulization Q6H  . levalbuterol  0.63 mg Nebulization Q6H  . methylPREDNISolone (SOLU-MEDROL) injection  60 mg Intravenous Q8H  . metoCLOPramide (REGLAN) injection  10 mg Intravenous Once  . metoCLOPramide  5 mg Oral TID WC & HS  . montelukast  10 mg Oral QHS  . multivitamin with minerals  1 tablet Oral Daily  . omega-3 acid ethyl esters  1 g Oral BID  . pantoprazole  80 mg Oral Daily  . piperacillin-tazobactam (ZOSYN)  IV  3.375 g Intravenous Q8H  . polyethylene glycol  17 g Oral Daily  . pyridoxine  200 mg Oral Daily  . senna  2 tablet Oral q morning - 10a  . senna  3 tablet Oral QHS  . sertraline  50 mg Oral Daily  . sotalol  80 mg Oral BID   Continuous Infusions:   Time spent: 35 minutes.   LOS: 4 days   Tane Biegler  [facility] Pager [phone].  If 8PM-8AM, please contact night-coverage at [email or web address], password [facility] 03/15/2013, 7:28 AM

## 2013-03-15 NOTE — Progress Notes (Signed)
ANTIBIOTIC CONSULT NOTE - INITIAL  Pharmacy Consult for Primaxin Indication: Pseudomonas Pneumonia  Allergies  Allergen Reactions  . Cephalexin     Unsure as to reaction.  ? Hives with Cephalexin; "I can take Penicillin"  . Atorvastatin Other (See Comments)    Forgetfulness   . Azithromycin Nausea And Vomiting  . Codeine Nausea And Vomiting  . Crestor (Rosuvastatin Calcium)     Forgetfulness    . Estradiol     Unknown reaction  . Hydrocodone-Acetaminophen     Not allergic or intolerant-just watchful of medication  . Hydrocortisone Hives    Flea bites were mistaken as rash from hydrocortisone  . Lovastatin Other (See Comments)    forgetfullness,  . Moxifloxacin     Unknown reaction  . Sulfonamide Derivatives     itching  . Vancomycin     Rash from cream with vancomycin    Patient Measurements: Height: 5\' 2"  (157.5 cm) Weight: 119 lb 11.4 oz (54.3 kg) IBW/kg (Calculated) : 50.1  Vital Signs: Temp: 97.4 F (36.3 C) (05/11 0500) Temp src: Oral (05/11 0500) BP: 103/57 mmHg (05/11 0500) Pulse Rate: 60 (05/11 0500) Intake/Output from previous day: 05/10 0701 - 05/11 0700 In: 390 [P.O.:390] Out: 750 [Urine:750] Intake/Output from this shift: Total I/O In: -  Out: 200 [Urine:200]  Labs:  Recent Labs  03/13/13 0353 03/14/13 0420 03/15/13 0421  WBC 25.4* 18.4* 17.9*  HGB 10.6* 11.2* 10.8*  PLT 228 262 272  CREATININE 0.76 0.57 0.55   Microbiology: Recent Results (from the past 720 hour(s))  CULTURE, EXPECTORATED SPUTUM-ASSESSMENT     Status: None   Collection Time    03/12/13 12:40 PM      Result Value Range Status   Specimen Description SPUTUM   Final   Special Requests Normal   Final   Sputum evaluation     Final   Value: THIS SPECIMEN IS ACCEPTABLE. RESPIRATORY CULTURE REPORT TO FOLLOW.   Report Status 03/12/2013 FINAL   Final  CULTURE, RESPIRATORY (NON-EXPECTORATED)     Status: None   Collection Time    03/12/13 12:40 PM      Result Value Range  Status   Specimen Description SPUTUM   Final   Special Requests NONE   Final   Gram Stain     Final   Value: ABUNDANT WBC PRESENT,BOTH PMN AND MONONUCLEAR     NO SQUAMOUS EPITHELIAL CELLS SEEN     ABUNDANT GRAM NEGATIVE RODS   Culture ABUNDANT PSEUDOMONAS AERUGINOSA   Final   Report Status PENDING   Incomplete   Organism ID, Bacteria PSEUDOMONAS AERUGINOSA   Final      Assessment: 79 yo female admitted with SOB and cough with PMH of aortic stenosis secondary to HF and end stage COPD on hospice. Patient with hx pseudomonas in sputum now with pseudomonas growing again per newest culture.  Patient initially started on empiric Zosyn.  Lab reports that Zosyn is intermediate to current culture and requires another test to check for susceptibility, should result tomorrow.  Switching therapy to Primaxin since culture sensitive.  Pt has allergy listed to Cephalexin (unsure of reaction) but can take penicillins.  Renal function stable. SCr 0.55, CrCl ~56 ml/min (normalized, using SCr 0.8 for age).  Weight = 54 kg  Goal of Therapy:  Doses adjusted per renal clearance, eradication of infection  Plan:  Primaxin 250 mg IV q6h.  Clance Boll 03/15/2013,11:55 AM

## 2013-03-16 LAB — CULTURE, RESPIRATORY W GRAM STAIN

## 2013-03-16 LAB — BASIC METABOLIC PANEL
Calcium: 9 mg/dL (ref 8.4–10.5)
GFR calc non Af Amer: >90 mL/min (ref >90)
Sodium: 130 mEq/L — ABNORMAL LOW (ref 135–145)

## 2013-03-16 LAB — CBC
MCH: 27 pg (ref 26.0–34.0)
MCHC: 31.4 g/dL (ref 30.0–36.0)
Platelets: 293 10*3/uL (ref 150–400)

## 2013-03-16 MED ORDER — ATROPINE SULFATE 1 % OP SOLN
2.0000 [drp] | OPHTHALMIC | Status: DC | PRN
  Administered 2013-03-16 – 2013-03-17 (×3): 2 [drp] via SUBLINGUAL
  Filled 2013-03-16: qty 2

## 2013-03-16 MED ORDER — LORAZEPAM 2 MG/ML IJ SOLN
INTRAMUSCULAR | Status: AC
  Administered 2013-03-16: 1 mg via INTRAVENOUS
  Filled 2013-03-16: qty 1

## 2013-03-16 MED ORDER — LORAZEPAM 2 MG/ML IJ SOLN
1.0000 mg | INTRAMUSCULAR | Status: DC | PRN
  Administered 2013-03-17: 1 mg via INTRAVENOUS
  Filled 2013-03-16 (×2): qty 1

## 2013-03-16 MED ORDER — LORAZEPAM 2 MG/ML IJ SOLN
0.3000 mg/h | INTRAVENOUS | Status: DC
  Administered 2013-03-16: 0.3 mg/h via INTRAVENOUS
  Filled 2013-03-16: qty 25

## 2013-03-16 MED ORDER — METHYLPREDNISOLONE SODIUM SUCC 125 MG IJ SOLR
60.0000 mg | Freq: Two times a day (BID) | INTRAMUSCULAR | Status: DC
  Filled 2013-03-16 (×2): qty 0.96

## 2013-03-16 MED ORDER — ACETAMINOPHEN 650 MG RE SUPP: 325.0000 mg | RECTAL | Status: DC | PRN

## 2013-03-16 NOTE — Progress Notes (Signed)
Room 1337 - Trish Fountain - [facility] RN Visit-R.Greene Diodato RN  Related admission to [facility] diagnosis of COPD.   Pt is DNR code.    Pt sleeping soundly with open mouthed breathing.  Pt appears comfortable, lying semi upright in the bed. Private pd caregiver at bedside.   No other family present.  Patient's home medication list is on shadow chart.   Per chart notes - MD will meed with family today to determine continuing and future treatments.    Please call [facility] @ [phone]- ask for RN Liaison or after hours,ask for on-call RN with any hospice needs.   Thank you.  Joneen Boers, RN  [facility]  Hospice Liaison

## 2013-03-16 NOTE — Progress Notes (Signed)
[facility] [place]: Sw note  Pt was asleep in bed she appeared comfortable and in no distress, this Sw did not disturb Pt, . Private duty caregiver at bedside, stated that Pt continues to state that "I just want to die." Caregiver did state that Pt seems less anxious since medications have been adjusted. No family at bedside at this time. Sw provided active/reflective listening and supportive presence during visit. Sw will continue to follow for support during hospital stay.  [address] [place], [facility] [phone]

## 2013-03-16 NOTE — Progress Notes (Signed)
Dr Darnelle Catalan said that current IV can be "kept" as long as functioning.

## 2013-03-16 NOTE — Progress Notes (Addendum)
[facility] PROGRESS NOTE  WALLACE COGLIANO RUE:[id number] DOB: [birth year] DOA: 03/07/2013 PCP: [name], MD  Brief narrative: [name] is an 79 y.o. female with a PMH of aortic stenosis, secondary heart failure from end stage COPD on hospice who presented to the emergency room on 03/25/2013 with 1-2 weeks of progressively worsening shortness of breath, cough and fatigue.  Initial evaluation in the emergency room revealed the patient's sodium to be 126, BNP of 687 and a white count of 26. Chest x-ray was unremarkable. Patient was given nebulizers and she was noted to have oxygen saturations of 93-94% on 4/2 L of oxygen which is her baseline at home. Sputum culture with abundant GNR, now on empiric Zosyn.  The patient's condition has continued to deteriorate, and family transitioned to full comfort care 03/16/13.  Assessment/Plan: Principal Problem:   COPD exacerbation with acute on chronic respiratory failure secondary to healthcare associated pseudomonal pneumonia -Empirically being treated with empiric IV steroids, nebulized bronchodilator therapy, supplemental oxygen, and empiric antibiotics (switched to Zosyn 03/13/13 secondary to GNR in sputum). WBC improving but no apparent clinical improvement yet. We'll begin to wean steroids. -Continue Mucinex. -Note: Dulera and Spiriva on hold. - Followup with family today regarding goals of care and desire for further aggressive therapy with antibiotics. Active Problems:   Atrial fibrillation -Maintaining normal sinus rhythm. Continue digoxin and sotalol.   Aortic stenosis / pacemaker -Watch for heart failure. No current maintenance IV fluids ordered. Continue to diuresis tolerated.   Diastolic CHF, acute on chronic -Diurese as tolerated. Current dose of Lasix is double her home dose. Watch renal function. Renal function stable today. -Continue to administer Lasix through the intravenous route.   Leukocytosis -Monitor white blood cell  count. Maybe secondary to steroids versus infection. -Significantly improved from admission values.   Hyponatremia -Likely SIADH from lung process. Trending up.   Hyperkalemia / hypokalemia -Corrected.   Steroid-induced hyperglycemia -Discontinue empiric treatment given overall prognosis and desire to focus on comfort measures.   Depression/anxiety -Continue Xanax and Zoloft.  Xanax dose increased secondary to severe anxiety.   Dyslipidemia -Stop Zetia and fish oil given overall prognosis.  Code Status: DNR Family Communication: Family at bedside 03/15/2013. Disposition Plan: Home when stable.   Medical Consultants:  None.  Other Consultants:  None.  Anti-infectives:  Rocephin 03/18/2013---> 03/13/2013  Azithromycin 03/05/2013---> 03/13/2013  Zosyn 03/13/2013--->  HPI/Subjective: [name] is still very weak. She has a Comptroller at the bedside who states that she "wants to go home ". Very little by mouth intake. No appreciable improvement in her clinical appearance noted. Remains dyspneic but anxiety is better controlled with frequent dosing of Xanax.  Objective: Filed Vitals:   03/15/13 2013 03/15/13 2100 03/16/13 0500 03/16/13 0806  BP:  129/64 130/58   Pulse:  93 90   Temp:  97.1 F (36.2 C) 97.5 F (36.4 C)   TempSrc:  Oral Axillary   Resp:  20 20   Height:      Weight:      SpO2: 95% 96% 97% 98%    Intake/Output Summary (Last 24 hours) at 03/16/13 1054 Last data filed at 03/16/13 0700  Gross per 24 hour  Intake      0 ml  Output    350 ml  Net   -350 ml    Exam: Gen:  NAD Cardiovascular:  RRR, No M/R/G Respiratory:  Lungs clearer with an occasional wheeze. Gastrointestinal:  Abdomen softly distended. Positive bowel sounds. Extremities:  No C/E/C  Data Reviewed:  Basic Metabolic Panel:  Recent Labs Lab 03/12/13 0433 03/13/13 0353 03/14/13 0420 03/15/13 0421 03/16/13 0805  NA 126* 125* 131* 133* 130*  K 5.3* 5.9* 4.2 3.2* 3.8  CL 84* 84* 87*  86* 84*  CO2 35* 33* 40* 43* 43*  GLUCOSE 180* 172* 156* 172* 167*  BUN 21 26* 19 17 19   CREATININE 0.66 0.76 0.57 0.55 0.40*  CALCIUM 9.7 9.4 9.2 8.9 9.0   GFR Estimated Creatinine Clearance: 39.9 ml/min (by C-G formula based on Cr of 0.4).  CBC:  Recent Labs Lab 2013/03/18 1100 03/12/13 0433 03/13/13 0353 03/14/13 0420 03/15/13 0421 03/16/13 0805  WBC 26.4* 25.0* 25.4* 18.4* 17.9* 11.4*  NEUTROABS 24.3*  --   --   --   --   --   HGB 12.2 12.3 10.6* 11.2* 10.8* 11.3*  HCT 37.6 39.7 34.4* 35.8* 35.9* 36.0  MCV 85.1 85.6 86.0 86.7 86.3 86.1  PLT 236 248 228 262 272 293   CBG:  Recent Labs Lab 03/14/13 0734 03/14/13 1139 03/14/13 1701 03/14/13 2155 03/15/13 0815  GLUCAP 148* 214* 107* 164* 169*   Microbiology Recent Results (from the past 240 hour(s))  CULTURE, EXPECTORATED SPUTUM-ASSESSMENT     Status: None   Collection Time    03/12/13 12:40 PM      Result Value Range Status   Specimen Description SPUTUM   Final   Special Requests Normal   Final   Sputum evaluation     Final   Value: THIS SPECIMEN IS ACCEPTABLE. RESPIRATORY CULTURE REPORT TO FOLLOW.   Report Status 03/12/2013 FINAL   Final  CULTURE, RESPIRATORY (NON-EXPECTORATED)     Status: None   Collection Time    03/12/13 12:40 PM      Result Value Range Status   Specimen Description SPUTUM   Final   Special Requests NONE   Final   Gram Stain     Final   Value: ABUNDANT WBC PRESENT,BOTH PMN AND MONONUCLEAR     NO SQUAMOUS EPITHELIAL CELLS SEEN     ABUNDANT GRAM NEGATIVE RODS   Culture ABUNDANT PSEUDOMONAS AERUGINOSA   Final   Report Status 03/16/2013 FINAL   Final   Organism ID, Bacteria PSEUDOMONAS AERUGINOSA   Final      Procedures and Diagnostic Studies: Dg Chest 2 View  03-18-2013  *RADIOLOGY REPORT*  Clinical Data: Cough and chronic lung disease  CHEST - 2 VIEW  Comparison: 01/23/2013  Findings: The cardiac shadow is stable.  A pacing device is again seen.  Persistent changes are noted in the  right mid lung stable from prior study.  No new focal abnormality is seen.  Chronic compression deformities in the lower thoracic spine are again noted.  IMPRESSION: Chronic changes in the right. No acute abnormality is seen.   Original Report Authenticated By: Alcide Clever, M.D.     Scheduled Meds: . digoxin  125 mcg Oral QHS  . diphenhydrAMINE  25 mg Intravenous Once  . enoxaparin (LOVENOX) injection  40 mg Subcutaneous Q24H  . feeding supplement  1 Container Oral TID BM  . furosemide  20 mg Intravenous BID  . imipenem-cilastatin  250 mg Intravenous Q6H  . ipratropium  0.5 mg Nebulization Q6H  . levalbuterol  0.63 mg Nebulization Q6H  . methylPREDNISolone (SOLU-MEDROL) injection  60 mg Intravenous Q8H  . metoCLOPramide (REGLAN) injection  10 mg Intravenous Once  . metoCLOPramide  5 mg Oral TID WC & HS  . pantoprazole (PROTONIX)  IV  40 mg Intravenous Q24H  . sotalol  80 mg Oral BID   Continuous Infusions:   Time spent: 25 minutes.   LOS: 5 days   Ramandeep Arington  [facility] Pager [phone].  If 8PM-8AM, please contact night-coverage at [email or web address], password [facility] 03/16/2013, 10:54 AM

## 2013-03-17 MED ORDER — ALBUTEROL SULFATE (5 MG/ML) 0.5% IN NEBU
5.0000 mg | INHALATION_SOLUTION | Freq: Once | RESPIRATORY_TRACT | Status: AC
  Administered 2013-03-17: 5 mg via RESPIRATORY_TRACT
  Filled 2013-03-17: qty 1

## 2013-03-17 MED ORDER — MORPHINE SULFATE 2 MG/ML IJ SOLN
1.0000 mg | INTRAMUSCULAR | Status: DC | PRN
Start: 2013-03-17 — End: 2013-03-17
  Administered 2013-03-17: 2 mg via INTRAVENOUS
  Administered 2013-03-17: 4 mg via INTRAVENOUS
  Administered 2013-03-17 (×2): 2 mg via INTRAVENOUS
  Filled 2013-03-17 (×2): qty 1
  Filled 2013-03-17: qty 2

## 2013-03-17 MED ORDER — FUROSEMIDE 10 MG/ML IJ SOLN
20.0000 mg | Freq: Two times a day (BID) | INTRAMUSCULAR | Status: DC | PRN
  Filled 2013-03-17: qty 2

## 2013-03-17 MED ORDER — IPRATROPIUM BROMIDE 0.02 % IN SOLN
0.5000 mg | Freq: Once | RESPIRATORY_TRACT | Status: AC
  Administered 2013-03-17: 0.5 mg via RESPIRATORY_TRACT
  Filled 2013-03-17: qty 2.5

## 2013-03-17 MED ORDER — DIGOXIN 0.25 MG/ML IJ SOLN
0.1250 mg | Freq: Every day | INTRAMUSCULAR | Status: DC
  Filled 2013-03-17: qty 0.5

## 2013-03-17 MED ORDER — DIGOXIN 0.1 MG/ML IJ SOLN: 67.0000 ug | Freq: Every day | INTRAMUSCULAR | Status: DC

## 2013-03-18 ENCOUNTER — Telehealth: Payer: Self-pay | Admitting: Internal Medicine

## 2013-03-18 NOTE — Telephone Encounter (Signed)
Will forward to [name] as an FYI 

## 2013-03-18 NOTE — Telephone Encounter (Signed)
I am sorry

## 2013-03-31 ENCOUNTER — Ambulatory Visit: Payer: Medicare Other | Admitting: Internal Medicine

## 2013-04-05 NOTE — Progress Notes (Signed)
[facility] [place]: Sw note  Sw met with Pt's dtr and son-in-law who were at Pt's bedside. They voiced that the felt like Pt was comfortable and not anxious at this time. Pt did appear comfortable and in no distress during Sw visit. Per dtr family's goal is for Pt to be comfortable and under comfort care only. Dtr verbalized her understanding from speaking to the doctor that Pt is dying and is not expected to live days. Dtr stated that she and her sisters have spoken and they wish Pt to remain where she is at this time to die as they feel moving Pt at this time would be "crule to her." Dtr stated her sister has been working on [employer] she does not know if they are complete yet or not. Sw provided active/reflective listening; education about dying process; supportive presence during visit. Sw will continue to follow for support during this hospital stay. [address] [place], [facility] [phone]

## 2013-04-05 NOTE — Progress Notes (Signed)
Patient found  Without respirations, heartbeat, nor BP. Verified by Unk Lightning rn and Kasen Adduci.   [phone]--paged MD

## 2013-04-05 NOTE — Progress Notes (Signed)
[facility] PROGRESS NOTE  [name] ZOX:[id number] DOB: [birth year] DOA: 03/08/2013 PCP: [name], MD  Brief narrative: [name] is an 79 y.o. female with a PMH of aortic stenosis, secondary heart failure from end stage COPD on hospice who presented to the emergency room on 03/12/2013 with 1-2 weeks of progressively worsening shortness of breath, cough and fatigue. Sputum cultures ultimately grew Pseudomonas and the patient has been maintained on IV antibiotics through 03/16/2013. The patient's condition has continued to deteriorate, and family transitioned to full comfort care 03/16/13. She is likely to experience an in-hospital death, and do not feel it's in the patient's best interest to move her at this time given her overall prognosis.  Assessment/Plan: Principal Problem:   COPD exacerbation with acute on chronic respiratory failure secondary to healthcare associated pseudomonal pneumonia -Initially treated with broad-spectrum antibiotics which were narrowed based on sensitivity data after sputum cultures grew Pseudomonas. -Initially treated with Solu-Medrol which has been weaned and discontinued 03/16/2013. -Continue bronchodilator therapy as needed. -Patient now full comfort care. Active Problems:   Atrial fibrillation -Maintaining normal sinus rhythm. Change digoxin to IV route, discontinue sotalol.   Aortic stenosis / pacemaker -Watch for heart failure. No current maintenance IV fluids ordered. Continue to diurese to maintain comfort.   Diastolic CHF, acute on chronic -Stop Lasix.   Leukocytosis -Significantly improved from admission values. No further lab draws planned.   Hyponatremia -Likely SIADH from lung process. Trended up, no further lab draws.   Hyperkalemia / hypokalemia -Corrected.   Steroid-induced hyperglycemia -Discontinue empiric treatment given overall prognosis and desire to focus on comfort measures.   Depression/anxiety -Continue Ativan  drip, titrate to comfort, now at 0.5 mg-hour.   Dyslipidemia -No further treatment.  Code Status: DNR Family Communication: Family at bedside. Disposition Plan: Probable in-hospital death.   Medical Consultants:  None.  Other Consultants:  None.  Anti-infectives:  Rocephin 03/21/2013---> 03/13/2013  Azithromycin 03/06/2013---> 03/13/2013  Zosyn 03/13/2013--->  HPI/Subjective: [name] is no longer responsive and has had significant decline in her condition over the past 24 hours. Her breathing is approaching agonal. No observed periods of apnea yet. Family understands that her prognosis is hours-days. She was restless and agitated last night and her Ativan drip is now at 0.5 mg per hour.  Objective: Filed Vitals:   03/16/13 0800 03/16/13 0806 03/16/13 1345 03/16/13 2218  BP: 157/72  133/54 144/54  Pulse: 75  63 60  Temp:   97.4 F (36.3 C) 97.8 F (36.6 C)  TempSrc:    Axillary  Resp:   18 20  Height:      Weight:      SpO2:  98% 94% 95%    Intake/Output Summary (Last 24 hours) at 03-31-13 0756 Last data filed at 03/16/13 2220  Gross per 24 hour  Intake 261.24 ml  Output   2575 ml  Net -2313.76 ml    Exam: Gen:  NAD Cardiovascular:  RRR, No M/R/G Respiratory:  Lungs clearer with an occasional wheeze. Gastrointestinal:  Abdomen softly distended. Positive bowel sounds. Extremities:  No C/E/C  Data Reviewed:  Basic Metabolic Panel:  Recent Labs Lab 03/12/13 0433 03/13/13 0353 03/14/13 0420 03/15/13 0421 03/16/13 0805  NA 126* 125* 131* 133* 130*  K 5.3* 5.9* 4.2 3.2* 3.8  CL 84* 84* 87* 86* 84*  CO2 35* 33* 40* 43* 43*  GLUCOSE 180* 172* 156* 172* 167*  BUN 21 26* 19 17 19   CREATININE 0.66 0.76 0.57 0.55 0.40*  CALCIUM 9.7 9.4 9.2 8.9 9.0   GFR Estimated Creatinine Clearance: 39.9 ml/min (by C-G formula based on Cr of 0.4).  CBC:  Recent Labs Lab 03/25/2013 1100 03/12/13 0433 03/13/13 0353 03/14/13 0420 03/15/13 0421 03/16/13 0805   WBC 26.4* 25.0* 25.4* 18.4* 17.9* 11.4*  NEUTROABS 24.3*  --   --   --   --   --   HGB 12.2 12.3 10.6* 11.2* 10.8* 11.3*  HCT 37.6 39.7 34.4* 35.8* 35.9* 36.0  MCV 85.1 85.6 86.0 86.7 86.3 86.1  PLT 236 248 228 262 272 293   CBG:  Recent Labs Lab 03/14/13 0734 03/14/13 1139 03/14/13 1701 03/14/13 2155 03/15/13 0815  GLUCAP 148* 214* 107* 164* 169*   Microbiology Recent Results (from the past 240 hour(s))  CULTURE, EXPECTORATED SPUTUM-ASSESSMENT     Status: None   Collection Time    03/12/13 12:40 PM      Result Value Range Status   Specimen Description SPUTUM   Final   Special Requests Normal   Final   Sputum evaluation     Final   Value: THIS SPECIMEN IS ACCEPTABLE. RESPIRATORY CULTURE REPORT TO FOLLOW.   Report Status 03/12/2013 FINAL   Final  CULTURE, RESPIRATORY (NON-EXPECTORATED)     Status: None   Collection Time    03/12/13 12:40 PM      Result Value Range Status   Specimen Description SPUTUM   Final   Special Requests NONE   Final   Gram Stain     Final   Value: ABUNDANT WBC PRESENT,BOTH PMN AND MONONUCLEAR     NO SQUAMOUS EPITHELIAL CELLS SEEN     ABUNDANT GRAM NEGATIVE RODS   Culture ABUNDANT PSEUDOMONAS AERUGINOSA   Final   Report Status 03/16/2013 FINAL   Final   Organism ID, Bacteria PSEUDOMONAS AERUGINOSA   Final      Procedures and Diagnostic Studies: Dg Chest 2 View  03/18/2013  *RADIOLOGY REPORT*  Clinical Data: Cough and chronic lung disease  CHEST - 2 VIEW  Comparison: 01/23/2013  Findings: The cardiac shadow is stable.  A pacing device is again seen.  Persistent changes are noted in the right mid lung stable from prior study.  No new focal abnormality is seen.  Chronic compression deformities in the lower thoracic spine are again noted.  IMPRESSION: Chronic changes in the right. No acute abnormality is seen.   Original Report Authenticated By: Alcide Clever, M.D.     Scheduled Meds: . digoxin  125 mcg Oral QHS  . diphenhydrAMINE  25 mg  Intravenous Once  . feeding supplement  1 Container Oral TID BM  . furosemide  20 mg Intravenous BID  . metoCLOPramide (REGLAN) injection  10 mg Intravenous Once  . sotalol  80 mg Oral BID   Continuous Infusions: . LORazepam (ATIVAN) infusion 0.5 mg/hr (03/23/2013 0518)    Time spent: 25 minutes.   LOS: 6 days   Joslynne Klatt  [facility] Pager [phone].  If 8PM-8AM, please contact night-coverage at [email or web address], password [facility] 03/21/2013, 7:56 AM

## 2013-04-05 NOTE — Progress Notes (Signed)
Room 1337 - [name] - [facility] RN Visit-R.Elliet Goodnow RN  Related admission to [facility] diagnosis of ES COPD.  Pt is DNR code.    Pt obtunded, open mouth breathing, lying in bed, warm to touch, no signs of pain or discomfort.  3 dtrs and 2 of their husbands present. Pt has been started on Ativan drip for increased agitation in early hours this am.   Per meeting with family, Dr. Darnelle Catalan advised pt has hours to days (info has been faxed to [facility] and [facility] SW Kirt Boys advised that hospital SW inquired about [facility] for this pt).  Abx has been discontinued per dtr [name].   Dtrs state last time pt was able to communicate with them was Saturday 5/10.  "Gone from my Sight" book given to dtr Corrie Dandy - other dtrs had a copy and talked about the helpfulness of the book.   Please call [facility] @ [phone]- ask for RN Liaison or after hours,ask for on-call RN with any hospice needs.   Thank you.  Joneen Boers, RN  [facility]  Hospice Liaison

## 2013-04-05 NOTE — Discharge Summary (Signed)
Death Summary  [name] GMW:[id number] DOB: 05/06/1924 DOA: 2013-04-06  PCP: [name], MD PCP/Office notified: 2013-04-12, 4:00 p.m.   Admit date: 04-06-2013 Date of Death: 2013/04/12  Final Diagnoses:  Principal Problem:    COPD exacerbation with acute on chronic respiratory failure, pseudomonal pneumonia Active Problems:    Atrial fibrillation    Aortic stenosis    Pacemaker    HCAP (healthcare-associated pneumonia)    Diastolic CHF, acute on chronic    Leukocytosis    Hyponatremia    Steroid-induced hyperglycemia    Anxiety and depression    Other and unspecified hyperlipidemia  History of present illness:  [name] is an 79 y.o. female with a PMH of aortic stenosis, secondary heart failure from end stage COPD on hospice who presented to the emergency room on April 06, 2013 with 1-2 weeks of progressively worsening shortness of breath, cough and fatigue.  Hospital Course:  [name] was admitted to the hospital and initially put on broad-spectrum antibiotics. Sputum cultures were obtained which ultimately grew Pseudomonas. Her antibiotics were switched to Primaxin based on sensitivity data. Despite appropriate therapy, the patient's condition continued to decline. Her family opted to withdraw antibiotic support on 03/16/2013 and she was made comfortable. She was placed on an IV Ativan drip for comfort purposes and administered intermittent dose morphine. She passed away with her family at the bedside.  Time of death: Approximately 3:30 PM  Signed:  Randale Carvalho  [facility] 2013-04-12, 3:53 PM

## 2013-04-05 NOTE — Progress Notes (Signed)
Support for family at time of patient's death. Presence; prayer.

## 2013-04-05 DEATH — deceased

## 2013-06-22 ENCOUNTER — Encounter: Payer: Self-pay | Admitting: Internal Medicine

## 2013-08-09 IMAGING — CR DG CHEST 2V
2 series · 2 of 2 positions shown · non-contrast
Comparison: 11/01/2011 and earlier studies

CLINICAL DATA: Shortness of breath

CHEST - 2 VIEW

[view not recorded (1 of 2)]
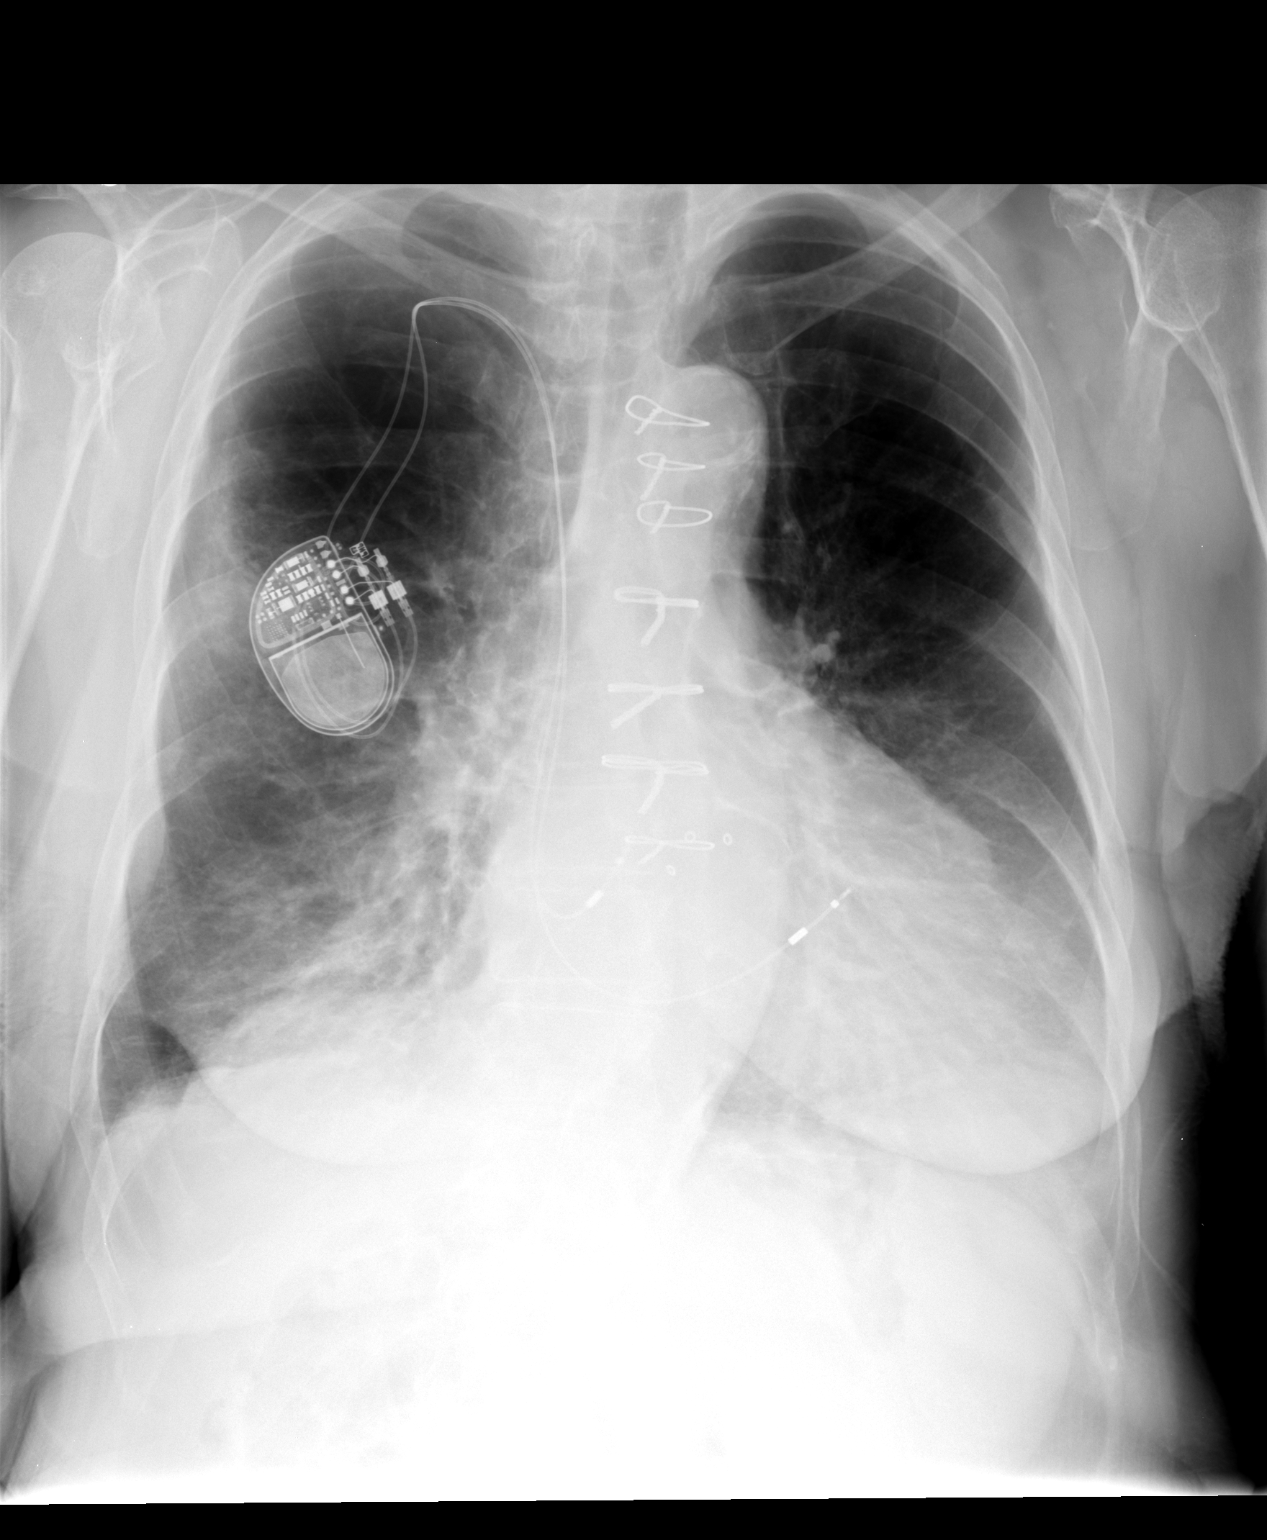

[view not recorded (2 of 2)]
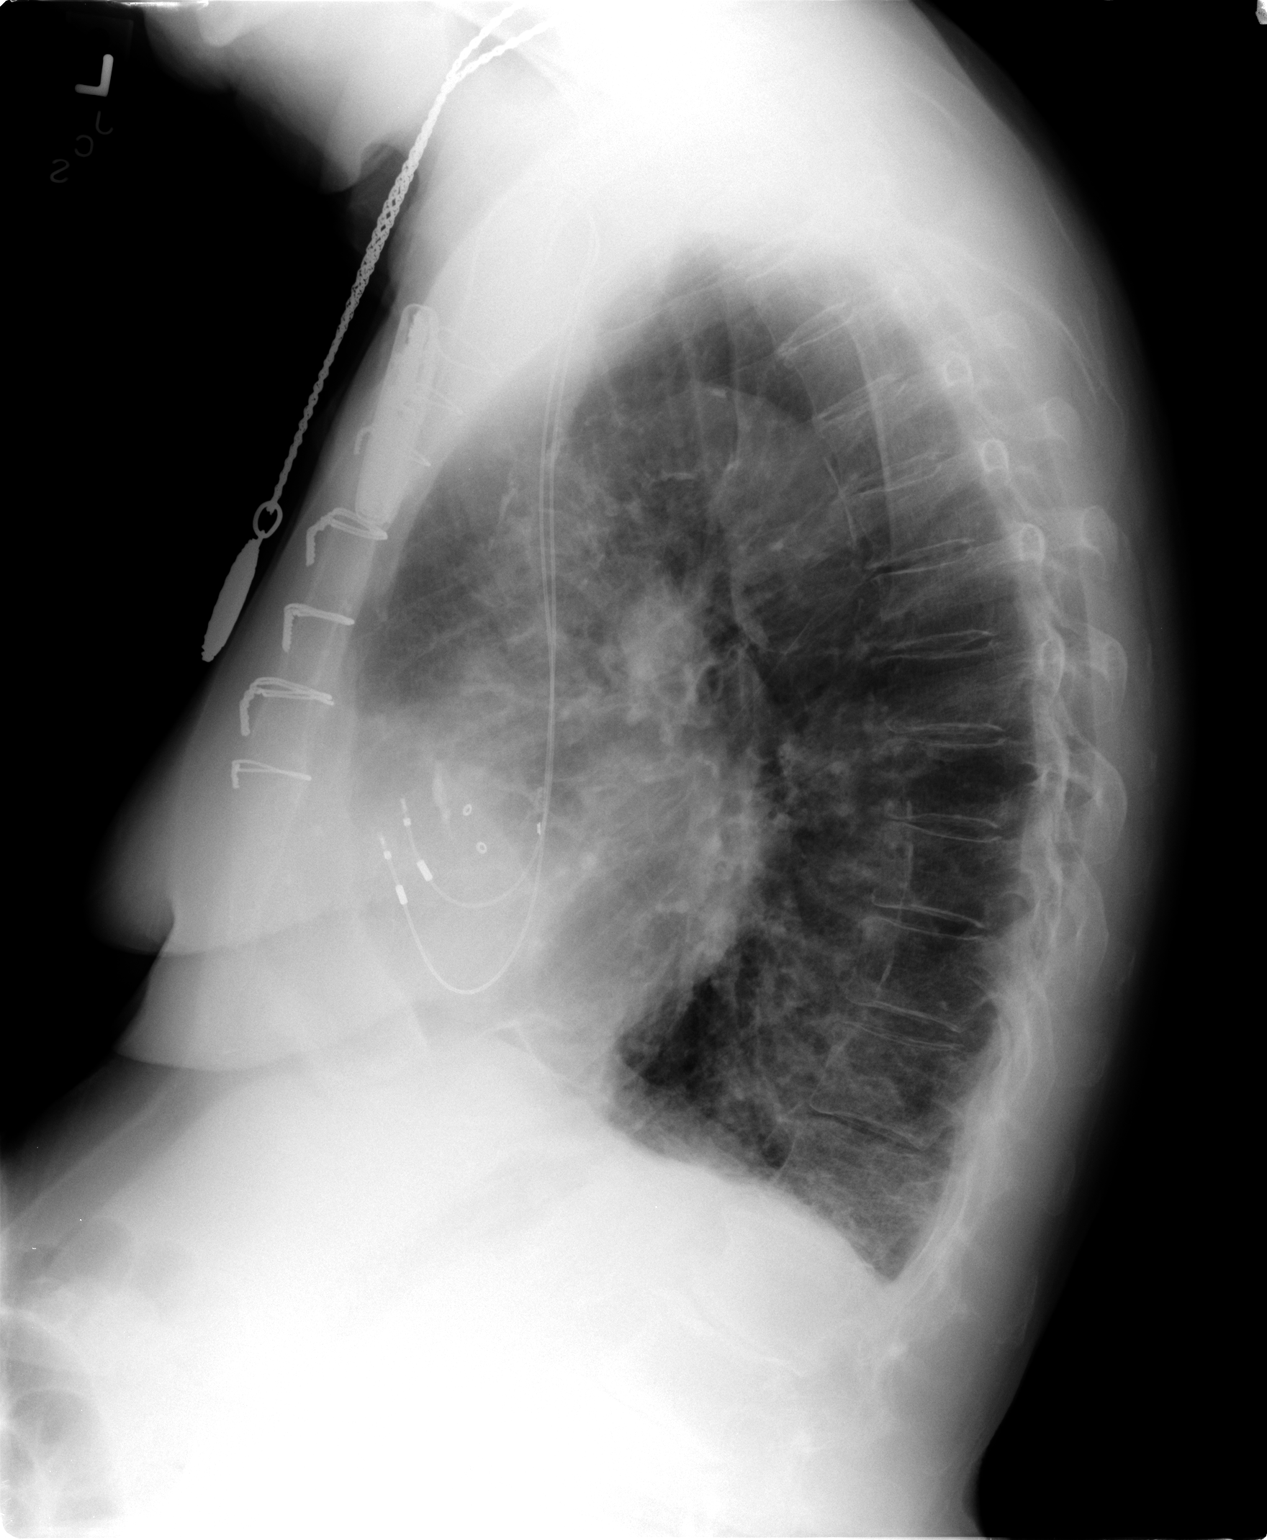

[2 of 2 positions shown; findings below may reference images not displayed]

FINDINGS: Right subclavian pacemaker stable.  Previous CABG.
Stable cardiomegaly.  Patchy peripheral airspace opacity in the
lateral right upper lobe without convincing improvement.  Coarse
perihilar and bibasilar interstitial edema or infiltrates appears
slightly increased.  No definite effusion.  Atheromatous aorta.
IMPRESSION: 1.  Some increase in perihilar and bibasilar infiltrates or edema.
2.  Persistent focal peripheral airspace opacity in the right upper
lobe.
3.  Stable cardiomegaly and postop changes.

## 2013-08-15 IMAGING — US US ABDOMEN LIMITED
1 series · 8 of 8 positions shown · non-contrast
Comparison: None.

CLINICAL DATA: Abdominal distention.  Question ascites.

LIMITED ABDOMINAL ULTRASOUND

[Series 1: us abdomen limited · 0.25mm/px · 8 of 8 slices shown]
[im 1/8]
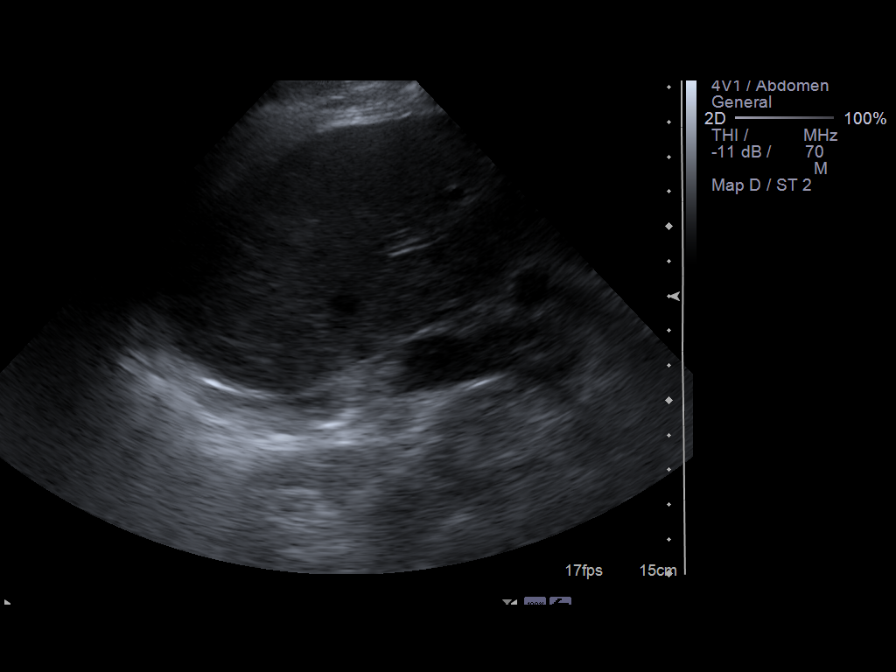
[im 2/8]
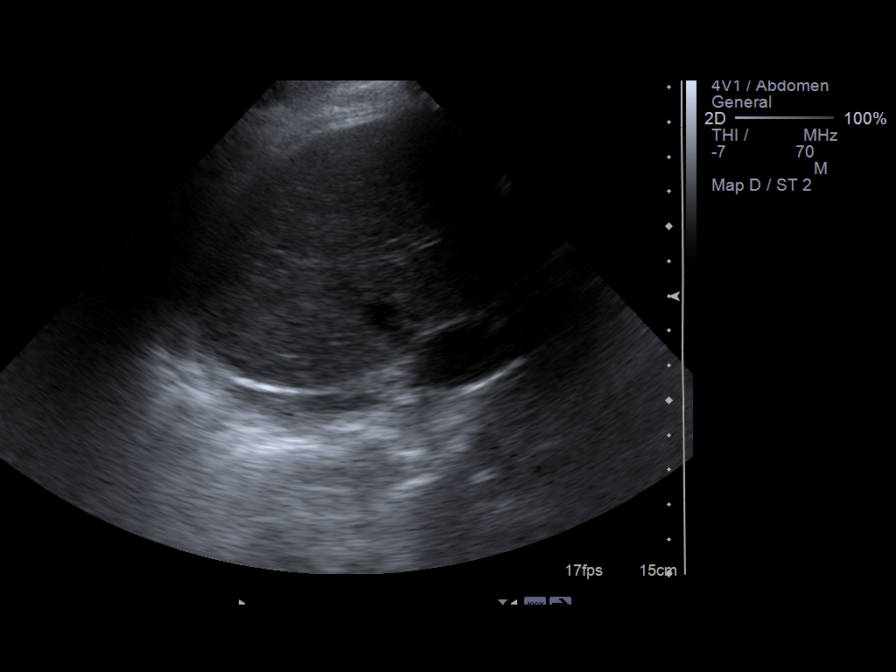
[im 3/8]
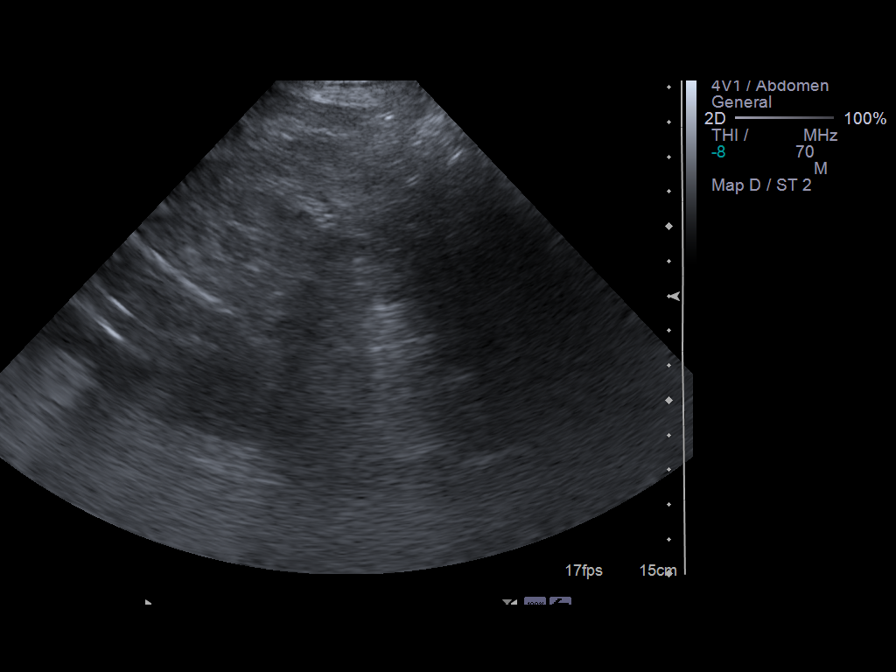
[im 4/8]
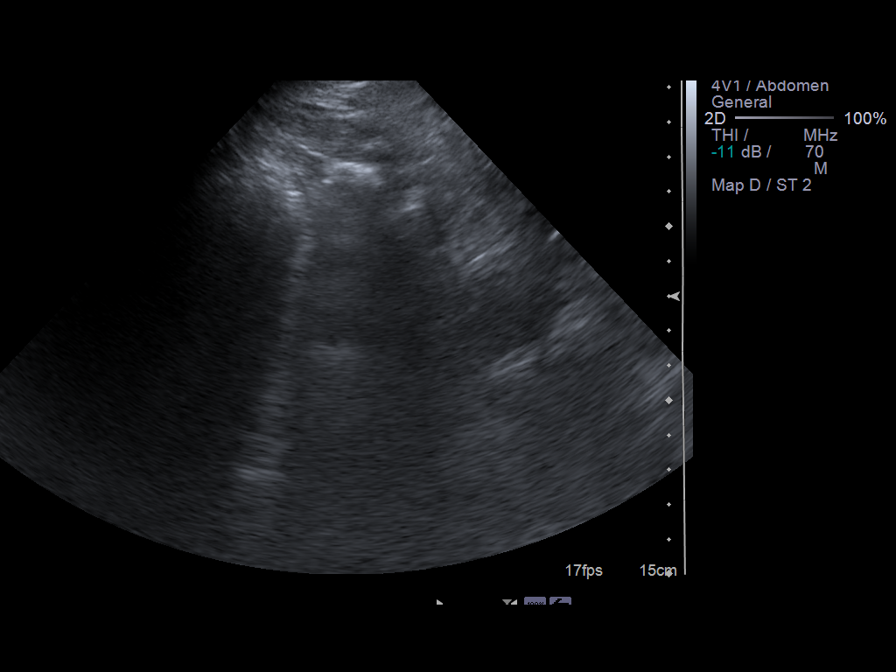
[im 5/8]
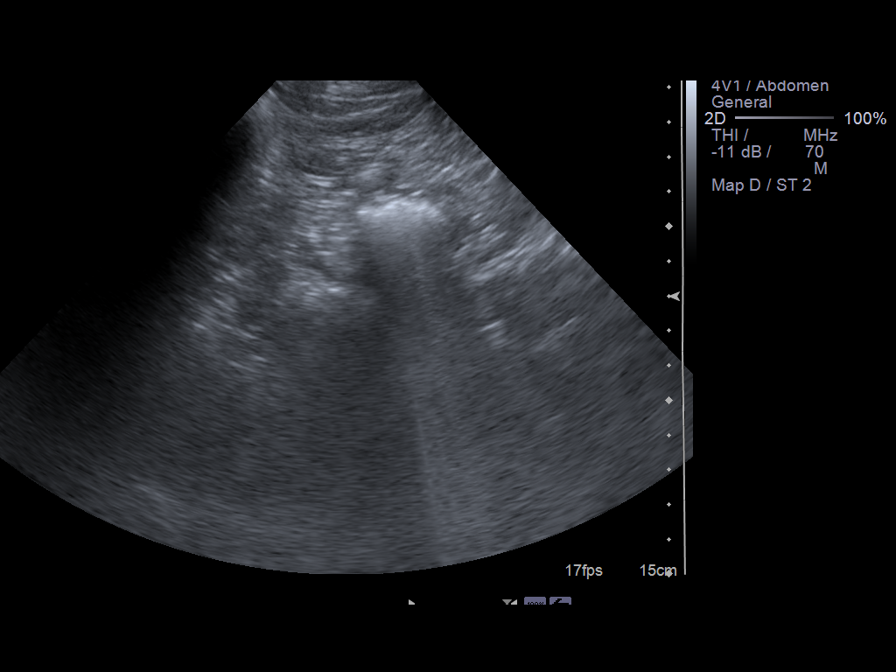
[im 6/8]
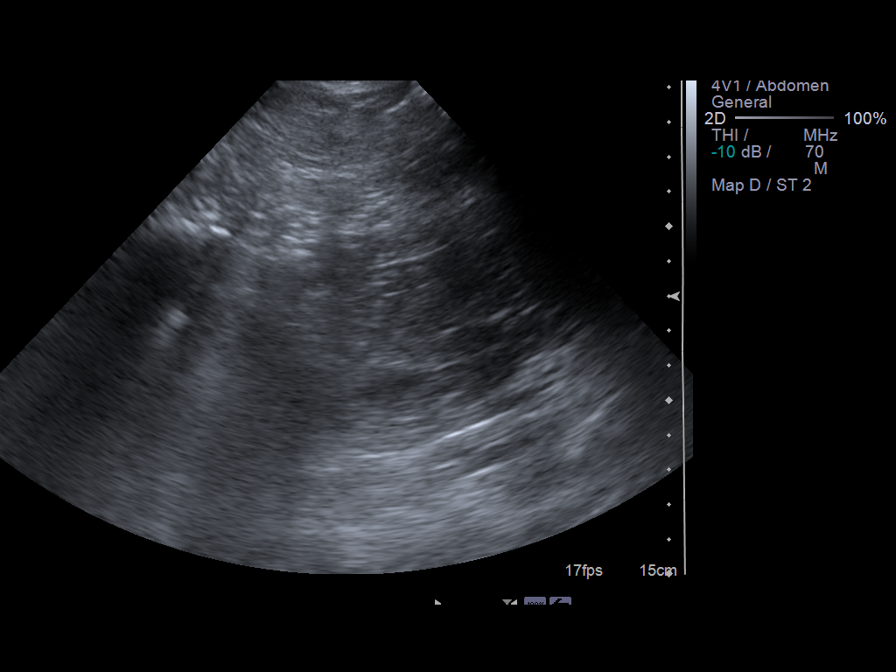
[im 7/8]
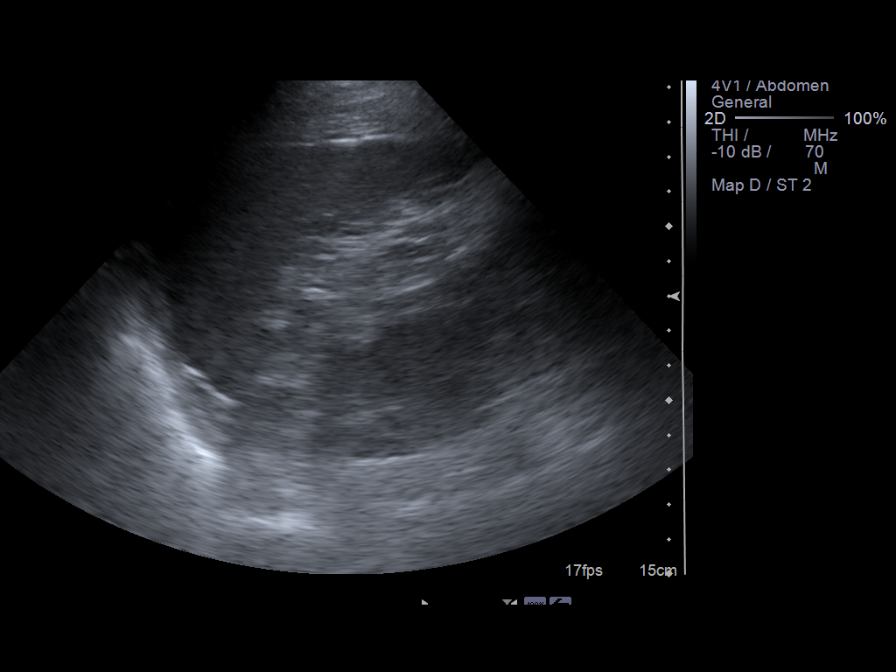
[im 8/8]
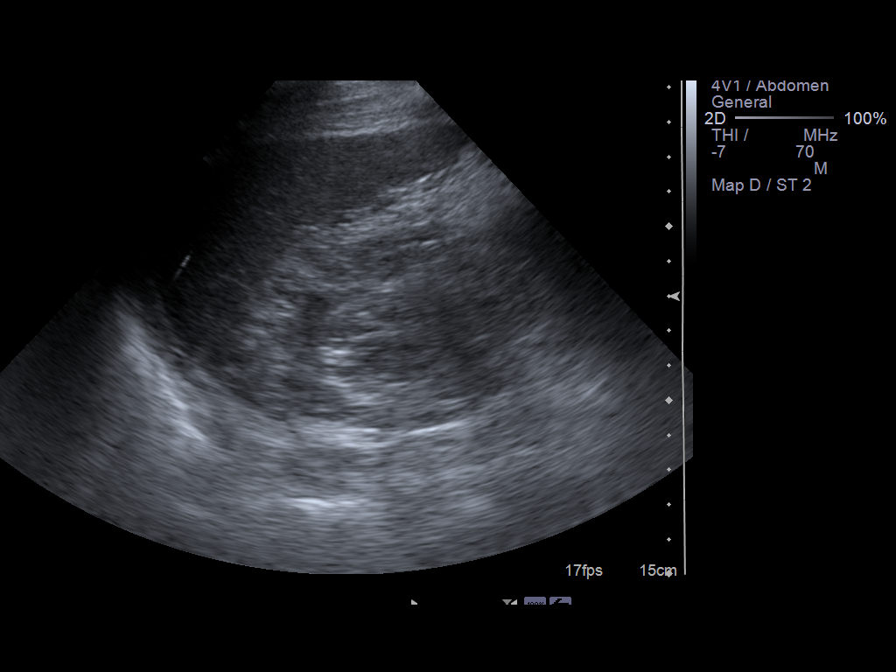

[8 of 8 positions shown; findings below may reference images not displayed]

FINDINGS: Evaluation of the four quadrants of the abdomen
demonstrates no ascites.
IMPRESSION: Negative for ascites.

## 2013-12-25 NOTE — Telephone Encounter (Signed)
error 

## 2014-01-06 IMAGING — CR DG CHEST 2V
2 series · 2 of 2 positions shown · non-contrast
Comparison: 12/17/2011

CLINICAL DATA: Shortness of breath and hemoptysis

CHEST - 2 VIEW

[w chest lat]
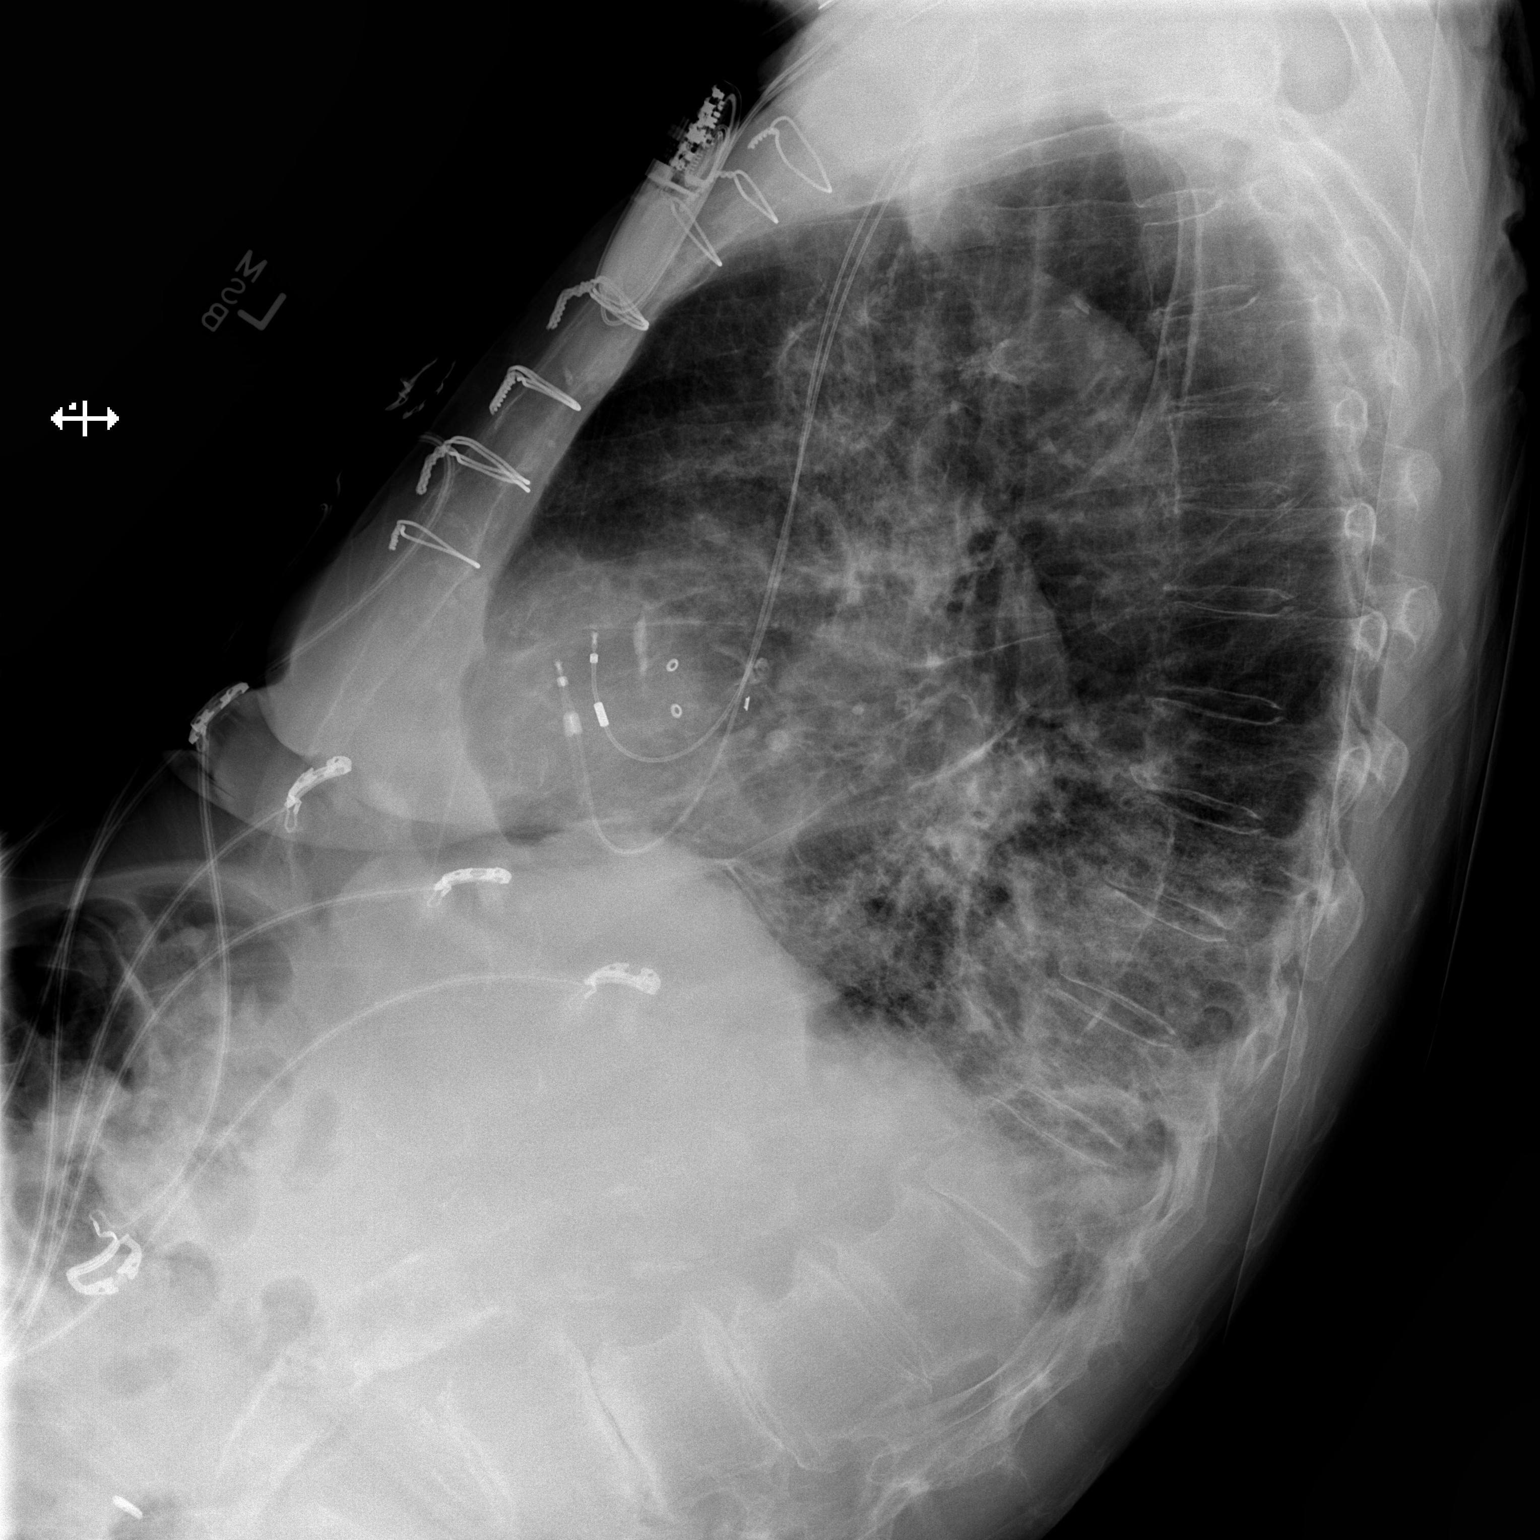

[x chest ap]
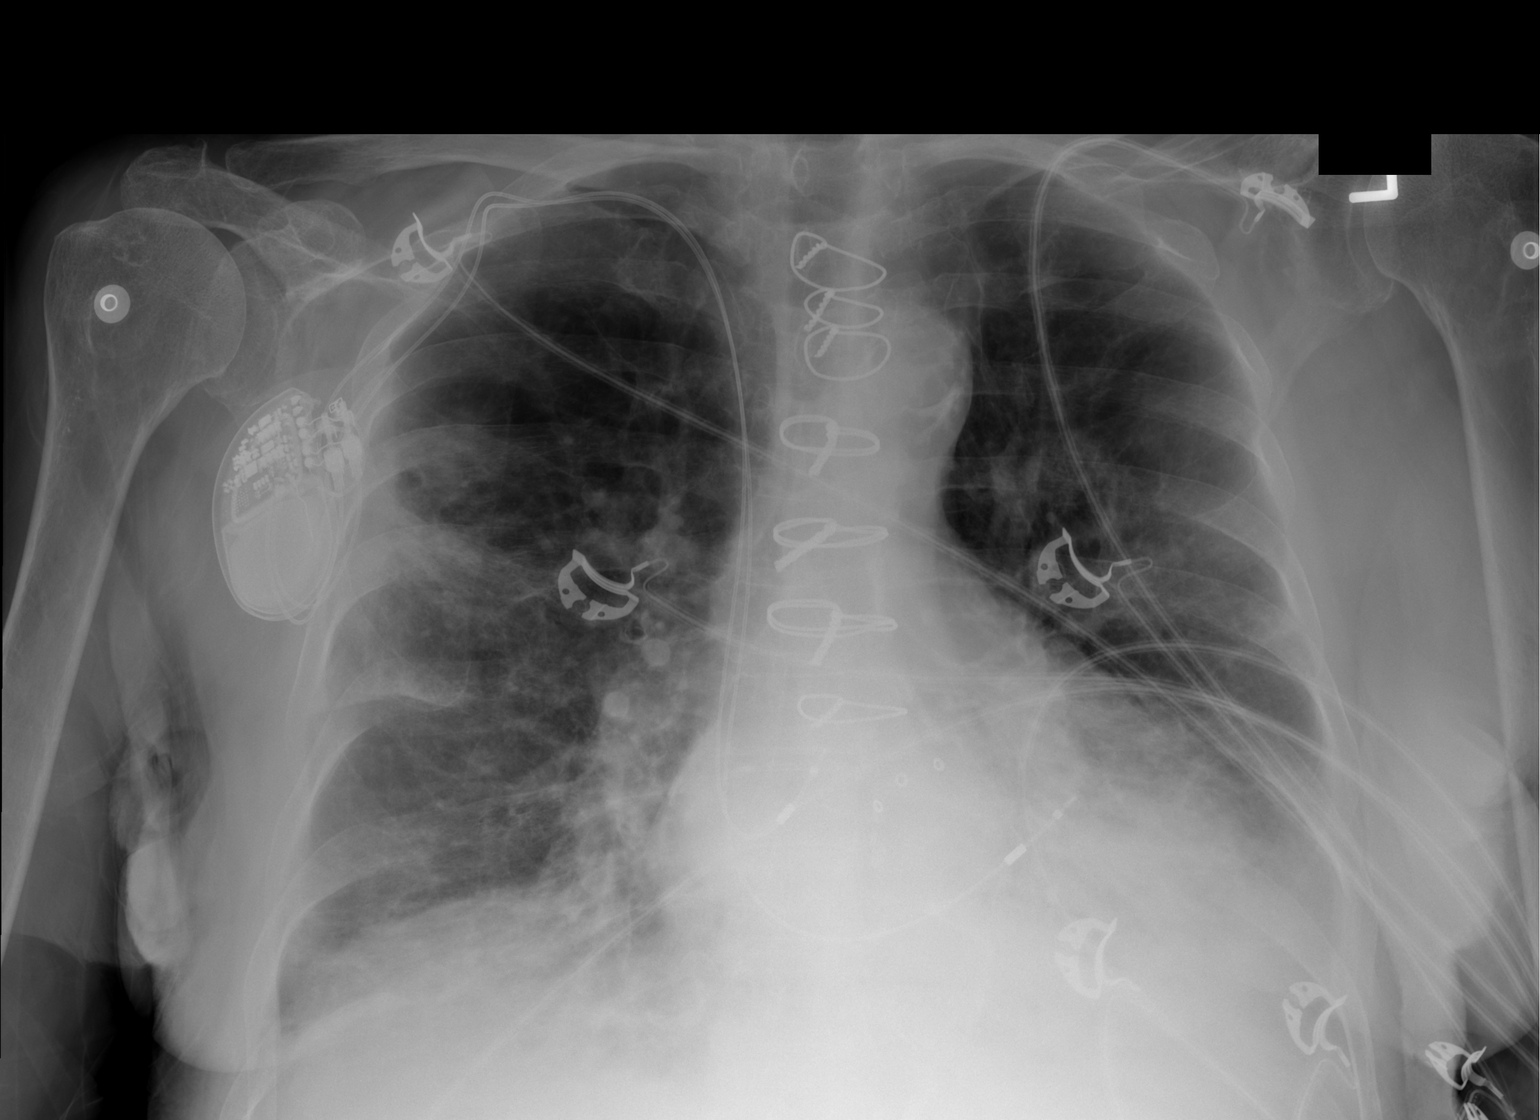

[2 of 2 positions shown; findings below may reference images not displayed]

FINDINGS: There is a right chest wall pacer device with lead in the
right atrial appendage and right ventricle.  Previous median
sternotomy CABG procedure.  There is a left pleural effusion.
Subpleural consolidation in the periphery of the right upper lobe
and airspace disease in the left lower lobe is noted.
IMPRESSION: 1.  Bilateral airspace opacities which may represent multifocal
infection.
2.  Suspect left pleural effusion.

## 2014-09-17 ENCOUNTER — Encounter: Payer: Self-pay | Admitting: Internal Medicine
